# Patient Record
Sex: Male | Born: 1960
Health system: Southern US, Community
[De-identification: ages and names within clinical notes are randomized; demographics above are authoritative.]

## PROBLEM LIST (undated history)

## (undated) DIAGNOSIS — M1990 Unspecified osteoarthritis, unspecified site: Secondary | ICD-10-CM

## (undated) DIAGNOSIS — B20 Human immunodeficiency virus [HIV] disease: Secondary | ICD-10-CM

## (undated) DIAGNOSIS — Z21 Asymptomatic human immunodeficiency virus [HIV] infection status: Secondary | ICD-10-CM

## (undated) DIAGNOSIS — M25552 Pain in left hip: Secondary | ICD-10-CM

## (undated) DIAGNOSIS — E7800 Pure hypercholesterolemia, unspecified: Secondary | ICD-10-CM

## (undated) DIAGNOSIS — Z8739 Personal history of other diseases of the musculoskeletal system and connective tissue: Secondary | ICD-10-CM

## (undated) DIAGNOSIS — I2510 Atherosclerotic heart disease of native coronary artery without angina pectoris: Secondary | ICD-10-CM

## (undated) DIAGNOSIS — E119 Type 2 diabetes mellitus without complications: Secondary | ICD-10-CM

## (undated) DIAGNOSIS — I10 Essential (primary) hypertension: Secondary | ICD-10-CM

## (undated) DIAGNOSIS — D6489 Other specified anemias: Secondary | ICD-10-CM

## (undated) DIAGNOSIS — Z789 Other specified health status: Secondary | ICD-10-CM

## (undated) DIAGNOSIS — B1920 Unspecified viral hepatitis C without hepatic coma: Secondary | ICD-10-CM

## (undated) DIAGNOSIS — D649 Anemia, unspecified: Secondary | ICD-10-CM

## (undated) HISTORY — PX: OTHER SURGICAL HISTORY: SHX169

## (undated) HISTORY — DX: Unspecified viral hepatitis C without hepatic coma: B19.20

## (undated) HISTORY — DX: Essential (primary) hypertension: I10

## (undated) HISTORY — DX: Anemia, unspecified: D64.9

## (undated) HISTORY — DX: Asymptomatic human immunodeficiency virus (hiv) infection status: Z21

## (undated) HISTORY — DX: Human immunodeficiency virus (HIV) disease: B20

## (undated) HISTORY — DX: Atherosclerotic heart disease of native coronary artery without angina pectoris: I25.10

---
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
## 1996-11-06 ENCOUNTER — Encounter (INDEPENDENT_AMBULATORY_CARE_PROVIDER_SITE_OTHER): Payer: Self-pay | Admitting: *Deleted

## 1996-11-06 LAB — CONVERTED CEMR LAB: CD4 Count: 40 microliters

## 1997-09-30 ENCOUNTER — Encounter: Admission: RE | Admit: 1997-09-30 | Discharge: 1997-09-30 | Payer: Self-pay | Admitting: Infectious Diseases

## 1997-10-07 ENCOUNTER — Encounter: Admission: RE | Admit: 1997-10-07 | Discharge: 1997-10-07 | Payer: Self-pay | Admitting: Infectious Diseases

## 1997-11-25 ENCOUNTER — Encounter: Admission: RE | Admit: 1997-11-25 | Discharge: 1997-11-25 | Payer: Self-pay | Admitting: Infectious Diseases

## 1998-01-18 ENCOUNTER — Encounter: Admission: RE | Admit: 1998-01-18 | Discharge: 1998-01-18 | Payer: Self-pay | Admitting: Infectious Diseases

## 1998-01-26 ENCOUNTER — Emergency Department (HOSPITAL_COMMUNITY): Admission: EM | Admit: 1998-01-26 | Discharge: 1998-01-26 | Payer: Self-pay | Admitting: Emergency Medicine

## 1998-03-08 ENCOUNTER — Encounter: Admission: RE | Admit: 1998-03-08 | Discharge: 1998-03-08 | Payer: Self-pay | Admitting: Infectious Diseases

## 1998-05-04 ENCOUNTER — Encounter: Admission: RE | Admit: 1998-05-04 | Discharge: 1998-05-04 | Payer: Self-pay | Admitting: Infectious Diseases

## 1998-05-24 ENCOUNTER — Inpatient Hospital Stay (HOSPITAL_COMMUNITY): Admission: EM | Admit: 1998-05-24 | Discharge: 1998-05-29 | Payer: Self-pay | Admitting: Emergency Medicine

## 1998-05-24 ENCOUNTER — Encounter: Payer: Self-pay | Admitting: Emergency Medicine

## 1998-07-01 ENCOUNTER — Encounter: Admission: RE | Admit: 1998-07-01 | Discharge: 1998-07-01 | Payer: Self-pay | Admitting: Infectious Diseases

## 1998-08-24 ENCOUNTER — Encounter: Admission: RE | Admit: 1998-08-24 | Discharge: 1998-08-24 | Payer: Self-pay | Admitting: Infectious Diseases

## 1998-08-30 ENCOUNTER — Encounter: Admission: RE | Admit: 1998-08-30 | Discharge: 1998-08-30 | Payer: Self-pay | Admitting: Infectious Diseases

## 1998-10-21 ENCOUNTER — Encounter: Admission: RE | Admit: 1998-10-21 | Discharge: 1998-10-21 | Payer: Self-pay | Admitting: Internal Medicine

## 1998-10-21 ENCOUNTER — Encounter: Admission: RE | Admit: 1998-10-21 | Discharge: 1998-10-21 | Payer: Self-pay | Admitting: Infectious Diseases

## 1998-12-16 ENCOUNTER — Encounter: Admission: RE | Admit: 1998-12-16 | Discharge: 1998-12-16 | Payer: Self-pay | Admitting: Infectious Diseases

## 1999-02-07 ENCOUNTER — Encounter: Admission: RE | Admit: 1999-02-07 | Discharge: 1999-02-07 | Payer: Self-pay | Admitting: Infectious Diseases

## 1999-03-18 ENCOUNTER — Encounter: Admission: RE | Admit: 1999-03-18 | Discharge: 1999-03-18 | Payer: Self-pay | Admitting: Infectious Diseases

## 1999-04-04 ENCOUNTER — Encounter: Admission: RE | Admit: 1999-04-04 | Discharge: 1999-04-04 | Payer: Self-pay | Admitting: Infectious Diseases

## 1999-04-28 ENCOUNTER — Encounter: Admission: RE | Admit: 1999-04-28 | Discharge: 1999-04-28 | Payer: Self-pay | Admitting: Infectious Diseases

## 1999-05-30 ENCOUNTER — Encounter: Admission: RE | Admit: 1999-05-30 | Discharge: 1999-05-30 | Payer: Self-pay | Admitting: Infectious Diseases

## 1999-07-25 ENCOUNTER — Encounter: Admission: RE | Admit: 1999-07-25 | Discharge: 1999-07-25 | Payer: Self-pay | Admitting: Infectious Diseases

## 1999-09-26 ENCOUNTER — Encounter: Admission: RE | Admit: 1999-09-26 | Discharge: 1999-09-26 | Payer: Self-pay | Admitting: Infectious Diseases

## 1999-11-15 ENCOUNTER — Encounter: Admission: RE | Admit: 1999-11-15 | Discharge: 1999-11-15 | Payer: Self-pay | Admitting: Internal Medicine

## 2000-01-05 ENCOUNTER — Encounter: Admission: RE | Admit: 2000-01-05 | Discharge: 2000-01-05 | Payer: Self-pay | Admitting: Infectious Diseases

## 2000-01-05 ENCOUNTER — Ambulatory Visit (HOSPITAL_COMMUNITY): Admission: RE | Admit: 2000-01-05 | Discharge: 2000-01-05 | Payer: Self-pay | Admitting: Infectious Diseases

## 2000-01-05 ENCOUNTER — Encounter (INDEPENDENT_AMBULATORY_CARE_PROVIDER_SITE_OTHER): Payer: Self-pay | Admitting: *Deleted

## 2000-01-05 LAB — CONVERTED CEMR LAB
CD4 Count: 270 microliters
HIV 1 RNA Quant: 49 copies/mL

## 2000-08-13 ENCOUNTER — Encounter: Admission: RE | Admit: 2000-08-13 | Discharge: 2000-08-13 | Payer: Self-pay | Admitting: Infectious Diseases

## 2000-08-13 ENCOUNTER — Encounter (INDEPENDENT_AMBULATORY_CARE_PROVIDER_SITE_OTHER): Payer: Self-pay | Admitting: *Deleted

## 2000-08-13 ENCOUNTER — Ambulatory Visit (HOSPITAL_COMMUNITY): Admission: RE | Admit: 2000-08-13 | Discharge: 2000-08-13 | Payer: Self-pay | Admitting: Infectious Diseases

## 2000-08-13 LAB — CONVERTED CEMR LAB
CD4 Count: 380 microliters
HIV 1 RNA Quant: 400 copies/mL

## 2001-02-27 ENCOUNTER — Encounter: Payer: Self-pay | Admitting: Internal Medicine

## 2001-02-27 ENCOUNTER — Emergency Department (HOSPITAL_COMMUNITY): Admission: EM | Admit: 2001-02-27 | Discharge: 2001-02-27 | Payer: Self-pay | Admitting: Emergency Medicine

## 2001-04-08 ENCOUNTER — Emergency Department (HOSPITAL_COMMUNITY): Admission: EM | Admit: 2001-04-08 | Discharge: 2001-04-08 | Payer: Self-pay | Admitting: Emergency Medicine

## 2001-04-08 ENCOUNTER — Encounter: Payer: Self-pay | Admitting: Emergency Medicine

## 2002-11-27 ENCOUNTER — Other Ambulatory Visit: Admission: RE | Admit: 2002-11-27 | Discharge: 2002-11-27 | Payer: Self-pay | Admitting: Internal Medicine

## 2004-07-26 ENCOUNTER — Ambulatory Visit: Payer: Self-pay | Admitting: Internal Medicine

## 2004-07-27 ENCOUNTER — Ambulatory Visit: Payer: Self-pay | Admitting: Nurse Practitioner

## 2004-08-11 ENCOUNTER — Ambulatory Visit: Payer: Self-pay | Admitting: Internal Medicine

## 2004-08-22 ENCOUNTER — Ambulatory Visit: Payer: Self-pay | Admitting: Internal Medicine

## 2005-01-28 ENCOUNTER — Emergency Department (HOSPITAL_COMMUNITY): Admission: EM | Admit: 2005-01-28 | Discharge: 2005-01-28 | Payer: Self-pay | Admitting: Emergency Medicine

## 2005-02-21 ENCOUNTER — Emergency Department (HOSPITAL_COMMUNITY): Admission: EM | Admit: 2005-02-21 | Discharge: 2005-02-21 | Payer: Self-pay | Admitting: Family Medicine

## 2005-09-13 ENCOUNTER — Ambulatory Visit: Payer: Self-pay | Admitting: Internal Medicine

## 2005-09-13 ENCOUNTER — Encounter: Admission: RE | Admit: 2005-09-13 | Discharge: 2005-09-13 | Payer: Self-pay | Admitting: Internal Medicine

## 2005-09-13 ENCOUNTER — Ambulatory Visit (HOSPITAL_COMMUNITY): Admission: RE | Admit: 2005-09-13 | Discharge: 2005-09-13 | Payer: Self-pay | Admitting: Internal Medicine

## 2005-09-28 ENCOUNTER — Ambulatory Visit: Payer: Self-pay | Admitting: Internal Medicine

## 2006-08-01 ENCOUNTER — Emergency Department (HOSPITAL_COMMUNITY): Admission: EM | Admit: 2006-08-01 | Discharge: 2006-08-01 | Payer: Self-pay | Admitting: Emergency Medicine

## 2006-08-06 ENCOUNTER — Encounter: Admission: RE | Admit: 2006-08-06 | Discharge: 2006-08-06 | Payer: Self-pay | Admitting: Internal Medicine

## 2006-08-06 ENCOUNTER — Encounter (INDEPENDENT_AMBULATORY_CARE_PROVIDER_SITE_OTHER): Payer: Self-pay | Admitting: *Deleted

## 2006-08-06 ENCOUNTER — Ambulatory Visit: Payer: Self-pay | Admitting: Internal Medicine

## 2006-08-06 LAB — CONVERTED CEMR LAB
ALT: 60 units/L — ABNORMAL HIGH (ref 0–53)
Albumin: 4 g/dL (ref 3.5–5.2)
Alkaline Phosphatase: 83 units/L (ref 39–117)
Basophils Absolute: 0 10*3/uL (ref 0.0–0.1)
Bilirubin Urine: NEGATIVE
CD4 Count: 110 microliters
Eosinophils Absolute: 0.1 10*3/uL (ref 0.0–0.7)
GC Probe Amp, Urine: NEGATIVE
Glucose, Bld: 112 mg/dL — ABNORMAL HIGH (ref 70–99)
HCV Ab: POSITIVE — AB
HIV 1 RNA Quant: 49 copies/mL
Hemoglobin, Urine: NEGATIVE
Hep B Core Total Ab: POSITIVE — AB
Hep B S Ab: POSITIVE — AB
Hepatitis B Surface Ag: NEGATIVE
Ketones, ur: NEGATIVE mg/dL
LDL Cholesterol: 92 mg/dL (ref 0–99)
Lymphocytes Relative: 23 % (ref 12–46)
Lymphs Abs: 0.8 10*3/uL (ref 0.7–3.3)
MCV: 90 fL (ref 78.0–100.0)
Neutrophils Relative %: 65 % (ref 43–77)
Platelets: 264 10*3/uL (ref 150–400)
Potassium: 3.8 meq/L (ref 3.5–5.3)
Protein, ur: NEGATIVE mg/dL
Sodium: 140 meq/L (ref 135–145)
Total Protein: 7.3 g/dL (ref 6.0–8.3)
Triglycerides: 198 mg/dL — ABNORMAL HIGH (ref <150)
Urine Glucose: NEGATIVE mg/dL
Urobilinogen, UA: 0.2 (ref 0.0–1.0)
WBC: 3.7 10*3/uL — ABNORMAL LOW (ref 4.0–10.5)

## 2006-08-07 ENCOUNTER — Encounter: Payer: Self-pay | Admitting: Internal Medicine

## 2006-08-07 LAB — CONVERTED CEMR LAB: Hep A Total Ab: NEGATIVE

## 2006-08-09 DIAGNOSIS — B20 Human immunodeficiency virus [HIV] disease: Secondary | ICD-10-CM | POA: Insufficient documentation

## 2006-08-09 DIAGNOSIS — Z8619 Personal history of other infectious and parasitic diseases: Secondary | ICD-10-CM | POA: Insufficient documentation

## 2006-08-09 DIAGNOSIS — I10 Essential (primary) hypertension: Secondary | ICD-10-CM

## 2006-08-09 DIAGNOSIS — B1710 Acute hepatitis C without hepatic coma: Secondary | ICD-10-CM

## 2006-08-13 ENCOUNTER — Encounter (INDEPENDENT_AMBULATORY_CARE_PROVIDER_SITE_OTHER): Payer: Self-pay | Admitting: *Deleted

## 2006-08-13 LAB — CONVERTED CEMR LAB

## 2006-08-17 ENCOUNTER — Encounter: Payer: Self-pay | Admitting: Internal Medicine

## 2006-08-26 ENCOUNTER — Encounter (INDEPENDENT_AMBULATORY_CARE_PROVIDER_SITE_OTHER): Payer: Self-pay | Admitting: *Deleted

## 2006-09-10 ENCOUNTER — Telehealth: Payer: Self-pay | Admitting: Internal Medicine

## 2006-09-12 ENCOUNTER — Encounter (INDEPENDENT_AMBULATORY_CARE_PROVIDER_SITE_OTHER): Payer: Self-pay | Admitting: *Deleted

## 2006-09-12 ENCOUNTER — Ambulatory Visit: Payer: Self-pay | Admitting: Internal Medicine

## 2006-09-12 ENCOUNTER — Telehealth: Payer: Self-pay | Admitting: Internal Medicine

## 2006-09-12 DIAGNOSIS — F528 Other sexual dysfunction not due to a substance or known physiological condition: Secondary | ICD-10-CM

## 2006-09-25 ENCOUNTER — Telehealth: Payer: Self-pay | Admitting: Internal Medicine

## 2007-03-26 ENCOUNTER — Telehealth: Payer: Self-pay | Admitting: Infectious Diseases

## 2007-05-21 ENCOUNTER — Telehealth: Payer: Self-pay | Admitting: Internal Medicine

## 2007-06-18 ENCOUNTER — Encounter: Payer: Self-pay | Admitting: Internal Medicine

## 2007-06-19 ENCOUNTER — Encounter (INDEPENDENT_AMBULATORY_CARE_PROVIDER_SITE_OTHER): Payer: Self-pay | Admitting: *Deleted

## 2007-06-25 ENCOUNTER — Ambulatory Visit: Payer: Self-pay | Admitting: Internal Medicine

## 2007-06-25 ENCOUNTER — Encounter: Admission: RE | Admit: 2007-06-25 | Discharge: 2007-06-25 | Payer: Self-pay | Admitting: Internal Medicine

## 2007-06-25 LAB — CONVERTED CEMR LAB
AST: 38 units/L — ABNORMAL HIGH (ref 0–37)
Albumin: 4.5 g/dL (ref 3.5–5.2)
Alkaline Phosphatase: 72 units/L (ref 39–117)
BUN: 15 mg/dL (ref 6–23)
Basophils Relative: 1 % (ref 0–1)
Calcium: 9 mg/dL (ref 8.4–10.5)
Chloride: 104 meq/L (ref 96–112)
Creatinine, Ser: 1.05 mg/dL (ref 0.40–1.50)
Eosinophils Absolute: 0.1 10*3/uL (ref 0.0–0.7)
Glucose, Bld: 115 mg/dL — ABNORMAL HIGH (ref 70–99)
HDL: 57 mg/dL (ref >39)
HIV-1 RNA Quant, Log: <1.7 (ref <1.70)
Hemoglobin: 16.2 g/dL (ref 13.0–17.0)
Lymphs Abs: 0.9 10*3/uL (ref 0.7–4.0)
MCHC: 35.4 g/dL (ref 30.0–36.0)
MCV: 90.9 fL (ref 78.0–100.0)
Monocytes Absolute: 0.3 10*3/uL (ref 0.1–1.0)
Monocytes Relative: 10 % (ref 3–12)
Potassium: 4.1 meq/L (ref 3.5–5.3)
RBC: 5.04 M/uL (ref 4.22–5.81)
Testosterone: 956.07 ng/dL — ABNORMAL HIGH (ref 350–890)
Total CHOL/HDL Ratio: 3.5
Triglycerides: 265 mg/dL — ABNORMAL HIGH (ref <150)
WBC: 2.7 10*3/uL — ABNORMAL LOW (ref 4.0–10.5)

## 2007-07-17 ENCOUNTER — Ambulatory Visit: Payer: Self-pay | Admitting: Internal Medicine

## 2007-10-07 ENCOUNTER — Encounter (INDEPENDENT_AMBULATORY_CARE_PROVIDER_SITE_OTHER): Payer: Self-pay | Admitting: *Deleted

## 2007-10-18 ENCOUNTER — Encounter: Admission: RE | Admit: 2007-10-18 | Discharge: 2007-10-18 | Payer: Self-pay | Admitting: Internal Medicine

## 2007-10-18 ENCOUNTER — Ambulatory Visit: Payer: Self-pay | Admitting: Internal Medicine

## 2007-10-18 LAB — CONVERTED CEMR LAB
Alkaline Phosphatase: 82 units/L (ref 39–117)
BUN: 15 mg/dL (ref 6–23)
CO2: 24 meq/L (ref 19–32)
Cholesterol: 201 mg/dL — ABNORMAL HIGH (ref 0–200)
Creatinine, Ser: 1.07 mg/dL (ref 0.40–1.50)
Eosinophils Absolute: 0.2 10*3/uL (ref 0.0–0.7)
Eosinophils Relative: 4 % (ref 0–5)
Glucose, Bld: 158 mg/dL — ABNORMAL HIGH (ref 70–99)
HCT: 47.3 % (ref 39.0–52.0)
HDL: 62 mg/dL (ref >39)
Hemoglobin: 17.2 g/dL — ABNORMAL HIGH (ref 13.0–17.0)
Lymphocytes Relative: 28 % (ref 12–46)
Lymphs Abs: 1.1 10*3/uL (ref 0.7–4.0)
MCV: 88.9 fL (ref 78.0–100.0)
Monocytes Absolute: 0.4 10*3/uL (ref 0.1–1.0)
Monocytes Relative: 11 % (ref 3–12)
Total Bilirubin: 0.9 mg/dL (ref 0.3–1.2)
Total CHOL/HDL Ratio: 3.2
Total Protein: 7.8 g/dL (ref 6.0–8.3)
Triglycerides: 221 mg/dL — ABNORMAL HIGH (ref <150)
VLDL: 44 mg/dL — ABNORMAL HIGH (ref 0–40)
WBC: 3.8 10*3/uL — ABNORMAL LOW (ref 4.0–10.5)

## 2008-05-21 ENCOUNTER — Encounter (INDEPENDENT_AMBULATORY_CARE_PROVIDER_SITE_OTHER): Payer: Self-pay | Admitting: Licensed Clinical Social Worker

## 2008-05-21 ENCOUNTER — Ambulatory Visit: Payer: Self-pay | Admitting: Internal Medicine

## 2008-05-21 LAB — CONVERTED CEMR LAB
AST: 58 units/L — ABNORMAL HIGH (ref 0–37)
Alkaline Phosphatase: 61 units/L (ref 39–117)
BUN: 19 mg/dL (ref 6–23)
Basophils Relative: 1 % (ref 0–1)
Eosinophils Absolute: 0.1 10*3/uL (ref 0.0–0.7)
Eosinophils Relative: 3 % (ref 0–5)
Glucose, Bld: 121 mg/dL — ABNORMAL HIGH (ref 70–99)
HCT: 43.7 % (ref 39.0–52.0)
HIV-1 RNA Quant, Log: 2.15 — ABNORMAL HIGH (ref <1.68)
Lymphs Abs: 1.1 10*3/uL (ref 0.7–4.0)
MCHC: 33.6 g/dL (ref 30.0–36.0)
MCV: 93.4 fL (ref 78.0–100.0)
Monocytes Relative: 13 % — ABNORMAL HIGH (ref 3–12)
Platelets: 238 10*3/uL (ref 150–400)
Sodium: 139 meq/L (ref 135–145)
Total Bilirubin: 0.5 mg/dL (ref 0.3–1.2)
WBC: 3.2 10*3/uL — ABNORMAL LOW (ref 4.0–10.5)

## 2008-06-05 ENCOUNTER — Ambulatory Visit: Payer: Self-pay | Admitting: Internal Medicine

## 2008-06-24 ENCOUNTER — Telehealth (INDEPENDENT_AMBULATORY_CARE_PROVIDER_SITE_OTHER): Payer: Self-pay | Admitting: *Deleted

## 2008-09-09 ENCOUNTER — Ambulatory Visit: Payer: Self-pay | Admitting: Internal Medicine

## 2008-09-09 LAB — CONVERTED CEMR LAB
AST: 52 units/L — ABNORMAL HIGH (ref 0–37)
Albumin: 4.6 g/dL (ref 3.5–5.2)
BUN: 17 mg/dL (ref 6–23)
CO2: 24 meq/L (ref 19–32)
Calcium: 9.4 mg/dL (ref 8.4–10.5)
Chloride: 104 meq/L (ref 96–112)
Eosinophils Relative: 3 % (ref 0–5)
Glucose, Bld: 119 mg/dL — ABNORMAL HIGH (ref 70–99)
HCT: 44.2 % (ref 39.0–52.0)
HIV-1 RNA Quant, Log: 1.92 — ABNORMAL HIGH (ref <1.68)
Hemoglobin: 15.6 g/dL (ref 13.0–17.0)
Lymphocytes Relative: 37 % (ref 12–46)
Lymphs Abs: 1.2 10*3/uL (ref 0.7–4.0)
Monocytes Absolute: 0.4 10*3/uL (ref 0.1–1.0)
Monocytes Relative: 12 % (ref 3–12)
Potassium: 4.2 meq/L (ref 3.5–5.3)
WBC: 3.3 10*3/uL — ABNORMAL LOW (ref 4.0–10.5)

## 2008-09-10 ENCOUNTER — Encounter (INDEPENDENT_AMBULATORY_CARE_PROVIDER_SITE_OTHER): Payer: Self-pay | Admitting: *Deleted

## 2008-09-29 ENCOUNTER — Encounter (INDEPENDENT_AMBULATORY_CARE_PROVIDER_SITE_OTHER): Payer: Self-pay | Admitting: *Deleted

## 2008-10-16 ENCOUNTER — Telehealth (INDEPENDENT_AMBULATORY_CARE_PROVIDER_SITE_OTHER): Payer: Self-pay | Admitting: *Deleted

## 2008-10-22 ENCOUNTER — Telehealth (INDEPENDENT_AMBULATORY_CARE_PROVIDER_SITE_OTHER): Payer: Self-pay | Admitting: *Deleted

## 2008-12-29 ENCOUNTER — Telehealth (INDEPENDENT_AMBULATORY_CARE_PROVIDER_SITE_OTHER): Payer: Self-pay | Admitting: *Deleted

## 2009-01-04 ENCOUNTER — Telehealth (INDEPENDENT_AMBULATORY_CARE_PROVIDER_SITE_OTHER): Payer: Self-pay | Admitting: *Deleted

## 2009-01-27 ENCOUNTER — Ambulatory Visit: Payer: Self-pay | Admitting: Internal Medicine

## 2009-01-27 LAB — CONVERTED CEMR LAB
ALT: 55 units/L — ABNORMAL HIGH (ref 0–53)
AST: 47 units/L — ABNORMAL HIGH (ref 0–37)
Albumin: 4.4 g/dL (ref 3.5–5.2)
Alkaline Phosphatase: 78 units/L (ref 39–117)
Basophils Relative: 1 % (ref 0–1)
CO2: 23 meq/L (ref 19–32)
Chloride: 103 meq/L (ref 96–112)
Cholesterol: 194 mg/dL (ref 0–200)
Creatinine, Ser: 0.97 mg/dL (ref 0.40–1.50)
Eosinophils Absolute: 0 10*3/uL (ref 0.0–0.7)
HIV 1 RNA Quant: 117 copies/mL — ABNORMAL HIGH (ref <48)
Hemoglobin: 16.2 g/dL (ref 13.0–17.0)
LDL Cholesterol: 87 mg/dL (ref 0–99)
Lymphs Abs: 0.8 10*3/uL (ref 0.7–4.0)
MCV: 91.3 fL (ref >=78.0)
Neutrophils Relative %: 66 % (ref 43–77)
Platelets: 216 10*3/uL (ref 150–400)
RBC: 5.03 M/uL (ref 4.22–5.81)
Sodium: 139 meq/L (ref 135–145)
Total Bilirubin: 0.4 mg/dL (ref 0.3–1.2)
Total CHOL/HDL Ratio: 2.7
Total Protein: 7.8 g/dL (ref 6.0–8.3)
VLDL: 36 mg/dL (ref 0–40)
WBC: 3.2 10*3/uL — ABNORMAL LOW (ref 4.0–10.5)

## 2009-04-30 ENCOUNTER — Ambulatory Visit: Payer: Self-pay | Admitting: Internal Medicine

## 2009-04-30 LAB — CONVERTED CEMR LAB
ALT: 43 units/L (ref 0–53)
Alkaline Phosphatase: 63 units/L (ref 39–117)
Basophils Absolute: 0 10*3/uL (ref 0.0–0.1)
Basophils Relative: 1 % (ref 0–1)
Creatinine, Ser: 0.92 mg/dL (ref 0.40–1.50)
Eosinophils Absolute: 0.1 10*3/uL (ref 0.0–0.7)
Glucose, Bld: 115 mg/dL — ABNORMAL HIGH (ref 70–99)
HIV-1 RNA Quant, Log: 2.18 — ABNORMAL HIGH (ref <1.68)
MCHC: 34.1 g/dL (ref 30.0–36.0)
Neutro Abs: 1.3 10*3/uL — ABNORMAL LOW (ref 1.7–7.7)
Neutrophils Relative %: 44 % (ref 43–77)
RDW: 13.5 % (ref 11.5–15.5)
Sodium: 141 meq/L (ref 135–145)
Total Bilirubin: 0.5 mg/dL (ref 0.3–1.2)
Total Protein: 7 g/dL (ref 6.0–8.3)

## 2009-05-20 ENCOUNTER — Telehealth (INDEPENDENT_AMBULATORY_CARE_PROVIDER_SITE_OTHER): Payer: Self-pay | Admitting: *Deleted

## 2009-08-13 ENCOUNTER — Ambulatory Visit: Payer: Self-pay | Admitting: Internal Medicine

## 2009-08-13 LAB — CONVERTED CEMR LAB
Albumin: 4.4 g/dL (ref 3.5–5.2)
BUN: 16 mg/dL (ref 6–23)
Basophils Absolute: 0 10*3/uL (ref 0.0–0.1)
CO2: 22 meq/L (ref 19–32)
Calcium: 9.5 mg/dL (ref 8.4–10.5)
Chloride: 103 meq/L (ref 96–112)
Cholesterol: 177 mg/dL (ref 0–200)
Eosinophils Relative: 2 % (ref 0–5)
GC Probe Amp, Urine: NEGATIVE
Glucose, Bld: 166 mg/dL — ABNORMAL HIGH (ref 70–99)
HCT: 45.4 % (ref 39.0–52.0)
HDL: 43 mg/dL (ref >39)
HIV 1 RNA Quant: <48 copies/mL (ref <48)
Hemoglobin, Urine: NEGATIVE
Hemoglobin: 15.5 g/dL (ref 13.0–17.0)
LDL Cholesterol: 55 mg/dL (ref 0–99)
Leukocytes, UA: NEGATIVE
Lymphocytes Relative: 38 % (ref 12–46)
Monocytes Absolute: 0.3 10*3/uL (ref 0.1–1.0)
Monocytes Relative: 9 % (ref 3–12)
Neutro Abs: 1.4 10*3/uL — ABNORMAL LOW (ref 1.7–7.7)
Nitrite: NEGATIVE
Potassium: 4.1 meq/L (ref 3.5–5.3)
RBC: 4.94 M/uL (ref 4.22–5.81)
RDW: 14 % (ref 11.5–15.5)
Sodium: 138 meq/L (ref 135–145)
Total Protein: 7.2 g/dL (ref 6.0–8.3)
Triglycerides: 393 mg/dL — ABNORMAL HIGH (ref <150)
Urine Glucose: NEGATIVE mg/dL
pH: 5.5 (ref 5.0–8.0)

## 2009-08-25 ENCOUNTER — Encounter: Payer: Self-pay | Admitting: Internal Medicine

## 2009-08-25 ENCOUNTER — Ambulatory Visit: Payer: Self-pay | Admitting: Internal Medicine

## 2009-12-13 ENCOUNTER — Ambulatory Visit: Payer: Self-pay | Admitting: Internal Medicine

## 2009-12-13 ENCOUNTER — Encounter (INDEPENDENT_AMBULATORY_CARE_PROVIDER_SITE_OTHER): Payer: Self-pay | Admitting: *Deleted

## 2009-12-13 LAB — CONVERTED CEMR LAB
ALT: 35 U/L
AST: 35 U/L
Albumin: 4.1 g/dL
Alkaline Phosphatase: 82 U/L
BUN: 14 mg/dL
Basophils Absolute: 0 10*3/uL
Basophils Relative: 1 %
CO2: 25 meq/L
Calcium: 9.5 mg/dL
Chloride: 103 meq/L
Creatinine, Ser: 0.98 mg/dL
Eosinophils Absolute: 0.1 10*3/uL
Eosinophils Relative: 3 %
Glucose, Bld: 146 mg/dL — ABNORMAL HIGH
HCT: 44.9 %
HIV 1 RNA Quant: <48 {copies}/mL
HIV-1 RNA Quant, Log: <1.68
Hemoglobin: 15.9 g/dL
Lymphocytes Relative: 34 %
Lymphs Abs: 1.3 10*3/uL
MCHC: 35.4 g/dL
MCV: 89.6 fL
Monocytes Absolute: 0.4 10*3/uL
Monocytes Relative: 10 %
Neutro Abs: 2.1 10*3/uL
Neutrophils Relative %: 53 %
Platelets: 237 10*3/uL
Potassium: 4.2 meq/L
RBC: 5.01 M/uL
RDW: 13.7 %
Sodium: 139 meq/L
Total Bilirubin: 0.4 mg/dL
Total Protein: 6.8 g/dL
WBC: 3.9 10*3/uL — ABNORMAL LOW

## 2010-01-03 ENCOUNTER — Ambulatory Visit: Payer: Self-pay | Admitting: Internal Medicine

## 2010-01-03 ENCOUNTER — Encounter (INDEPENDENT_AMBULATORY_CARE_PROVIDER_SITE_OTHER): Payer: Self-pay | Admitting: *Deleted

## 2010-01-05 ENCOUNTER — Telehealth: Payer: Self-pay | Admitting: Internal Medicine

## 2010-01-17 ENCOUNTER — Telehealth: Payer: Self-pay | Admitting: Internal Medicine

## 2010-02-03 ENCOUNTER — Telehealth (INDEPENDENT_AMBULATORY_CARE_PROVIDER_SITE_OTHER): Payer: Self-pay | Admitting: *Deleted

## 2010-02-25 ENCOUNTER — Telehealth: Payer: Self-pay | Admitting: Internal Medicine

## 2010-03-28 ENCOUNTER — Telehealth (INDEPENDENT_AMBULATORY_CARE_PROVIDER_SITE_OTHER): Payer: Self-pay | Admitting: *Deleted

## 2010-04-11 ENCOUNTER — Ambulatory Visit: Payer: Self-pay | Admitting: Internal Medicine

## 2010-04-11 LAB — CONVERTED CEMR LAB
AST: 49 units/L — ABNORMAL HIGH (ref 0–37)
BUN: 12 mg/dL (ref 6–23)
Basophils Relative: 0 % (ref 0–1)
Calcium: 9.3 mg/dL (ref 8.4–10.5)
Chloride: 104 meq/L (ref 96–112)
Creatinine, Ser: 0.93 mg/dL (ref 0.40–1.50)
Eosinophils Absolute: 0.1 10*3/uL (ref 0.0–0.7)
Eosinophils Relative: 1 % (ref 0–5)
HCT: 43.4 % (ref 39.0–52.0)
HIV-1 RNA Quant, Log: <1.3 (ref <1.30)
Hemoglobin: 15.1 g/dL (ref 13.0–17.0)
MCHC: 34.8 g/dL (ref 30.0–36.0)
MCV: 90.4 fL (ref 78.0–100.0)
Monocytes Absolute: 0.4 10*3/uL (ref 0.1–1.0)
Monocytes Relative: 11 % (ref 3–12)
RBC: 4.8 M/uL (ref 4.22–5.81)
RDW: 14.9 % (ref 11.5–15.5)
Total Bilirubin: 0.5 mg/dL (ref 0.3–1.2)

## 2010-04-22 ENCOUNTER — Telehealth (INDEPENDENT_AMBULATORY_CARE_PROVIDER_SITE_OTHER): Payer: Self-pay | Admitting: *Deleted

## 2010-05-04 ENCOUNTER — Ambulatory Visit: Payer: Self-pay | Admitting: Internal Medicine

## 2010-05-10 ENCOUNTER — Encounter (INDEPENDENT_AMBULATORY_CARE_PROVIDER_SITE_OTHER): Payer: Self-pay | Admitting: *Deleted

## 2010-05-18 ENCOUNTER — Telehealth (INDEPENDENT_AMBULATORY_CARE_PROVIDER_SITE_OTHER): Payer: Self-pay | Admitting: *Deleted

## 2010-07-15 ENCOUNTER — Telehealth (INDEPENDENT_AMBULATORY_CARE_PROVIDER_SITE_OTHER): Payer: Self-pay | Admitting: *Deleted

## 2010-07-19 NOTE — Progress Notes (Signed)
Summary: Pt assist med arrived 3 month supply nxt reorder 03/06/10  Phone Note Refill Request      Prescriptions: NORVASC 10 MG TABS (AMLODIPINE BESYLATE) Take 1 tablet by mouth once a day  #90 x 0   Entered by:   Paulo Fruit  BS,CPht II,MPH   Authorized by:   Yisroel Ramming MD   Signed by:   Paulo Fruit  BS,CPht II,MPH on 01/17/2010   Method used:   Samples Given   RxID:   [id number]   Patient Assist Medication Verification: Medication: Norvasc 10mg  HYQ#[id number] Exp Date:04 16 Tech approval:MLD Call placed to patient with message that assistance medications are ready for pick-up. Paulo Fruit  BS,CPht II,MPH  January 17, 2010 11:14 AM

## 2010-07-19 NOTE — Progress Notes (Signed)
Summary: [facility] meds arrived for Aug  Phone Note Refill Request      Prescriptions: ATRIPLA 600-200-300 MG TABS (EFAVIRENZ-EMTRICITAB-TENOFOVIR) Take 1 tablet by mouth at bedtime  #30 x 0   Entered by:   Paulo Fruit  BS,CPht II,MPH   Authorized by:   Yisroel Ramming MD   Signed by:   Paulo Fruit  BS,CPht II,MPH on 02/03/2010   Method used:   Samples Given   RxID:   [id number] BACTRIM DS 800-160 MG TABS (SULFAMETHOXAZOLE-TRIMETHOPRIM) Take one tablet by mouth Monday, Wednesday and Friday  #12 x 0   Entered by:   Paulo Fruit  BS,CPht II,MPH   Authorized by:   Yisroel Ramming MD   Signed by:   Paulo Fruit  BS,CPht II,MPH on 02/03/2010   Method used:   Samples Given   RxID:   [id number]  Patient Assist Medication Verification: Medication name: Sulfameth/trimethoprim 800/160mg  RX # [id number] Tech approval:MLD  Patient Assist Medication Verification: Medication name:Atripla RX # [id number] Tech approval:MLD Call placed to patient with message that assistance medications are ready for pick-up. Paulo Fruit  BS,CPht II,MPH  February 03, 2010 3:35 PM

## 2010-07-19 NOTE — Assessment & Plan Note (Signed)
Summary: Office Visit (Infectious Disease)    CC:  f/u ov    No missed dose of HAART per pt .  History of Present Illness: Pt doing well.  No missed doses of his medication.  Preventive Screening-Counseling & Management  Alcohol-Tobacco     Alcohol drinks/day: 1 every other day      Alcohol type: beer     Smoking Status: never   Updated Prior Medication List: BACTRIM DS 800-160 MG TABS (SULFAMETHOXAZOLE-TRIMETHOPRIM) Take one tablet by mouth Monday, Wednesday and Friday ATRIPLA 600-200-300 MG TABS (EFAVIRENZ-EMTRICITAB-TENOFOVIR) Take 1 tablet by mouth at bedtime VIAGRA 50 MG TABS (SILDENAFIL CITRATE) as directed NORVASC 10 MG TABS (AMLODIPINE BESYLATE) Take 1 tablet by mouth once a day HYDROCHLOROTHIAZIDE 25 MG TABS (HYDROCHLOROTHIAZIDE) Take 1 tablet by mouth once a day  Current Allergies (reviewed today): No known allergies  Past History:  Past Medical History: Last updated: 08/09/2006 Hepatitis B, hx of Hepatitis C HIV disease Hypertension  Review of Systems  The patient denies anorexia, fever, and weight loss.    Vital Signs:  Patient profile:   50 year old male Height:      63 inches Weight:      175.1 pounds BMI:     31.13 BSA:     1.83 Temp:     97.9 degrees F oral Pulse rate:   88 / minute BP sitting:   166 / 98  (right arm)  Vitals Entered By: Tomasita Morrow RN (August 25, 2009 9:20 AM) CC: f/u ov    No missed dose of HAART per pt  Is Patient Diabetic? No Pain Assessment Patient in pain? no      Nutritional Status BMI of > 30 = obese Nutritional Status Detail normal  Have you ever been in a relationship where you felt threatened, hurt or afraid?No  Domestic Violence Intervention none  Does patient need assistance? Functional Status Self care Ambulation Normal   Physical Exam  General:  alert, well-developed, well-nourished, and well-hydrated.   Head:  normocephalic and atraumatic.   Mouth:  pharynx pink and moist.  no thrush  Lungs:   normal breath sounds.          Medication Adherence: 08/25/2009   Adherence to medications reviewed with patient. Counseling to provide adequate adherence provided   Prevention For Positives: 08/25/2009   Safe sex practices discussed with patient. Condoms offered.                             Impression & Recommendations:  Problem # 1:  HIV DISEASE (ICD-042) Pt.s most recent CD4ct was 210 and VL <48 .  Pt instructed to continue the current antiretroviral regimen.  Pt encouraged to take medication regularly and not miss doses.  Pt will f/u in 3 months for repeat blood work and will see me 2 weeks later. Hepatits A vaccine #2 given.  Diagnostics Reviewed:  CD4: 210 (08/13/2009)   WBC: 2.9 (08/13/2009)   Hgb: 15.5 (08/13/2009)   HCT: 45.4 (08/13/2009)   Platelets: 239 (08/13/2009) HIV-1 RNA: <48 copies/mL (08/13/2009)   HBSAg: No (08/13/2006)  Problem # 2:  HYPERTENSION (ICD-401.9) will add HCTZ. His updated medication list for this problem includes:    Norvasc 10 Mg Tabs (Amlodipine besylate) .[name]... Take 1 tablet by mouth once a day    Hydrochlorothiazide 25 Mg Tabs (Hydrochlorothiazide) .[name]... Take 1 tablet by mouth once a day  Medications Added to Medication List This Visit:  1)  Hydrochlorothiazide 25 Mg Tabs (Hydrochlorothiazide) .... Take 1 tablet by mouth once a day  Other Orders: Est. Patient Level III (16109) Future Orders: T-CD4SP ([facility]) (CD4SP) ... 11/23/2009 T-HIV Viral Load [phone]) ... 11/23/2009 T-Comprehensive Metabolic Panel [phone]) ... 11/23/2009 T-CBC w/Diff ([id number]) ... 11/23/2009  Patient Instructions: 1)  Please schedule a follow-up appointment in 3 months, 2 weeks after labs.  Prescriptions: HYDROCHLOROTHIAZIDE 25 MG TABS (HYDROCHLOROTHIAZIDE) Take 1 tablet by mouth once a day  #30 x 5   Entered and Authorized by:   Yisroel Ramming MD   Signed by:   Yisroel Ramming MD on 08/25/2009   Method used:   Print then Give to Patient   RxID:    [id number]  Process Orders Check Orders Results:     [facility]: ABN not required for this insurance Tests Sent for requisitioning (August 25, 2009 2:33 PM):     11/23/2009: [facility] -- T-HIV Viral Load [phone] (signed)     11/23/2009: [facility] -- T-Comprehensive Metabolic Panel [[id number]] (signed)     11/23/2009: [facility] -- T-CBC w/Diff [[id number]] (signed)         Medication Adherence: 08/25/2009   Adherence to medications reviewed with patient. Counseling to provide adequate adherence provided    Prevention For Positives: 08/25/2009   Safe sex practices discussed with patient. Condoms offered.

## 2010-07-19 NOTE — Progress Notes (Signed)
Summary: [facility] meds arrived for Sept  Phone Note Refill Request      Prescriptions: ATRIPLA 600-200-300 MG TABS (EFAVIRENZ-EMTRICITAB-TENOFOVIR) Take 1 tablet by mouth at bedtime  #30 x 0   Entered by:   Paulo Fruit  BS,CPht II,MPH   Authorized by:   Yisroel Ramming MD   Signed by:   Paulo Fruit  BS,CPht II,MPH on 02/25/2010   Method used:   Samples Given   RxID:   [id number] BACTRIM DS 800-160 MG TABS (SULFAMETHOXAZOLE-TRIMETHOPRIM) Take one tablet by mouth Monday, Wednesday and Friday  #12 x 0   Entered by:   Paulo Fruit  BS,CPht II,MPH   Authorized by:   Yisroel Ramming MD   Signed by:   Paulo Fruit  BS,CPht II,MPH on 02/25/2010   Method used:   Samples Given   RxID:   [id number]  Patient Assist Medication Verification: Medication name: sulfameth/trimethoprim 800/160mg  RX # [id number] Tech approval:MLD  Patient Assist Medication Verification: Medication name:Atripla RX # [id number] Tech approval:MLD Call placed to patient with message that assistance medications are ready for pick-up. patient said he will find a ride up to office to pick up today or next week Paulo Fruit  BS,CPht II,MPH  February 25, 2010 1:29 PM   Appended Document: [facility]cadap meds arrived for Sept Prescription/Samples picked up by: patient He also asked for a bag of condoms in which was given. mld

## 2010-07-19 NOTE — Assessment & Plan Note (Signed)
Summary: F/U/VS   CC:  follow-up visit and lab results.  History of Present Illness: Pt doing well. Concerned about his weight gain.  Preventive Screening-Counseling & Management  Alcohol-Tobacco     Alcohol drinks/day: 1 every other day      Alcohol type: beer     Smoking Status: never  Caffeine-Diet-Exercise     Caffeine use/day: tea occasional     Does Patient Exercise: no  Hep-HIV-STD-Contraception     HIV Risk: no risk noted  Safety-Violence-Falls     Seat Belt Use: yes      Sexual History:  currently monogamous.        Drug Use:  former.        Blood Transfusions:  no.        Travel History:  none.    Comments: pt. given condoms   Updated Prior Medication List: BACTRIM DS 800-160 MG TABS (SULFAMETHOXAZOLE-TRIMETHOPRIM) Take one tablet by mouth Monday, Wednesday and Friday ATRIPLA 600-200-300 MG TABS (EFAVIRENZ-EMTRICITAB-TENOFOVIR) Take 1 tablet by mouth at bedtime VIAGRA 50 MG TABS (SILDENAFIL CITRATE) as directed NORVASC 10 MG TABS (AMLODIPINE BESYLATE) Take 1 tablet by mouth once a day  Current Allergies (reviewed today): No known allergies  Past History:  Past Medical History: Last updated: 08/09/2006 Hepatitis B, hx of Hepatitis C HIV disease Hypertension  Review of Systems  The patient denies anorexia, fever, and weight loss.    Vital Signs:  Patient profile:   50 year old male Height:      63 inches (160.02 cm) Weight:      180.8 pounds (82.18 kg) BMI:     32.14 Temp:     98.5 degrees F (36.94 degrees C) oral Pulse rate:   87 / minute BP sitting:   158 / 97  (right arm)  Vitals Entered By: Wendall Mola CMA Duncan Dull) (May 04, 2010 9:02 AM) CC: follow-up visit, lab results Is Patient Diabetic? No Pain Assessment Patient in pain? no      Nutritional Status BMI of > 30 = obese Nutritional Status Detail appetite "good"  Have you ever been in a relationship where you felt threatened, hurt or afraid?No   Does  patient need assistance? Functional Status Self care Ambulation Normal Comments no missed doses of meds per pt.   Physical Exam  General:  alert, well-developed, well-nourished, and well-hydrated.   Head:  normocephalic and atraumatic.   Mouth:  pharynx pink and moist.   Lungs:  normal breath sounds.     Impression & Recommendations:  Problem # 1:  HIV DISEASE (ICD-042) Pt.s most recent CD4ct was 230 and VL <20 .  Pt instructed to continue the current antiretroviral regimen.  Pt encouraged to take medication regularly and not miss doses.  Pt will f/u in 3 months for repeat blood work and will see me 2 weeks later.  His updated medication list for this problem includes:    Bactrim Ds 800-160 Mg Tabs (Sulfamethoxazole-trimethoprim) .[name]... Take one tablet by mouth monday, wednesday and friday  Diagnostics Reviewed:  HIV: CDC-defined AIDS (08/25/2009)   CD4: 230 (04/12/2010)   WBC: 3.6 (04/11/2010)   Hgb: 15.1 (04/11/2010)   HCT: 43.4 (04/11/2010)   Platelets: 212 (04/11/2010) HIV-1 RNA: <20 copies/mL (04/11/2010)   HBSAg: No (08/13/2006)  Problem # 2:  HYPERTENSION (ICD-401.9) exercise and diet discussed. The following medications were removed from the medication list:    Hydrochlorothiazide 25 Mg Tabs (Hydrochlorothiazide) .[name]... Take 1 tablet by mouth once a day His updated  medication list for this problem includes:    Norvasc 10 Mg Tabs (Amlodipine besylate) .[name]... Take 1 tablet by mouth once a day  Other Orders: Est. Patient Level III (16109) Future Orders: T-CD4SP ([facility]) (CD4SP) ... 08/02/2010 T-HIV Viral Load [phone]) ... 08/02/2010 T-Comprehensive Metabolic Panel [phone]) ... 08/02/2010 T-CBC w/Diff ([id number]) ... 08/02/2010  Patient Instructions: 1)  Please schedule a follow-up appointment in 3 months, 2 weeks after labs.     Appended Document: F/U/VS    Clinical Lists Changes  Orders: Added new Service order of Influenza Vaccine NON MCR  (96295) - Signed Observations: Added new observation of FLU VAX#1VIS: 01/11/10 version given May 04, 2010. (05/04/2010 9:28) Added new observation of FLU VAXLOT: 1103 3P (05/04/2010 9:28) Added new observation of FLU VAX EXP: 09/18/2010 (05/04/2010 9:28) Added new observation of FLU VAXBY: Wendall Mola CMA ( AAMA) (05/04/2010 9:28) Added new observation of FLU VAXRTE: IM (05/04/2010 9:28) Added new observation of FLU VAX DSE: 0.5 ml (05/04/2010 9:28) Added new observation of FLU VAXMFR: Novartis (05/04/2010 9:28) Added new observation of FLU VAX SITE: right deltoid (05/04/2010 9:28) Added new observation of FLU VAX: Fluvax Non-MCR (05/04/2010 9:28)       Immunizations Administered:  Influenza Vaccine # 1:    Vaccine Type: Fluvax Non-MCR    Site: right deltoid    Mfr: Novartis    Dose: 0.5 ml    Route: IM    Given by: Wendall Mola CMA ( AAMA)    Exp. Date: 09/18/2010    Lot #: [id number]    VIS given: 01/11/10 version given May 04, 2010.  Flu Vaccine Consent Questions:    Do you have a history of severe allergic reactions to this vaccine? no    Any prior history of allergic reactions to egg and/or gelatin? no    Do you have a sensitivity to the preservative Thimersol? no    Do you have a past history of Guillan-Barre Syndrome? no    Do you currently have an acute febrile illness? no    Have you ever had a severe reaction to latex? no    Vaccine information given and explained to patient? yes

## 2010-07-19 NOTE — Miscellaneous (Signed)
Summary: clinical update/ryan white  Clinical Lists Changes  Observations: Added new observation of INCOMESOURCE: unemployment (01/03/2010 16:05) Added new observation of PCTFPL: 65.78  (01/03/2010 16:05) Added new observation of HOUSEINCOME: 7124  (01/03/2010 16:05) Added new observation of FINASSESSDT: 01/03/2010  (01/03/2010 16:05)

## 2010-07-19 NOTE — Miscellaneous (Signed)
  Clinical Lists Changes  Observations: Added new observation of YEARAIDSPOS: 1996  (05/10/2010 16:20)

## 2010-07-19 NOTE — Miscellaneous (Signed)
Summary: Orders Update  Clinical Lists Changes  Problems: Added new problem of ENCOUNTER FOR LONG-TERM USE OF OTHER MEDICATIONS (ICD-V58.69) Orders: Added new Test order of T-CBC w/Diff [phone]) - Signed Added new Test order of T-CD4SP [facility]) (CD4SP) - Signed Added new Test order of T-Chlamydia  Probe, urine [phone]) - Signed Added new Test order of T-GC Probe, urine [phone]) - Signed Added new Test order of T-Comprehensive Metabolic Panel [phone]) - Signed Added new Test order of T-HIV Viral Load [phone]) - Signed Added new Test order of T-Lipid Profile ([id number]) - Signed Added new Test order of T-Urinalysis ([id number]) - Signed Added new Test order of T-RPR (Syphilis) ([id number]) - Signed     Process Orders Check Orders Results:     [facility]: ABN not required for this insurance Tests Sent for requisitioning (August 13, 2009 3:56 PM):     08/13/2009: [facility] -- T-CBC w/Diff [[id number]] (signed)     08/13/2009: [facility] -- [employer], urine [phone] (signed)     08/13/2009: [facility] -- T-GC Probe, urine [phone] (signed)     08/13/2009: [facility] -- T-Comprehensive Metabolic Panel [[id number]] (signed)     08/13/2009: [facility] -- T-HIV Viral Load [phone] (signed)     08/13/2009: [facility] -- T-Lipid Profile [phone] (signed)     08/13/2009: [facility] -- T-Urinalysis [[id number]] (signed)     08/13/2009: [facility] -- T-RPR (Syphilis) [phone] (signed)

## 2010-07-19 NOTE — Assessment & Plan Note (Signed)
Summary: F/U/VS   CC:  follow-up visit, lab results, and B/P elevated.  History of Present Illness: Pt here for f/u.  He has been out of his Atripla for 15 days.  He did not get his shipment from [facility]. He has also been wihtout his norvasc and HCTZ.  Apparently he was enrolled in a [facility] program for his norvasc but did not notify Byrd Hesselbach that he needed a refill.  He does c/o of occasional HAs.   Preventive Screening-Counseling & Management  Alcohol-Tobacco     Alcohol drinks/day: 1 every other day      Alcohol type: beer     Smoking Status: never  Caffeine-Diet-Exercise     Caffeine use/day: tea occasional     Does Patient Exercise: yes  Hep-HIV-STD-Contraception     HIV Risk: no risk noted  Safety-Violence-Falls     Seat Belt Use: yes      Sexual History:  currently monogamous.        Drug Use:  former.        Blood Transfusions:  no.        Travel History:  none.    Comments: pt. given condoms   Updated Prior Medication List: BACTRIM DS 800-160 MG TABS (SULFAMETHOXAZOLE-TRIMETHOPRIM) Take one tablet by mouth Monday, Wednesday and Friday ATRIPLA 600-200-300 MG TABS (EFAVIRENZ-EMTRICITAB-TENOFOVIR) Take 1 tablet by mouth at bedtime VIAGRA 50 MG TABS (SILDENAFIL CITRATE) as directed NORVASC 10 MG TABS (AMLODIPINE BESYLATE) Take 1 tablet by mouth once a day HYDROCHLOROTHIAZIDE 25 MG TABS (HYDROCHLOROTHIAZIDE) Take 1 tablet by mouth once a day  Current Allergies (reviewed today): No known allergies  Past History:  Past Medical History: Last updated: 08/09/2006 Hepatitis B, hx of Hepatitis C HIV disease Hypertension  Review of Systems       The patient complains of headaches.  The patient denies anorexia, fever, weight loss, and chest pain.    Vital Signs:  Patient profile:   50 year old male Height:      63 inches (160.02 cm) Weight:      177.4 pounds (80.64 kg) BMI:     31.54 Temp:     98.6 degrees F (37.00 degrees C) oral Pulse rate:   107 / minute BP  sitting:   180 / 101  (right arm)  Vitals Entered By: Wendall Mola CMA Duncan Dull) (January 03, 2010 3:19 PM) CC: follow-up visit, lab results, B/P elevated Is Patient Diabetic? No Pain Assessment Patient in pain? no      Nutritional Status BMI of > 30 = obese Nutritional Status Detail appetite "not good"  Does patient need assistance? Functional Status Self care Ambulation Normal Comments Pt. has been without medications for two weeks   Physical Exam  General:  alert, well-developed, well-nourished, and well-hydrated.   Head:  normocephalic and atraumatic.   Mouth:  pharynx pink and moist.   Lungs:  normal breath sounds.   Heart:  normal rate, regular rhythm, and no murmur.      Impression & Recommendations:  Problem # 1:  HIV DISEASE (ICD-042) Pt.s most recent CD4ct was 230 and VL <48 .  Pt instructed to continue the current antiretroviral regimen. Will refer to Byrd Hesselbach to make sure he can get back on his meds. Pt encouraged to take medication regularly and not miss doses.  Pt will f/u in 3 months for repeat blood work and will see me 2 weeks later.  Diagnostics Reviewed:  HIV: CDC-defined AIDS (08/25/2009)   CD4: 230 (12/14/2009)  WBC: 3.9 (12/13/2009)   Hgb: 15.9 (12/13/2009)   HCT: 44.9 (12/13/2009)   Platelets: 237 (12/13/2009) HIV-1 RNA: <48 copies/mL (12/13/2009)   HBSAg: No (08/13/2006)  Problem # 2:  HYPERTENSION (ICD-401.9) will need to re-start his meds will treat with atenolol/HCTZ until norvasc comes in explained this to patient. His updated medication list for this problem includes:    Norvasc 10 Mg Tabs (Amlodipine besylate) .[name]... Take 1 tablet by mouth once a day    Hydrochlorothiazide 25 Mg Tabs (Hydrochlorothiazide) .[name]... Take 1 tablet by mouth once a day  Problem # 3:  HYPERGLYCEMIA (ICD-790.29) will check HgbA1c Orders: T-Hgb A1C (in-house) (56213YQ)  Other Orders: Est. Patient Level IV (65784) Future Orders: T-CD4SP ([facility]) (CD4SP) ...  04/03/2010 T-HIV Viral Load [phone]) ... 04/03/2010 T-Comprehensive Metabolic Panel [phone]) ... 04/03/2010 T-CBC w/Diff ([id number]) ... 04/03/2010 T-Hepatitis A Antibody ([id number]) ... 04/03/2010  Patient Instructions: 1)  Please schedule a follow-up appointment in 3 months, 2 weeks after labs.  Prescriptions: HYDROCHLOROTHIAZIDE 25 MG TABS (HYDROCHLOROTHIAZIDE) Take 1 tablet by mouth once a day  #30 x 5   Entered and Authorized by:   Yisroel Ramming MD   Signed by:   Yisroel Ramming MD on 01/03/2010   Method used:   Print then Give to Patient   RxID:   [id number] NORVASC 10 MG TABS (AMLODIPINE BESYLATE) Take 1 tablet by mouth once a day  #90 x 0   Entered and Authorized by:   Yisroel Ramming MD   Signed by:   Yisroel Ramming MD on 01/03/2010   Method used:   Print then Give to Patient   RxID:   [id number]   Appended Document: F/U/VS  Laboratory Results   Blood Tests   Date/Time Received: Mariea Clonts  January 03, 2010 4:25 PM  Date/Time Reported: Mariea Clonts  January 03, 2010 4:25 PM   HGBA1C: 5.6%   (Normal Range: Non-Diabetic - 3-6%   Control Diabetic - 6-8%)

## 2010-07-19 NOTE — Progress Notes (Signed)
Summary: [facility] Atripla & Omperazole  Phone Note Outgoing Call   Summary of Call: [facility] Atripla & Omperazole Medication(s) have arrived.  Left message or spoke to patient advising them they were ready for pickup.   Initial call taken by: Altamease Oiler,  March 28, 2010 4:51 PM     Appended Document: [facility] Atripla & Omperazole Pt. picked up rxes.

## 2010-07-19 NOTE — Miscellaneous (Signed)
Summary: clinical update/ryan white [facility] apprv til 09/17/10  Clinical Lists Changes  Observations: Added new observation of [facility]: Yes 2011 (12/13/2009 9:54)

## 2010-07-19 NOTE — Progress Notes (Signed)
Summary: [facility]/pt assist meds arrived for Jul  Phone Note Refill Request      Prescriptions: ATRIPLA 600-200-300 MG TABS (EFAVIRENZ-EMTRICITAB-TENOFOVIR) Take 1 tablet by mouth at bedtime  #30 x 0   Entered by:   Paulo Fruit  BS,CPht II,MPH   Authorized by:   Yisroel Ramming MD   Signed by:   Paulo Fruit  BS,CPht II,MPH on 01/05/2010   Method used:   Samples Given   RxID:   [id number] BACTRIM DS 800-160 MG TABS (SULFAMETHOXAZOLE-TRIMETHOPRIM) Take one tablet by mouth Monday, Wednesday and Friday  #12 x 0   Entered by:   Paulo Fruit  BS,CPht II,MPH   Authorized by:   Yisroel Ramming MD   Signed by:   Paulo Fruit  BS,CPht II,MPH on 01/05/2010   Method used:   Samples Given   RxID:   [id number]  Patient Assist Medication Verification: Medication name: Sulfameth/Trimethoprim 800/160mg  RX # [id number] Tech approval:MLD  Patient Assist Medication Verification: Medication name:Atripla RX # [id number] Tech approval:MLD Call placed to patient with message that assistance medications are ready for pick-up. Paulo Fruit  BS,CPht II,MPH  January 05, 2010 2:23 PM

## 2010-07-19 NOTE — Progress Notes (Signed)
Summary: [facility] rxes arrived.  Phone Note Outgoing Call   Call placed by: Jennet Maduro RN,  April 22, 2010 3:23 PM Call placed to: Patient Action Taken: Assistance medications ready for pick up Summary of Call: [facility] rxes, Atripla & Bactrim, arrived.  Message left for pt. to pick up. Jennet Maduro RN  April 22, 2010 3:25 PM

## 2010-07-21 NOTE — Progress Notes (Addendum)
Summary: [facility] rxes arrived, needs to transfer to [address] [address] [facility]  Phone Note Outgoing Call   Call placed by: Jennet Maduro RN,  July 15, 2010 2:28 PM Call placed to: Patient Action Taken: Assistance medications ready for pick up Summary of Call:  [facility] rxes arrived.  Pt. needs to transfer rx to [address] Rushie Chestnut.  .sign Initial call taken by: Jennet Maduro RN,  July 15, 2010 2:29 PM    Prescriptions: ATRIPLA 600-200-300 MG TABS (EFAVIRENZ-EMTRICITAB-TENOFOVIR) Take 1 tablet by mouth at bedtime.  #31 x 0   Entered by:   Jennet Maduro RN   Authorized by:   Johny Sax MD   Signed by:   Jennet Maduro RN on 07/15/2010   Method used:   Samples Given   RxID:   [id number] BACTRIM DS 800-160 MG TABS (SULFAMETHOXAZOLE-TRIMETHOPRIM) Take one tablet by mouth Monday, Wednesday and Friday  #15 x 5   Entered by:   Jennet Maduro RN   Authorized by:   Johny Sax MD   Signed by:   Jennet Maduro RN on 07/15/2010   Method used:   Samples Given   RxID:   [id number]

## 2010-07-21 NOTE — Progress Notes (Signed)
Summary: ADAP rxes ready for pickup  Phone Note Outgoing Call   Call placed by: Jennet Maduro RN,  May 18, 2010 4:53 PM Call placed to: Patient Action Taken: Assistance medications ready for pick up Summary of Call: ADAP rxes ready for pick up.  Pt. notified. Jennet Maduro RN  May 18, 2010 4:54 PM      Prescriptions: ATRIPLA 600-200-300 MG TABS (EFAVIRENZ-EMTRICITAB-TENOFOVIR) Take 1 tablet by mouth at bedtime  #30 x 0   Entered by:   Jennet Maduro RN   Authorized by:   Yisroel Ramming MD   Signed by:   Jennet Maduro RN on 05/18/2010   Method used:   Samples Given   RxID:   [id number] BACTRIM DS 800-160 MG TABS (SULFAMETHOXAZOLE-TRIMETHOPRIM) Take one tablet by mouth Monday, Wednesday and Friday  #12 x 0   Entered by:   Jennet Maduro RN   Authorized by:   Yisroel Ramming MD   Signed by:   Jennet Maduro RN on 05/18/2010   Method used:   Samples Given   RxID:   [id number]   Appended Document: ADAP rxes ready for pickup pt. picked up ADAP meds

## 2010-08-22 ENCOUNTER — Telehealth (INDEPENDENT_AMBULATORY_CARE_PROVIDER_SITE_OTHER): Payer: Self-pay | Admitting: *Deleted

## 2010-08-22 ENCOUNTER — Other Ambulatory Visit: Payer: Self-pay | Admitting: Infectious Diseases

## 2010-08-22 ENCOUNTER — Encounter: Payer: Self-pay | Admitting: Infectious Diseases

## 2010-08-22 ENCOUNTER — Other Ambulatory Visit (INDEPENDENT_AMBULATORY_CARE_PROVIDER_SITE_OTHER): Payer: Self-pay

## 2010-08-22 DIAGNOSIS — B20 Human immunodeficiency virus [HIV] disease: Secondary | ICD-10-CM

## 2010-08-22 LAB — CONVERTED CEMR LAB
AST: 39 units/L — ABNORMAL HIGH (ref 0–37)
Albumin: 4.7 g/dL (ref 3.5–5.2)
Alkaline Phosphatase: 78 units/L (ref 39–117)
Basophils Absolute: 0 10*3/uL (ref 0.0–0.1)
Basophils Relative: 1 % (ref 0–1)
Eosinophils Absolute: 0.1 10*3/uL (ref 0.0–0.7)
Hemoglobin: 16.1 g/dL (ref 13.0–17.0)
MCHC: 35.2 g/dL (ref 30.0–36.0)
MCV: 89.6 fL (ref 78.0–100.0)
Monocytes Absolute: 0.4 10*3/uL (ref 0.1–1.0)
Neutro Abs: 1.7 10*3/uL (ref 1.7–7.7)
Neutrophils Relative %: 49 % (ref 43–77)
Potassium: 4.3 meq/L (ref 3.5–5.3)
RDW: 13.7 % (ref 11.5–15.5)
Sodium: 138 meq/L (ref 135–145)
Total Protein: 7.6 g/dL (ref 6.0–8.3)

## 2010-08-23 LAB — T-HELPER CELL (CD4) - (RCID CLINIC ONLY): CD4 T Cell Abs: 270 uL — ABNORMAL LOW (ref 400–2700)

## 2010-08-24 ENCOUNTER — Telehealth: Payer: Self-pay | Admitting: Infectious Diseases

## 2010-08-24 ENCOUNTER — Encounter (INDEPENDENT_AMBULATORY_CARE_PROVIDER_SITE_OTHER): Payer: Self-pay | Admitting: *Deleted

## 2010-08-29 ENCOUNTER — Encounter: Payer: Self-pay | Admitting: Adult Health

## 2010-08-30 NOTE — Progress Notes (Addendum)
Summary: Pt. needing changed from Norvasc to $4 plan B/P rx, not HTCZ  Phone Note Call from Patient Call back at Home Phone [phone]   Caller: Patient Call For: John Sax, MD Reason for Call: Talk to Nurse Summary of Call: Pt. has lost his medical insurance.  Unable to afford $21.72 monthly co-pay for the Norvasc.  Pt. is wondering whether there is another B/P medication that he could obtain on the [facility] $4 list.  Previously took HCTZ which was not working.  Please advise.  John Maduro RN  August 24, 2010 4:17 PM   Follow-up for Phone Call        per Dr. Ninetta Dickerson, Atenolol 25 mg daily.  John Maduro RN  August 24, 2010 5:34 PM     New/Updated Medications: ATENOLOL 25 MG TABS (ATENOLOL) Take 1 tablet by mouth once a day Prescriptions: ATENOLOL 25 MG TABS (ATENOLOL) Take 1 tablet by mouth once a day  #31 x 6   Entered by:   John Maduro RN   Authorized by:   John Sax MD   Signed by:   John Maduro RN on 08/24/2010   Method used:   Electronically to        [facility] [id number]* (retail)       [address]       [place], [place]  [address]       Ph: [phone]       Fax: [phone]   RxID:   [id number]

## 2010-08-30 NOTE — Progress Notes (Signed)
Summary: refill  Phone Note Call from Patient Call back at Home Phone [phone]   Caller: Patient Reason for Call: Refill Medication    Prescriptions: NORVASC 10 MG TABS (AMLODIPINE BESYLATE) Take 1 tablet by mouth once a day  #90 x 3   Entered by:   Jennet Maduro RN   Authorized by:   Johny Sax MD   Signed by:   Jennet Maduro RN on 08/22/2010   Method used:   Electronically to        [facility] [id number]* (retail)       [address]       [place], [place]  [address]       Ph: [phone]       Fax: [phone]   RxID:   [id number]

## 2010-08-30 NOTE — Miscellaneous (Signed)
Summary: adap approved til 03/19/11 - thp  Clinical Lists Changes  Observations: Added new observation of AIDSDAP: Yes 2012 (08/24/2010 14:36)

## 2010-09-02 ENCOUNTER — Ambulatory Visit: Payer: Self-pay | Admitting: Infectious Diseases

## 2010-09-04 LAB — T-HELPER CELL (CD4) - (RCID CLINIC ONLY): CD4 T Cell Abs: 230 uL — ABNORMAL LOW (ref 400–2700)

## 2010-09-05 ENCOUNTER — Ambulatory Visit: Payer: Self-pay | Admitting: Infectious Diseases

## 2010-09-06 NOTE — Miscellaneous (Signed)
Summary: [place] [facility]  [place] [facility]   Imported By: Florinda Marker 08/29/2010 16:06:15  _____________________________________________________________________  External Attachment:    Type:   Image     Comment:   External Document

## 2010-09-21 LAB — T-HELPER CELL (CD4) - (RCID CLINIC ONLY): CD4 T Cell Abs: 200 uL — ABNORMAL LOW (ref 400–2700)

## 2010-09-24 LAB — T-HELPER CELL (CD4) - (RCID CLINIC ONLY)
CD4 % Helper T Cell: 19 % — ABNORMAL LOW (ref 33–55)
CD4 T Cell Abs: 140 uL — ABNORMAL LOW (ref 400–2700)

## 2010-09-29 LAB — T-HELPER CELL (CD4) - (RCID CLINIC ONLY): CD4 T Cell Abs: 440 uL (ref 400–2700)

## 2010-10-03 ENCOUNTER — Encounter: Payer: Self-pay | Admitting: Infectious Diseases

## 2010-10-03 ENCOUNTER — Ambulatory Visit (INDEPENDENT_AMBULATORY_CARE_PROVIDER_SITE_OTHER): Payer: Self-pay | Admitting: Infectious Diseases

## 2010-10-03 DIAGNOSIS — B1710 Acute hepatitis C without hepatic coma: Secondary | ICD-10-CM

## 2010-10-03 DIAGNOSIS — B20 Human immunodeficiency virus [HIV] disease: Secondary | ICD-10-CM

## 2010-10-03 DIAGNOSIS — F528 Other sexual dysfunction not due to a substance or known physiological condition: Secondary | ICD-10-CM

## 2010-10-03 DIAGNOSIS — R7309 Other abnormal glucose: Secondary | ICD-10-CM

## 2010-10-03 LAB — LIPID PANEL
HDL: 43 mg/dL (ref >39)
LDL Cholesterol: 51 mg/dL (ref 0–99)
Total CHOL/HDL Ratio: 3.5 Ratio

## 2010-10-03 LAB — RPR

## 2010-10-03 LAB — TESTOSTERONE: Testosterone: 776.98 ng/dL (ref 250–890)

## 2010-10-03 NOTE — Assessment & Plan Note (Signed)
He is doing well. Will stop his bactrim. He is offered condoms. Will rtc in 4-5 months.

## 2010-10-03 NOTE — Assessment & Plan Note (Signed)
Will check his testosterone today to see if he needs testosterone supplementation.

## 2010-10-03 NOTE — Progress Notes (Signed)
  Subjective:    Patient ID: John Dickerson, male    DOB: [birth year], 50 y.o.   MRN: [id number]  HPI 50 yo M with hx of HIV+ was Dx 1998. Was in hospital with ? Illness.  HTN. Last CD4 270, VL <20, Glc 174 (08-22-10).  No problems with his Atripla. No missed.    Review of Systems  Constitutional: Negative for unexpected weight change.  Gastrointestinal: Negative for diarrhea and constipation.  Genitourinary: Negative for difficulty urinating.  Neurological: Positive for light-headedness. Negative for headaches.  when his misses his BP meds, he gets lightheaded. States he has low testosterone and wants something for this. No paresthesias. n o acute vision change. Has trouble with reading and needs glasses.      Objective:   Physical Exam  Constitutional: He appears well-developed and well-nourished.  Eyes: EOM are normal. Pupils are equal, round, and reactive to light.  Neck: Neck supple.  Cardiovascular: Normal rate, regular rhythm and normal heart sounds.   Pulmonary/Chest: Effort normal and breath sounds normal.  Abdominal: Soft. Bowel sounds are normal.          Assessment & Plan:

## 2010-10-03 NOTE — Assessment & Plan Note (Signed)
Will have him seen by IM for this and his BP. prob needs to be changed to ACE-I

## 2010-10-03 NOTE — Assessment & Plan Note (Signed)
His Hep A Ab is + as is his Hep B S Ab. Will not vaccinate further. Will cont to follow his LFTs

## 2010-10-04 ENCOUNTER — Encounter: Payer: Self-pay | Admitting: Licensed Clinical Social Worker

## 2010-10-10 ENCOUNTER — Telehealth: Payer: Self-pay | Admitting: *Deleted

## 2010-10-10 NOTE — Telephone Encounter (Signed)
Patient is needing referral to IM for hypertension.  Outpatient clinic is not taking any new patient referral until June.  Patient notified and Doris at IM sent email for reminder to call patient.  Patient advised if he is having any problems to call this clinic for appointment.

## 2010-10-28 NOTE — Telephone Encounter (Signed)
Patient notified she said she may try to find another orthopedic, but she will see him again on 11/10/10 Marylen Ponto, CMA

## 2011-01-04 ENCOUNTER — Other Ambulatory Visit: Payer: Self-pay

## 2011-01-04 ENCOUNTER — Other Ambulatory Visit: Payer: Self-pay | Admitting: Infectious Diseases

## 2011-01-04 DIAGNOSIS — B20 Human immunodeficiency virus [HIV] disease: Secondary | ICD-10-CM

## 2011-01-05 LAB — COMPREHENSIVE METABOLIC PANEL
ALT: 48 U/L (ref 0–53)
AST: 48 U/L — ABNORMAL HIGH (ref 0–37)
Albumin: 4.6 g/dL (ref 3.5–5.2)
Calcium: 9.9 mg/dL (ref 8.4–10.5)
Chloride: 102 mEq/L (ref 96–112)
Potassium: 4.8 mEq/L (ref 3.5–5.3)

## 2011-01-05 LAB — CBC
MCHC: 34.4 g/dL (ref 30.0–36.0)
Platelets: 208 10*3/uL (ref 150–400)
RDW: 14.3 % (ref 11.5–15.5)

## 2011-01-05 LAB — T-HELPER CELL (CD4) - (RCID CLINIC ONLY)
CD4 % Helper T Cell: 22 % — ABNORMAL LOW (ref 33–55)
CD4 T Cell Abs: 400 uL (ref 400–2700)

## 2011-01-05 LAB — HIV-1 RNA QUANT-NO REFLEX-BLD
HIV 1 RNA Quant: <20 copies/mL (ref <20)
HIV-1 RNA Quant, Log: <1.3 {Log} (ref <1.30)

## 2011-01-18 ENCOUNTER — Encounter: Payer: Self-pay | Admitting: Infectious Diseases

## 2011-01-18 ENCOUNTER — Ambulatory Visit (INDEPENDENT_AMBULATORY_CARE_PROVIDER_SITE_OTHER): Payer: Self-pay | Admitting: Infectious Diseases

## 2011-01-18 DIAGNOSIS — Z113 Encounter for screening for infections with a predominantly sexual mode of transmission: Secondary | ICD-10-CM

## 2011-01-18 DIAGNOSIS — B20 Human immunodeficiency virus [HIV] disease: Secondary | ICD-10-CM

## 2011-01-18 DIAGNOSIS — Z79899 Other long term (current) drug therapy: Secondary | ICD-10-CM

## 2011-01-18 DIAGNOSIS — I10 Essential (primary) hypertension: Secondary | ICD-10-CM

## 2011-01-18 MED ORDER — ATENOLOL 25 MG PO TABS: 100.0000 mg | ORAL_TABLET | Freq: Every day | ORAL | Status: DC

## 2011-01-18 MED ORDER — ATENOLOL 100 MG PO TABS: 100.0000 mg | ORAL_TABLET | Freq: Every day | ORAL | Status: DC

## 2011-01-18 NOTE — Assessment & Plan Note (Signed)
He is doing very well. Given condoms today. vax uptodate. rtc 5-6 months with labs prior. Will watch his Trig.

## 2011-01-18 NOTE — Assessment & Plan Note (Signed)
Will increase his beta-blocker. Will have him back in 1 month for BP check.

## 2011-01-18 NOTE — Progress Notes (Signed)
  Subjective:    Patient ID: John Dickerson, male    DOB: 1960/11/24, 50 y.o.   MRN: [id number]  HPI 50 yo M with hx of HIV+ was Dx 1998 and HTN. Last CD4 400, VL <20, Glc 110 (01-04-11). Also had Trig 290 (01-02-11).   Currently taking atripla. Denies HA, CP, or SOB. Denies missed atenolol.     Review of Systems  Constitutional: Negative for unexpected weight change.  Respiratory: Negative for shortness of breath.   Gastrointestinal: Negative for diarrhea and constipation.  Genitourinary: Negative for dysuria.  Neurological: Negative for headaches.       Objective:   Physical Exam  Constitutional: He appears well-developed and well-nourished.  Eyes: EOM are normal. Pupils are equal, round, and reactive to light.  Cardiovascular: Normal rate, regular rhythm and normal heart sounds.   BP is repeated and same level gotten.         Assessment & Plan:

## 2011-01-18 NOTE — Progress Notes (Signed)
Addended by: Malone Admire C on: 01/18/2011 11:10 AM   Modules accepted: Orders

## 2011-01-19 ENCOUNTER — Telehealth: Payer: Self-pay | Admitting: *Deleted

## 2011-01-19 NOTE — Telephone Encounter (Signed)
Called and notified patient of appointment at [facility] for 01/24/11 at 11:15 am Wendall Mola CMA

## 2011-01-24 ENCOUNTER — Encounter: Payer: Self-pay | Admitting: Internal Medicine

## 2011-02-21 ENCOUNTER — Ambulatory Visit: Payer: Self-pay | Admitting: Licensed Clinical Social Worker

## 2011-02-21 VITALS — BP 173/93 | HR 61

## 2011-02-21 DIAGNOSIS — I10 Essential (primary) hypertension: Secondary | ICD-10-CM

## 2011-02-22 MED ORDER — LISINOPRIL 20 MG PO TABS: 20.0000 mg | ORAL_TABLET | Freq: Every day | ORAL | Status: DC

## 2011-02-22 NOTE — Patient Instructions (Addendum)
Please decrease atenolol to 50 mg and add Lisinopril 20 mg daily. Please follow up in 1 month for repeat blood pressure check.

## 2011-03-08 LAB — T-HELPER CELL (CD4) - (RCID CLINIC ONLY): CD4 T Cell Abs: 150 — ABNORMAL LOW

## 2011-03-13 ENCOUNTER — Telehealth: Payer: Self-pay | Admitting: *Deleted

## 2011-03-13 NOTE — Telephone Encounter (Signed)
Wants hiv numbers. Last ov referred to revisit in 5-6 months. Gave him the cd4 & vl

## 2011-03-15 LAB — T-HELPER CELL (CD4) - (RCID CLINIC ONLY)
CD4 % Helper T Cell: 12 — ABNORMAL LOW
CD4 T Cell Abs: 110 — ABNORMAL LOW

## 2011-04-10 ENCOUNTER — Ambulatory Visit (INDEPENDENT_AMBULATORY_CARE_PROVIDER_SITE_OTHER): Payer: Self-pay | Admitting: *Deleted

## 2011-04-10 DIAGNOSIS — B20 Human immunodeficiency virus [HIV] disease: Secondary | ICD-10-CM

## 2011-04-10 DIAGNOSIS — Z23 Encounter for immunization: Secondary | ICD-10-CM

## 2011-07-11 ENCOUNTER — Other Ambulatory Visit: Payer: Self-pay

## 2011-07-26 ENCOUNTER — Telehealth: Payer: Self-pay | Admitting: *Deleted

## 2011-07-26 ENCOUNTER — Ambulatory Visit: Payer: Self-pay | Admitting: Infectious Diseases

## 2011-07-26 NOTE — Telephone Encounter (Signed)
Called patient to have him reschedule his missed appointment and had to leave a message.

## 2011-08-16 ENCOUNTER — Other Ambulatory Visit: Payer: Self-pay | Admitting: *Deleted

## 2011-08-16 DIAGNOSIS — B20 Human immunodeficiency virus [HIV] disease: Secondary | ICD-10-CM

## 2011-08-16 MED ORDER — EFAVIRENZ-EMTRICITAB-TENOFOVIR 600-200-300 MG PO TABS: 1.0000 | ORAL_TABLET | Freq: Every day | ORAL | Status: DC

## 2011-08-22 ENCOUNTER — Other Ambulatory Visit (INDEPENDENT_AMBULATORY_CARE_PROVIDER_SITE_OTHER): Payer: Self-pay

## 2011-08-22 DIAGNOSIS — Z113 Encounter for screening for infections with a predominantly sexual mode of transmission: Secondary | ICD-10-CM

## 2011-08-22 DIAGNOSIS — Z79899 Other long term (current) drug therapy: Secondary | ICD-10-CM

## 2011-08-22 DIAGNOSIS — B20 Human immunodeficiency virus [HIV] disease: Secondary | ICD-10-CM

## 2011-08-22 LAB — CBC
HCT: 45.6 % (ref 39.0–52.0)
Hemoglobin: 16.2 g/dL (ref 13.0–17.0)
MCV: 89.1 fL (ref 78.0–100.0)
Platelets: 224 10*3/uL (ref 150–400)
RBC: 5.12 MIL/uL (ref 4.22–5.81)
WBC: 3.3 10*3/uL — ABNORMAL LOW (ref 4.0–10.5)

## 2011-08-22 LAB — COMPREHENSIVE METABOLIC PANEL
ALT: 60 U/L — ABNORMAL HIGH (ref 0–53)
AST: 56 U/L — ABNORMAL HIGH (ref 0–37)
Albumin: 4.3 g/dL (ref 3.5–5.2)
Calcium: 9.8 mg/dL (ref 8.4–10.5)
Chloride: 100 mEq/L (ref 96–112)
Potassium: 4.4 mEq/L (ref 3.5–5.3)
Total Protein: 7.2 g/dL (ref 6.0–8.3)

## 2011-08-23 LAB — T-HELPER CELL (CD4) - (RCID CLINIC ONLY): CD4 T Cell Abs: 370 uL — ABNORMAL LOW (ref 400–2700)

## 2011-08-24 LAB — HIV-1 RNA QUANT-NO REFLEX-BLD: HIV-1 RNA Quant, Log: <1.3 {Log} (ref <1.30)

## 2011-09-10 ENCOUNTER — Other Ambulatory Visit: Payer: Self-pay | Admitting: Infectious Diseases

## 2011-09-10 DIAGNOSIS — I10 Essential (primary) hypertension: Secondary | ICD-10-CM

## 2011-09-11 ENCOUNTER — Ambulatory Visit: Payer: Self-pay | Admitting: Infectious Diseases

## 2011-09-14 ENCOUNTER — Ambulatory Visit (INDEPENDENT_AMBULATORY_CARE_PROVIDER_SITE_OTHER): Payer: Self-pay | Admitting: Infectious Diseases

## 2011-09-14 ENCOUNTER — Encounter: Payer: Self-pay | Admitting: Infectious Diseases

## 2011-09-14 VITALS — BP 130/86 | HR 65 | Temp 98.2°F | Ht 63.0 in | Wt 172.0 lb

## 2011-09-14 DIAGNOSIS — F528 Other sexual dysfunction not due to a substance or known physiological condition: Secondary | ICD-10-CM

## 2011-09-14 DIAGNOSIS — B20 Human immunodeficiency virus [HIV] disease: Secondary | ICD-10-CM

## 2011-09-14 DIAGNOSIS — I10 Essential (primary) hypertension: Secondary | ICD-10-CM

## 2011-09-14 DIAGNOSIS — B1710 Acute hepatitis C without hepatic coma: Secondary | ICD-10-CM

## 2011-09-14 MED ORDER — SILDENAFIL CITRATE 50 MG PO TABS: 50.0000 mg | ORAL_TABLET | ORAL | Status: DC | PRN

## 2011-09-14 NOTE — Assessment & Plan Note (Signed)
He is doing very well. Will cont his current ART. Given condoms. vax are up to date. rtc in 5-6 months with labs prior.

## 2011-09-14 NOTE — Assessment & Plan Note (Signed)
Will renew his viagra. Given condoms.

## 2011-09-14 NOTE — Assessment & Plan Note (Addendum)
He has gotten Hep A vax x 1 and has Ab response. Will continue to follow LFTs (mildly elevated most recently).

## 2011-09-14 NOTE — Progress Notes (Signed)
  Subjective:    Patient ID: John Dickerson, male    DOB: Jun 21, 1960, 51 y.o.   MRN: [id number]  HPI 51 yo M with hx of HIV+ was Dx 1998, hyperlipidemia and HTN. At his previous visit his beta-blocker was increased and he was referred to IM- has not gone. BP is better today.  ART is doing well.  HIV 1 RNA Quant (copies/mL)  Date Value  08/22/2011 <20   01/04/2011 <20   04/11/2010 <20 copies/mL      CD4 T Cell Abs (cmm)  Date Value  08/22/2011 370*  01/04/2011 400   08/22/2010 270*        Review of Systems  Constitutional: Negative for appetite change and unexpected weight change.  Respiratory: Negative for chest tightness and shortness of breath.   Cardiovascular: Negative for chest pain.  Gastrointestinal: Negative for diarrhea and constipation.  Genitourinary: Negative for dysuria.  Neurological: Negative for headaches.       Objective:   Physical Exam  Constitutional: He appears well-developed and well-nourished.  HENT:  Mouth/Throat: No oropharyngeal exudate.  Eyes: EOM are normal. Pupils are equal, round, and reactive to light.  Neck: Neck supple.  Cardiovascular: Normal rate, regular rhythm and normal heart sounds.   Pulmonary/Chest: Effort normal and breath sounds normal.  Abdominal: Soft. Bowel sounds are normal.  Musculoskeletal: He exhibits no edema.  Lymphadenopathy:    He has no cervical adenopathy.          Assessment & Plan:

## 2011-09-14 NOTE — Assessment & Plan Note (Signed)
Under much better control now. He has a mild cough in clinic, hopefully not related to ACE-I.

## 2011-10-26 ENCOUNTER — Other Ambulatory Visit: Payer: Self-pay | Admitting: Infectious Diseases

## 2011-11-17 ENCOUNTER — Other Ambulatory Visit: Payer: Self-pay | Admitting: *Deleted

## 2011-11-17 DIAGNOSIS — B20 Human immunodeficiency virus [HIV] disease: Secondary | ICD-10-CM

## 2011-11-17 MED ORDER — EFAVIRENZ-EMTRICITAB-TENOFOVIR 600-200-300 MG PO TABS: 1.0000 | ORAL_TABLET | Freq: Every day | ORAL | Status: DC

## 2012-03-11 ENCOUNTER — Other Ambulatory Visit (INDEPENDENT_AMBULATORY_CARE_PROVIDER_SITE_OTHER): Payer: Self-pay

## 2012-03-11 DIAGNOSIS — B20 Human immunodeficiency virus [HIV] disease: Secondary | ICD-10-CM

## 2012-03-11 LAB — CBC WITH DIFFERENTIAL/PLATELET
Hemoglobin: 16.9 g/dL (ref 13.0–17.0)
Lymphs Abs: 1.3 10*3/uL (ref 0.7–4.0)
Monocytes Relative: 14 % — ABNORMAL HIGH (ref 3–12)
Neutro Abs: 1.6 10*3/uL — ABNORMAL LOW (ref 1.7–7.7)
Neutrophils Relative %: 44 % (ref 43–77)
RBC: 5.27 MIL/uL (ref 4.22–5.81)
WBC: 3.5 10*3/uL — ABNORMAL LOW (ref 4.0–10.5)

## 2012-03-11 LAB — COMPLETE METABOLIC PANEL WITH GFR
ALT: 48 U/L (ref 0–53)
Albumin: 4.3 g/dL (ref 3.5–5.2)
Alkaline Phosphatase: 78 U/L (ref 39–117)
CO2: 26 mEq/L (ref 19–32)
GFR, Est African American: >89 mL/min
GFR, Est Non African American: 85 mL/min
Glucose, Bld: 134 mg/dL — ABNORMAL HIGH (ref 70–99)
Potassium: 4.9 mEq/L (ref 3.5–5.3)
Sodium: 142 mEq/L (ref 135–145)
Total Protein: 7.3 g/dL (ref 6.0–8.3)

## 2012-03-12 LAB — T-HELPER CELL (CD4) - (RCID CLINIC ONLY): CD4 % Helper T Cell: 26 % — ABNORMAL LOW (ref 33–55)

## 2012-03-28 ENCOUNTER — Ambulatory Visit: Payer: Self-pay | Admitting: Infectious Diseases

## 2012-03-28 ENCOUNTER — Other Ambulatory Visit: Payer: Self-pay

## 2012-03-28 DIAGNOSIS — I10 Essential (primary) hypertension: Secondary | ICD-10-CM

## 2012-03-28 MED ORDER — LISINOPRIL 20 MG PO TABS: 20.0000 mg | ORAL_TABLET | Freq: Every day | ORAL | Status: DC

## 2012-04-24 ENCOUNTER — Ambulatory Visit: Payer: Self-pay | Admitting: Infectious Diseases

## 2012-05-06 ENCOUNTER — Encounter: Payer: Self-pay | Admitting: Infectious Diseases

## 2012-05-06 ENCOUNTER — Ambulatory Visit (INDEPENDENT_AMBULATORY_CARE_PROVIDER_SITE_OTHER): Payer: Self-pay | Admitting: Infectious Diseases

## 2012-05-06 VITALS — BP 192/97 | HR 70 | Temp 98.1°F | Ht 64.5 in | Wt 178.5 lb

## 2012-05-06 DIAGNOSIS — I10 Essential (primary) hypertension: Secondary | ICD-10-CM

## 2012-05-06 DIAGNOSIS — B20 Human immunodeficiency virus [HIV] disease: Secondary | ICD-10-CM

## 2012-05-06 DIAGNOSIS — Z23 Encounter for immunization: Secondary | ICD-10-CM

## 2012-05-06 MED ORDER — LISINOPRIL-HYDROCHLOROTHIAZIDE 20-12.5 MG PO TABS: 1.0000 | ORAL_TABLET | Freq: Every day | ORAL | Status: DC

## 2012-05-06 NOTE — Progress Notes (Signed)
  Subjective:    Patient ID: John Dickerson, male    DOB: 1961/05/20, 51 y.o.   MRN: [id number]  HPI 51 yo M with hx of HIV+ was Dx 1998, hyperlipidemia and HTN. No problems with meds. Has gotten flushot.   HIV 1 RNA Quant (copies/mL)  Date Value  03/11/2012 <20   08/22/2011 <20   01/04/2011 <20      CD4 T Cell Abs (cmm)  Date Value  03/11/2012 380*  08/22/2011 370*  01/04/2011 400        Review of Systems  Constitutional: Negative for appetite change and unexpected weight change.  Respiratory: Negative for shortness of breath.   Cardiovascular: Negative for chest pain.  Gastrointestinal: Negative for diarrhea and constipation.  Genitourinary: Negative for difficulty urinating.  Neurological: Negative for headaches.       Objective:   Physical Exam  Constitutional: He appears well-developed and well-nourished.  HENT:  Mouth/Throat: No oropharyngeal exudate.  Eyes: EOM are normal. Pupils are equal, round, and reactive to light.  Neck: Neck supple.  Cardiovascular: Normal rate, regular rhythm and normal heart sounds.   Pulmonary/Chest: Effort normal and breath sounds normal.  Abdominal: Soft. Bowel sounds are normal. There is no tenderness.  Lymphadenopathy:    He has no cervical adenopathy.          Assessment & Plan:

## 2012-05-06 NOTE — Assessment & Plan Note (Signed)
He is doing very well, no missed ART. Offered/refused condoms. Has gotten flu shot. Will see him back in 6 months.

## 2012-05-06 NOTE — Assessment & Plan Note (Addendum)
Will change ACE to add diuretic. Work on getting him into IM clinic. Had previous appt in August and no showed... Back in 3-4 weeks with BMP, HTN

## 2012-05-08 ENCOUNTER — Telehealth: Payer: Self-pay | Admitting: *Deleted

## 2012-05-08 NOTE — Telephone Encounter (Signed)
Called and left patient a voice mail to inform him of his appt with a PCP for hypertension at [facility] on 05/10/12 at 3:45 pm.  Asked him to call the clinic and let me know he received this appt info. Wendall Mola

## 2012-05-09 NOTE — Telephone Encounter (Signed)
Patient given appointment information John Dickerson  

## 2012-06-27 ENCOUNTER — Other Ambulatory Visit: Payer: Self-pay | Admitting: Infectious Diseases

## 2012-07-02 ENCOUNTER — Other Ambulatory Visit: Payer: Self-pay | Admitting: Licensed Clinical Social Worker

## 2012-07-02 DIAGNOSIS — B20 Human immunodeficiency virus [HIV] disease: Secondary | ICD-10-CM

## 2012-07-02 MED ORDER — EFAVIRENZ-EMTRICITAB-TENOFOVIR 600-200-300 MG PO TABS: 1.0000 | ORAL_TABLET | Freq: Every day | ORAL | Status: DC

## 2012-10-27 ENCOUNTER — Other Ambulatory Visit: Payer: Self-pay | Admitting: Infectious Diseases

## 2013-02-27 ENCOUNTER — Other Ambulatory Visit: Payer: Self-pay | Admitting: *Deleted

## 2013-02-27 DIAGNOSIS — B20 Human immunodeficiency virus [HIV] disease: Secondary | ICD-10-CM

## 2013-02-27 MED ORDER — EFAVIRENZ-EMTRICITAB-TENOFOVIR 600-200-300 MG PO TABS: 1.0000 | ORAL_TABLET | Freq: Every day | ORAL | Status: DC

## 2013-03-03 ENCOUNTER — Other Ambulatory Visit: Payer: Self-pay | Admitting: *Deleted

## 2013-03-03 DIAGNOSIS — I10 Essential (primary) hypertension: Secondary | ICD-10-CM

## 2013-03-03 DIAGNOSIS — B20 Human immunodeficiency virus [HIV] disease: Secondary | ICD-10-CM

## 2013-03-03 MED ORDER — EFAVIRENZ-EMTRICITAB-TENOFOVIR 600-200-300 MG PO TABS: 1.0000 | ORAL_TABLET | Freq: Every day | ORAL | Status: DC

## 2013-03-04 ENCOUNTER — Other Ambulatory Visit (INDEPENDENT_AMBULATORY_CARE_PROVIDER_SITE_OTHER): Payer: Self-pay

## 2013-03-04 ENCOUNTER — Other Ambulatory Visit: Payer: Self-pay | Admitting: *Deleted

## 2013-03-04 DIAGNOSIS — I10 Essential (primary) hypertension: Secondary | ICD-10-CM

## 2013-03-04 DIAGNOSIS — Z113 Encounter for screening for infections with a predominantly sexual mode of transmission: Secondary | ICD-10-CM

## 2013-03-04 DIAGNOSIS — Z79899 Other long term (current) drug therapy: Secondary | ICD-10-CM

## 2013-03-04 DIAGNOSIS — B20 Human immunodeficiency virus [HIV] disease: Secondary | ICD-10-CM

## 2013-03-04 LAB — BASIC METABOLIC PANEL
Potassium: 4.1 mEq/L (ref 3.5–5.3)
Sodium: 137 mEq/L (ref 135–145)

## 2013-03-04 LAB — CBC WITH DIFFERENTIAL/PLATELET
Basophils Relative: 1 % (ref 0–1)
Hemoglobin: 15.4 g/dL (ref 13.0–17.0)
Lymphs Abs: 1.4 10*3/uL (ref 0.7–4.0)
Monocytes Relative: 11 % (ref 3–12)
Neutro Abs: 1.7 10*3/uL (ref 1.7–7.7)
Neutrophils Relative %: 45 % (ref 43–77)
RBC: 4.62 MIL/uL (ref 4.22–5.81)
WBC: 3.6 10*3/uL — ABNORMAL LOW (ref 4.0–10.5)

## 2013-03-04 LAB — LIPID PANEL
Cholesterol: 147 mg/dL (ref 0–200)
HDL: 38 mg/dL — ABNORMAL LOW (ref >39)
Total CHOL/HDL Ratio: 3.9 Ratio

## 2013-03-04 LAB — RPR

## 2013-03-04 MED ORDER — EFAVIRENZ-EMTRICITAB-TENOFOVIR 600-200-300 MG PO TABS: 1.0000 | ORAL_TABLET | Freq: Every day | ORAL | Status: DC

## 2013-03-04 NOTE — Telephone Encounter (Signed)
Patient in office today and advised he gets his medication from [facility] so changed the pharmacy in his chart.

## 2013-03-05 LAB — T-HELPER CELL (CD4) - (RCID CLINIC ONLY): CD4 % Helper T Cell: 28 % — ABNORMAL LOW (ref 33–55)

## 2013-03-05 LAB — HIV-1 RNA QUANT-NO REFLEX-BLD
HIV 1 RNA Quant: <20 {copies}/mL (ref <20)
HIV-1 RNA Quant, Log: <1.3 {Log} (ref <1.30)

## 2013-03-24 ENCOUNTER — Encounter: Payer: Self-pay | Admitting: Infectious Diseases

## 2013-03-24 ENCOUNTER — Ambulatory Visit (INDEPENDENT_AMBULATORY_CARE_PROVIDER_SITE_OTHER): Payer: Self-pay | Admitting: Infectious Diseases

## 2013-03-24 VITALS — BP 135/85 | HR 68 | Temp 98.4°F | Ht 66.0 in | Wt 170.0 lb

## 2013-03-24 DIAGNOSIS — M19019 Primary osteoarthritis, unspecified shoulder: Secondary | ICD-10-CM

## 2013-03-24 DIAGNOSIS — Z23 Encounter for immunization: Secondary | ICD-10-CM

## 2013-03-24 DIAGNOSIS — I10 Essential (primary) hypertension: Secondary | ICD-10-CM

## 2013-03-24 DIAGNOSIS — B20 Human immunodeficiency virus [HIV] disease: Secondary | ICD-10-CM

## 2013-03-24 DIAGNOSIS — Z113 Encounter for screening for infections with a predominantly sexual mode of transmission: Secondary | ICD-10-CM

## 2013-03-24 DIAGNOSIS — E781 Pure hyperglyceridemia: Secondary | ICD-10-CM

## 2013-03-24 DIAGNOSIS — Z79899 Other long term (current) drug therapy: Secondary | ICD-10-CM

## 2013-03-24 DIAGNOSIS — M19012 Primary osteoarthritis, left shoulder: Secondary | ICD-10-CM | POA: Insufficient documentation

## 2013-03-24 DIAGNOSIS — B1710 Acute hepatitis C without hepatic coma: Secondary | ICD-10-CM

## 2013-03-24 NOTE — Assessment & Plan Note (Signed)
He is doing well. He did not make his previous f/u with IM (transportation). Will continue his same meds, watch. encouraged him to exercise.
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
## 2013-03-24 NOTE — Assessment & Plan Note (Signed)
Will continue to follow his LFTs.  

## 2013-03-24 NOTE — Assessment & Plan Note (Signed)
Gets flu shot today. Offered/refuses condoms. He is doing well. Will cont his same ART. rtc 6 months.

## 2013-03-24 NOTE — Assessment & Plan Note (Signed)
Continue otc prn.

## 2013-03-24 NOTE — Assessment & Plan Note (Signed)
Continue to encourage diet and exercise. Consider lopid

## 2013-03-25 DIAGNOSIS — Z23 Encounter for immunization: Secondary | ICD-10-CM

## 2013-05-09 ENCOUNTER — Other Ambulatory Visit: Payer: Self-pay | Admitting: Infectious Diseases

## 2013-05-09 DIAGNOSIS — B20 Human immunodeficiency virus [HIV] disease: Secondary | ICD-10-CM

## 2013-08-06 ENCOUNTER — Telehealth: Payer: Self-pay | Admitting: *Deleted

## 2013-08-06 NOTE — Telephone Encounter (Signed)
The patient has not been seen by a provider at [facility] from 03/25/13 - present.  No records available.  Left message for Ms. Yount to this effect.

## 2013-09-08 ENCOUNTER — Encounter: Payer: Self-pay | Admitting: *Deleted

## 2013-09-22 ENCOUNTER — Other Ambulatory Visit (INDEPENDENT_AMBULATORY_CARE_PROVIDER_SITE_OTHER): Payer: Self-pay

## 2013-09-22 DIAGNOSIS — B1710 Acute hepatitis C without hepatic coma: Secondary | ICD-10-CM

## 2013-09-22 DIAGNOSIS — Z79899 Other long term (current) drug therapy: Secondary | ICD-10-CM

## 2013-09-22 DIAGNOSIS — B20 Human immunodeficiency virus [HIV] disease: Secondary | ICD-10-CM

## 2013-09-22 LAB — LIPID PANEL
CHOLESTEROL: 187 mg/dL (ref 0–200)
HDL: 54 mg/dL (ref >39)
LDL Cholesterol: 99 mg/dL (ref 0–99)
Total CHOL/HDL Ratio: 3.5 Ratio
Triglycerides: 170 mg/dL — ABNORMAL HIGH (ref <150)
VLDL: 34 mg/dL (ref 0–40)

## 2013-09-22 LAB — CBC WITH DIFFERENTIAL/PLATELET
Basophils Absolute: 0 10*3/uL (ref 0.0–0.1)
Basophils Relative: 1 % (ref 0–1)
Eosinophils Absolute: 0.2 10*3/uL (ref 0.0–0.7)
Eosinophils Relative: 4 % (ref 0–5)
HEMATOCRIT: 42.5 % (ref 39.0–52.0)
Hemoglobin: 15.2 g/dL (ref 13.0–17.0)
LYMPHS ABS: 1.3 10*3/uL (ref 0.7–4.0)
Lymphocytes Relative: 33 % (ref 12–46)
MCH: 31.9 pg (ref 26.0–34.0)
MCHC: 35.8 g/dL (ref 30.0–36.0)
MCV: 89.3 fL (ref 78.0–100.0)
MONO ABS: 0.5 10*3/uL (ref 0.1–1.0)
Monocytes Relative: 14 % — ABNORMAL HIGH (ref 3–12)
NEUTROS ABS: 1.9 10*3/uL (ref 1.7–7.7)
Neutrophils Relative %: 48 % (ref 43–77)
PLATELETS: 217 10*3/uL (ref 150–400)
RBC: 4.76 MIL/uL (ref 4.22–5.81)
RDW: 13.2 % (ref 11.5–15.5)
WBC: 3.9 10*3/uL — AB (ref 4.0–10.5)

## 2013-09-22 LAB — COMPLETE METABOLIC PANEL WITH GFR
ALBUMIN: 4.2 g/dL (ref 3.5–5.2)
ALT: 35 U/L (ref 0–53)
AST: 36 U/L (ref 0–37)
Alkaline Phosphatase: 75 U/L (ref 39–117)
BUN: 17 mg/dL (ref 6–23)
CHLORIDE: 104 meq/L (ref 96–112)
CO2: 27 mEq/L (ref 19–32)
Calcium: 9.3 mg/dL (ref 8.4–10.5)
Creat: 1.02 mg/dL (ref 0.50–1.35)
GFR, Est African American: >89 mL/min
GFR, Est Non African American: 82 mL/min
Glucose, Bld: 142 mg/dL — ABNORMAL HIGH (ref 70–99)
POTASSIUM: 4.5 meq/L (ref 3.5–5.3)
Sodium: 141 mEq/L (ref 135–145)
Total Bilirubin: 0.4 mg/dL (ref 0.2–1.2)
Total Protein: 7 g/dL (ref 6.0–8.3)

## 2013-09-23 LAB — HIV-1 RNA QUANT-NO REFLEX-BLD
HIV 1 RNA Quant: <20 copies/mL (ref <20)
HIV-1 RNA Quant, Log: <1.3 {Log} (ref <1.30)

## 2013-09-23 LAB — T-HELPER CELL (CD4) - (RCID CLINIC ONLY)
CD4 % Helper T Cell: 28 % — ABNORMAL LOW (ref 33–55)
CD4 T Cell Abs: 390 /uL — ABNORMAL LOW (ref 400–2700)

## 2013-10-06 ENCOUNTER — Ambulatory Visit (INDEPENDENT_AMBULATORY_CARE_PROVIDER_SITE_OTHER): Payer: Self-pay | Admitting: Infectious Diseases

## 2013-10-06 ENCOUNTER — Encounter: Payer: Self-pay | Admitting: Infectious Diseases

## 2013-10-06 VITALS — BP 126/84 | HR 71 | Temp 98.6°F | Ht 63.0 in | Wt 171.0 lb

## 2013-10-06 DIAGNOSIS — B20 Human immunodeficiency virus [HIV] disease: Secondary | ICD-10-CM

## 2013-10-06 DIAGNOSIS — B1710 Acute hepatitis C without hepatic coma: Secondary | ICD-10-CM

## 2013-10-06 DIAGNOSIS — E781 Pure hyperglyceridemia: Secondary | ICD-10-CM

## 2013-10-06 DIAGNOSIS — I10 Essential (primary) hypertension: Secondary | ICD-10-CM

## 2013-10-06 DIAGNOSIS — Z23 Encounter for immunization: Secondary | ICD-10-CM
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
## 2013-10-06 NOTE — Assessment & Plan Note (Signed)
He is Hep A immune, will check VL and genotype with next visit.

## 2013-10-06 NOTE — Assessment & Plan Note (Signed)
He is doing very well. vax are uptodate. He is given condoms today. Will see him back in 6 months.

## 2013-10-06 NOTE — Addendum Note (Signed)
Addended by: Myrtis Hopping A on: 10/06/2013 11:40 AM   Modules accepted: Orders

## 2013-10-06 NOTE — Assessment & Plan Note (Signed)
Today's BP is good, will continue to watch.

## 2013-10-06 NOTE — Assessment & Plan Note (Signed)
Most recent level is near normal, will continue to watch.

## 2013-12-19 ENCOUNTER — Other Ambulatory Visit: Payer: Self-pay | Admitting: Infectious Diseases

## 2013-12-31 ENCOUNTER — Other Ambulatory Visit: Payer: Self-pay | Admitting: Infectious Diseases

## 2014-01-16 ENCOUNTER — Other Ambulatory Visit: Payer: Self-pay | Admitting: Infectious Diseases

## 2014-04-07 ENCOUNTER — Ambulatory Visit (INDEPENDENT_AMBULATORY_CARE_PROVIDER_SITE_OTHER): Payer: Self-pay | Admitting: Licensed Clinical Social Worker

## 2014-04-07 ENCOUNTER — Other Ambulatory Visit (INDEPENDENT_AMBULATORY_CARE_PROVIDER_SITE_OTHER): Payer: Self-pay

## 2014-04-07 DIAGNOSIS — B1710 Acute hepatitis C without hepatic coma: Secondary | ICD-10-CM

## 2014-04-07 DIAGNOSIS — Z113 Encounter for screening for infections with a predominantly sexual mode of transmission: Secondary | ICD-10-CM

## 2014-04-07 DIAGNOSIS — Z23 Encounter for immunization: Secondary | ICD-10-CM

## 2014-04-07 LAB — RPR

## 2014-04-08 LAB — HEPATITIS C RNA QUANTITATIVE
HCV Quantitative Log: 7.17 {Log} — ABNORMAL HIGH (ref <1.18)
HCV Quantitative: 14805457 IU/mL — ABNORMAL HIGH (ref <15)

## 2014-04-08 LAB — HIV-1 RNA QUANT-NO REFLEX-BLD
HIV 1 RNA Quant: 42 copies/mL — ABNORMAL HIGH (ref <20)
HIV-1 RNA Quant, Log: 1.62 {Log} — ABNORMAL HIGH (ref <1.30)

## 2014-04-08 LAB — T-HELPER CELL (CD4) - (RCID CLINIC ONLY)
CD4 % Helper T Cell: 26 % — ABNORMAL LOW (ref 33–55)
CD4 T Cell Abs: 570 /uL (ref 400–2700)

## 2014-04-10 LAB — HEPATITIS C GENOTYPE

## 2014-04-22 ENCOUNTER — Encounter: Payer: Self-pay | Admitting: Infectious Diseases

## 2014-04-22 ENCOUNTER — Ambulatory Visit: Payer: Self-pay | Admitting: Infectious Diseases

## 2014-04-22 ENCOUNTER — Ambulatory Visit (INDEPENDENT_AMBULATORY_CARE_PROVIDER_SITE_OTHER): Payer: Self-pay | Admitting: Infectious Diseases

## 2014-04-22 VITALS — BP 145/86 | HR 67 | Temp 98.2°F | Wt 168.0 lb

## 2014-04-22 DIAGNOSIS — I10 Essential (primary) hypertension: Secondary | ICD-10-CM

## 2014-04-22 DIAGNOSIS — Z79899 Other long term (current) drug therapy: Secondary | ICD-10-CM

## 2014-04-22 DIAGNOSIS — B20 Human immunodeficiency virus [HIV] disease: Secondary | ICD-10-CM

## 2014-04-22 DIAGNOSIS — Z113 Encounter for screening for infections with a predominantly sexual mode of transmission: Secondary | ICD-10-CM

## 2014-04-22 DIAGNOSIS — B1710 Acute hepatitis C without hepatic coma: Secondary | ICD-10-CM

## 2014-04-22 DIAGNOSIS — E781 Pure hyperglyceridemia: Secondary | ICD-10-CM

## 2014-04-22 NOTE — Progress Notes (Signed)
   Subjective:    Patient ID: Emmanuel Gruenhagen, male    DOB: Nov 15, 1960, 53 y.o.   MRN: [id number]  HPI 53 yo M with hx of HIV+ was Dx 1998, and HTN. Hep C 1a, 14.8 million VL October 2015.  He is on atripla. No problems.  Previously Hep A/B (S Ab)+.  Today complains of tooth ache. Kept him awake all night. Started ~ 10-31.   HIV 1 RNA QUANT (copies/mL)  Date Value  04/07/2014 42*  09/22/2013 <20  03/04/2013 <20   CD4 T CELL ABS (/uL)  Date Value  04/07/2014 570  09/22/2013 390*  03/04/2013 400   Lab Results  Component Value Date   CHOL 187 09/22/2013   HDL 54 09/22/2013   LDLCALC 99 09/22/2013   TRIG 170* 09/22/2013   CHOLHDL 3.5 09/22/2013    No problems with anti-htn rx.   Review of Systems  Constitutional: Negative for appetite change and unexpected weight change.  Gastrointestinal: Negative for diarrhea and constipation.  Genitourinary: Negative for difficulty urinating.       Objective:   Physical Exam  Constitutional: He appears well-developed and well-nourished.  HENT:  Mouth/Throat: No oropharyngeal exudate.  Eyes: EOM are normal. Pupils are equal, round, and reactive to light.  Neck: Neck supple.  Cardiovascular: Normal rate, regular rhythm and normal heart sounds.   Pulmonary/Chest: Effort normal and breath sounds normal.  Abdominal: Soft. Bowel sounds are normal. He exhibits no distension. There is no tenderness.  Lymphadenopathy:    He has no cervical adenopathy.          Assessment & Plan:

## 2014-04-22 NOTE — Assessment & Plan Note (Signed)
Will get him orange card for elastography.  Will check ANA, iron, PT/INR.

## 2014-04-22 NOTE — Assessment & Plan Note (Signed)
Appears to be doing well. Will continue to watch.

## 2014-04-22 NOTE — Assessment & Plan Note (Signed)
Has blip. He is doing well. Offered/refuses condoms, gets flu shot. Other vax uptodate. Will see him back in 4-5 months to complete Hep C staging, possible rx.

## 2014-04-22 NOTE — Assessment & Plan Note (Signed)
Suspect increased today due to dental pain. No change in medications.

## 2014-04-23 ENCOUNTER — Telehealth: Payer: Self-pay | Admitting: *Deleted

## 2014-04-23 NOTE — Telephone Encounter (Signed)
Patient will call me once he receives the orange discount card from [facility]. I will then be able to schedule the ultrasound elastography and he can come in for Hep C labs. Myrtis Hopping

## 2014-05-12 ENCOUNTER — Other Ambulatory Visit: Payer: Medicaid Other

## 2014-05-12 DIAGNOSIS — B1710 Acute hepatitis C without hepatic coma: Secondary | ICD-10-CM

## 2014-05-12 LAB — IRON: Iron: 216 ug/dL — ABNORMAL HIGH (ref 42–165)

## 2014-05-13 LAB — PROTIME-INR
INR: 0.94 (ref <1.50)
Prothrombin Time: 12.6 seconds (ref 11.6–15.2)

## 2014-05-13 LAB — ANA: ANA: NEGATIVE

## 2014-05-26 ENCOUNTER — Ambulatory Visit (HOSPITAL_COMMUNITY)
Admission: RE | Admit: 2014-05-26 | Discharge: 2014-05-26 | Disposition: A | Discharge status: Home / Self Care | Payer: Self-pay | Source: Ambulatory Visit | Attending: Infectious Diseases | Admitting: Infectious Diseases

## 2014-05-26 DIAGNOSIS — B1710 Acute hepatitis C without hepatic coma: Secondary | ICD-10-CM

## 2014-05-26 DIAGNOSIS — B182 Chronic viral hepatitis C: Secondary | ICD-10-CM | POA: Insufficient documentation

## 2014-06-24 ENCOUNTER — Other Ambulatory Visit: Payer: Self-pay | Admitting: Infectious Diseases

## 2014-06-24 DIAGNOSIS — B20 Human immunodeficiency virus [HIV] disease: Secondary | ICD-10-CM

## 2014-07-17 ENCOUNTER — Other Ambulatory Visit: Payer: Self-pay | Admitting: Licensed Clinical Social Worker

## 2014-07-17 MED ORDER — LISINOPRIL 20 MG PO TABS: 20.0000 mg | ORAL_TABLET | Freq: Every day | ORAL | Status: DC

## 2014-08-10 ENCOUNTER — Other Ambulatory Visit (INDEPENDENT_AMBULATORY_CARE_PROVIDER_SITE_OTHER): Payer: Self-pay

## 2014-08-10 ENCOUNTER — Other Ambulatory Visit (HOSPITAL_COMMUNITY)
Admission: RE | Admit: 2014-08-10 | Discharge: 2014-08-10 | Disposition: A | Discharge status: Home / Self Care | Payer: Medicaid Other | Source: Ambulatory Visit | Attending: Infectious Diseases | Admitting: Infectious Diseases

## 2014-08-10 ENCOUNTER — Other Ambulatory Visit: Payer: Self-pay | Admitting: *Deleted

## 2014-08-10 DIAGNOSIS — Z113 Encounter for screening for infections with a predominantly sexual mode of transmission: Secondary | ICD-10-CM

## 2014-08-10 DIAGNOSIS — Z79899 Other long term (current) drug therapy: Secondary | ICD-10-CM

## 2014-08-10 DIAGNOSIS — B20 Human immunodeficiency virus [HIV] disease: Secondary | ICD-10-CM

## 2014-08-10 LAB — CBC
HCT: 43.2 % (ref 39.0–52.0)
Hemoglobin: 14.9 g/dL (ref 13.0–17.0)
MCH: 31.5 pg (ref 26.0–34.0)
MCHC: 34.5 g/dL (ref 30.0–36.0)
MCV: 91.3 fL (ref 78.0–100.0)
MPV: 9.5 fL (ref 8.6–12.4)
Platelets: 279 10*3/uL (ref 150–400)
RBC: 4.73 MIL/uL (ref 4.22–5.81)
RDW: 13.3 % (ref 11.5–15.5)
WBC: 4.5 10*3/uL (ref 4.0–10.5)

## 2014-08-10 LAB — LIPID PANEL
CHOLESTEROL: 134 mg/dL (ref 0–200)
HDL: 42 mg/dL (ref >=40)
LDL Cholesterol: 63 mg/dL (ref 0–99)
Total CHOL/HDL Ratio: 3.2 Ratio
Triglycerides: 146 mg/dL (ref <150)
VLDL: 29 mg/dL (ref 0–40)

## 2014-08-10 LAB — COMPLETE METABOLIC PANEL WITH GFR
ALBUMIN: 3.8 g/dL (ref 3.5–5.2)
ALT: 33 U/L (ref 0–53)
AST: 38 U/L — AB (ref 0–37)
Alkaline Phosphatase: 118 U/L — ABNORMAL HIGH (ref 39–117)
BUN: 17 mg/dL (ref 6–23)
CHLORIDE: 105 meq/L (ref 96–112)
CO2: 28 meq/L (ref 19–32)
CREATININE: 0.95 mg/dL (ref 0.50–1.35)
Calcium: 9.6 mg/dL (ref 8.4–10.5)
GFR, Est African American: >89 mL/min
GLUCOSE: 135 mg/dL — AB (ref 70–99)
POTASSIUM: 4.6 meq/L (ref 3.5–5.3)
SODIUM: 140 meq/L (ref 135–145)
Total Bilirubin: 0.3 mg/dL (ref 0.2–1.2)
Total Protein: 7 g/dL (ref 6.0–8.3)

## 2014-08-10 MED ORDER — LISINOPRIL 20 MG PO TABS: 20.0000 mg | ORAL_TABLET | Freq: Every day | ORAL | Status: DC

## 2014-08-10 MED ORDER — ATENOLOL 100 MG PO TABS: 100.0000 mg | ORAL_TABLET | Freq: Every day | ORAL | Status: DC

## 2014-08-11 LAB — T-HELPER CELL (CD4) - (RCID CLINIC ONLY)
CD4 T CELL HELPER: 25 % — AB (ref 33–55)
CD4 T Cell Abs: 430 /uL (ref 400–2700)

## 2014-08-11 LAB — URINE CYTOLOGY ANCILLARY ONLY
Chlamydia: NEGATIVE
Neisseria Gonorrhea: NEGATIVE

## 2014-08-11 LAB — RPR

## 2014-08-11 LAB — HIV-1 RNA QUANT-NO REFLEX-BLD

## 2014-08-24 ENCOUNTER — Ambulatory Visit (INDEPENDENT_AMBULATORY_CARE_PROVIDER_SITE_OTHER): Payer: Self-pay | Admitting: Infectious Diseases

## 2014-08-24 ENCOUNTER — Encounter: Payer: Self-pay | Admitting: Infectious Diseases

## 2014-08-24 VITALS — BP 147/88 | HR 63 | Temp 98.2°F | Ht 64.0 in | Wt 178.0 lb

## 2014-08-24 DIAGNOSIS — B20 Human immunodeficiency virus [HIV] disease: Secondary | ICD-10-CM

## 2014-08-24 DIAGNOSIS — B1710 Acute hepatitis C without hepatic coma: Secondary | ICD-10-CM

## 2014-08-24 MED ORDER — ELVITEG-COBIC-EMTRICIT-TENOFAF 150-150-200-10 MG PO TABS: 1.0000 | ORAL_TABLET | Freq: Every day | ORAL | Status: DC

## 2014-08-24 NOTE — Progress Notes (Signed)
Patient ID: John Dickerson, male   DOB: 1961/06/14, 54 y.o.   MRN: [id number] HPI: John Dickerson is a 54 y.o. male who is here for f/u of his HIV and hep C  Lab Results  Component Value Date   HCVGENOTYPE 1a 04/07/2014    Allergies: No Known Allergies  Vitals: Temp: 98.2 F (36.8 C) (03/07 0849) BP: 147/88 mmHg (03/07 0849) Pulse Rate: 63 (03/07 0849)  Past Medical History: No past medical history on file.  Social History: History   Social History  . Marital Status: Single    Spouse Name: N/A  . Number of Children: N/A  . Years of Education: N/A   Social History Main Topics  . Smoking status: Former Smoker -- 0.30 packs/day for 30 years    Start date: 07/20/2012  . Smokeless tobacco: Never Used  . Alcohol Use: 0.0 oz/week    0 Standard drinks or equivalent per week     Comment: occasional  . Drug Use: No  . Sexual Activity:    Partners: Female     Comment: pt. given condoms   Other Topics Concern  . None   Social History Narrative    Labs: HIV 1 RNA QUANT (copies/mL)  Date Value  08/10/2014 <20  04/07/2014 42*  09/22/2013 <20   CD4 T CELL ABS (/uL)  Date Value  08/10/2014 430  04/07/2014 570  09/22/2013 390*   HEP B S AB (no units)  Date Value  08/13/2006 YES   HEPATITIS B SURFACE AG (no units)  Date Value  08/13/2006 No   HCV AB (no units)  Date Value  08/13/2006 YES    Lab Results  Component Value Date   HCVGENOTYPE 1a 04/07/2014    Hepatitis C RNA quantitative Latest Ref Rng 04/07/2014  HCV Quantitative <15 IU/mL 16073710(G)  HCV Quantitative Log <1.18 log 10 7.17(H)    AST (U/L)  Date Value  08/10/2014 38*  09/22/2013 36  03/11/2012 37   ALT (U/L)  Date Value  08/10/2014 33  09/22/2013 35  03/11/2012 48   INR (no units)  Date Value  05/12/2014 0.94    CrCl: Estimated Creatinine Clearance: 84.3 mL/min (by C-G formula based on Cr of 0.95).  Fibrosis Score: F2/3 as assessed by ARFI  Child-Pugh  Score: Class A  Previous Treatment Regimen: Naive  Assessment: 54 yo who is here for his f/u of HIV. He is also co-infected with hep C. Elastography has been done and he is F2/3. He has ADAP so patient assistance is the only option here. He is currently on Atripla. We are going to change it to [facility]. Since he has ADAP, this will not be an issue. He seems very motivated with the hep C therapy. Application is faxed today. Explained the process to him and all of the potential interactions. He understood.   Recommendations: Change Atripla to Genvoya 1 PO qday PAP for [employer], [place], [place].D., BCPS, AAHIVP Clinical [facility]ious [place] [facility] 08/24/2014, 10:46 AM

## 2014-08-24 NOTE — Addendum Note (Signed)
Addended by: Dinah Beers on: 08/24/2014 10:58 AM   Modules accepted: Orders, Medications

## 2014-08-24 NOTE — Assessment & Plan Note (Signed)
Will have him meet with pharm for tx.

## 2014-08-24 NOTE — Progress Notes (Signed)
   Subjective:    Patient ID: John Dickerson, male    DOB: [birth year], 54 y.o.   MRN: [id number]  HPI  54 yo M with hx of HIV+ was Dx 1998, and HTN. Hep C 1a, 14.8 million VL October 2015.  He is on atripla.  Previously Hep A/B (S Ab)+.  Has been feeling well.  Had elastogram 05-26-14: F2/3.  Has had 2 deaths in the family recently. Sister had , nephew died from being "tazed".   HIV 1 RNA QUANT (copies/mL)  Date Value  08/10/2014 <20  04/07/2014 42*  09/22/2013 <20   CD4 T CELL ABS (/uL)  Date Value  08/10/2014 430  04/07/2014 570  09/22/2013 390*   Review of Systems  Constitutional: Negative for appetite change and unexpected weight change.  Gastrointestinal: Negative for diarrhea and constipation.  Genitourinary: Negative for difficulty urinating.      Objective:   Physical Exam  Constitutional: He appears well-developed and well-nourished.  HENT:  Mouth/Throat: No oropharyngeal exudate.  Eyes: EOM are normal. Pupils are equal, round, and reactive to light.  Neck: Neck supple.  Cardiovascular: Normal rate, regular rhythm and normal heart sounds.   Pulmonary/Chest: Effort normal and breath sounds normal.  Abdominal: Soft. Bowel sounds are normal. He exhibits no distension. There is no tenderness.  Lymphadenopathy:    He has no cervical adenopathy.          Assessment & Plan:

## 2014-08-24 NOTE — Assessment & Plan Note (Signed)
Will change his ART when we have clearer path of his possible Hep C rx.  He is Offered/refused condoms.  He will meet with phram today, f/u appt based on his hep C eval.  vax up to date.

## 2014-11-30 ENCOUNTER — Ambulatory Visit: Payer: Medicaid Other | Admitting: Infectious Diseases

## 2015-03-08 ENCOUNTER — Other Ambulatory Visit: Payer: Medicaid Other

## 2015-03-11 ENCOUNTER — Other Ambulatory Visit (INDEPENDENT_AMBULATORY_CARE_PROVIDER_SITE_OTHER): Payer: Self-pay

## 2015-03-11 DIAGNOSIS — B20 Human immunodeficiency virus [HIV] disease: Secondary | ICD-10-CM

## 2015-03-11 LAB — CBC
HEMATOCRIT: 46 % (ref 39.0–52.0)
Hemoglobin: 16.2 g/dL (ref 13.0–17.0)
MCH: 31.1 pg (ref 26.0–34.0)
MCHC: 35.2 g/dL (ref 30.0–36.0)
MCV: 88.3 fL (ref 78.0–100.0)
MPV: 10.4 fL (ref 8.6–12.4)
PLATELETS: 177 10*3/uL (ref 150–400)
RBC: 5.21 MIL/uL (ref 4.22–5.81)
RDW: 13 % (ref 11.5–15.5)
WBC: 4 10*3/uL (ref 4.0–10.5)

## 2015-03-11 LAB — COMPREHENSIVE METABOLIC PANEL
ALT: 43 U/L (ref 9–46)
AST: 40 U/L — ABNORMAL HIGH (ref 10–35)
Albumin: 4 g/dL (ref 3.6–5.1)
Alkaline Phosphatase: 75 U/L (ref 40–115)
BUN: 15 mg/dL (ref 7–25)
CHLORIDE: 100 mmol/L (ref 98–110)
CO2: 27 mmol/L (ref 20–31)
Calcium: 9.3 mg/dL (ref 8.6–10.3)
Creat: 0.9 mg/dL (ref 0.70–1.33)
Glucose, Bld: 300 mg/dL — ABNORMAL HIGH (ref 65–99)
POTASSIUM: 4.5 mmol/L (ref 3.5–5.3)
Sodium: 137 mmol/L (ref 135–146)
Total Bilirubin: 0.6 mg/dL (ref 0.2–1.2)
Total Protein: 7 g/dL (ref 6.1–8.1)

## 2015-03-12 LAB — HIV-1 RNA QUANT-NO REFLEX-BLD
HIV 1 RNA Quant: <20 copies/mL (ref <20)
HIV-1 RNA Quant, Log: <1.3 {Log} (ref <1.30)

## 2015-03-12 LAB — T-HELPER CELL (CD4) - (RCID CLINIC ONLY)
CD4 T CELL ABS: 540 /uL (ref 400–2700)
CD4 T CELL HELPER: 31 % — AB (ref 33–55)

## 2015-03-17 NOTE — Progress Notes (Signed)
Approval faxed to [facility]. Landis Gandy, RN

## 2015-03-29 ENCOUNTER — Other Ambulatory Visit: Payer: Self-pay | Admitting: Infectious Diseases

## 2015-03-29 ENCOUNTER — Ambulatory Visit (INDEPENDENT_AMBULATORY_CARE_PROVIDER_SITE_OTHER): Payer: Self-pay | Admitting: Infectious Diseases

## 2015-03-29 ENCOUNTER — Encounter: Payer: Self-pay | Admitting: Infectious Diseases

## 2015-03-29 VITALS — BP 172/92 | HR 63 | Temp 98.1°F | Wt 176.0 lb

## 2015-03-29 DIAGNOSIS — I10 Essential (primary) hypertension: Secondary | ICD-10-CM

## 2015-03-29 DIAGNOSIS — Z23 Encounter for immunization: Secondary | ICD-10-CM

## 2015-03-29 DIAGNOSIS — Z79899 Other long term (current) drug therapy: Secondary | ICD-10-CM

## 2015-03-29 DIAGNOSIS — B1710 Acute hepatitis C without hepatic coma: Secondary | ICD-10-CM

## 2015-03-29 DIAGNOSIS — Z113 Encounter for screening for infections with a predominantly sexual mode of transmission: Secondary | ICD-10-CM

## 2015-03-29 DIAGNOSIS — B20 Human immunodeficiency virus [HIV] disease: Secondary | ICD-10-CM

## 2015-03-29 LAB — CBC
HEMATOCRIT: 45.5 % (ref 39.0–52.0)
HEMOGLOBIN: 16.5 g/dL (ref 13.0–17.0)
MCH: 31.4 pg (ref 26.0–34.0)
MCHC: 36.3 g/dL — ABNORMAL HIGH (ref 30.0–36.0)
MCV: 86.5 fL (ref 78.0–100.0)
MPV: 10.1 fL (ref 8.6–12.4)
Platelets: 207 10*3/uL (ref 150–400)
RBC: 5.26 MIL/uL (ref 4.22–5.81)
RDW: 13.1 % (ref 11.5–15.5)
WBC: 4.4 10*3/uL (ref 4.0–10.5)

## 2015-03-29 LAB — COMPREHENSIVE METABOLIC PANEL
ALBUMIN: 4.3 g/dL (ref 3.6–5.1)
ALT: 42 U/L (ref 9–46)
AST: 38 U/L — ABNORMAL HIGH (ref 10–35)
Alkaline Phosphatase: 75 U/L (ref 40–115)
BUN: 14 mg/dL (ref 7–25)
CALCIUM: 10.1 mg/dL (ref 8.6–10.3)
CHLORIDE: 96 mmol/L — AB (ref 98–110)
CO2: 27 mmol/L (ref 20–31)
CREATININE: 1.01 mg/dL (ref 0.70–1.33)
Glucose, Bld: 345 mg/dL — ABNORMAL HIGH (ref 65–99)
POTASSIUM: 4.9 mmol/L (ref 3.5–5.3)
SODIUM: 134 mmol/L — AB (ref 135–146)
Total Bilirubin: 0.6 mg/dL (ref 0.2–1.2)
Total Protein: 7.3 g/dL (ref 6.1–8.1)

## 2015-03-29 MED ORDER — LISINOPRIL 40 MG PO TABS
20.0000 mg | ORAL_TABLET | Freq: Every day | ORAL | Status: DC
Start: 2015-03-29 — End: 2015-06-03

## 2015-03-29 NOTE — Assessment & Plan Note (Signed)
Will increase ACE-I. May need to add HCTZ rtc in 2 weeks for BP recheck and BMP

## 2015-03-29 NOTE — Progress Notes (Signed)
   Subjective:    Patient ID: Constantino Starace, male    DOB: [birth year], 54 y.o.   MRN: [id number]  HPI 54 yo M with hx of HIV+ was Dx 1998, and HTN. Hep C 1a, 14.8 million VL October 2015.  He was on atripla til changed to [place] for Hep C rx.    Previously Hep A/B (S Ab)+. He took 1 bottle of harvoni and then thought he was done. He missed the f/u appt and did not receive further rx.    HIV 1 RNA QUANT (copies/mL)  Date Value  03/11/2015 <20  08/10/2014 <20  04/07/2014 42*   CD4 T CELL ABS (/uL)  Date Value  03/11/2015 540  08/10/2014 430  04/07/2014 570    Has been feeling well.  No problems with new ART.  Exercising "the remote control" Is taking his anti-htn rx-   Review of Systems  Constitutional: Negative for appetite change and unexpected weight change.  Respiratory: Negative for shortness of breath.   Cardiovascular: Negative for chest pain and leg swelling.  Gastrointestinal: Negative for diarrhea and constipation.  Genitourinary: Negative for difficulty urinating.  Neurological: Negative for headaches.       Objective:   Physical Exam  Constitutional: He appears well-developed and well-nourished.  HENT:  Mouth/Throat: No oropharyngeal exudate.  Eyes: EOM are normal. Pupils are equal, round, and reactive to light.  Neck: Neck supple.  Cardiovascular: Normal rate, regular rhythm and normal heart sounds.   Pulmonary/Chest: Effort normal and breath sounds normal.  Abdominal: Soft. Bowel sounds are normal. There is no tenderness. There is no rebound.  Musculoskeletal: He exhibits no edema.  Lymphadenopathy:    He has no cervical adenopathy.       Assessment & Plan:

## 2015-03-29 NOTE — Assessment & Plan Note (Addendum)
My great appreciation to pharmacy. He received harvoni 3-14, 4-5 and 4-25.  He did speak with pharm with each of these Will recheck his labs to for test of cure.

## 2015-03-29 NOTE — Assessment & Plan Note (Signed)
He is doing very well. Gets flu shot today.  Offered/refused condoms.  Hep A /b immune, vax.  Will see him back in 6 months.

## 2015-03-31 LAB — HEPATITIS C RNA QUANTITATIVE
HCV QUANT LOG: 7.9 {Log} — AB (ref <1.18)
HCV Quantitative: 79112080 IU/mL — ABNORMAL HIGH (ref <15)

## 2015-04-12 ENCOUNTER — Other Ambulatory Visit: Payer: Medicaid Other

## 2015-04-16 ENCOUNTER — Ambulatory Visit: Payer: Self-pay | Admitting: *Deleted

## 2015-04-16 VITALS — BP 138/86 | HR 80

## 2015-04-16 DIAGNOSIS — I10 Essential (primary) hypertension: Secondary | ICD-10-CM

## 2015-04-23 ENCOUNTER — Other Ambulatory Visit (INDEPENDENT_AMBULATORY_CARE_PROVIDER_SITE_OTHER): Payer: Self-pay

## 2015-04-23 ENCOUNTER — Other Ambulatory Visit (HOSPITAL_COMMUNITY)
Admission: RE | Admit: 2015-04-23 | Discharge: 2015-04-23 | Disposition: A | Discharge status: Home / Self Care | Payer: Medicaid Other | Source: Ambulatory Visit | Attending: Infectious Diseases | Admitting: Infectious Diseases

## 2015-04-23 DIAGNOSIS — Z79899 Other long term (current) drug therapy: Secondary | ICD-10-CM

## 2015-04-23 DIAGNOSIS — Z113 Encounter for screening for infections with a predominantly sexual mode of transmission: Secondary | ICD-10-CM

## 2015-04-23 DIAGNOSIS — B20 Human immunodeficiency virus [HIV] disease: Secondary | ICD-10-CM

## 2015-04-23 LAB — COMPREHENSIVE METABOLIC PANEL
ALT: 38 U/L (ref 9–46)
Albumin: 4.2 g/dL (ref 3.6–5.1)
Alkaline Phosphatase: 66 U/L (ref 40–115)
BUN: 20 mg/dL (ref 7–25)
CHLORIDE: 91 mmol/L — AB (ref 98–110)
CO2: 27 mmol/L (ref 20–31)
CREATININE: 1.23 mg/dL (ref 0.70–1.33)
Calcium: 9.7 mg/dL (ref 8.6–10.3)
GLUCOSE: 460 mg/dL — AB (ref 65–99)
Potassium: 5.7 mmol/L — ABNORMAL HIGH (ref 3.5–5.3)
SODIUM: 129 mmol/L — AB (ref 135–146)
Total Bilirubin: 0.7 mg/dL (ref 0.2–1.2)
Total Protein: 7.3 g/dL (ref 6.1–8.1)

## 2015-04-23 LAB — LIPID PANEL
Cholesterol: 250 mg/dL — ABNORMAL HIGH (ref 125–200)
HDL: 24 mg/dL — ABNORMAL LOW (ref >=40)
TRIGLYCERIDES: 1686 mg/dL — AB (ref <150)
Total CHOL/HDL Ratio: 10.4 Ratio — ABNORMAL HIGH (ref <=5.0)

## 2015-04-23 LAB — CBC
HCT: 44.5 % (ref 39.0–52.0)
Hemoglobin: 15.7 g/dL (ref 13.0–17.0)
MCH: 31.3 pg (ref 26.0–34.0)
MCHC: 35.3 g/dL (ref 30.0–36.0)
MCV: 88.8 fL (ref 78.0–100.0)
MPV: 10.6 fL (ref 8.6–12.4)
PLATELETS: 214 10*3/uL (ref 150–400)
RBC: 5.01 MIL/uL (ref 4.22–5.81)
RDW: 12.8 % (ref 11.5–15.5)
WBC: 5.3 10*3/uL (ref 4.0–10.5)

## 2015-04-23 LAB — T-HELPER CELL (CD4) - (RCID CLINIC ONLY)
CD4 T CELL ABS: 540 /uL (ref 400–2700)
CD4 T CELL HELPER: 29 % — AB (ref 33–55)

## 2015-04-24 ENCOUNTER — Telehealth: Payer: Self-pay | Admitting: Internal Medicine

## 2015-04-24 DIAGNOSIS — R7309 Other abnormal glucose: Secondary | ICD-10-CM

## 2015-04-24 LAB — RPR

## 2015-04-24 NOTE — Telephone Encounter (Signed)
CMP     Component Value Date/Time   NA 129* 04/23/2015 1013   K 5.7* 04/23/2015 1013   CL 91* 04/23/2015 1013   CO2 27 04/23/2015 1013   GLUCOSE 460* 04/23/2015 1013   BUN 20 04/23/2015 1013   CREATININE 1.23 04/23/2015 1013   CREATININE 0.96 08/22/2010 1828   CALCIUM 9.7 04/23/2015 1013   PROT 7.3 04/23/2015 1013   ALBUMIN 4.2 04/23/2015 1013   AST <3* 04/23/2015 1013   ALT 38 04/23/2015 1013   ALKPHOS 66 04/23/2015 1013   BILITOT 0.7 04/23/2015 1013   GFRNONAA >89 08/10/2014 0910   GFRAA >89 08/10/2014 0910   No results found for: HGBA1C   I was called by the lab this morning with Mr. Doane critical glucose value of 460. He has had a fairly long history of hyperglycemia with increase in glucose levels recently. He has not had a formal diagnosis of diabetes. I called him today and he stated that he has been very thirsty and passing his urine more frequently for several months but thought this was due to his blood pressure medication. He does not have a primary care provider. He did not want to come to the emergency department. I encouraged him to stay hydrated. I will have him come back into our clinic for repeat lab work on Monday, 04/26/2015.

## 2015-04-26 ENCOUNTER — Telehealth: Payer: Self-pay | Admitting: Internal Medicine

## 2015-04-26 ENCOUNTER — Other Ambulatory Visit (INDEPENDENT_AMBULATORY_CARE_PROVIDER_SITE_OTHER): Payer: Self-pay

## 2015-04-26 DIAGNOSIS — R7309 Other abnormal glucose: Secondary | ICD-10-CM

## 2015-04-26 LAB — COMPREHENSIVE METABOLIC PANEL
ALT: 35 U/L (ref 9–46)
AST: 19 U/L (ref 10–35)
Albumin: 4 g/dL (ref 3.6–5.1)
Alkaline Phosphatase: 65 U/L (ref 40–115)
BUN: 23 mg/dL (ref 7–25)
CHLORIDE: 90 mmol/L — AB (ref 98–110)
CO2: 25 mmol/L (ref 20–31)
CREATININE: 1.48 mg/dL — AB (ref 0.70–1.33)
Calcium: 9.8 mg/dL (ref 8.6–10.3)
Glucose, Bld: 530 mg/dL (ref 65–99)
Potassium: 4.9 mmol/L (ref 3.5–5.3)
SODIUM: 130 mmol/L — AB (ref 135–146)
TOTAL PROTEIN: 7.3 g/dL (ref 6.1–8.1)
Total Bilirubin: 1.1 mg/dL (ref 0.2–1.2)

## 2015-04-26 LAB — URINE CYTOLOGY ANCILLARY ONLY
CHLAMYDIA, DNA PROBE: NEGATIVE
NEISSERIA GONORRHEA: NEGATIVE

## 2015-04-26 LAB — HIV-1 RNA QUANT-NO REFLEX-BLD: HIV-1 RNA Quant, Log: <1.3 Log copies/mL (ref <1.30)

## 2015-04-26 LAB — HEMOGLOBIN A1C
HEMOGLOBIN A1C: 13.4 % — AB (ref <5.7)
Mean Plasma Glucose: 338 mg/dL — ABNORMAL HIGH (ref <117)

## 2015-04-26 NOTE — Telephone Encounter (Signed)
   Reason for call:   I received a call from [facility] lab with critical value of Glucose 530.  I called John Dickerson, he has had some increased thirst and urinary frequency.  Has been drinking Soft drinks which he thinks is contributing.    Pertinent Data:   Has history of hyperglycemia C/W diabetes, recent visit by Dr Johnnye Sima and Telephone call with Dr Megan Salon following up on hyperglycemia.  No PCP   Assessment / Plan / Recommendations:   I instructed him to increase WATER intake and avoid soft drinks.  I will go ahead and try to get him an appointment in our clinic to establish him with a PCP  Will forward this on to Dr Johnnye Sima.   Lucious Groves, DO   04/26/2015, 8:13 PM

## 2015-04-26 NOTE — Telephone Encounter (Signed)
Thanks [employer]

## 2015-04-27 NOTE — Telephone Encounter (Signed)
Thanks Randall Hiss I appreciate you getting him into IM

## 2015-05-11 ENCOUNTER — Encounter: Payer: Self-pay | Admitting: Internal Medicine

## 2015-05-11 ENCOUNTER — Ambulatory Visit (INDEPENDENT_AMBULATORY_CARE_PROVIDER_SITE_OTHER): Payer: Self-pay | Admitting: Internal Medicine

## 2015-05-11 VITALS — BP 129/74 | HR 65 | Temp 98.2°F | Wt 180.5 lb

## 2015-05-11 DIAGNOSIS — E1159 Type 2 diabetes mellitus with other circulatory complications: Secondary | ICD-10-CM | POA: Insufficient documentation

## 2015-05-11 DIAGNOSIS — E119 Type 2 diabetes mellitus without complications: Secondary | ICD-10-CM

## 2015-05-11 DIAGNOSIS — N521 Erectile dysfunction due to diseases classified elsewhere: Secondary | ICD-10-CM

## 2015-05-11 DIAGNOSIS — I10 Essential (primary) hypertension: Secondary | ICD-10-CM

## 2015-05-11 DIAGNOSIS — Z0000 Encounter for general adult medical examination without abnormal findings: Secondary | ICD-10-CM | POA: Insufficient documentation

## 2015-05-11 DIAGNOSIS — E785 Hyperlipidemia, unspecified: Secondary | ICD-10-CM

## 2015-05-11 LAB — GLUCOSE, CAPILLARY: Glucose-Capillary: 356 mg/dL — ABNORMAL HIGH (ref 65–99)

## 2015-05-11 MED ORDER — METFORMIN HCL 500 MG PO TABS: ORAL_TABLET | ORAL | Status: DC

## 2015-05-11 NOTE — Patient Instructions (Signed)
Thank you for coming in today  - I am going to start you on a new medication for Diabetes called Metformin. For the first week, take one pill in the morning with breakfast. After a week take 1 pill with breakfast and 1 pill with dinner. After another week take 2 pills with breakfast and 1 with dinner and after another week take 2 pills with breakfast and 2 pills with dinner. This medication can cause some GI upset with diarrhea. Your body will get use to it over time and that is why we are starting slowly with it to limit the side effects. -Continue your blood pressure medications as before. You are on Lisinopril 40 mg daily and Atenolol 100 mg twice a day. -Please mail the stool cards back to the clinic once you use them -Come back to clinic in 2 weeks for follow up

## 2015-05-11 NOTE — Progress Notes (Signed)
Patient ID: John Dickerson, male   DOB: Apr 15, 1961, 54 y.o.   MRN: [id number]   Subjective:   Patient ID: John Dickerson male   DOB: [birth year] 54 y.o.   MRN: [id number]  HPI: John Dickerson is a 54 y.o. male with a PMHx of HIV, Hepatitis C, HTN and recently diagnosed DM presents to clinic today to establish care. John Dickerson is followed by Dr. Johnnye Dickerson for his HIV and Hepatitis C. He has recently complained of a 2 week history of polyuria and polydipsia and had his A1c checked and it was found to be 13.4 with his CBG 530. He has a history of hyperglycemia consistent with diabetes. He was referred to our clinic for further management of his DM.   For details of today's visit and the status of his chronic medical issues please refer to the assessment and plan.  Past Medical History  Diagnosis Date  . HIV (human immunodeficiency virus infection) ([place])   . Hepatitis C   . Hypertension   . Diabetes mellitus without complication [facility])    Current Outpatient Prescriptions  Medication Sig Dispense Refill  . atenolol (TENORMIN) 100 MG tablet Take 1 tablet (100 mg total) by mouth daily. 30 tablet 5  . GENVOYA 150-150-200-10 MG TABS tablet TAKE 1 TABLET BY MOUTH DAILY WITH BREAKFAST 30 tablet 5  . lisinopril (PRINIVIL,ZESTRIL) 40 MG tablet Take 0.5 tablets (20 mg total) by mouth daily. 90 tablet 3  . metFORMIN (GLUCOPHAGE) 500 MG tablet Take 1 tablet in the morning with breakfast for 1 week and then 1 tablet with breakfast and dinner for a week 30 tablet 1   No current facility-administered medications for this visit.   Past Surgical History  Procedure Laterality Date  . No past surgeries     Family History  Problem Relation Age of Onset  . Diabetes Mother   . Cancer Mother     unkown type  . Hypertension Father    Social History   Social History  . Marital Status: Single    Spouse Name: N/A  . Number of Children: N/A  . Years of Education: N/A   Social History Main  Topics  . Smoking status: Former Smoker -- 0.30 packs/day for 30 years    Types: Cigarettes    Start date: 07/20/2012  . Smokeless tobacco: Never Used  . Alcohol Use: 0.6 - 1.2 oz/week    1-2 Cans of beer, 0 Standard drinks or equivalent per week     Comment: occasional  . Drug Use: No  . Sexual Activity:    Partners: Female     Comment: pt. given condoms   Other Topics Concern  . None   Social History Narrative   Review of Systems: Review of Systems  Constitutional: Negative for fever, chills, weight loss and malaise/fatigue.  Eyes: Negative for blurred vision and double vision.  Respiratory: Negative for shortness of breath.   Cardiovascular: Negative for chest pain.  Gastrointestinal: Negative for nausea, vomiting, abdominal pain and diarrhea.  Genitourinary: Positive for frequency. Negative for dysuria and urgency.  Neurological: Negative for dizziness, weakness and headaches.  Endo/Heme/Allergies: Positive for polydipsia.   Objective:  Physical Exam: Filed Vitals:   05/11/15 0925 05/11/15 1015  BP: 152/86 129/74  Pulse: 66 65  Temp: 98.2 F (36.8 C)   TempSrc: Oral   Weight: 180 lb 8 oz (81.874 kg)   SpO2: 96%    PHYSICAL EXAM GENERAL- alert, co-operative, appears as stated age, not in any distress. HEENT-  Atraumatic, normocephalic, PERRL, EOMI, oral mucosa appears moist CARDIAC- RRR, no murmurs, rubs or gallops. RESP- Moving equal volumes of air, and clear to auscultation bilaterally, no wheezes or crackles. ABDOMEN- Soft, nontender, bowel sounds present. NEURO- No obvious Cr N abnormality. EXTREMITIES- pulse 2+, symmetric, no pedal edema. SKIN- Warm, dry, No rash or lesion. PSYCH- Normal mood and affect, appropriate thought content and speech.  Assessment & Plan:   Case discussed with Dr. Daryll Drown. Please refer to Problem based charting for further details of today's visit.

## 2015-05-11 NOTE — Assessment & Plan Note (Addendum)
Lab Results  Component Value Date   HGBA1C 13.4* 04/26/2015     Assessment: He has recently complained of a 2 week history of polyuria and polydipsia and had his A1c checked and it was found to be 13.4 with his CBG 530. He has a history of hyperglycemia consistent with diabetes. He is currently on no medications for DM. Since being told about his DM he has cut out soft drinks and been trying to improve his diet. He was able to meet with [name] briefly after our visit for further information about his DM and nutrition.  Plan: Will start him on Metformin 500 mg daily today with instructions to titrate up his dose weekly. Foot exam done today. Will refer for eye exam. Checking microalbuminuria today. RTC in 2 weeks for recheck.

## 2015-05-11 NOTE — Assessment & Plan Note (Addendum)
BP Readings from Last 3 Encounters:  05/11/15 129/74  04/16/15 138/86  03/29/15 172/92    Lab Results  Component Value Date   NA 133* 05/11/2015   K 4.5 05/11/2015   CREATININE 0.98 05/11/2015    Assessment: John Dickerson is currently taking Lisinopril 40 mg daily and atenolol 100 mg daily. He reports compliance with his medications. BP today was initially 152/86 but improved to 129/74 on recheck.   Plan: Continue his current medications. BMP today with improved renal function.

## 2015-05-12 ENCOUNTER — Encounter: Payer: Self-pay | Admitting: Internal Medicine

## 2015-05-12 LAB — BMP8+ANION GAP
Anion Gap: 20 mmol/L — ABNORMAL HIGH (ref 10.0–18.0)
BUN/Creatinine Ratio: 26 — ABNORMAL HIGH (ref 9–20)
BUN: 25 mg/dL — AB (ref 6–24)
CALCIUM: 9.5 mg/dL (ref 8.7–10.2)
CHLORIDE: 92 mmol/L — AB (ref 97–106)
CO2: 21 mmol/L (ref 18–29)
CREATININE: 0.98 mg/dL (ref 0.76–1.27)
GFR calc Af Amer: 99 mL/min/{1.73_m2} (ref >59)
GFR calc non Af Amer: 86 mL/min/{1.73_m2} (ref >59)
GLUCOSE: 388 mg/dL — AB (ref 65–99)
Potassium: 4.5 mmol/L (ref 3.5–5.2)
Sodium: 133 mmol/L — ABNORMAL LOW (ref 136–144)

## 2015-05-12 LAB — LIPID PANEL
CHOLESTEROL TOTAL: 175 mg/dL (ref 100–199)
Chol/HDL Ratio: 7.3 ratio units — ABNORMAL HIGH (ref 0.0–5.0)
HDL: 24 mg/dL — ABNORMAL LOW (ref >39)
Triglycerides: 1038 mg/dL (ref 0–149)

## 2015-05-12 LAB — MICROALBUMIN / CREATININE URINE RATIO
CREATININE, UR: 174.2 mg/dL
MICROALB/CREAT RATIO: 38.2 mg/g{creat} — AB (ref 0.0–30.0)
Microalbumin, Urine: 66.6 ug/mL

## 2015-05-12 NOTE — Assessment & Plan Note (Addendum)
Lipid Panel     Component Value Date/Time   CHOL 175 05/11/2015 1044   CHOL 250* 04/23/2015 1013   TRIG 1038* 05/11/2015 1044   HDL 24* 05/11/2015 1044   HDL 24* 04/23/2015 1013   CHOLHDL 7.3* 05/11/2015 1044   CHOLHDL 10.4* 04/23/2015 1013   VLDL NOT CALC 04/23/2015 1013   LDLCALC Comment 05/11/2015 1044   LDLCALC NOT CALC 04/23/2015 1013   Patient reports he is fasting this morning. Lipid panel from today above, notable for TG of 1038. ASCVD 10-year risk 24.6% with a lifetime risk of 69%. High intensity statin indicated vs fibrate therapy for hypertriglyceridemia.  He is currently on Genvoya for HIV therapy. Will confer with Dr. Johnnye Sima for possible drug interactions before starting any therapy. Will start Gemfibrozil if no contraindications.  ADDENDUM Spoke with Dr. Johnnye Sima, no interactions. Will go start Gemfibrozil 600 mg bid. Recheck lipid panel in 3 months.

## 2015-05-12 NOTE — Assessment & Plan Note (Signed)
Sent home with Stool Cards x 3 Referred for DM eye exam

## 2015-05-18 ENCOUNTER — Telehealth: Payer: Self-pay | Admitting: Dietician

## 2015-05-18 MED ORDER — GEMFIBROZIL 600 MG PO TABS: 600.0000 mg | ORAL_TABLET | Freq: Two times a day (BID) | ORAL | Status: DC

## 2015-05-18 NOTE — Addendum Note (Signed)
Addended by:  Lions on: 05/18/2015 02:39 PM   Modules accepted: Orders

## 2015-05-18 NOTE — Telephone Encounter (Signed)
John Dickerson called asking if his metformin should have ben delivered by now. He was giving the [employer] number in [place] where the Rx was delivered.

## 2015-05-19 ENCOUNTER — Encounter: Payer: Self-pay | Admitting: Infectious Diseases

## 2015-05-19 ENCOUNTER — Ambulatory Visit (INDEPENDENT_AMBULATORY_CARE_PROVIDER_SITE_OTHER): Payer: Self-pay | Admitting: Infectious Diseases

## 2015-05-19 ENCOUNTER — Other Ambulatory Visit: Payer: Self-pay | Admitting: Internal Medicine

## 2015-05-19 ENCOUNTER — Ambulatory Visit (INDEPENDENT_AMBULATORY_CARE_PROVIDER_SITE_OTHER): Payer: Self-pay | Admitting: Dietician

## 2015-05-19 VITALS — BP 144/88 | HR 84 | Temp 98.2°F | Wt 176.0 lb

## 2015-05-19 VITALS — Wt 178.9 lb

## 2015-05-19 DIAGNOSIS — B1710 Acute hepatitis C without hepatic coma: Secondary | ICD-10-CM

## 2015-05-19 DIAGNOSIS — Z113 Encounter for screening for infections with a predominantly sexual mode of transmission: Secondary | ICD-10-CM

## 2015-05-19 DIAGNOSIS — Z79899 Other long term (current) drug therapy: Secondary | ICD-10-CM

## 2015-05-19 DIAGNOSIS — E119 Type 2 diabetes mellitus without complications: Secondary | ICD-10-CM

## 2015-05-19 DIAGNOSIS — B20 Human immunodeficiency virus [HIV] disease: Secondary | ICD-10-CM

## 2015-05-19 LAB — GLUCOSE, CAPILLARY: Glucose-Capillary: 503 mg/dL — ABNORMAL HIGH (ref 65–99)

## 2015-05-19 NOTE — Progress Notes (Signed)
   Subjective:    Patient ID: John Dickerson, male    DOB: [birth year], 54 y.o.   MRN: [id number]  HPI 54 yo M with hx of HIV+ was Dx 1998, and HTN. Hep C 1a, 14.8 million VL October 2015.  He was on atripla til changed to [place] for Hep C rx.  Previously Hep A/B (S Ab)+. He took 1 bottle of harvoni and then thought he was done. He had repeat Hep C RNA 7.9 million 03-2015.  Is doing well today. Denies problems with ART.  Does nt check FSG at home, meeting nutrition today.   HIV 1 RNA QUANT (copies/mL)  Date Value  04/23/2015 <20  03/11/2015 <20  08/10/2014 <20   CD4 T CELL ABS (/uL)  Date Value  04/22/2015 540  03/11/2015 540  08/10/2014 430    Has not had colon Has not had ophtho this year.  Denies sores on feet.   Review of Systems  Constitutional: Negative for fever, chills, appetite change and unexpected weight change.  Respiratory: Negative for shortness of breath.   Cardiovascular: Negative for chest pain.  Gastrointestinal: Negative for diarrhea and constipation.  Genitourinary: Negative for difficulty urinating.  Neurological: Negative for numbness and headaches.       Objective:   Physical Exam  Constitutional: He appears well-developed and well-nourished.  HENT:  Mouth/Throat: No oropharyngeal exudate.  Eyes: EOM are normal. Pupils are equal, round, and reactive to light.  Neck: Neck supple.  Cardiovascular: Normal rate, regular rhythm and normal heart sounds.   Pulmonary/Chest: Effort normal and breath sounds normal.  Abdominal: Soft. Bowel sounds are normal. There is no tenderness. There is no rebound.  Musculoskeletal: He exhibits no edema.  Dry thickened skin on feet, no ulcers.  Oncyhomycosis.   Lymphadenopathy:    He has no cervical adenopathy.          Assessment & Plan:

## 2015-05-19 NOTE — Assessment & Plan Note (Signed)
He is doing well.  Offered/refused condoms.  Has gotten flu shot.  His wife is negative, i offered PrEP but he was not interested.  Will see him back in 6 months.

## 2015-05-19 NOTE — Assessment & Plan Note (Signed)
Will have him meet with pharm to see if we can re-start therapy.

## 2015-05-19 NOTE — Progress Notes (Signed)
Diabetes Self-Management Education  Visit Type: First/Initial  Appt. Start Time: 1030 Appt. End Time: 1110  05/19/2015  [name], identified by name and date of birth, is a 54 y.o. male with a diagnosis of Diabetes: Type 2.   ASSESSMENT  Weight 178 lb 14.4 oz (81.149 kg). Body mass index is 30.69 kg/(m^2).      Diabetes Self-Management Education - 05/19/15 1400    Visit Information   Visit Type First/Initial   Initial Visit   Diabetes Type Type 2   Are you currently following a meal plan? Yes   What type of meal plan do you follow? more water, less starch and bread   Are you taking your medications as prescribed? Yes  he started his metformin yesterday, took his second dose    Date Diagnosed --  04-2015   Health Coping   How would you rate your overall health? Good   Psychosocial Assessment   Patient Belief/Attitude about Diabetes Motivated to manage diabetes   Self-care barriers Lack of material resources   Self-management support Doctor's office;Family;CDE visits   Patient Concerns Nutrition/Meal planning;Healthy Lifestyle;Glycemic Control   Special Needs None   Preferred Learning Style No preference indicated   Learning Readiness Ready   How often do you need to have someone help you when you read instructions, pamphlets, or other written materials from your doctor or pharmacy? 1 - Never   What is the last grade level you completed in school? [id number]   Complications   Last HgB A1C per patient/outside source 13.4 %   How often do you check your blood sugar? 0 times/day (not testing)   Postprandial Blood glucose range (mg/dL) >200   Number of hypoglycemic episodes per month --  0   Number of hyperglycemic episodes per week --  1   Have you had a dilated eye exam in the past 12 months? No   Are you checking your feet? No   Dietary Intake   Breakfast 2 eggs, bacon, juice, coffee,1% milk, grits, tea   Lunch likes sandwiches and chips, tuna, chicken, roast  beef   Snack (afternoon) peanut butter wafer cookies   Dinner- eats out 3-4 times a week, is thinking about eating out less to help save money and eat healthier. chicken fish pork,. canned vegetables, beans, rice   Snack (evening) air popcorn, ice cream   Beverage(s) water, coffee, tea, watered down juices, loves milk, but it gives him gas   Exercise   Exercise Type ADL's;Light (walking / raking leaves)   How many days per week to you exercise? --  20   Patient Education   Previous Diabetes Education No   Nutrition management  Role of diet in the treatment of diabetes and the relationship between the three main macronutrients and blood glucose level;Reviewed blood glucose goals for pre and post meals and how to evaluate the patients' food intake on their blood glucose level.;Meal timing in regards to the patients' current diabetes medication.   Physical activity and exercise  Role of exercise on diabetes management, blood pressure control and cardiac health.   Monitoring Identified appropriate SMBG and/or A1C goals.   Acute complications Discussed and identified patients' treatment of hyperglycemia.   Individualized Goals (developed by patient)   Nutrition Follow meal plan discussed   Outcomes   Expected Outcomes Demonstrated interest in learning. Expect positive outcomes   Future DMSE 4-6 wks   Program Status Not Completed      Individualized Plan for Diabetes  Self-Management Training:   Learning Objective:  Patient will have a greater understanding of diabetes self-management. Patient education plan is to attend individual and/or group sessions per assessed needs and concerns. Nutrition, medications, monitoring, physical activity, how to handle highs and lows, Special care for my body when I have diabetes,  Plan is to return monthly until plan is complete.  Diabetes support plan to be discussed at future visit.    Plan:   Patient Instructions  Please make your follow up doctor's  appointment. Please make an appointment with me for 1 month.   Your blood sugar was 503 today- it is very high, should be 80-130 before meals, < 180 after eating.   Keep taking your metformin and drink lot's of water and Limit fruit, starch and sweets until your visit next week.   Eat all the protein, healthy fats (avocados, nuts, peanut butter ) and vegetables you want.    Things to do that your learning from this visit:  Meal plan basics:   Eat every 4-6 hours during the day- usually 3 meal every day  Try to eat balanced meals- a protein,  a fruit, vegetable, and a whole grain starch  [place] bacon instead of pork bacon, limit bologna and salami better choices are roast beef, [place], choices or ham or tun sandwiches  Use whole grain bread  Cookies that might be better in small portions- ginger snaps, vanilla wafer and graham crackers   Expected Outcomes:  Demonstrated interest in learning. Expect positive outcomes Education material provided: My Plate If problems or questions, patient to contact team via:  Phone Future DSME appointment: 4-6 wks   He had no symptoms today with CBG of 503 after eating an apple and orange this am and drinking tea- which I think he said had no sugar in it. He denied thirst, increased urination, headache or increased hunger.

## 2015-05-19 NOTE — Addendum Note (Signed)
Addended by: HATCHER, JEFFREY C on: 05/19/2015 09:28 AM   Modules accepted: Orders

## 2015-05-19 NOTE — Assessment & Plan Note (Signed)
Has nutritionist f/u today Will try to get him in for ophtho Will try to get him in for podiatry He needs colon

## 2015-05-19 NOTE — Patient Instructions (Addendum)
Please make your follow up doctor's appointment. Please make an appointment with me for 1 month.   Your blood sugar was 503 today- it is very high, should be 80-130 before meals, < 180 after eating.   Keep taking your metformin and drink lot's of water and Limit fruit, starch and sweets until your visit next week.   Eat all the protein, healthy fats (avocados, nuts, peanut butter ) and vegetables you want.    Things to do that your learning from this visit:  Meal plan basics:   Eat every 4-6 hours during the day- usually 3 meal every day  Try to eat balanced meals- a protein,  a fruit, vegetable, and a whole grain starch  [place] bacon instead of pork bacon, limit bologna and salami better choices are roast beef, [place], choices or ham or tun sandwiches  Use whole grain bread  Cookies that might be better in small portions- ginger snaps, vanilla wafer and graham crackers

## 2015-05-24 NOTE — Progress Notes (Signed)
Internal Medicine Clinic Attending  I saw and evaluated the patient.  I personally confirmed the key portions of the history and exam documented by Dr. Boswell and I reviewed pertinent patient test results.  The assessment, diagnosis, and plan were formulated together and I agree with the documentation in the resident's note. 

## 2015-05-25 ENCOUNTER — Ambulatory Visit (INDEPENDENT_AMBULATORY_CARE_PROVIDER_SITE_OTHER): Payer: Self-pay | Admitting: Internal Medicine

## 2015-05-25 ENCOUNTER — Encounter: Payer: Self-pay | Admitting: Internal Medicine

## 2015-05-25 VITALS — BP 136/81 | HR 82 | Temp 97.7°F | Wt 180.0 lb

## 2015-05-25 DIAGNOSIS — E119 Type 2 diabetes mellitus without complications: Secondary | ICD-10-CM

## 2015-05-25 DIAGNOSIS — Z0000 Encounter for general adult medical examination without abnormal findings: Secondary | ICD-10-CM

## 2015-05-25 DIAGNOSIS — Z1211 Encounter for screening for malignant neoplasm of colon: Secondary | ICD-10-CM

## 2015-05-25 DIAGNOSIS — Z23 Encounter for immunization: Secondary | ICD-10-CM

## 2015-05-25 DIAGNOSIS — I10 Essential (primary) hypertension: Secondary | ICD-10-CM

## 2015-05-25 DIAGNOSIS — E781 Pure hyperglyceridemia: Secondary | ICD-10-CM

## 2015-05-25 DIAGNOSIS — Z7984 Long term (current) use of oral hypoglycemic drugs: Secondary | ICD-10-CM

## 2015-05-25 DIAGNOSIS — E1165 Type 2 diabetes mellitus with hyperglycemia: Secondary | ICD-10-CM

## 2015-05-25 LAB — POC HEMOCCULT BLD/STL (HOME/3-CARD/SCREEN)
Card #2 Fecal Occult Blod, POC: NEGATIVE
FECAL OCCULT BLD: NEGATIVE
FECAL OCCULT BLD: NEGATIVE

## 2015-05-25 LAB — GLUCOSE, CAPILLARY: Glucose-Capillary: 351 mg/dL — ABNORMAL HIGH (ref 65–99)

## 2015-05-25 MED ORDER — METFORMIN HCL 1000 MG PO TABS: 1000.0000 mg | ORAL_TABLET | Freq: Two times a day (BID) | ORAL | Status: DC

## 2015-05-25 NOTE — Addendum Note (Signed)
Addended by: Orson Gear on: 05/25/2015 01:18 PM   Modules accepted: Orders

## 2015-05-25 NOTE — Progress Notes (Signed)
Internal Medicine Clinic Attending  I saw and evaluated the patient.  I personally confirmed the key portions of the history and exam documented by Dr. Rice and I reviewed pertinent patient test results.  The assessment, diagnosis, and plan were formulated together and I agree with the documentation in the resident's note.  

## 2015-05-25 NOTE — Assessment & Plan Note (Signed)
Gemfibrozil prescribed at last visit, however the patient uses a mail prescription service and did not receive this at a started a month. Instructed him to expect this and we can assess his progress with lipid profile at the next visit if he has been on the medication and to get a true fasting profile.

## 2015-05-25 NOTE — Patient Instructions (Signed)
Today we discussed your diabetes and recommend increasing your metformin to 1000mg  twice per day. Keep doing a great job reducing sweets and simple starches from the diet and staying active.  We may also check your cholesterol at your next visit after you receive the new medicine. If this is not in your next month's meds please call us at [phone] to sort things out.

## 2015-05-25 NOTE — Assessment & Plan Note (Signed)
Assessment: Recently diagnosed type 2 diabetes mellitus without known complications. Symptoms of polydipsia and urinary frequency or partially improved over the past 2 weeks with taking metformin and working to improve dietary compliance. He met with diabetes educator on November 30 and therefore subsequent visit on diet modification. So far he is also discontinued drinking soft drinks. Random glucose today is 350 down from 500. Given A1c of 13.4% is unlikely he will achieve full control with metformin and dietary modification alone, given the overall mild symptoms of his hyperglycemia aggressive therapy is not required and we can see how he responds to maximal dose treatment. He has been referred for ophthalmology and podiatry appointments that these are not yet scheduled.  Plan: Increase metformin to 1000 mg twice a day with meals Encouraged continued dietary changes Follow-up in 6-8 weeks for repeat A1c and may start second medication as appropriate

## 2015-05-25 NOTE — Assessment & Plan Note (Signed)
Administered TDAP shot today, as the record did not show previous dosing, and the patient had no idea when the last time he received a tetanus booster was.

## 2015-05-25 NOTE — Addendum Note (Signed)
Addended by: Marcelino Duster on: 05/25/2015 11:09 AM   Modules accepted: Orders

## 2015-05-25 NOTE — Progress Notes (Signed)
Subjective:   Patient ID: John Dickerson male   DOB: 1960/09/30 54 y.o.   MRN: [id number]  HPI: [name] is a 54 y.o.  man with HIV, hepatitis C, and hypertension presenting for two-week follow-up of his newly diagnosed type 2 diabetes. He initially presented with symptoms of polydipsia and urinary frequency was found to have a random glucose of 503. He was started on metformin 500 mg and followed up with our diabetes educator to work on his diet and lifestyle modifications. He is compliant without noticing any adverse side effects from his metformin. He thinks his urinary frequency is partially improved compared to several weeks ago. He denies any headache, fatigue, vision changes, or pain or numbness in his feet.  See problem based assessment and plan below for additional details.    Past Medical History  Diagnosis Date  . HIV (human immunodeficiency virus infection) ([place])   . Hepatitis C   . Hypertension   . Diabetes mellitus without complication [facility])    Current Outpatient Prescriptions  Medication Sig Dispense Refill  . atenolol (TENORMIN) 100 MG tablet Take 1 tablet (100 mg total) by mouth daily. 30 tablet 5  . gemfibrozil (LOPID) 600 MG tablet Take 1 tablet (600 mg total) by mouth 2 (two) times daily. 30 minutes before breakfast and dinner. 60 tablet 2  . GENVOYA 150-150-200-10 MG TABS tablet TAKE 1 TABLET BY MOUTH DAILY WITH BREAKFAST 30 tablet 5  . lisinopril (PRINIVIL,ZESTRIL) 40 MG tablet Take 0.5 tablets (20 mg total) by mouth daily. 90 tablet 3  . metFORMIN (GLUCOPHAGE) 500 MG tablet Take 1 tablet in the morning with breakfast for 1 week and then 1 tablet with breakfast and dinner for a week 30 tablet 1   No current facility-administered medications for this visit.   Family History  Problem Relation Age of Onset  . Diabetes Mother   . Cancer Mother     unkown type  . Hypertension Father    Social History   Social History  . Marital Status: Single    Spouse Name: N/A  . Number of Children: N/A  . Years of Education: N/A   Social History Main Topics  . Smoking status: Former Smoker -- 0.30 packs/day for 30 years    Types: Cigarettes    Start date: 07/20/2012  . Smokeless tobacco: Never Used  . Alcohol Use: 0.6 - 1.2 oz/week    1-2 Cans of beer, 0 Standard drinks or equivalent per week     Comment: occasional  . Drug Use: No  . Sexual Activity:    Partners: Female     Comment: pt. given condoms   Other Topics Concern  . None   Social History Narrative   Review of Systems: Review of Systems  Constitutional: Negative for weight loss and malaise/fatigue.  Eyes: Negative for blurred vision and double vision.  Respiratory: Negative for shortness of breath.   Cardiovascular: Negative for leg swelling.  Gastrointestinal: Negative for nausea, abdominal pain and diarrhea.  Genitourinary: Positive for frequency.  Neurological: Negative for dizziness, focal weakness and headaches.  Endo/Heme/Allergies: Positive for polydipsia.    Objective:  Physical Exam: Filed Vitals:   05/25/15 0905  BP: 136/81  Pulse: 82  Temp: 97.7 F (36.5 C)  TempSrc: Oral  Weight: 180 lb (81.647 kg)  SpO2: 100%   GENERAL- alert, co-operative, NAD HEENT- Atraumatic, oral mucosa appears moist, good and intact dentition CARDIAC- RRR, no murmurs, rubs or gallops. RESP- CTAB, no wheezes or crackles. ABDOMEN-  Soft, nontender, no guarding or rebound NEURO- Sensation intact- globally EXTREMITIES- pulse 2+, symmetric, no pedal edema. SKIN- Warm, dry, No rash or lesion. PSYCH- Normal mood and affect, appropriate thought content and speech.    Assessment & Plan:

## 2015-05-25 NOTE — Assessment & Plan Note (Signed)
BP Readings from Last 3 Encounters:  05/25/15 136/81  05/19/15 144/88  05/11/15 129/74    Lab Results  Component Value Date   NA 133* 05/11/2015   K 4.5 05/11/2015   CREATININE 0.98 05/11/2015    Assessment: Blood pressure control: At goal Progress toward BP goal:  Stable Comments: He is on lisinopril 40 mg and atenolol 100 mg daily with no trouble of compliance. Already max dose ACE inhibitor which is very appropriate given microalbuminuria and his newly diagnosed diabetes.  Plan: Medications:  continue current medications

## 2015-05-28 ENCOUNTER — Other Ambulatory Visit: Payer: Self-pay | Admitting: Internal Medicine

## 2015-05-28 NOTE — Telephone Encounter (Signed)
Spoke with pt, RX was sent to [facility], pt would like it sent to local [facility] on [address].  Spoke with local [facility], they have access to script sent 12/6 but pt has to go to specialty pharmacy due to insurance.  Local specialty is on [address], pt informed.  Added this pharmacy to profile. Spoke with [facility] [employer], they will fill for pt.

## 2015-05-28 NOTE — Telephone Encounter (Signed)
Pt requesting metformin to be [facility] @ [facility].

## 2015-05-29 ENCOUNTER — Other Ambulatory Visit: Payer: Self-pay | Admitting: Infectious Diseases

## 2015-05-31 ENCOUNTER — Telehealth: Payer: Self-pay | Admitting: Infectious Diseases

## 2015-05-31 NOTE — Telephone Encounter (Signed)
Patient left a vm on the medication assistance line checking status of refill on his bp medication, says he is completely out.

## 2015-05-31 NOTE — Telephone Encounter (Signed)
Notified patient he does have refills on this medication. He called the pharmacy and it is ready for pick up. Myrtis Hopping

## 2015-06-03 ENCOUNTER — Other Ambulatory Visit: Payer: Self-pay | Admitting: Dietician

## 2015-06-03 DIAGNOSIS — I10 Essential (primary) hypertension: Secondary | ICD-10-CM

## 2015-06-03 MED ORDER — LISINOPRIL 40 MG PO TABS: 40.0000 mg | ORAL_TABLET | Freq: Every day | ORAL | Status: DC

## 2015-06-03 NOTE — Telephone Encounter (Signed)
Says he has been getting 15 pills of 40 mg zestril/month because the instructions per the pharmacy says his prescription reads to take 0.5 tablet (20 mg) and the doctor told him to take 40 mg 1 pill/day. He only has 4 left, requests refill for the 40 mg pill for 30 days

## 2015-06-07 ENCOUNTER — Other Ambulatory Visit: Payer: Self-pay | Admitting: Internal Medicine

## 2015-06-07 NOTE — Telephone Encounter (Signed)
Patient requesting his Lisinopril Refill

## 2015-06-07 NOTE — Telephone Encounter (Signed)
Confirmed with [facility] that RX has been rec'd.  Pt informed and will pick-up

## 2015-06-22 ENCOUNTER — Telehealth: Payer: Self-pay | Admitting: Internal Medicine

## 2015-06-22 NOTE — Telephone Encounter (Signed)
Message left on home phone ID recording - Rx was sent to pharmacy 05/2015 with 3 refills. Directions states one tablet daily - 40 mg.

## 2015-06-22 NOTE — Telephone Encounter (Signed)
Patient requesting a refill on lisinopril.  Patient also has questions about whether or not he needs to take a half of a pill or a 40mg  whole pill.

## 2015-06-23 ENCOUNTER — Telehealth: Payer: Self-pay | Admitting: Dietician

## 2015-06-23 NOTE — Telephone Encounter (Signed)
Patient  voiced concern that he has been having problem getting his lisinopril ever since his dose was changed.  Patient says he cannot afford the 15$ that [facility] charges him for his lisinopril. He says [facility] ( ? What price? ). He Matilde Bash says that he only got 15 pills on 05/31/15 and he is out. CDE advised patient that his [facility] had been [facility] and that if [facility], [facility] can [facility] and ask [facility] and [facility] to them. Advised patient to please contact triage nurse in future.

## 2015-06-24 NOTE — Telephone Encounter (Signed)
Left message on home ID recording - suggest to talk with Dr Maudie Mercury about cost of meds. Call clinic and request to talk with Dr Maudie Mercury.

## 2015-06-30 ENCOUNTER — Other Ambulatory Visit (INDEPENDENT_AMBULATORY_CARE_PROVIDER_SITE_OTHER): Payer: Self-pay

## 2015-06-30 DIAGNOSIS — B182 Chronic viral hepatitis C: Secondary | ICD-10-CM

## 2015-07-01 ENCOUNTER — Ambulatory Visit: Payer: Medicaid Other

## 2015-07-05 LAB — HCV RNA NS5A DRUG RESISTANCE

## 2015-07-06 ENCOUNTER — Ambulatory Visit (INDEPENDENT_AMBULATORY_CARE_PROVIDER_SITE_OTHER): Payer: Self-pay | Admitting: Pulmonary Disease

## 2015-07-06 ENCOUNTER — Encounter: Payer: Self-pay | Admitting: Pulmonary Disease

## 2015-07-06 VITALS — BP 154/89 | HR 58 | Temp 97.8°F | Ht 63.5 in | Wt 183.7 lb

## 2015-07-06 DIAGNOSIS — E781 Pure hyperglyceridemia: Secondary | ICD-10-CM

## 2015-07-06 DIAGNOSIS — E119 Type 2 diabetes mellitus without complications: Secondary | ICD-10-CM

## 2015-07-06 DIAGNOSIS — E1165 Type 2 diabetes mellitus with hyperglycemia: Secondary | ICD-10-CM

## 2015-07-06 DIAGNOSIS — I10 Essential (primary) hypertension: Secondary | ICD-10-CM

## 2015-07-06 DIAGNOSIS — Z7984 Long term (current) use of oral hypoglycemic drugs: Secondary | ICD-10-CM

## 2015-07-06 LAB — GLUCOSE, CAPILLARY: GLUCOSE-CAPILLARY: 313 mg/dL — AB (ref 65–99)

## 2015-07-06 MED ORDER — GEMFIBROZIL 600 MG PO TABS: 600.0000 mg | ORAL_TABLET | Freq: Two times a day (BID) | ORAL | Status: DC

## 2015-07-06 MED ORDER — LISINOPRIL-HYDROCHLOROTHIAZIDE 20-12.5 MG PO TABS: 2.0000 | ORAL_TABLET | Freq: Every day | ORAL | Status: DC

## 2015-07-06 MED ORDER — GLIPIZIDE 5 MG PO TABS: 5.0000 mg | ORAL_TABLET | Freq: Every day | ORAL | Status: DC

## 2015-07-06 NOTE — Patient Instructions (Addendum)
General Instructions:   Thank you for bringing your medicines today. This helps [place] keep you safe from mistakes.   Self Care Goals & Plans:  Self Care Goal 07/06/2015  Manage my medications bring my medications to every visit  Monitor my health -  Eat healthy foods drink diet soda or water instead of juice or soda; eat more vegetables; eat foods that are low in salt; eat baked foods instead of fried foods; eat smaller portions  Be physically active find an activity I enjoy

## 2015-07-06 NOTE — Progress Notes (Signed)
   Subjective:    Patient ID: John Dickerson, male    DOB: Oct 21, 1960, 55 y.o.   MRN: [id number]  HPI [name] is a 55 year old man with history of HIV, hepatitis C, HTN, DM2 presenting for follow up of diabetes.  He was last seen in clinic on 05/25/2015. His metformin was increased to 1000mg  BID. He reports he has been tolerating this well. Polydipsia and polyuria have improved. He has not been watching his diet - eats pastas and occasionally overindulges. He has not gotten any exercise due to the cold weather and snowfall.  Review of Systems Constitutional: no fevers/chills Eyes: no vision changes Respiratory: no shortness of breath Cardiovascular: no chest pain Gastrointestinal: no nausea/vomiting, no abdominal pain  Past Medical History  Diagnosis Date  . HIV (human immunodeficiency virus infection) ([place])   . Hepatitis C   . Hypertension   . Diabetes mellitus without complication [facility])     Current Outpatient Prescriptions on File Prior to Visit  Medication Sig Dispense Refill  . atenolol (TENORMIN) 100 MG tablet TAKE ONE TABLET BY MOUTH ONCE DAILY 30 tablet 5  . gemfibrozil (LOPID) 600 MG tablet Take 1 tablet (600 mg total) by mouth 2 (two) times daily. 30 minutes before breakfast and dinner. 60 tablet 2  . GENVOYA 150-150-200-10 MG TABS tablet TAKE 1 TABLET BY MOUTH DAILY WITH BREAKFAST 30 tablet 5  . lisinopril (PRINIVIL,ZESTRIL) 40 MG tablet Take 1 tablet (40 mg total) by mouth daily. 30 tablet 3  . metFORMIN (GLUCOPHAGE) 1000 MG tablet Take 1 tablet (1,000 mg total) by mouth 2 (two) times daily with a meal. Take 1000 mg with breakfast and 1000 mg with dinner daily. 60 tablet 1   No current facility-administered medications on file prior to visit.    Today's Vitals   07/06/15 0829 07/06/15 0833 07/06/15 0851  BP: 168/79  154/89  Pulse: 59  58  Temp: 97.8 F (36.6 C)    TempSrc: Oral    Height: 5' 3.5" (1.613 m)    Weight: 183 lb 11.2 oz (83.326 kg)      SpO2: 100%    PainSc:  0-No pain    Objective:   Physical Exam  Constitutional: He appears well-developed and well-nourished. No distress.  Cardiovascular: Normal rate, regular rhythm and normal heart sounds.   Pulmonary/Chest: Effort normal and breath sounds normal. He has no wheezes. He has no rales.  Neurological: He is alert.  Psychiatric: He has a normal mood and affect.   Assessment & Plan:  Please refer to problem based charting.

## 2015-07-07 NOTE — Assessment & Plan Note (Signed)
Assessment: He has not received this medication.  Plan: -Resent medication to pharmacy.

## 2015-07-07 NOTE — Assessment & Plan Note (Signed)
BP Readings from Last 3 Encounters:  07/06/15 154/89  05/25/15 136/81  05/19/15 144/88    Lab Results  Component Value Date   NA 133* 05/11/2015   K 4.5 05/11/2015   CREATININE 0.98 05/11/2015    Assessment: Blood pressure control: mildly elevated Progress toward BP goal:  deteriorated  Plan: Medications:  Continue atenolol 100mg  daily. Change lisinopril 40mg  to lisinopril-HCTZ 20-12.5 mg 2 tablets daily. Other plans:  -Follow up in 1 month

## 2015-07-07 NOTE — Assessment & Plan Note (Signed)
Assessment: Fasting blood glucose this morning 313. Compliant with metformin 1000mg  BID.  Plan: -Continue metformin 1000mg  BID with meals. -Start glipizide 5mg  daily -Follow up in 1 month. Encouraged him to

## 2015-07-08 LAB — HEPATITIS C VIRAL RNA NS3 GENOTYPE

## 2015-07-08 NOTE — Progress Notes (Signed)
Case discussed with Dr. Krall at the time of the visit. We reviewed the resident's history and exam and pertinent patient test results. I agree with the assessment, diagnosis, and plan of care documented in the resident's note. 

## 2015-07-22 ENCOUNTER — Other Ambulatory Visit: Payer: Self-pay | Admitting: Internal Medicine

## 2015-08-02 ENCOUNTER — Ambulatory Visit: Payer: Medicaid Other

## 2015-08-02 ENCOUNTER — Other Ambulatory Visit: Payer: Self-pay | Admitting: Pharmacist Clinician (PhC)/ Clinical Pharmacy Specialist

## 2015-08-02 MED ORDER — ELBASVIR-GRAZOPREVIR 50-100 MG PO TABS: 1.0000 | ORAL_TABLET | Freq: Every day | ORAL | Status: DC

## 2015-08-02 MED ORDER — SOFOSBUVIR 400 MG PO TABS: 1.0000 | ORAL_TABLET | Freq: Every day | ORAL | Status: DC

## 2015-08-02 MED ORDER — RIBAVIRIN 200 MG PO CAPS: 600.0000 mg | ORAL_CAPSULE | Freq: Two times a day (BID) | ORAL | Status: DC

## 2015-08-02 MED FILL — RIBAVIRIN 200 MG TABLET: 200 | 28 days supply | Qty: 168 | Fill #0

## 2015-08-03 ENCOUNTER — Ambulatory Visit (INDEPENDENT_AMBULATORY_CARE_PROVIDER_SITE_OTHER): Payer: Self-pay | Admitting: Internal Medicine

## 2015-08-03 ENCOUNTER — Ambulatory Visit: Payer: Self-pay | Admitting: Pharmacist Clinician (PhC)/ Clinical Pharmacy Specialist

## 2015-08-03 ENCOUNTER — Encounter: Payer: Self-pay | Admitting: Internal Medicine

## 2015-08-03 VITALS — BP 109/75 | HR 64 | Temp 98.2°F | Ht 63.0 in | Wt 180.2 lb

## 2015-08-03 DIAGNOSIS — I10 Essential (primary) hypertension: Secondary | ICD-10-CM

## 2015-08-03 DIAGNOSIS — B1710 Acute hepatitis C without hepatic coma: Secondary | ICD-10-CM

## 2015-08-03 DIAGNOSIS — E119 Type 2 diabetes mellitus without complications: Secondary | ICD-10-CM

## 2015-08-03 DIAGNOSIS — Z7984 Long term (current) use of oral hypoglycemic drugs: Secondary | ICD-10-CM

## 2015-08-03 DIAGNOSIS — B20 Human immunodeficiency virus [HIV] disease: Secondary | ICD-10-CM

## 2015-08-03 DIAGNOSIS — E785 Hyperlipidemia, unspecified: Secondary | ICD-10-CM

## 2015-08-03 LAB — GLUCOSE, CAPILLARY: Glucose-Capillary: 123 mg/dL — ABNORMAL HIGH (ref 65–99)

## 2015-08-03 LAB — POCT GLYCOSYLATED HEMOGLOBIN (HGB A1C): Hemoglobin A1C: 8.4

## 2015-08-03 MED ORDER — RALTEGRAVIR POTASSIUM 400 MG PO TABS: 400.0000 mg | ORAL_TABLET | Freq: Two times a day (BID) | ORAL | Status: DC

## 2015-08-03 MED ORDER — DOLUTEGRAVIR SODIUM 50 MG PO TABS: 50.0000 mg | ORAL_TABLET | Freq: Every day | ORAL | Status: DC

## 2015-08-03 MED ORDER — BLOOD GLUCOSE MONITOR KIT: PACK | Status: AC

## 2015-08-03 MED ORDER — EMTRICITABINE-TENOFOVIR AF 200-25 MG PO TABS: 1.0000 | ORAL_TABLET | Freq: Every day | ORAL | Status: DC

## 2015-08-03 NOTE — Assessment & Plan Note (Signed)
Lab Results  Component Value Date   HGBA1C 8.4 08/03/2015   HGBA1C 13.4* 04/26/2015     Assessment: Diabetes control:  Remarkable improvement after adding 5mg  Glipizide 1 month ago. Comments: pt congratulated on compliance.  Plan: Medications:  continue current medications- Metformin and glipizide. No side effects. Other plans: will order glucometer, alcohol swabs, lancets and strips.  - Pt to check his blood sugars every morning. - see in 1 month with log. - Can increase glipizide dose - Encouraged weightloss, diet and exercise.

## 2015-08-03 NOTE — Patient Instructions (Addendum)
Your Hgba1c today is 8.4 marked improvement from 13 previously.   Please work on diet, exercise and weightloss, this is very important, as this will help control your blood sugars.  We will like you to check your blood sugars in the mornings.   You will also be called about your colonoscopy.    Calorie Counting for Weight Loss Calories are energy you get from the things you eat and drink. Your body uses this energy to keep you going throughout the day. The number of calories you eat affects your weight. When you eat more calories than your body needs, your body stores the extra calories as fat. When you eat fewer calories than your body needs, your body burns fat to get the energy it needs. Calorie counting means keeping track of how many calories you eat and drink each day. If you make sure to eat fewer calories than your body needs, you should lose weight. In order for calorie counting to work, you will need to eat the number of calories that are right for you in a day to lose a healthy amount of weight per week. A healthy amount of weight to lose per week is usually 1-2 lb (0.5-0.9 kg). A dietitian can determine how many calories you need in a day and give you suggestions on how to reach your calorie goal.  WHAT IS MY MY PLAN? My goal is to have __________ calories per day.  If I have this many calories per day, I should lose around __________ pounds per week. WHAT DO I NEED TO KNOW ABOUT CALORIE COUNTING? In order to meet your daily calorie goal, you will need to:  Find out how many calories are in each food you would like to eat. Try to do this before you eat.  Decide how much of the food you can eat.  Write down what you ate and how many calories it had. Doing this is called keeping a food log. WHERE DO I FIND CALORIE INFORMATION? The number of calories in a food can be found on a Nutrition Facts label. Note that all the information on a label is based on a specific serving of the  food. If a food does not have a Nutrition Facts label, try to look up the calories online or ask your dietitian for help. HOW DO I DECIDE HOW MUCH TO EAT? To decide how much of the food you can eat, you will need to consider both the number of calories in one serving and the size of one serving. This information can be found on the Nutrition Facts label. If a food does not have a Nutrition Facts label, look up the information online or ask your dietitian for help. Remember that calories are listed per serving. If you choose to have more than one serving of a food, you will have to multiply the calories per serving by the amount of servings you plan to eat. For example, the label on a package of bread might say that a serving size is 1 slice and that there are 90 calories in a serving. If you eat 1 slice, you will have eaten 90 calories. If you eat 2 slices, you will have eaten 180 calories. HOW DO I KEEP A FOOD LOG? After each meal, record the following information in your food log:  What you ate.  How much of it you ate.  How many calories it had.  Then, add up your calories. Keep your food log near  you, such as in a small notebook in your pocket. Another option is to use a mobile app or website. Some programs will calculate calories for you and show you how many calories you have left each time you add an item to the log. WHAT ARE SOME CALORIE COUNTING TIPS?  Use your calories on foods and drinks that will fill you up and not leave you hungry. Some examples of this include foods like nuts and nut butters, vegetables, lean proteins, and high-fiber foods (more than 5 g fiber per serving).  Eat nutritious foods and avoid empty calories. Empty calories are calories you get from foods or beverages that do not have many nutrients, such as candy and soda. It is better to have a nutritious high-calorie food (such as an avocado) than a food with few nutrients (such as a bag of chips).  Know how many  calories are in the foods you eat most often. This way, you do not have to look up how many calories they have each time you eat them.  Look out for foods that may seem like low-calorie foods but are really high-calorie foods, such as baked goods, soda, and fat-free candy.  Pay attention to calories in drinks. Drinks such as sodas, specialty coffee drinks, alcohol, and juices have a lot of calories yet do not fill you up. Choose low-calorie drinks like water and diet drinks.  Focus your calorie counting efforts on higher calorie items. Logging the calories in a garden salad that contains only vegetables is less important than calculating the calories in a milk shake.  Find a way of tracking calories that works for you. Get creative. Most people who are successful find ways to keep track of how much they eat in a day, even if they do not count every calorie. WHAT ARE SOME PORTION CONTROL TIPS?  Know how many calories are in a serving. This will help you know how many servings of a certain food you can have.  Use a measuring cup to measure serving sizes. This is helpful when you start out. With time, you will be able to estimate serving sizes for some foods.  Take some time to put servings of different foods on your favorite plates, bowls, and cups so you know what a serving looks like.  Try not to eat straight from a bag or box. Doing this can lead to overeating. Put the amount you would like to eat in a cup or on a plate to make sure you are eating the right portion.  Use smaller plates, glasses, and bowls to prevent overeating. This is a quick and easy way to practice portion control. If your plate is smaller, less food can fit on it.  Try not to multitask while eating, such as watching TV or using your computer. If it is time to eat, sit down at a table and enjoy your food. Doing this will help you to start recognizing when you are full. It will also make you more aware of what and how much  you are eating. HOW CAN I CALORIE COUNT WHEN EATING OUT?  Ask for smaller portion sizes or child-sized portions.  Consider sharing an entree and sides instead of getting your own entree.  If you get your own entree, eat only half. Ask for a box at the beginning of your meal and put the rest of your entree in it so you are not tempted to eat it.  Look for the calories on the  menu. If calories are listed, choose the lower calorie options.  Choose dishes that include vegetables, fruits, whole grains, low-fat dairy products, and lean protein. Focusing on smart food choices from each of the 5 food groups can help you stay on track at restaurants.  Choose items that are boiled, broiled, grilled, or steamed.  Choose water, milk, unsweetened iced tea, or other drinks without added sugars. If you want an alcoholic beverage, choose a lower calorie option. For example, a regular margarita can have up to 700 calories and a glass of wine has around 150.  Stay away from items that are buttered, battered, fried, or served with cream sauce. Items labeled "crispy" are usually fried, unless stated otherwise.  Ask for dressings, sauces, and syrups on the side. These are usually very high in calories, so do not eat much of them.  Watch out for salads. Many people think salads are a healthy option, but this is often not the case. Many salads come with bacon, fried chicken, lots of cheese, fried chips, and dressing. All of these items have a lot of calories. If you want a salad, choose a garden salad and ask for grilled meats or steak. Ask for the dressing on the side, or ask for olive oil and vinegar or lemon to use as dressing.  Estimate how many servings of a food you are given. For example, a serving of cooked rice is  cup or about the size of half a tennis ball or one cupcake wrapper. Knowing serving sizes will help you be aware of how much food you are eating at restaurants. The list below tells you how big  or small some common portion sizes are based on everyday objects.  1 oz--4 stacked dice.  3 oz--1 deck of cards.  1 tsp--1 dice.  1 Tbsp-- a Ping-Pong ball.  2 Tbsp--1 Ping-Pong ball.   cup--1 tennis ball or 1 cupcake wrapper.  1 cup--1 baseball.   This information is not intended to replace advice given to you by your health care provider. Make sure you discuss any questions you have with your health care provider.   Document Released: 06/05/2005 Document Revised: 06/26/2014 Document Reviewed: 04/10/2013 Elsevier Interactive Patient Education [employer].

## 2015-08-03 NOTE — Progress Notes (Signed)
HPI: John Dickerson is a 55 y.o. male who is here to see pharmacy about discussing his new regimen after he failed the first round with Harvoni.   Allergies: No Known Allergies  Vitals: Temp: 98.2 F (36.8 C) (02/14 0933) Temp Source: Oral (02/14 0933) BP: 109/75 mmHg (02/14 0933) Pulse Rate: 64 (02/14 0933)  Past Medical History: Past Medical History  Diagnosis Date  . HIV (human immunodeficiency virus infection) ([place])   . Hepatitis C   . Hypertension   . Diabetes mellitus without complication [facility])     Social History: Social History   Social History  . Marital Status: Single    Spouse Name: N/A  . Number of Children: N/A  . Years of Education: N/A   Social History Main Topics  . Smoking status: Former Smoker -- 0.30 packs/day for 30 years    Types: Cigarettes    Start date: 07/20/2012  . Smokeless tobacco: Never Used  . Alcohol Use: 0.6 - 1.2 oz/week    1-2 Cans of beer, 0 Standard drinks or equivalent per week     Comment: occasional  . Drug Use: No  . Sexual Activity:    Partners: Female     Comment: pt. given condoms   Other Topics Concern  . Not on file   Social History Narrative    Previous Regimen:   Current Regimen: Genvoya  Labs: HIV 1 RNA QUANT (copies/mL)  Date Value  04/23/2015 <20  03/11/2015 <20  08/10/2014 <20   CD4 T CELL ABS (/uL)  Date Value  04/22/2015 540  03/11/2015 540  08/10/2014 430   HEP B S AB (no units)  Date Value  08/13/2006 YES   HEPATITIS B SURFACE AG (no units)  Date Value  08/13/2006 No   HCV AB (no units)  Date Value  08/13/2006 YES    CrCl: CrCl cannot be calculated (Patient has no serum creatinine result on file.).  Lipids:    Component Value Date/Time   CHOL 175 05/11/2015 1044   CHOL 250* 04/23/2015 1013   TRIG 1038* 05/11/2015 1044   HDL 24* 05/11/2015 1044   HDL 24* 04/23/2015 1013   CHOLHDL 7.3* 05/11/2015 1044   CHOLHDL 10.4* 04/23/2015 1013   VLDL NOT CALC 04/23/2015 1013   LDLCALC Comment 05/11/2015 1044   LDLCALC NOT CALC 04/23/2015 1013    Assessment: John Dickerson unfortunately had a relapse with his hep C after taking for one month only since he thought that he was done. He has developed significant NS5A now with probable resistance to almost all of them. He still retains activity to all NS3 drugs. We are planning to use a combo of Zepatier + Solvadi + Ribavirin x 16 wks based on recent data. John Dickerson has been doing really well on Genvoya for his HIV, however, the cobicistat would be a major issue with Zepatier. We were planning to change his regimen to DTG + Descovy but he is also on metformin 2g/day. This would be an issue so we are going to use RAL instead. Told him once he is done with his hep C treatment, he can go back to [place]. Made him a new med calendar and explained it to him several times. He understand the situation. Rx for ART has been sent to [facility]. He is going to pick up ribavirin at [facility] today. Going to send in the patient assistance for Sovaldi and Zepatier today.   Recommendations:  Start process for Zepatier/Sovaldi/Riba x 16 wks Change Genvoya  to RAL/Descovy  Wilfred Lacy, PharmD Clinical Infectious [facility] 08/03/2015, 10:56 AM

## 2015-08-03 NOTE — Assessment & Plan Note (Signed)
Markedly Elevated triglycerides- 05/11/15. On fibrates- gemfibrozil, started last visit, pt complaint.   Plan - Lipid panel next visit.

## 2015-08-03 NOTE — Assessment & Plan Note (Signed)
BP Readings from Last 3 Encounters:  08/03/15 109/75  07/06/15 154/89  05/25/15 136/81    Lab Results  Component Value Date   NA 133* 05/11/2015   K 4.5 05/11/2015   CREATININE 0.98 05/11/2015    Assessment: Blood pressure control:  Controlled, slightly on the low side, but pt denies dizziness. Other plans: Bmet today - Cont meds - if pt complains of dizzines adjust meds, pts told.

## 2015-08-03 NOTE — Progress Notes (Signed)
Patient ID: John Dickerson, male   DOB: 09/08/1960, 55 y.o.   MRN: [id number]   Subjective:   Patient ID: John Dickerson male   DOB: [birth year] 55 y.o.   MRN: [id number]  HPI: [name] is a 55 y.o. with PMH listed below, here to follow up on of HTN and DM. Please see problem based charting for details on problems addressed today.  Past Medical History  Diagnosis Date  . HIV (human immunodeficiency virus infection) ([place])   . Hepatitis C   . Hypertension   . Diabetes mellitus without complication [facility])    Current Outpatient Prescriptions  Medication Sig Dispense Refill  . atenolol (TENORMIN) 100 MG tablet TAKE ONE TABLET BY MOUTH ONCE DAILY 30 tablet 5  . Elbasvir-Grazoprevir (ZEPATIER) 50-100 MG TABS Take 1 tablet by mouth daily. 28 tablet 3  . gemfibrozil (LOPID) 600 MG tablet Take 1 tablet (600 mg total) by mouth 2 (two) times daily. 30 minutes before breakfast and dinner. 60 tablet 2  . GENVOYA 150-150-200-10 MG TABS tablet TAKE 1 TABLET BY MOUTH DAILY WITH BREAKFAST 30 tablet 5  . glipiZIDE (GLUCOTROL) 5 MG tablet Take 1 tablet (5 mg total) by mouth daily before breakfast. 30 tablet 1  . lisinopril-hydrochlorothiazide (PRINZIDE,ZESTORETIC) 20-12.5 MG tablet Take 2 tablets by mouth daily. 60 tablet 1  . metFORMIN (GLUCOPHAGE) 1000 MG tablet TAKE 1 TABLET BY MOUTH TWICE DAILY WITH BREAKFAST AND DINNER 60 tablet 2  . ribavirin (REBETOL) 200 MG capsule Take 3 capsules (600 mg total) by mouth 2 (two) times daily with a meal. 168 capsule 3  . Sofosbuvir (SOVALDI) 400 MG TABS Take 1 tablet by mouth daily. 28 tablet 3   No current facility-administered medications for this visit.   Family History  Problem Relation Age of Onset  . Diabetes Mother   . Cancer Mother     unkown type  . Hypertension Father    Social History   Social History  . Marital Status: Single    Spouse Name: N/A  . Number of Children: N/A  . Years of Education: N/A   Social History Main Topics   . Smoking status: Former Smoker -- 0.30 packs/day for 30 years    Types: Cigarettes    Start date: 07/20/2012  . Smokeless tobacco: Never Used  . Alcohol Use: 0.6 - 1.2 oz/week    1-2 Cans of beer, 0 Standard drinks or equivalent per week     Comment: occasional  . Drug Use: No  . Sexual Activity:    Partners: Female     Comment: pt. given condoms   Other Topics Concern  . None   Social History Narrative   Review of Systems: CONSTITUTIONAL- No Fever, weightloss SKIN- No Rash, colour changes or itching. HEAD- No Headache or dizziness. Mouth/throat- No Sorethroat, dentures, or bleeding gums. RESPIRATORY- No Cough or SOB. CARDIAC- No Palpitations, or chest pain. GI- No vomiting, diarrhoea, abd pain. URINARY- No Frequency,  or dysuria. [facility]- Denies depression or anxiety.  Objective:  Physical Exam: Filed Vitals:   08/03/15 0933  BP: 109/75  Pulse: 64  Temp: 98.2 F (36.8 C)  TempSrc: Oral  Height: 5\' 3"  (1.6 m)  Weight: 180 lb 3.2 oz (81.738 kg)  SpO2: 100%   GENERAL- alert, co-operative, appears as stated age, not in any distress. HEENT- Atraumatic, normocephalic, PERRL,  neck supple. CARDIAC- RRR, no murmurs, rubs or gallops. RESP- Moving equal volumes of air, and clear to auscultation bilaterally, no wheezes or crackles. ABDOMEN- Soft,  nontender,  bowel sounds present. NEURO- No obvious Cr N abnormality, strenght upper and lower extremities-intact Gait- Normal. EXTREMITIES- warnm, no pedal edema. SKIN- Warm, dry, No rash or lesion. PSYCH- Normal mood and affect, appropriate thought content and speech.  Assessment & Plan:   The patient's case and plan of care was discussed with attending physician, Dr. Daryll Drown.  Please see problem based charting for assessment and plan.

## 2015-08-03 NOTE — Patient Instructions (Signed)
Change HIV medications to Isentress and Descovy Wait to hear back from [place] about your hepatitis C medications

## 2015-08-06 ENCOUNTER — Telehealth: Payer: Self-pay | Admitting: Pharmacy Technician

## 2015-08-06 NOTE — Telephone Encounter (Signed)
I called John Dickerson to let him know he is approved through [place] to get Hep C meds at no cost.  He has already picked up Ribavirin at [facility].  I gave him the phone numbers, told him it will be for 16 weeks and that he will need to call to get refills each month.  He said he will call.

## 2015-08-07 NOTE — Progress Notes (Signed)
Internal Medicine Clinic Attending  Case discussed with Dr. Emokpae soon after the resident saw the patient.  We reviewed the resident's history and exam and pertinent patient test results.  I agree with the assessment, diagnosis, and plan of care documented in the resident's note. 

## 2015-08-12 ENCOUNTER — Ambulatory Visit: Payer: Medicaid Other

## 2015-08-16 ENCOUNTER — Encounter: Payer: Self-pay | Admitting: Pharmacy Technician

## 2015-08-19 NOTE — Addendum Note (Signed)
Addended by: Hulan Fray on: 08/19/2015 03:26 PM   Modules accepted: Orders

## 2015-08-23 ENCOUNTER — Other Ambulatory Visit: Payer: Self-pay | Admitting: Infectious Diseases

## 2015-08-26 ENCOUNTER — Telehealth: Payer: Self-pay | Admitting: Internal Medicine

## 2015-08-27 ENCOUNTER — Other Ambulatory Visit: Payer: Self-pay | Admitting: *Deleted

## 2015-08-27 MED ORDER — LISINOPRIL-HYDROCHLOROTHIAZIDE 20-12.5 MG PO TABS: 2.0000 | ORAL_TABLET | Freq: Every day | ORAL | Status: DC

## 2015-08-27 MED ORDER — GLIPIZIDE 5 MG PO TABS: 5.0000 mg | ORAL_TABLET | Freq: Every day | ORAL | Status: DC

## 2015-08-27 NOTE — Telephone Encounter (Signed)
Last appt 08/03/15 with Dr Denton Brick.

## 2015-08-30 MED FILL — RIBAVIRIN 200 MG TABLET: 200 | 28 days supply | Qty: 168 | Fill #1

## 2015-09-02 ENCOUNTER — Ambulatory Visit (INDEPENDENT_AMBULATORY_CARE_PROVIDER_SITE_OTHER): Payer: Self-pay | Admitting: Pharmacist Clinician (PhC)/ Clinical Pharmacy Specialist

## 2015-09-02 ENCOUNTER — Other Ambulatory Visit (INDEPENDENT_AMBULATORY_CARE_PROVIDER_SITE_OTHER): Payer: Self-pay

## 2015-09-02 DIAGNOSIS — B20 Human immunodeficiency virus [HIV] disease: Secondary | ICD-10-CM

## 2015-09-02 DIAGNOSIS — B182 Chronic viral hepatitis C: Secondary | ICD-10-CM

## 2015-09-02 LAB — CBC
HCT: 32.5 % — ABNORMAL LOW (ref 39.0–52.0)
Hemoglobin: 11.1 g/dL — ABNORMAL LOW (ref 13.0–17.0)
MCH: 30.5 pg (ref 26.0–34.0)
MCHC: 34.2 g/dL (ref 30.0–36.0)
MCV: 89.3 fL (ref 78.0–100.0)
MPV: 9.3 fL (ref 8.6–12.4)
PLATELETS: 447 10*3/uL — AB (ref 150–400)
RBC: 3.64 MIL/uL — AB (ref 4.22–5.81)
RDW: 14.1 % (ref 11.5–15.5)
WBC: 7.4 10*3/uL (ref 4.0–10.5)

## 2015-09-02 NOTE — Progress Notes (Addendum)
Patient ID: John Dickerson, male   DOB: [birth year], 55 y.o.   MRN: [id number] HPI: John Dickerson is a 55 y.o. male who is here to f/u for his hep C retreatment appt with pharmacy.   Lab Results  Component Value Date   HCVGENOTYPE 1a 04/07/2014    Allergies: No Known Allergies  Vitals:    Past Medical History: Past Medical History  Diagnosis Date  . HIV (human immunodeficiency virus infection) ([place])   . Hepatitis C   . Hypertension   . Diabetes mellitus without complication [facility])     Social History: Social History   Social History  . Marital Status: Single    Spouse Name: N/A  . Number of Children: N/A  . Years of Education: N/A   Social History Main Topics  . Smoking status: Former Smoker -- 0.30 packs/day for 30 years    Types: Cigarettes    Start date: 07/20/2012  . Smokeless tobacco: Never Used  . Alcohol Use: 0.6 - 1.2 oz/week    1-2 Cans of beer, 0 Standard drinks or equivalent per week     Comment: occasional  . Drug Use: No  . Sexual Activity:    Partners: Female     Comment: pt. given condoms   Other Topics Concern  . Not on file   Social History Narrative    Labs: HIV 1 RNA QUANT (copies/mL)  Date Value  04/23/2015 <20  03/11/2015 <20  08/10/2014 <20   CD4 T CELL ABS (/uL)  Date Value  04/22/2015 540  03/11/2015 540  08/10/2014 430   HEP B S AB (no units)  Date Value  08/13/2006 YES   HEPATITIS B SURFACE AG (no units)  Date Value  08/13/2006 No   HCV AB (no units)  Date Value  08/13/2006 YES    Lab Results  Component Value Date   HCVGENOTYPE 1a 04/07/2014    Hepatitis C RNA quantitative Latest Ref Rng 03/29/2015 04/07/2014  HCV Quantitative <15 IU/mL [id number]) [id number])  HCV Quantitative Log <1.18 log 10 7.90(H) 7.17(H)    AST (U/L)  Date Value  04/26/2015 19  04/23/2015 <3*  03/29/2015 38*   ALT (U/L)  Date Value  04/26/2015 35  04/23/2015 38  03/29/2015 42   INR (no units)  Date Value  05/12/2014  0.94    CrCl: CrCl cannot be calculated (Unknown ideal weight.).  Fibrosis Score: F2/3 as assessed by [facility]   Child-Pugh Score: Class A  Previous Treatment Regimen: Harvoni x 1 month  Assessment: John Dickerson is here for his f/u for his hep C re-treatment. He started Zepatier/Sovaldi/Ribavirin on 2/25. We are planning to treat him for 16 wks with the combo. He hasn't had any side effects issue and has not missed any doses. I was able to identify all of his meds. He has also been very compliance with his ART also. Genvoya>> RAL + Descovy due to interaction issues with both Metformin and hep C regimen. He also has some questions about his blood glucose. It's being managed by [facility] internal medicine. He doesn't have a glucometer. Advised him to called the clinic there and see if they can get him one with their resources. His A1C in Nov was 13 and dropped down to 8.4 in feb. We are going to check his CBC today since he is on ribavirin. He is going to come back in 2 wks for hep C VL.   Recommendations:  Cont Zepatier/Sovaldi/Riba x 16 wks Cont RAL/Descovy for HIV CBC, HIV  VL today F/u with internal medicine for glucometer  [place], [place], [place].D., BCPS, AAHIVP Clinical Infectious [place] for Infectious Disease 09/02/2015, 9:18 AM

## 2015-09-02 NOTE — Patient Instructions (Signed)
Continue to take Zepatier, Sovaldi, and ribavirin to COMPLETE 4 months Cont Isentress and Descovy

## 2015-09-03 ENCOUNTER — Telehealth: Payer: Self-pay | Admitting: Pharmacist Clinician (PhC)/ Clinical Pharmacy Specialist

## 2015-09-03 ENCOUNTER — Telehealth: Payer: Self-pay | Admitting: Dietician

## 2015-09-03 LAB — HIV-1 RNA QUANT-NO REFLEX-BLD
HIV 1 RNA Quant: 61 copies/mL — ABNORMAL HIGH (ref <20)
HIV-1 RNA QUANT, LOG: 1.79 {Log_copies}/mL — AB (ref <1.30)

## 2015-09-03 NOTE — Telephone Encounter (Signed)
Patient wanted to know where to get a meter, how to use it and understand the results and when he should be seen here in our office again. Noted that his meter was called to incorrect pharmacy(he has orange card) . Left message for [facility] department for diabetes meter and supply RX. Asked patient to call front offiv and schedule an appointment with a doctor as he is due to bee seen now and diabetes educator on the same day, he agreed to do so.

## 2015-09-03 NOTE — Telephone Encounter (Signed)
John Dickerson came yesterday for a f/u of his hep C treatment. Currently on Zepatier/Riba/SOF for the re-treatment. Hgb showed a drop to 11.1. It was 15.7 in 11/16. D/w Dr. Linus Salmons, we are going to reduce the ribavirin to 600mg  qday. Recheck CBC in 10 days. Pt understood and will start the new dose of ribavirin today.

## 2015-09-13 ENCOUNTER — Other Ambulatory Visit: Payer: Medicaid Other

## 2015-09-14 ENCOUNTER — Other Ambulatory Visit: Payer: Self-pay

## 2015-09-14 DIAGNOSIS — B182 Chronic viral hepatitis C: Secondary | ICD-10-CM

## 2015-09-14 LAB — CBC
HCT: 30.3 % — ABNORMAL LOW (ref 39.0–52.0)
Hemoglobin: 10.2 g/dL — ABNORMAL LOW (ref 13.0–17.0)
MCH: 30.4 pg (ref 26.0–34.0)
MCHC: 33.7 g/dL (ref 30.0–36.0)
MCV: 90.4 fL (ref 78.0–100.0)
MPV: 8.7 fL (ref 8.6–12.4)
PLATELETS: 530 10*3/uL — AB (ref 150–400)
RBC: 3.35 MIL/uL — ABNORMAL LOW (ref 4.22–5.81)
RDW: 15.9 % — ABNORMAL HIGH (ref 11.5–15.5)
WBC: 6.8 10*3/uL (ref 4.0–10.5)

## 2015-09-15 ENCOUNTER — Other Ambulatory Visit: Payer: Self-pay | Admitting: Pharmacist Clinician (PhC)/ Clinical Pharmacy Specialist

## 2015-09-15 ENCOUNTER — Telehealth: Payer: Self-pay | Admitting: Pharmacist Clinician (PhC)/ Clinical Pharmacy Specialist

## 2015-09-15 DIAGNOSIS — B182 Chronic viral hepatitis C: Secondary | ICD-10-CM

## 2015-09-15 LAB — HEPATITIS C RNA QUANTITATIVE: HCV Quantitative: NOT DETECTED IU/mL (ref <15)

## 2015-09-15 NOTE — Telephone Encounter (Signed)
John Dickerson came in for his labs on 3/28. He forgot about his lab appt the other day. Ribavirin dose was reduced due to anemia. Hbg drop less than 1g this time and still >10. Will cont him on the same dose of ribavirin for now and cont to recheck CBC frequently. His hep C VL is undetectable right. Looks like the current regimen is working right now.

## 2015-09-15 NOTE — Progress Notes (Signed)
John Dickerson came back the other day for a repeated CBC due to his anemia with ribavirin. The drop this time is less than 1g. We are going to bring him back q2wks for cbc to monitor his anemia. He has had great adherence to his regimen.   Sovaldi 400mg  qday Zepatier 1 PO qday Ribavirin 600mg  qday  All for 16 wks

## 2015-09-16 NOTE — Telephone Encounter (Signed)
Thanks

## 2015-09-24 ENCOUNTER — Telehealth: Payer: Self-pay | Admitting: Dietician

## 2015-09-24 ENCOUNTER — Encounter: Payer: Self-pay | Admitting: Internal Medicine

## 2015-09-24 ENCOUNTER — Ambulatory Visit (INDEPENDENT_AMBULATORY_CARE_PROVIDER_SITE_OTHER): Payer: Self-pay | Admitting: Dietician

## 2015-09-24 ENCOUNTER — Ambulatory Visit (INDEPENDENT_AMBULATORY_CARE_PROVIDER_SITE_OTHER): Payer: Self-pay | Admitting: Internal Medicine

## 2015-09-24 ENCOUNTER — Encounter: Payer: Self-pay | Admitting: Dietician

## 2015-09-24 VITALS — BP 127/74 | HR 78 | Temp 97.9°F | Ht 62.0 in | Wt 179.6 lb

## 2015-09-24 DIAGNOSIS — D6489 Other specified anemias: Secondary | ICD-10-CM

## 2015-09-24 DIAGNOSIS — B1710 Acute hepatitis C without hepatic coma: Secondary | ICD-10-CM

## 2015-09-24 DIAGNOSIS — E785 Hyperlipidemia, unspecified: Secondary | ICD-10-CM

## 2015-09-24 DIAGNOSIS — I10 Essential (primary) hypertension: Secondary | ICD-10-CM

## 2015-09-24 DIAGNOSIS — E119 Type 2 diabetes mellitus without complications: Secondary | ICD-10-CM

## 2015-09-24 DIAGNOSIS — E781 Pure hyperglyceridemia: Secondary | ICD-10-CM

## 2015-09-24 DIAGNOSIS — Z713 Dietary counseling and surveillance: Secondary | ICD-10-CM

## 2015-09-24 DIAGNOSIS — Z79899 Other long term (current) drug therapy: Secondary | ICD-10-CM

## 2015-09-24 DIAGNOSIS — Z7984 Long term (current) use of oral hypoglycemic drugs: Secondary | ICD-10-CM

## 2015-09-24 HISTORY — DX: Other specified anemias: D64.89

## 2015-09-24 NOTE — Patient Instructions (Signed)
Fat and Cholesterol Restricted Diet High levels of fat and cholesterol in your blood may lead to various health problems, such as diseases of the heart, blood vessels, gallbladder, liver, and pancreas. Fats are concentrated sources of energy that come in various forms. Certain types of fat, including saturated fat, may be harmful in excess. Cholesterol is a substance needed by your body in small amounts. Your body makes all the cholesterol it needs. Excess cholesterol comes from the food you eat. When you have high levels of cholesterol and saturated fat in your blood, health problems can develop because the excess fat and cholesterol will gather along the walls of your blood vessels, causing them to narrow. Choosing the right foods will help you control your intake of fat and cholesterol. This will help keep the levels of these substances in your blood within normal limits and reduce your risk of disease.  WHAT IS MY PLAN? Your health care provider recommends that you:   Limit your intake of  fat to less than 60 grams each day.   WHAT TYPES OF FAT SHOULD I CHOOSE?  Choose healthy fats more often. Choose monounsaturated and polyunsaturated fats, such as olive and canola oil, flaxseeds, walnuts, almonds, and seeds.  Eat more omega-3 fats. Good choices include salmon, mackerel, sardines, tuna, flaxseed oil, and ground flaxseeds. Aim to eat fish at least two times a week.  Limit saturated fats. Saturated fats are primarily found in animal products, such as meats, butter, and cream. Plant sources of saturated fats include palm oil, palm kernel oil, and coconut oil.  Avoid foods with partially hydrogenated oils in them. These contain trans fats. Examples of foods that contain trans fats are stick margarine, some tub margarines, cookies, crackers, and other baked goods.  WHAT GENERAL GUIDELINES DO I NEED TO FOLLOW? These guidelines for healthy eating will help you control your intake of fat and  cholesterol:  Check food labels carefully to identify foods with trans fats or high amounts of saturated fat.  Fill one half of your plate with vegetables and green salads.  Fill one fourth of your plate with whole grains. Look for the word "whole" as the first word in the ingredient list.  Fill one fourth of your plate with lean protein foods.  Limit fruit to two servings a day. Choose fruit instead of juice.  Eat more foods that contain soluble fiber. Examples of foods that contain this type of fiber are apples, broccoli, carrots, beans, peas, and barley. Aim to get 20-30 g of fiber per day.  Eat more home-cooked food and less restaurant, buffet, and fast food.  Limit or avoid alcohol.  Limit foods high in starch and sugar.  Limit fried foods.  Cook foods using methods other than frying. Baking, boiling, grilling, and broiling are all great options.  Lose weight if you are overweight. Losing just 5-10% of your initial body weight can help your overall health and prevent diseases such as diabetes and heart disease.  WHAT FOODS CAN I EAT? Grains Whole grains, such as whole wheat or whole grain breads, crackers, cereals, and pasta. Unsweetened oatmeal, bulgur, barley, quinoa, or brown rice. Corn or whole wheat flour tortillas. Vegetables Fresh or frozen vegetables (raw, steamed, roasted, or grilled). Green salads. Fruits All fresh, canned (in natural juice), or frozen fruits. Meat and Other Protein Products Ground beef (85% or leaner), grass-fed beef, or beef trimmed of fat. Skinless chicken or [place]. Ground chicken or [place]. Pork trimmed of fat. All fish  and seafood. Eggs. Dried beans, peas, or lentils. Unsalted nuts or seeds. Unsalted canned or dry beans. Dairy Low-fat dairy products, such as skim or 1% milk, 2% or reduced-fat cheeses, low-fat ricotta or cottage cheese, or plain low-fat yogurt. Fats and Oils Tub margarines without trans fats. Light or reduced-fat mayonnaise  and salad dressings. Avocado. Olive, canola, sesame, or safflower oils. Natural peanut or almond butter (choose ones without added sugar and oil). The items listed above may not be a complete list of recommended foods or beverages. Contact your dietitian for more options.  WHAT FOODS ARE NOT RECOMMENDED? Grains White bread. Biscuits, White pasta. White rice. Cornbread. Bagels, pastries, and croissants. Crackers that contain trans fat. Vegetables White potatoes. Corn. Creamed or fried vegetables. Vegetables in a cheese sauce. Fruits Dried fruits. Canned fruit in light or heavy syrup. Fruit juice. Meat and Other Protein Products Fatty cuts of meat. Ribs, chicken wings, bacon, sausage, bologna, salami, chitterlings, fatback, hot dogs, bratwurst, and packaged luncheon meats. Liver and organ meats. Dairy Whole or 2% milk, cream, half-and-half, and cream cheese. Whole milk cheeses. Whole-fat or sweetened yogurt. Full-fat cheeses. Nondairy creamers and whipped toppings. Processed cheese, cheese spreads, or cheese curds. Sweets and Desserts Corn syrup, sugars, honey, and molasses. Candy. Jam and jelly. Syrup. Sweetened cereals. Cookies, pies, cakes, donuts, muffins, and ice cream. Fats and Oils Butter, stick margarine, lard, shortening, ghee, or bacon fat. Coconut, palm kernel, or palm oils. Beverages Alcohol. Sweetened drinks (such as sodas, lemonade, and fruit drinks or punches). The items listed above may not be a complete list of foods and beverages to avoid. Contact your dietitian for more information.

## 2015-09-24 NOTE — Assessment & Plan Note (Signed)
Lab Results  Component Value Date   HGBA1C 8.4 08/03/2015   HGBA1C 13.4* 04/26/2015     Assessment: Diabetes control: Improving Progress toward A1C goal:  Pending Comments: He is making great progress compared to last year with lifestyle modification and glipizide. He has not started log today so there isn't much data to change plans with. Needs him to check it and come back in a month.  Plan: Medications:  continue current medications Home glucose monitoring: Frequency: no home glucose monitoring (has not been assessing ) Timing:   Instruction/counseling given: reminded to get eye exam, reminded to bring blood glucose meter & log to each visit and discussed diet Educational resources provided: Appointment with diabetes educator following this encounter. Self management tools provided: copy of home glucose meter download, instructions for home glucose monitoring, home glucose logbook Other plans: We can check HgbA1c and review his glucometer with other labs at follow up in May.

## 2015-09-24 NOTE — Progress Notes (Signed)
Subjective:   Patient ID: John Dickerson male   DOB: 1961-06-09 55 y.o.   MRN: [id number]  HPI: [name] is a 55 y.o. with PMHx detailed below presenting for follow up of his hypertension and diabetes. He is taking his medications daily and is making efforts to walk more and eat better. He obtained a glucometer but has not started using this to check his blood sugar. He has noticed decreased thirst and urinary frequency. He also reports having a bad taste in his mouth less often. He is following up with Infectious Disease clinic and started treatment for his Hepatitis C since 2/25. He has some anemia due to treatment there but denies symptoms of fatigue, weakness, lightheadedness, SOB.  See problem based assessment and plan below for additional details.  Past Medical History  Diagnosis Date  . HIV (human immunodeficiency virus infection) ([place])   . Hepatitis C   . Hypertension   . Diabetes mellitus without complication [facility])    Current Outpatient Prescriptions  Medication Sig Dispense Refill  . atenolol (TENORMIN) 100 MG tablet TAKE ONE TABLET BY MOUTH ONCE DAILY 30 tablet 5  . blood glucose meter kit and supplies KIT Dispense based on patient and insurance preference. Use up to four times daily as directed. (FOR ICD-9 250.00, 250.01). 1 each 0  . Elbasvir-Grazoprevir (ZEPATIER) 50-100 MG TABS Take 1 tablet by mouth daily. 28 tablet 3  . emtricitabine-tenofovir AF (DESCOVY) 200-25 MG tablet Take 1 tablet by mouth daily. 30 tablet 11  . gemfibrozil (LOPID) 600 MG tablet Take 1 tablet (600 mg total) by mouth 2 (two) times daily. 30 minutes before breakfast and dinner. 60 tablet 2  . glipiZIDE (GLUCOTROL) 5 MG tablet Take 1 tablet (5 mg total) by mouth daily before breakfast. 30 tablet 2  . lisinopril-hydrochlorothiazide (PRINZIDE,ZESTORETIC) 20-12.5 MG tablet Take 2 tablets by mouth daily. 60 tablet 2  . metFORMIN (GLUCOPHAGE) 1000 MG tablet TAKE 1 TABLET BY MOUTH TWICE DAILY  WITH BREAKFAST AND DINNER 60 tablet 2  . raltegravir (ISENTRESS) 400 MG tablet Take 1 tablet (400 mg total) by mouth 2 (two) times daily. 60 tablet 11  . ribavirin (REBETOL) 200 MG capsule Take 3 capsules (600 mg total) by mouth 2 (two) times daily with a meal. 168 capsule 3  . Sofosbuvir (SOVALDI) 400 MG TABS Take 1 tablet by mouth daily. 28 tablet 3   No current facility-administered medications for this visit.   Family History  Problem Relation Age of Onset  . Diabetes Mother   . Cancer Mother     unkown type  . Hypertension Father    Social History   Social History  . Marital Status: Single    Spouse Name: N/A  . Number of Children: N/A  . Years of Education: N/A   Social History Main Topics  . Smoking status: Former Smoker -- 0.30 packs/day for 30 years    Types: Cigarettes    Start date: 07/20/2012    Quit date: 02/17/2014  . Smokeless tobacco: Never Used  . Alcohol Use: No     Comment: occasional  . Drug Use: No  . Sexual Activity:    Partners: Female     Comment: pt. given condoms   Other Topics Concern  . None   Social History Narrative   Review of Systems: Review of Systems  Respiratory: Negative for shortness of breath.   Cardiovascular: Negative for chest pain and leg swelling.  Gastrointestinal: Negative for diarrhea.  Genitourinary: Negative for  frequency.  Musculoskeletal: Negative for falls.  Neurological: Negative for dizziness, weakness and headaches.  Endo/Heme/Allergies: Negative for polydipsia.    Objective:  Physical Exam: Filed Vitals:   09/24/15 0934 09/24/15 0936  BP:  127/74  Pulse:  78  Temp: 97.9 F (36.6 C)   TempSrc: Oral   Height: _0  (1.575 m)   Weight: 179 lb 9.6 oz (81.466 kg)   SpO2:  100%   GENERAL- alert, co-operative, NAD HEENT- oral mucosa appears moist, good and intact dentition CARDIAC- RRR, no murmurs, rubs or gallops. RESP- CTAB, no wheezes or crackles. ABDOMEN- Soft, nontende EXTREMITIES- symmetric, no  pedal edema. SKIN- Warm, dry, No rash or lesion. PSYCH- Normal mood and affect, appropriate thought content and speech.  Assessment & Plan:

## 2015-09-24 NOTE — Assessment & Plan Note (Deleted)
He has not actually taken the gemfibrozil prior to his previous lipid studies. I recommended a morning appointment for fasting lab draws if possible and while taking this medication as triglycerides are subject to large variation 2/2 diet. The rest of his lipid profile is not bad.  Continue diet counseling, fenofibrate Check lipids at next appt if fasting

## 2015-09-24 NOTE — Assessment & Plan Note (Signed)
Secondary to treatment by ID. Anemia is relatively mild and he is asymptomatic at this time so no new intervention is needed. Continue to follow and assess for symptomatic anemia during his treatment course.

## 2015-09-24 NOTE — Assessment & Plan Note (Addendum)
BP Readings from Last 3 Encounters:  09/24/15 127/74  08/03/15 109/75  07/06/15 154/89    Lab Results  Component Value Date   NA 133* 05/11/2015   K 4.5 05/11/2015   CREATININE 0.98 05/11/2015    Assessment: Blood pressure control: controlled Progress toward BP goal:  at goal Comments: He is at great control on his current atenolol 100mg , lisinopril 40mg  and HCTZ 25mg . He denies episodes of headache and dizziness.  Plan: Medications:  continue current medications Other plans: Refilled medications at current doses. Will check labs at next visit in May

## 2015-09-24 NOTE — Patient Instructions (Signed)
You are doing an excellent job with your diabetes by exercising regularly and taking medications every day. At your next appointment we will check your Hemoglobin A1c again and also look at the information from your glucose meter. It is very important to check your sugars and bring this meter to the next appointment so we can make an ideal plan for your medications.  Your blood pressure is well controlled today and we do not need to make any changes today.  Return in May and we will test your blood count and metabolic panel at that time.

## 2015-09-24 NOTE — Assessment & Plan Note (Signed)
He states he is taking gemfibrozil since his previous visit. He ate a steak biscuit prior to arriving this morning. I recommend a morning appointment for fasting lab draws if possible and while taking this medication as triglycerides are subject to large variation 2/2 diet. The rest of his lipid profile is not quite as bad.  Continue diet counseling, fenofibrate Check lipids at next appt if fasting

## 2015-09-24 NOTE — Telephone Encounter (Signed)
CDE called patient to follow up on diet for high triglycerides. Asked what he knows about diet for high triglycerides and danger of having them be so high. He said that is why he wants to cut back on red meat- ribs, steaks, etc. Briefly discussed general low fat diet. Mailed him written information and asked him to see me on Monday after he sees the financial counselor to be sure he understands it

## 2015-09-24 NOTE — Progress Notes (Addendum)
Diabetes Self-Management Education  Visit Type:  Follow-up  Appt. Start Time: 1020. End Time: 1055  09/24/2015  [name], identified by name and date of birth, is a 55 y.o. male with a diagnosis of Diabetes:  .   ASSESSMENT A1C- 8.4 down form > 13 Weight  Increased to 179.6# with  BMI.>30 Triglycerides >1000- he denies alcohol, sweets drinks, does eat read meat often       Diabetes Self-Management Education - 09/24/15 1300    Health Coping   How would you rate your overall health? Good   Dietary Intake   Breakfast steak biscuit and coffee   Beverage(s) diet drinks and water   Exercise   Exercise Type Light (walking / raking leaves)   How many days per week to you exercise? 7   How many minutes per day do you exercise? 30   Total minutes per week of exercise 210   Patient Education   Monitoring Taught/evaluated SMBG meter.;Purpose and frequency of SMBG.;Taught/discussed recording of test results and interpretation of SMBG.;Identified appropriate SMBG and/or A1C goals.;Daily foot exams   Chronic complications Assessed and discussed foot care and prevention of foot problems   Individualized Goals (developed by patient)   Nutrition Eat less meat   Monitoring  test my blood glucose as discussed   Patient Self-Evaluation of Goals - Patient rates self as meeting previously set goals (% of time)   Nutrition >75%   Outcomes   Program Status Not Completed   Subsequent Visit   Since your last visit have you continued or begun to take your medications as prescribed? Yes   Since your last visit have you had your blood pressure checked? Yes   Is your most recent blood pressure lower, unchanged, or higher since your last visit? Unchanged   Since your last visit have you experienced any weight changes? No change   Since your last visit, are you checking your blood glucose at least once a day? No      Learning Objective:  Patient will have a greater understanding of diabetes  self-management. Patient education plan : what is diabetes, nutrition, medications, monitoring, exercise, how to handle high and low blood sugars, special care for my body when I have diabetes  My plan to support myself in continuing these changes to care for my diabetes is to attend or contact:   Doctors appointments and diabetes self management training. He agrees to call anytime he has questions    Plan:   Patient Instructions  Fat and Cholesterol Restricted Diet High levels of fat and cholesterol in your blood may lead to various health problems, such as diseases of the heart, blood vessels, gallbladder, liver, and pancreas. Fats are concentrated sources of energy that come in various forms. Certain types of fat, including saturated fat, may be harmful in excess. Cholesterol is a substance needed by your body in small amounts. Your body makes all the cholesterol it needs. Excess cholesterol comes from the food you eat. When you have high levels of cholesterol and saturated fat in your blood, health problems can develop because the excess fat and cholesterol will gather along the walls of your blood vessels, causing them to narrow. Choosing the right foods will help you control your intake of fat and cholesterol. This will help keep the levels of these substances in your blood within normal limits and reduce your risk of disease.  WHAT IS MY PLAN? Your health care provider recommends that you:   Limit  your intake of  fat to less than 60 grams each day.   WHAT TYPES OF FAT SHOULD I CHOOSE?  Choose healthy fats more often. Choose monounsaturated and polyunsaturated fats, such as olive and canola oil, flaxseeds, walnuts, almonds, and seeds.  Eat more omega-3 fats. Good choices include salmon, mackerel, sardines, tuna, flaxseed oil, and ground flaxseeds. Aim to eat fish at least two times a week.  Limit saturated fats. Saturated fats are primarily found in animal products, such as meats,  butter, and cream. Plant sources of saturated fats include palm oil, palm kernel oil, and coconut oil.  Avoid foods with partially hydrogenated oils in them. These contain trans fats. Examples of foods that contain trans fats are stick margarine, some tub margarines, cookies, crackers, and other baked goods.  WHAT GENERAL GUIDELINES DO I NEED TO FOLLOW? These guidelines for healthy eating will help you control your intake of fat and cholesterol:  Check food labels carefully to identify foods with trans fats or high amounts of saturated fat.  Fill one half of your plate with vegetables and green salads.  Fill one fourth of your plate with whole grains. Look for the word "whole" as the first word in the ingredient list.  Fill one fourth of your plate with lean protein foods.  Limit fruit to two servings a day. Choose fruit instead of juice.  Eat more foods that contain soluble fiber. Examples of foods that contain this type of fiber are apples, broccoli, carrots, beans, peas, and barley. Aim to get 20-30 g of fiber per day.  Eat more home-cooked food and less restaurant, buffet, and fast food.  Limit or avoid alcohol.  Limit foods high in starch and sugar.  Limit fried foods.  Cook foods using methods other than frying. Baking, boiling, grilling, and broiling are all great options.  Lose weight if you are overweight. Losing just 5-10% of your initial body weight can help your overall health and prevent diseases such as diabetes and heart disease.  WHAT FOODS CAN I EAT? Grains Whole grains, such as whole wheat or whole grain breads, crackers, cereals, and pasta. Unsweetened oatmeal, bulgur, barley, quinoa, or brown rice. Corn or whole wheat flour tortillas. Vegetables Fresh or frozen vegetables (raw, steamed, roasted, or grilled). Green salads. Fruits All fresh, canned (in natural juice), or frozen fruits. Meat and Other Protein Products Ground beef (85% or leaner), grass-fed  beef, or beef trimmed of fat. Skinless chicken or [place]. Ground chicken or [place]. Pork trimmed of fat. All fish and seafood. Eggs. Dried beans, peas, or lentils. Unsalted nuts or seeds. Unsalted canned or dry beans. Dairy Low-fat dairy products, such as skim or 1% milk, 2% or reduced-fat cheeses, low-fat ricotta or cottage cheese, or plain low-fat yogurt. Fats and Oils Tub margarines without trans fats. Light or reduced-fat mayonnaise and salad dressings. Avocado. Olive, canola, sesame, or safflower oils. Natural peanut or almond butter (choose ones without added sugar and oil). The items listed above may not be a complete list of recommended foods or beverages. Contact your dietitian for more options.  WHAT FOODS ARE NOT RECOMMENDED? Grains White bread. Biscuits, White pasta. White rice. Cornbread. Bagels, pastries, and croissants. Crackers that contain trans fat. Vegetables White potatoes. Corn. Creamed or fried vegetables. Vegetables in a cheese sauce. Fruits Dried fruits. Canned fruit in light or heavy syrup. Fruit juice. Meat and Other Protein Products Fatty cuts of meat. Ribs, chicken wings, bacon, sausage, bologna, salami, chitterlings, fatback, hot dogs, bratwurst, and packaged  luncheon meats. Liver and organ meats. Dairy Whole or 2% milk, cream, half-and-half, and cream cheese. Whole milk cheeses. Whole-fat or sweetened yogurt. Full-fat cheeses. Nondairy creamers and whipped toppings. Processed cheese, cheese spreads, or cheese curds. Sweets and Desserts Corn syrup, sugars, honey, and molasses. Candy. Jam and jelly. Syrup. Sweetened cereals. Cookies, pies, cakes, donuts, muffins, and ice cream. Fats and Oils Butter, stick margarine, lard, shortening, ghee, or bacon fat. Coconut, palm kernel, or palm oils. Beverages Alcohol. Sweetened drinks (such as sodas, lemonade, and fruit drinks or punches). The items listed above may not be a complete list of foods and beverages to avoid.  Contact your dietitian for more information.      Expected Outcomes:  Demonstrated interest in learning. Expect positive outcomes Education material provided: My Plate If problems or questions, patient to contact team via:  Phone Future DSME appointment: - 4-6 wks

## 2015-09-27 ENCOUNTER — Ambulatory Visit: Payer: Self-pay | Admitting: Dietician

## 2015-09-27 ENCOUNTER — Ambulatory Visit: Payer: Self-pay

## 2015-09-27 MED FILL — RIBAVIRIN 200 MG TABLET: 200 | 28 days supply | Qty: 168 | Fill #2

## 2015-09-28 ENCOUNTER — Other Ambulatory Visit (INDEPENDENT_AMBULATORY_CARE_PROVIDER_SITE_OTHER): Payer: Self-pay

## 2015-09-28 DIAGNOSIS — B182 Chronic viral hepatitis C: Secondary | ICD-10-CM

## 2015-09-28 DIAGNOSIS — B20 Human immunodeficiency virus [HIV] disease: Secondary | ICD-10-CM

## 2015-09-28 DIAGNOSIS — E119 Type 2 diabetes mellitus without complications: Secondary | ICD-10-CM

## 2015-09-28 DIAGNOSIS — Z79899 Other long term (current) drug therapy: Secondary | ICD-10-CM

## 2015-09-28 DIAGNOSIS — Z113 Encounter for screening for infections with a predominantly sexual mode of transmission: Secondary | ICD-10-CM

## 2015-09-28 LAB — CBC
HEMATOCRIT: 28.7 % — AB (ref 38.5–50.0)
HEMOGLOBIN: 9.4 g/dL — AB (ref 13.2–17.1)
MCH: 30.4 pg (ref 27.0–33.0)
MCHC: 32.8 g/dL (ref 32.0–36.0)
MCV: 92.9 fL (ref 80.0–100.0)
MPV: 8.5 fL (ref 7.5–12.5)
Platelets: 501 10*3/uL — ABNORMAL HIGH (ref 140–400)
RBC: 3.09 MIL/uL — ABNORMAL LOW (ref 4.20–5.80)
RDW: 15 % (ref 11.0–15.0)
WBC: 7 10*3/uL (ref 3.8–10.8)

## 2015-10-07 ENCOUNTER — Ambulatory Visit (INDEPENDENT_AMBULATORY_CARE_PROVIDER_SITE_OTHER): Payer: Self-pay | Admitting: Pharmacist Clinician (PhC)/ Clinical Pharmacy Specialist

## 2015-10-07 ENCOUNTER — Other Ambulatory Visit: Payer: Self-pay

## 2015-10-07 DIAGNOSIS — B20 Human immunodeficiency virus [HIV] disease: Secondary | ICD-10-CM

## 2015-10-07 DIAGNOSIS — B182 Chronic viral hepatitis C: Secondary | ICD-10-CM

## 2015-10-07 LAB — CBC
HEMATOCRIT: 30.2 % — AB (ref 38.5–50.0)
HEMOGLOBIN: 10.1 g/dL — AB (ref 13.2–17.1)
MCH: 31.1 pg (ref 27.0–33.0)
MCHC: 33.4 g/dL (ref 32.0–36.0)
MCV: 92.9 fL (ref 80.0–100.0)
MPV: 8.5 fL (ref 7.5–12.5)
Platelets: 500 10*3/uL — ABNORMAL HIGH (ref 140–400)
RBC: 3.25 MIL/uL — AB (ref 4.20–5.80)
RDW: 16.2 % — ABNORMAL HIGH (ref 11.0–15.0)
WBC: 6.2 10*3/uL (ref 3.8–10.8)

## 2015-10-07 NOTE — Progress Notes (Addendum)
Patient ID: John Dickerson, male   DOB: [birth year], 55 y.o.   MRN: [id number]  HPI: John Dickerson is a 55 y.o. male with hep C here for a follow up pharmacy appointment.   Allergies: No Known Allergies  Vitals:    Past Medical History: Past Medical History  Diagnosis Date  . HIV (human immunodeficiency virus infection) ([place])   . Hepatitis C   . Hypertension   . Diabetes mellitus without complication [facility])     diagnosed 04-2015    Social History: Social History   Social History  . Marital Status: Single    Spouse Name: N/A  . Number of Children: N/A  . Years of Education: N/A   Social History Main Topics  . Smoking status: Former Smoker -- 0.30 packs/day for 30 years    Types: Cigarettes    Start date: 07/20/2012    Quit date: 02/17/2014  . Smokeless tobacco: Never Used  . Alcohol Use: No     Comment: occasional  . Drug Use: No  . Sexual Activity:    Partners: Female     Comment: pt. given condoms   Other Topics Concern  . Not on file   Social History Narrative    Labs: HIV 1 RNA QUANT (copies/mL)  Date Value  09/02/2015 61*  04/23/2015 <20  03/11/2015 <20   CD4 T CELL ABS (/uL)  Date Value  04/22/2015 540  03/11/2015 540  08/10/2014 430   HEP B S AB (no units)  Date Value  08/13/2006 YES   HEPATITIS B SURFACE AG (no units)  Date Value  08/13/2006 No   HCV AB (no units)  Date Value  08/13/2006 YES    CrCl: CrCl cannot be calculated (Patient has no serum creatinine result on file.).  Lipids:    Component Value Date/Time   CHOL 175 05/11/2015 1044   CHOL 250* 04/23/2015 1013   TRIG 1038* 05/11/2015 1044   HDL 24* 05/11/2015 1044   HDL 24* 04/23/2015 1013   CHOLHDL 7.3* 05/11/2015 1044   CHOLHDL 10.4* 04/23/2015 1013   VLDL NOT CALC 04/23/2015 1013   LDLCALC Comment 05/11/2015 1044   LDLCALC NOT CALC 04/23/2015 1013    Assessment: Mr. Cogan has hepatitis C with previously failed treatment. He started retreatment on  2/25 with Zepatier/Sovaldi/Ribavirin to continue for 16 weeks total. He has been compliant with his regimen and does not complain of any issues. His ribavirin dose was previously decreased to 600 mg daily due to a decreased hemoglobin. Last labs checked on 4/11 showed further decrease in Hgb to 9.4 from 10.2. His hep C VL was found to be undetectable. He will get his CBC checked again today.    Recommendations: Continue Zepatier/Sovaldi/Ribavirin F/u CBC results Return in 2 weeks for pharmacy appointment to review CBC results and adjust ribavirin if needed  Joya San, PharmD Clinical Pharmacy Resident Pager # [phone] 10/07/2015 9:14 AM  Onnie Boer, PharmD Pager: [phone] 10/07/2015 10:04 AM

## 2015-10-07 NOTE — Patient Instructions (Signed)
Cont Zepatier 1 tablet daily Cont Sovaldi 400mg  daily Cont Ribavirin 600mg  daily

## 2015-10-13 NOTE — Addendum Note (Signed)
Addended by: Gilles Chiquito B on: 10/13/2015 03:42 PM   Modules accepted: Level of Service

## 2015-10-13 NOTE — Progress Notes (Signed)
Internal Medicine Clinic Attending  Case discussed with Dr. Rice at the time of the visit.  We reviewed the resident's history and exam and pertinent patient test results.  I agree with the assessment, diagnosis, and plan of care documented in the resident's note.  

## 2015-10-22 ENCOUNTER — Other Ambulatory Visit (INDEPENDENT_AMBULATORY_CARE_PROVIDER_SITE_OTHER): Payer: Medicaid Other

## 2015-10-22 ENCOUNTER — Ambulatory Visit: Payer: Medicaid Other

## 2015-10-22 DIAGNOSIS — B182 Chronic viral hepatitis C: Secondary | ICD-10-CM

## 2015-10-22 DIAGNOSIS — B1710 Acute hepatitis C without hepatic coma: Secondary | ICD-10-CM

## 2015-10-22 DIAGNOSIS — B20 Human immunodeficiency virus [HIV] disease: Secondary | ICD-10-CM

## 2015-10-22 LAB — CBC
HCT: 30.4 % — ABNORMAL LOW (ref 38.5–50.0)
Hemoglobin: 9.9 g/dL — ABNORMAL LOW (ref 13.2–17.1)
MCH: 31 pg (ref 27.0–33.0)
MCHC: 32.6 g/dL (ref 32.0–36.0)
MCV: 95.3 fL (ref 80.0–100.0)
MPV: 8.7 fL (ref 7.5–12.5)
PLATELETS: 379 10*3/uL (ref 140–400)
RBC: 3.19 MIL/uL — AB (ref 4.20–5.80)
RDW: 15.6 % — AB (ref 11.0–15.0)
WBC: 5.8 10*3/uL (ref 3.8–10.8)

## 2015-10-25 ENCOUNTER — Other Ambulatory Visit: Payer: Self-pay | Admitting: Internal Medicine

## 2015-10-25 ENCOUNTER — Telehealth: Payer: Self-pay | Admitting: Pharmacist Clinician (PhC)/ Clinical Pharmacy Specialist

## 2015-10-25 DIAGNOSIS — B182 Chronic viral hepatitis C: Secondary | ICD-10-CM

## 2015-10-25 LAB — HEPATITIS C RNA QUANTITATIVE: HCV Quantitative: NOT DETECTED IU/mL (ref <15)

## 2015-10-25 MED FILL — RIBAVIRIN 200 MG TABLET: 200 | 28 days supply | Qty: 168 | Fill #3

## 2015-10-25 NOTE — Telephone Encounter (Signed)
John Dickerson came into to the clinic to get his CBC last Friday. Hgb remains stable around 10. We are going to try to titrate his ribavirin to 1000mg  and prob keep it there. He has an appt with Dr. Johnnye Sima at the end of May so we can do his repeat CBC then.

## 2015-10-29 ENCOUNTER — Ambulatory Visit (INDEPENDENT_AMBULATORY_CARE_PROVIDER_SITE_OTHER): Payer: Medicare Other | Admitting: Internal Medicine

## 2015-10-29 ENCOUNTER — Ambulatory Visit (HOSPITAL_COMMUNITY)
Admission: RE | Admit: 2015-10-29 | Discharge: 2015-10-29 | Disposition: A | Discharge status: Home / Self Care | Payer: Medicare Other | Source: Ambulatory Visit | Attending: Internal Medicine | Admitting: Internal Medicine

## 2015-10-29 ENCOUNTER — Encounter: Payer: Self-pay | Admitting: Internal Medicine

## 2015-10-29 VITALS — BP 137/75 | HR 88 | Temp 98.6°F | Ht 62.0 in | Wt 181.1 lb

## 2015-10-29 DIAGNOSIS — M79671 Pain in right foot: Secondary | ICD-10-CM | POA: Diagnosis not present

## 2015-10-29 DIAGNOSIS — E781 Pure hyperglyceridemia: Secondary | ICD-10-CM | POA: Diagnosis not present

## 2015-10-29 DIAGNOSIS — Z7984 Long term (current) use of oral hypoglycemic drugs: Secondary | ICD-10-CM | POA: Diagnosis not present

## 2015-10-29 DIAGNOSIS — M2011 Hallux valgus (acquired), right foot: Secondary | ICD-10-CM | POA: Diagnosis not present

## 2015-10-29 DIAGNOSIS — X58XXXD Exposure to other specified factors, subsequent encounter: Secondary | ICD-10-CM | POA: Diagnosis not present

## 2015-10-29 DIAGNOSIS — E119 Type 2 diabetes mellitus without complications: Secondary | ICD-10-CM | POA: Diagnosis not present

## 2015-10-29 DIAGNOSIS — M109 Gout, unspecified: Secondary | ICD-10-CM | POA: Diagnosis not present

## 2015-10-29 DIAGNOSIS — S92334D Nondisplaced fracture of third metatarsal bone, right foot, subsequent encounter for fracture with routine healing: Secondary | ICD-10-CM | POA: Insufficient documentation

## 2015-10-29 LAB — GLUCOSE, CAPILLARY: Glucose-Capillary: 203 mg/dL — ABNORMAL HIGH (ref 65–99)

## 2015-10-29 LAB — POCT GLYCOSYLATED HEMOGLOBIN (HGB A1C): Hemoglobin A1C: 4.8

## 2015-10-29 NOTE — Progress Notes (Signed)
   Subjective:    Patient ID: John Dickerson, male    DOB: 07/02/1960, 55 y.o.   MRN: [id number]  HPI  Mr. Retana is a 55 year old man with a PMH of HIV (CD4 540, VL 64), HCV (VL undetectable on Sovaldi, Zepatier, Ribavarin), HTN, T2DM who comes to the clinic to discuss right foot pain, T2DM, and high triglycerides noted 04/2015. With respect to his right foot pain, it started approximately 1.5 weeks ago. He feels the pain is localized at the joint of his big toe that his worsened when walking. He has never felt this pain before. He describes having to keep his right foot outside of the sheets to prevent the pain. He describes some swelling. He denies any trauma to the area. He denies any fever or chills. He's had some weight gain but denies drinking any beer. He says he's been walking with a limp.  With respect to his T2DM, He has been taking metformin 1000 mg 24 hr tablet daily + glipizide 5 mg. He has been taking his home BG readings and has had tight control, with fasting BGs in the 90-100 range and post-prandials in the 150s. He denies any episodes of symptomatic hypoglycemia. He denies polyuria and polydipsia.  Patient has been adherent with gemfibrozil. He says he's done a better job with a low carb, low fat diet, but he did eat a biscuit this morning.   Review of Systems  Constitutional: Negative for fever and chills.  Respiratory: Negative for cough and shortness of breath.   Cardiovascular: Negative for chest pain and palpitations.  Gastrointestinal: Negative for abdominal pain and diarrhea.  Endocrine: Negative for polydipsia, polyphagia and polyuria.  Musculoskeletal: Positive for arthralgias and gait problem. Negative for back pain.  Neurological: Negative for dizziness and headaches.       Objective:   Physical Exam  Constitutional: He appears well-developed. No distress.  Cardiovascular: Normal rate, regular rhythm and normal heart sounds.   Pulmonary/Chest: Effort  normal and breath sounds normal. No respiratory distress. He has no wheezes.  Abdominal: Soft. Bowel sounds are normal. He exhibits no distension. There is no tenderness.  Musculoskeletal:  TTP of 1st MTP joint of right foot. Antalgic gait. Limited ROM of MTP joint. Mild TTP of other MTP joints. No significant swelling.   Neurological: He is alert. He has normal reflexes.  Psychiatric: He has a normal mood and affect. His behavior is normal.  Vitals reviewed.      Assessment & Plan:   Please see problem based assessment and plan for details.

## 2015-10-29 NOTE — Assessment & Plan Note (Signed)
A: Triglycerides >1000 starting 04/2015 after multiple normal readings previously. At that time, his A1c was also 13. It is noted that gemfibrozil is known to interact with Zepatier. We will d/c his gemfibrozil today and recheck fasting triglycerides in two weeks to see if fibrate therapy is actually needed. If so, fenofibrate would be a good option since it does not robustly interact with zepatier.  P: Fasting TGs in two weeks off therapy

## 2015-10-29 NOTE — Assessment & Plan Note (Signed)
A: Patient has tight well-controlled BGs on current regimen with BGs in the 90-150 range. Hemglobin A1c 4.8, likely an underestimation due to increased cell turnover related to his medication induced anemia. Nonetheless, a significant improvement from A1c of 13.4 6 months ago. He is tolerating his current therapy well.  P: Continue Metforming 1000 mg BID

## 2015-10-29 NOTE — Assessment & Plan Note (Signed)
A: Patient had uncontrolled T2DM 6 months ago. He has recent weight gain with HCTZ as one of his medications. Differential includes Charcot foot versus gout. Exam is not fully c/w podagra given that he has pain that extends beyond the MTP joint and there is no significant swelling. While official read is pending, I do not see evidence of erosions on foot X-ray that are classic for Charcot foot.  P: Ibuprofen 800 mg TID until symptom resolution Foot-xray interpretation pending

## 2015-10-29 NOTE — Patient Instructions (Signed)
Mr. Amith, [place] to see you today.   For your right foot pain, we are getting X-rays today. If you don't hear from [place], it's a good thing. Please take 800 mg (4 200 mg pills)Ibuprofen three times a day until your symptoms resolve. If nothing is on the X-ray, it is most likely gout.   Your Diabetes and Blood pressure look GREAT.  Please stop taking Gemfibrozil. Next week, we will have an appointment in the morning. Please fast that day.   We will see you next week.

## 2015-11-01 ENCOUNTER — Other Ambulatory Visit: Payer: Medicare Other

## 2015-11-01 NOTE — Progress Notes (Signed)
Internal Medicine Clinic Attending  Case discussed with Dr. Ford at the time of the visit.  We reviewed the resident's history and exam and pertinent patient test results.  I agree with the assessment, diagnosis, and plan of care documented in the resident's note.  

## 2015-11-03 ENCOUNTER — Encounter: Payer: Self-pay | Admitting: Internal Medicine

## 2015-11-03 ENCOUNTER — Ambulatory Visit (INDEPENDENT_AMBULATORY_CARE_PROVIDER_SITE_OTHER): Payer: Medicare Other | Admitting: Internal Medicine

## 2015-11-03 VITALS — BP 128/66 | HR 82 | Temp 98.4°F | Ht 62.0 in | Wt 183.4 lb

## 2015-11-03 DIAGNOSIS — E781 Pure hyperglyceridemia: Secondary | ICD-10-CM | POA: Diagnosis not present

## 2015-11-03 DIAGNOSIS — M79671 Pain in right foot: Secondary | ICD-10-CM | POA: Diagnosis present

## 2015-11-03 LAB — GLUCOSE, CAPILLARY: Glucose-Capillary: 107 mg/dL — ABNORMAL HIGH (ref 65–99)

## 2015-11-03 MED ORDER — DICLOFENAC SODIUM 1 % TD GEL: 4.0000 g | Freq: Four times a day (QID) | TRANSDERMAL | Status: DC

## 2015-11-03 NOTE — Assessment & Plan Note (Signed)
A: Likely due to gout and/or hairline metatarsal fracture. He is healing quickly. Able to walk without limping today. We will transition from oral to topical NSAIDs  P: Voltaren gel

## 2015-11-03 NOTE — Patient Instructions (Signed)
Mr. Indra, [place] to see you again.  You had some gout and a very small fracture on your right foot. I would start slow down on the Ibuprofen and pick up the voltaren gel instead. You appear to be improving significantly.  We're checking you cholesterol today.  Have a great day.

## 2015-11-03 NOTE — Progress Notes (Signed)
Medicine attending: Medical history, presenting problems, physical findings, and medications, reviewed with resident physician Dr  Jeremy Ford on the day of the patient visit and I concur with his evaluation and management plan. 

## 2015-11-03 NOTE — Assessment & Plan Note (Signed)
A: Gemfibrozil was discontinued given interaction with Zepatier. It is unclear of any treatment is needed given elevated triglycerides were only elevated in the context of uncontrolled diabetes.  P: Fasting triglycerides today

## 2015-11-03 NOTE — Progress Notes (Signed)
   Subjective:    Patient ID: John Dickerson, male    DOB: 02/27/1961, 55 y.o.   MRN: [id number]  HPI  John Dickerson is a 55 year old man with a PMH of HIV (CD4 540, VL 51), HCV (VL undetectable on Sovaldi, Zepatier, Ribavarin), HTN, T2DM who comes to the clinic for follow up on right foot pain. He was seen last week for right foot pain that had been going on for the past 1.5 weeks. The pain was located in the 1st MTP joint but without significant swelling. There was also pain extending to the middle of the foot. A right foot x-ray showed gouty changes in the foot with a non-displaced healing metatarsal fracture. Today, he says his foot pain is much improved and he is no longer limping. He is taking ibuprofen 800 mg TID to good effect. He has no other complaints today other than residual foot pain. He is fasting today and has not been taking gemfibrozil, as he was instructed to do.  Review of Systems  Gastrointestinal: Negative for nausea, abdominal pain and blood in stool.  Genitourinary: Negative for hematuria and decreased urine volume.  Musculoskeletal: Negative for back pain and arthralgias.  Neurological: Negative for light-headedness and headaches.       Objective:   Physical Exam  Constitutional: He appears well-developed. No distress.  Cardiovascular: Normal rate, regular rhythm and normal heart sounds.  Pulmonary/Chest: Effort normal and breath sounds normal. No respiratory distress. He has no wheezes.  Abdominal: Soft. Bowel sounds are normal. He exhibits no distension. There is no tenderness.  Musculoskeletal: Mild TTP in MTP joints and metatarsals. Normal gait  Neurological: He is alert. He has normal reflexes.  Psychiatric: He has a normal mood and affect. His behavior is normal.       Assessment & Plan:   Please see problem based assessment and plan for details.

## 2015-11-04 LAB — LIPID PANEL
Chol/HDL Ratio: 4.1 ratio units (ref 0.0–5.0)
Cholesterol, Total: 158 mg/dL (ref 100–199)
HDL: 39 mg/dL — ABNORMAL LOW (ref >39)
LDL Calculated: 99 mg/dL (ref 0–99)
TRIGLYCERIDES: 99 mg/dL (ref 0–149)
VLDL Cholesterol Cal: 20 mg/dL (ref 5–40)

## 2015-11-13 ENCOUNTER — Emergency Department (HOSPITAL_COMMUNITY)
Admission: EM | Admit: 2015-11-13 | Discharge: 2015-11-13 | Disposition: A | Discharge status: Home / Self Care | Payer: Medicare Other | Attending: Emergency Medicine | Admitting: Emergency Medicine

## 2015-11-13 ENCOUNTER — Encounter (HOSPITAL_COMMUNITY): Payer: Self-pay | Admitting: *Deleted

## 2015-11-13 ENCOUNTER — Emergency Department (HOSPITAL_COMMUNITY): Payer: Medicare Other

## 2015-11-13 DIAGNOSIS — M10072 Idiopathic gout, left ankle and foot: Secondary | ICD-10-CM | POA: Diagnosis not present

## 2015-11-13 DIAGNOSIS — M7989 Other specified soft tissue disorders: Secondary | ICD-10-CM | POA: Diagnosis not present

## 2015-11-13 DIAGNOSIS — E119 Type 2 diabetes mellitus without complications: Secondary | ICD-10-CM | POA: Insufficient documentation

## 2015-11-13 DIAGNOSIS — I10 Essential (primary) hypertension: Secondary | ICD-10-CM | POA: Insufficient documentation

## 2015-11-13 DIAGNOSIS — Z79899 Other long term (current) drug therapy: Secondary | ICD-10-CM | POA: Diagnosis not present

## 2015-11-13 DIAGNOSIS — Z21 Asymptomatic human immunodeficiency virus [HIV] infection status: Secondary | ICD-10-CM | POA: Diagnosis not present

## 2015-11-13 DIAGNOSIS — Z87891 Personal history of nicotine dependence: Secondary | ICD-10-CM | POA: Diagnosis not present

## 2015-11-13 DIAGNOSIS — Z7984 Long term (current) use of oral hypoglycemic drugs: Secondary | ICD-10-CM | POA: Insufficient documentation

## 2015-11-13 DIAGNOSIS — M79672 Pain in left foot: Secondary | ICD-10-CM | POA: Diagnosis present

## 2015-11-13 MED ORDER — INDOMETHACIN 25 MG PO CAPS: 25.0000 mg | ORAL_CAPSULE | Freq: Three times a day (TID) | ORAL | Status: DC | PRN

## 2015-11-13 MED ORDER — HYDROCODONE-ACETAMINOPHEN 5-325 MG PO TABS: 2.0000 | ORAL_TABLET | ORAL | Status: DC | PRN

## 2015-11-13 NOTE — ED Notes (Signed)
Pt reports he was doing yard word all day on Friday and now his Lt foot hurts. Pt took 2 doses of advil 800 mg with out relief.

## 2015-11-13 NOTE — ED Notes (Signed)
Declined W/C at D/C and was escorted to lobby by RN. 

## 2015-11-13 NOTE — ED Provider Notes (Signed)
CSN: [id number]     Arrival date & time 11/13/15  0728 History   None    Chief Complaint  Patient presents with  . Foot Pain     (Consider location/radiation/quality/duration/timing/severity/associated sxs/prior Treatment) Patient is a 55 y.o. male presenting with lower extremity pain. The history is provided by the patient. No language interpreter was used.  Foot Pain This is a new problem. The current episode started yesterday. The problem occurs constantly. The problem has been gradually worsening. Associated symptoms include joint swelling. Nothing aggravates the symptoms. He has tried nothing for the symptoms. The treatment provided moderate relief.  Pt complains of pain in his left foot after doing yard work.  Pt reports foot feels swollen  Past Medical History  Diagnosis Date  . HIV (human immunodeficiency virus infection) (HCC)   . Hepatitis C   . Hypertension   . Diabetes mellitus without complication (HCC)     diagnosed 04-2015   Past Surgical History  Procedure Laterality Date  . No past surgeries     Family History  Problem Relation Age of Onset  . Diabetes Mother   . Cancer Mother     unkown type  . Hypertension Father    Social History  Substance Use Topics  . Smoking status: Former Smoker -- 0.30 packs/day for 30 years    Types: Cigarettes    Start date: 07/20/2012    Quit date: 02/17/2014  . Smokeless tobacco: Never Used  . Alcohol Use: 0.6 - 1.2 oz/week    1-2 Cans of beer, 0 Standard drinks or equivalent per week     Comment: occasional beer.    Review of Systems  Musculoskeletal: Positive for joint swelling.  All other systems reviewed and are negative.     Allergies  Review of patient's allergies indicates no known allergies.  Home Medications   Prior to Admission medications   Medication Sig Start Date End Date Taking? Authorizing Provider  atenolol (TENORMIN) 100 MG tablet TAKE ONE TABLET BY MOUTH ONCE DAILY 05/31/15   Jeffrey C  Hatcher, MD  blood glucose meter kit and supplies KIT Dispense based on patient and insurance preference. Use up to four times daily as directed. (FOR ICD-9 250.00, 250.01). 08/03/15   Ejiroghene E Emokpae, MD  diclofenac sodium (VOLTAREN) 1 % GEL Apply 4 g topically 4 (four) times daily. 11/03/15   Jeremy Ford, MD  Elbasvir-Grazoprevir (ZEPATIER) 50-100 MG TABS Take 1 tablet by mouth daily. 08/02/15   Jeffrey C Hatcher, MD  emtricitabine-tenofovir AF (DESCOVY) 200-25 MG tablet Take 1 tablet by mouth daily. 08/03/15   Jeffrey C Hatcher, MD  glipiZIDE (GLUCOTROL) 5 MG tablet Take 1 tablet (5 mg total) by mouth daily before breakfast. 08/27/15   Christopher W Rice, MD  lisinopril-hydrochlorothiazide (PRINZIDE,ZESTORETIC) 20-12.5 MG tablet Take 2 tablets by mouth daily. 08/27/15   Christopher W Rice, MD  metFORMIN (GLUCOPHAGE) 1000 MG tablet TAKE 1 TABLET BY MOUTH TWICE DAILY WITH BREAKFAST AND DINNER 10/25/15   Christopher W Rice, MD  raltegravir (ISENTRESS) 400 MG tablet Take 1 tablet (400 mg total) by mouth 2 (two) times daily. 08/03/15   Jeffrey C Hatcher, MD  ribavirin (REBETOL) 200 MG capsule Take 3 capsules (600 mg total) by mouth 2 (two) times daily with a meal. 08/02/15   Jeffrey C Hatcher, MD  Sofosbuvir (SOVALDI) 400 MG TABS Take 1 tablet by mouth daily. 08/02/15   Jeffrey C Hatcher, MD   BP 133/64 mmHg  Pulse 69  Temp(Src) 98.4 F (  36.9 C)  Resp 18  Ht 5' 4" (1.626 m)  Wt 78.472 kg  BMI 29.68 kg/m2  SpO2 100% Physical Exam  Constitutional: He is oriented to person, place, and time. He appears well-developed and well-nourished.  HENT:  Head: Normocephalic and atraumatic.  Eyes: EOM are normal. Pupils are equal, round, and reactive to light.  Neck: Normal range of motion.  Pulmonary/Chest: Effort normal.  Abdominal: He exhibits no distension.  Musculoskeletal: He exhibits tenderness.  Tender mid left foot,  Pain with movement,  nv and ns intact  Neurological: He is alert and oriented to  person, place, and time.  Skin: Skin is warm.  Psychiatric: He has a normal mood and affect.  Nursing note and vitals reviewed.   ED Course  Procedures (including critical care time) Labs Review Labs Reviewed - No data to display  Imaging Review Dg Foot Complete Left  11/13/2015  CLINICAL DATA:  55-year-old male with anterior foot pain and swelling for the past day. EXAM: LEFT FOOT - COMPLETE 3+ VIEW COMPARISON:  No priors. FINDINGS: Bunion deformity. Along the medial aspect of the head of the first metatarsal there is an erosion with overhanging edges, and superficial soft tissue swelling, highly suspicious for gout. No acute displaced fracture or dislocation. IMPRESSION: 1. Findings suggest gout involving the medial aspect of the distal first metatarsal. 2. Bunion deformity. Electronically Signed   By: Daniel  Entrikin M.D.   On: 11/13/2015 08:16   I have personally reviewed and evaluated these images and lab results as part of my medical decision-making.   EKG Interpretation None      MDMXray shows probable gout.     Final diagnoses:  Acute idiopathic gout of left foot    Meds ordered this encounter  Medications  . indomethacin (INDOCIN) 25 MG capsule    Sig: Take 1 capsule (25 mg total) by mouth 3 (three) times daily as needed.    Dispense:  30 capsule    Refill:  0    Order Specific Question:  Supervising Provider    Answer:  PFEIFFER, MARCY [[id number]]  . HYDROcodone-acetaminophen (NORCO/VICODIN) 5-325 MG tablet    Sig: Take 2 tablets by mouth every 4 (four) hours as needed.    Dispense:  16 tablet    Refill:  0    Order Specific Question:  Supervising Provider    Answer:  PFEIFFER, MARCY [[id number]]    An After Visit Summary was printed and given to the patient.  Leslie K Sofia, PA-C 11/13/15 0825  Elizabeth Rees, MD 11/14/15 0858 

## 2015-11-13 NOTE — Discharge Instructions (Signed)

## 2015-11-16 ENCOUNTER — Ambulatory Visit: Payer: Medicare Other | Admitting: Dietician

## 2015-11-17 ENCOUNTER — Other Ambulatory Visit (HOSPITAL_COMMUNITY): Payer: Self-pay | Admitting: *Deleted

## 2015-11-17 ENCOUNTER — Ambulatory Visit: Payer: Medicaid Other | Admitting: Infectious Diseases

## 2015-11-17 ENCOUNTER — Ambulatory Visit (HOSPITAL_COMMUNITY)
Admission: RE | Admit: 2015-11-17 | Discharge: 2015-11-17 | Disposition: A | Discharge status: Home / Self Care | Payer: Medicare Other | Source: Ambulatory Visit | Attending: Internal Medicine | Admitting: Internal Medicine

## 2015-11-17 ENCOUNTER — Ambulatory Visit (INDEPENDENT_AMBULATORY_CARE_PROVIDER_SITE_OTHER): Payer: Medicare Other | Admitting: Infectious Diseases

## 2015-11-17 ENCOUNTER — Other Ambulatory Visit: Payer: Self-pay

## 2015-11-17 ENCOUNTER — Telehealth: Payer: Self-pay

## 2015-11-17 ENCOUNTER — Encounter (HOSPITAL_COMMUNITY): Payer: Self-pay | Admitting: *Deleted

## 2015-11-17 VITALS — BP 144/89 | HR 69 | Temp 98.2°F | Wt 179.0 lb

## 2015-11-17 VITALS — BP 112/60 | HR 75 | Wt 179.5 lb

## 2015-11-17 DIAGNOSIS — Z833 Family history of diabetes mellitus: Secondary | ICD-10-CM | POA: Insufficient documentation

## 2015-11-17 DIAGNOSIS — Z7984 Long term (current) use of oral hypoglycemic drugs: Secondary | ICD-10-CM | POA: Insufficient documentation

## 2015-11-17 DIAGNOSIS — Z79899 Other long term (current) drug therapy: Secondary | ICD-10-CM | POA: Diagnosis not present

## 2015-11-17 DIAGNOSIS — Z955 Presence of coronary angioplasty implant and graft: Secondary | ICD-10-CM | POA: Insufficient documentation

## 2015-11-17 DIAGNOSIS — I10 Essential (primary) hypertension: Secondary | ICD-10-CM | POA: Insufficient documentation

## 2015-11-17 DIAGNOSIS — I208 Other forms of angina pectoris: Secondary | ICD-10-CM | POA: Insufficient documentation

## 2015-11-17 DIAGNOSIS — Z8249 Family history of ischemic heart disease and other diseases of the circulatory system: Secondary | ICD-10-CM | POA: Diagnosis not present

## 2015-11-17 DIAGNOSIS — Z87891 Personal history of nicotine dependence: Secondary | ICD-10-CM | POA: Insufficient documentation

## 2015-11-17 DIAGNOSIS — B1920 Unspecified viral hepatitis C without hepatic coma: Secondary | ICD-10-CM | POA: Diagnosis not present

## 2015-11-17 DIAGNOSIS — E785 Hyperlipidemia, unspecified: Secondary | ICD-10-CM | POA: Insufficient documentation

## 2015-11-17 DIAGNOSIS — E119 Type 2 diabetes mellitus without complications: Secondary | ICD-10-CM | POA: Insufficient documentation

## 2015-11-17 DIAGNOSIS — B20 Human immunodeficiency virus [HIV] disease: Secondary | ICD-10-CM | POA: Diagnosis not present

## 2015-11-17 DIAGNOSIS — I2510 Atherosclerotic heart disease of native coronary artery without angina pectoris: Secondary | ICD-10-CM | POA: Insufficient documentation

## 2015-11-17 DIAGNOSIS — R079 Chest pain, unspecified: Secondary | ICD-10-CM | POA: Diagnosis not present

## 2015-11-17 DIAGNOSIS — B1710 Acute hepatitis C without hepatic coma: Secondary | ICD-10-CM

## 2015-11-17 DIAGNOSIS — I25118 Atherosclerotic heart disease of native coronary artery with other forms of angina pectoris: Secondary | ICD-10-CM | POA: Insufficient documentation

## 2015-11-17 LAB — COMPREHENSIVE METABOLIC PANEL
ALT: 24 U/L (ref 17–63)
ANION GAP: 10 (ref 5–15)
AST: 27 U/L (ref 15–41)
Albumin: 3.8 g/dL (ref 3.5–5.0)
Alkaline Phosphatase: 78 U/L (ref 38–126)
BUN: 29 mg/dL — AB (ref 6–20)
CHLORIDE: 105 mmol/L (ref 101–111)
CO2: 24 mmol/L (ref 22–32)
Calcium: 10.1 mg/dL (ref 8.9–10.3)
Creatinine, Ser: 1.33 mg/dL — ABNORMAL HIGH (ref 0.61–1.24)
GFR calc Af Amer: >60 mL/min (ref >60)
GFR, EST NON AFRICAN AMERICAN: 58 mL/min — AB (ref >60)
Glucose, Bld: 80 mg/dL (ref 65–99)
POTASSIUM: 4.2 mmol/L (ref 3.5–5.1)
Sodium: 139 mmol/L (ref 135–145)
Total Bilirubin: 0.9 mg/dL (ref 0.3–1.2)
Total Protein: 7.6 g/dL (ref 6.5–8.1)

## 2015-11-17 LAB — CBC
HEMATOCRIT: 31 % — AB (ref 39.0–52.0)
HEMOGLOBIN: 9.3 g/dL — AB (ref 13.0–17.0)
MCH: 30.4 pg (ref 26.0–34.0)
MCHC: 30 g/dL (ref 30.0–36.0)
MCV: 101.3 fL — AB (ref 78.0–100.0)
Platelets: 431 10*3/uL — ABNORMAL HIGH (ref 150–400)
RBC: 3.06 MIL/uL — AB (ref 4.22–5.81)
RDW: 14.9 % (ref 11.5–15.5)
WBC: 5.4 10*3/uL (ref 4.0–10.5)

## 2015-11-17 LAB — PROTIME-INR
INR: 1.17 (ref 0.00–1.49)
Prothrombin Time: 15.1 seconds (ref 11.6–15.2)

## 2015-11-17 MED ORDER — NITROGLYCERIN 0.4 MG SL SUBL: 0.4000 mg | SUBLINGUAL_TABLET | SUBLINGUAL | Status: AC | PRN

## 2015-11-17 MED ORDER — ASPIRIN EC 81 MG PO TBEC: 81.0000 mg | DELAYED_RELEASE_TABLET | Freq: Every day | ORAL | Status: AC

## 2015-11-17 NOTE — Progress Notes (Signed)
   Subjective:    Patient ID: John Dickerson, male    DOB: [birth year], 55 y.o.   MRN: [id number]  HPI 55 yo M with hx of HIV+ was Dx 1998, and HTN. Hep C 1a, 14.8 million VL October 2015.  He was on atripla til changed to Bivalve for Hep C rx.  Previously Hep A/B (S Ab)+. He was started on zepatier/sovaldi/rib (plan 16 weeks) on 2-25. His ART was changed to ISN/Desc at that time.  Hep C RNA (-) on 5-5.  No problems with ART. No missed.  Feels like his BP has been "pretty good". occas checks at home.   HIV 1 RNA QUANT (copies/mL)  Date Value  09/02/2015 61*  04/23/2015 <20  03/11/2015 <20   CD4 T CELL ABS (/uL)  Date Value  04/22/2015 540  03/11/2015 540  08/10/2014 430   Has chest tightness when he goes for a walk. Not sure how long. Can walk 45 min to 1 hour unless he has CP.  No radiation. No SOB. No nausea.  No prev CV eval.   Review of Systems  Constitutional: Negative for appetite change and unexpected weight change.  Respiratory: Negative for shortness of breath.   Cardiovascular: Positive for chest pain. Negative for leg swelling.  Gastrointestinal: Negative for diarrhea and constipation.  Genitourinary: Negative for difficulty urinating.  Neurological: Negative for headaches.       Objective:   Physical Exam  Constitutional: He appears well-developed and well-nourished.  HENT:  Mouth/Throat: No oropharyngeal exudate.  Eyes: EOM are normal. Pupils are equal, round, and reactive to light.  Neck: Neck supple.  Cardiovascular: Normal rate, regular rhythm and normal heart sounds.   Pulses:      Radial pulses are 3+ on the right side, and 3+ on the left side.  Pulmonary/Chest: Effort normal and breath sounds normal.  Abdominal: Soft. Bowel sounds are normal. There is no tenderness. There is no rebound.  Musculoskeletal: He exhibits no edema.  Lymphadenopathy:    He has no cervical adenopathy.       Assessment & Plan:

## 2015-11-17 NOTE — Assessment & Plan Note (Signed)
Doing very well. Should complete hep C rx soon.

## 2015-11-17 NOTE — Telephone Encounter (Signed)
Called patient to notify him of his appointment at [facility] 1st floor cardiology today. He is to get there as soon as possible.  Left message detailing this. Rodman Key, LPN

## 2015-11-17 NOTE — Patient Instructions (Signed)
Start Aspirin 81 mg daily   We have sent you in a prescription for Nitroglycerin, you can place 1 tab under your tongue for chest pain, can repeat after 5 min, if you take 3 and still no relief in pain go to ER  Your physician has requested that you have a cardiac catheterization. Cardiac catheterization is used to diagnose and/or treat various heart conditions. Doctors may recommend this procedure for a number of different reasons. The most common reason is to evaluate chest pain. Chest pain can be a symptom of coronary artery disease (CAD), and cardiac catheterization can show whether plaque is narrowing or blocking your heart's arteries. This procedure is also used to evaluate the valves, as well as measure the blood flow and oxygen levels in different parts of your heart. For further information please visit [email or web address]. Please follow instruction sheet, as given.

## 2015-11-17 NOTE — Assessment & Plan Note (Signed)
Slightly elevated today.  

## 2015-11-17 NOTE — Progress Notes (Signed)
Patient ID: John Dickerson, male   DOB: 03-28-1961, 55 y.o.   MRN: [id number]    Referring Physician: Johnnye Sima Primary Cardiologist: None Reason for Consultation: CP   HPI:  55 y/o male with HIV, HCV, recently diagnosed DM2, HL, former smoker and CAD with previous coronary stent. Referred by Dr. Johnnye Sima for further evaluation of CP. [name]   Says he got a stent about 20 years ago here but doesn't remember the details.  A few months ago developed exertional angina. It has been progressive. Says chest now gets tight every time he walks. Stops and it goes away. Now happening after walking a block. No rest angina. No bleeding.   Taking HIV and HCV meds regularly. Statin stopped due to interaction with HIV meds.     Review of Systems:     Cardiac Review of Systems: {Y] = yes _0  = no  Chest Pain [  y  ]  Resting SOB [   ] Exertional SOB  Blue.Reese  ]  Orthopnea [  ]   Pedal Edema [   ]    Palpitations [  ] Syncope  [  ]   Presyncope [   ]  General Review of Systems: [Y] = yes [  ]=no Constitional: recent weight change [  ]; anorexia [  ]; fatigue [  ]; nausea [  ]; night sweats [  ]; fever [  ]; or chills [  ];                                                                      Eyes : blurred vision [  ]; diplopia [   ]; vision changes [  ];  Amaurosis fugax[  ]; Resp: cough [  ];  wheezing[  ];  hemoptysis[  ];  PND [  ];  GI:  gallstones[  ], vomiting[  ];  dysphagia[  ]; melena[  ];  hematochezia [  ]; heartburn[  ];   GU: kidney stones [  ]; hematuria[  ];   dysuria [  ];  nocturia[  ]; incontinence [  ];             Skin: rash, swelling[  ];, hair loss[  ];  peripheral edema[  ];  or itching[  ]; Musculosketetal: myalgias[  ];  joint swelling[  ];  joint erythema[  ];  joint pain[ y ];  back pain[  ];  Heme/Lymph: bruising[  ];  bleeding[  ];  anemia[  ];  Neuro: TIA[  ];  headaches[  ];  stroke[  ];  vertigo[  ];  seizures[  ];   paresthesias[  ];  difficulty walking[   ];  Psych:depression[  ]; anxiety[  ];  Endocrine: diabetes[  ];  thyroid dysfunction[  ];  Other:  Past Medical History  Diagnosis Date  . HIV (human immunodeficiency virus infection) ([place])   . Hepatitis C   . Hypertension   . Diabetes mellitus without complication ([place])     diagnosed 04-2015    Prior to Admission medications   Medication Sig Start Date End Date Taking? Authorizing Provider  atenolol (TENORMIN) 100 MG tablet TAKE ONE TABLET BY MOUTH ONCE DAILY 05/31/15  Yes Dellis Filbert  Chevis Pretty, MD  blood glucose meter kit and supplies KIT Dispense based on patient and insurance preference. Use up to four times daily as directed. (FOR ICD-9 250.00, 250.01). 08/03/15  Yes Ejiroghene E Emokpae, MD  diclofenac sodium (VOLTAREN) 1 % GEL Apply 4 g topically 4 (four) times daily. 11/03/15  Yes Liberty Handy, MD  Elbasvir-Grazoprevir (ZEPATIER) 50-100 MG TABS Take 1 tablet by mouth daily. 08/02/15  Yes Campbell Riches, MD  emtricitabine-tenofovir AF (DESCOVY) 200-25 MG tablet Take 1 tablet by mouth daily. 08/03/15  Yes Campbell Riches, MD  glipiZIDE (GLUCOTROL) 5 MG tablet Take 1 tablet (5 mg total) by mouth daily before breakfast. 08/27/15  Yes Collier Salina, MD  HYDROcodone-acetaminophen (NORCO/VICODIN) 5-325 MG tablet Take 2 tablets by mouth every 4 (four) hours as needed. 11/13/15  Yes Hollace Kinnier Sofia, PA-C  indomethacin (INDOCIN) 25 MG capsule Take 1 capsule (25 mg total) by mouth 3 (three) times daily as needed. 11/13/15  Yes Hollace Kinnier Sofia, PA-C  lisinopril-hydrochlorothiazide (PRINZIDE,ZESTORETIC) 20-12.5 MG tablet Take 2 tablets by mouth daily. 08/27/15  Yes Collier Salina, MD  metFORMIN (GLUCOPHAGE) 1000 MG tablet TAKE 1 TABLET BY MOUTH TWICE DAILY WITH BREAKFAST AND DINNER 10/25/15  Yes Collier Salina, MD  raltegravir (ISENTRESS) 400 MG tablet Take 1 tablet (400 mg total) by mouth 2 (two) times daily. 08/03/15  Yes Campbell Riches, MD  ribavirin (REBETOL) 200 MG capsule Take 3  capsules (600 mg total) by mouth 2 (two) times daily with a meal. 08/02/15  Yes Campbell Riches, MD  Sofosbuvir (SOVALDI) 400 MG TABS Take 1 tablet by mouth daily. 08/02/15  Yes Campbell Riches, MD     Infusions:   No Known Allergies  Social History   Social History  . Marital Status: Single    Spouse Name: N/A  . Number of Children: N/A  . Years of Education: N/A   Occupational History  . Not on file.   Social History Main Topics  . Smoking status: Former Smoker -- 0.30 packs/day for 30 years    Types: Cigarettes    Start date: 07/20/2012    Quit date: 02/17/2014  . Smokeless tobacco: Never Used  . Alcohol Use: 0.6 - 1.2 oz/week    1-2 Cans of beer, 0 Standard drinks or equivalent per week     Comment: occasional beer.  . Drug Use: No  . Sexual Activity: Not on file     Comment: pt. given condoms   Other Topics Concern  . Not on file   Social History Narrative    Family History  Problem Relation Age of Onset  . Diabetes Mother   . Cancer Mother     unkown type  . Hypertension Father     PHYSICAL EXAM: Filed Vitals:   11/17/15 1236  BP: 112/60  Pulse: 75    No intake or output data in the 24 hours ending 11/17/15 1625  General:  Well appearing. No respiratory difficulty HEENT: normal Neck: supple. no JVD. Carotids 2+ bilat; no bruits. No lymphadenopathy or thryomegaly appreciated. Cor: PMI nondisplaced. Regular rate & rhythm. +S4 no murmur Lungs: clear Abdomen: soft, nontender, nondistended. No hepatosplenomegaly. No bruits or masses. Good bowel sounds. Extremities: no cyanosis, clubbing, rash, edema Neuro: alert & oriented x 3, cranial nerves grossly intact. moves all 4 extremities w/o difficulty. Affect pleasant.  ECG: NSR Mild j-point elevation and u-waves. No ST-T wave abnormalities.    Results for orders placed or performed during  the hospital encounter of 11/17/15 (from the past 24 hour(s))  Comprehensive Metabolic Panel (CMET)     Status:  Abnormal   Collection Time: 11/17/15  2:09 PM  Result Value Ref Range   Sodium 139 135 - 145 mmol/L   Potassium 4.2 3.5 - 5.1 mmol/L   Chloride 105 101 - 111 mmol/L   CO2 24 22 - 32 mmol/L   Glucose, Bld 80 65 - 99 mg/dL   BUN 29 (H) 6 - 20 mg/dL   Creatinine, Ser 1.33 (H) 0.61 - 1.24 mg/dL   Calcium 10.1 8.9 - 10.3 mg/dL   Total Protein 7.6 6.5 - 8.1 g/dL   Albumin 3.8 3.5 - 5.0 g/dL   AST 27 15 - 41 U/L   ALT 24 17 - 63 U/L   Alkaline Phosphatase 78 38 - 126 U/L   Total Bilirubin 0.9 0.3 - 1.2 mg/dL   GFR calc non Af Amer 58 (L) >60 mL/min   GFR calc Af Amer >60 >60 mL/min   Anion gap 10 5 - 15  CBC     Status: Abnormal   Collection Time: 11/17/15  2:09 PM  Result Value Ref Range   WBC 5.4 4.0 - 10.5 K/uL   RBC 3.06 (L) 4.22 - 5.81 MIL/uL   Hemoglobin 9.3 (L) 13.0 - 17.0 g/dL   HCT 31.0 (L) 39.0 - 52.0 %   MCV 101.3 (H) 78.0 - 100.0 fL   MCH 30.4 26.0 - 34.0 pg   MCHC 30.0 30.0 - 36.0 g/dL   RDW 14.9 11.5 - 15.5 %   Platelets 431 (H) 150 - 400 K/uL  INR/PT     Status: None   Collection Time: 11/17/15  2:09 PM  Result Value Ref Range   Prothrombin Time 15.1 11.6 - 15.2 seconds   INR 1.17 0.00 - 1.49   No results found.   ASSESSMENT: 1. Prgressive exertional angina 2. CAD s/p previous coronary stent details unknown 3. DM2 4. HTN 5. HL 6. HIV 7. HCV  PLAN/DISCUSSION:  He has progressive exertional angina. Will need cath this week. Start ecasa 81. I gave his prescription for prn NTG. Call 911 if develops severe CP. I discussed statin therapy with Dr. Johnnye Sima. Ok to use atorva 20.   I have reviewed the risks, indications, and alternatives to angioplasty and stenting with the patient. Risks include but are not limited to bleeding, infection, vascular injury, stroke, myocardial infection, arrhythmia, kidney injury, radiation-related injury in the case of prolonged fluoroscopy use, emergency cardiac surgery, and death. The patient understands the risks of serious  complication is low (<1%) and he agrees to proceed.   Rosendo Couser,MD 4:25 PM

## 2015-11-17 NOTE — Assessment & Plan Note (Signed)
He is doing well. Has blip.  Will see him back in 3 months.  Will send him to CV. His ECG shows septal infarct, ST up in V2 only.  Will ask CV to eval ECG.

## 2015-11-19 ENCOUNTER — Other Ambulatory Visit: Payer: Self-pay

## 2015-11-19 ENCOUNTER — Ambulatory Visit (HOSPITAL_COMMUNITY)
Admission: RE | Admit: 2015-11-19 | Discharge: 2015-11-20 | Disposition: A | Discharge status: Home / Self Care | Payer: Medicare Other | Source: Ambulatory Visit | Attending: Internal Medicine | Admitting: Internal Medicine

## 2015-11-19 ENCOUNTER — Encounter (HOSPITAL_COMMUNITY)
Admission: RE | Disposition: A | Discharge status: Home / Self Care | Payer: Self-pay | Source: Ambulatory Visit | Attending: Internal Medicine

## 2015-11-19 ENCOUNTER — Encounter (HOSPITAL_COMMUNITY): Payer: Self-pay | Admitting: General Practice

## 2015-11-19 DIAGNOSIS — I10 Essential (primary) hypertension: Secondary | ICD-10-CM | POA: Insufficient documentation

## 2015-11-19 DIAGNOSIS — Z7984 Long term (current) use of oral hypoglycemic drugs: Secondary | ICD-10-CM | POA: Insufficient documentation

## 2015-11-19 DIAGNOSIS — E119 Type 2 diabetes mellitus without complications: Secondary | ICD-10-CM | POA: Diagnosis not present

## 2015-11-19 DIAGNOSIS — E785 Hyperlipidemia, unspecified: Secondary | ICD-10-CM | POA: Insufficient documentation

## 2015-11-19 DIAGNOSIS — Z87891 Personal history of nicotine dependence: Secondary | ICD-10-CM | POA: Insufficient documentation

## 2015-11-19 DIAGNOSIS — N521 Erectile dysfunction due to diseases classified elsewhere: Secondary | ICD-10-CM

## 2015-11-19 DIAGNOSIS — B1920 Unspecified viral hepatitis C without hepatic coma: Secondary | ICD-10-CM | POA: Diagnosis not present

## 2015-11-19 DIAGNOSIS — Z79899 Other long term (current) drug therapy: Secondary | ICD-10-CM | POA: Diagnosis not present

## 2015-11-19 DIAGNOSIS — R079 Chest pain, unspecified: Secondary | ICD-10-CM | POA: Diagnosis present

## 2015-11-19 DIAGNOSIS — B20 Human immunodeficiency virus [HIV] disease: Secondary | ICD-10-CM | POA: Insufficient documentation

## 2015-11-19 DIAGNOSIS — T82858A Stenosis of vascular prosthetic devices, implants and grafts, initial encounter: Secondary | ICD-10-CM | POA: Diagnosis not present

## 2015-11-19 DIAGNOSIS — I2510 Atherosclerotic heart disease of native coronary artery without angina pectoris: Secondary | ICD-10-CM | POA: Diagnosis present

## 2015-11-19 DIAGNOSIS — Z95820 Peripheral vascular angioplasty status with implants and grafts: Secondary | ICD-10-CM

## 2015-11-19 DIAGNOSIS — I25119 Atherosclerotic heart disease of native coronary artery with unspecified angina pectoris: Secondary | ICD-10-CM

## 2015-11-19 DIAGNOSIS — I208 Other forms of angina pectoris: Secondary | ICD-10-CM | POA: Diagnosis present

## 2015-11-19 DIAGNOSIS — Y831 Surgical operation with implant of artificial internal device as the cause of abnormal reaction of the patient, or of later complication, without mention of misadventure at the time of the procedure: Secondary | ICD-10-CM | POA: Insufficient documentation

## 2015-11-19 DIAGNOSIS — I25118 Atherosclerotic heart disease of native coronary artery with other forms of angina pectoris: Secondary | ICD-10-CM | POA: Diagnosis not present

## 2015-11-19 DIAGNOSIS — E1159 Type 2 diabetes mellitus with other circulatory complications: Secondary | ICD-10-CM

## 2015-11-19 HISTORY — DX: Type 2 diabetes mellitus without complications: E11.9

## 2015-11-19 HISTORY — PX: CARDIAC CATHETERIZATION: SHX172

## 2015-11-19 HISTORY — DX: Pure hypercholesterolemia, unspecified: E78.00

## 2015-11-19 HISTORY — PX: CORONARY ANGIOPLASTY WITH STENT PLACEMENT: SHX49

## 2015-11-19 HISTORY — DX: Personal history of other diseases of the musculoskeletal system and connective tissue: Z87.39

## 2015-11-19 HISTORY — DX: Unspecified osteoarthritis, unspecified site: M19.90

## 2015-11-19 LAB — CBC
HCT: 30.9 % — ABNORMAL LOW (ref 39.0–52.0)
HEMOGLOBIN: 9.4 g/dL — AB (ref 13.0–17.0)
MCH: 30.9 pg (ref 26.0–34.0)
MCHC: 30.4 g/dL (ref 30.0–36.0)
MCV: 101.6 fL — ABNORMAL HIGH (ref 78.0–100.0)
PLATELETS: 378 10*3/uL (ref 150–400)
RBC: 3.04 MIL/uL — AB (ref 4.22–5.81)
RDW: 15.1 % (ref 11.5–15.5)
WBC: 5.9 10*3/uL (ref 4.0–10.5)

## 2015-11-19 LAB — CREATININE, SERUM: CREATININE: 1.11 mg/dL (ref 0.61–1.24)

## 2015-11-19 LAB — GLUCOSE, CAPILLARY
GLUCOSE-CAPILLARY: 127 mg/dL — AB (ref 65–99)
GLUCOSE-CAPILLARY: 106 mg/dL — AB (ref 65–99)
GLUCOSE-CAPILLARY: 219 mg/dL — AB (ref 65–99)
GLUCOSE-CAPILLARY: 112 mg/dL — AB (ref 65–99)

## 2015-11-19 LAB — POCT ACTIVATED CLOTTING TIME
ACTIVATED CLOTTING TIME: 368 s
Activated Clotting Time: 183 seconds

## 2015-11-19 SURGERY — LEFT HEART CATH AND CORONARY ANGIOGRAPHY

## 2015-11-19 MED ORDER — CLOPIDOGREL BISULFATE 300 MG PO TABS
ORAL_TABLET | ORAL | Status: AC
  Filled 2015-11-19: qty 1

## 2015-11-19 MED ORDER — MIDAZOLAM HCL 2 MG/2ML IJ SOLN
INTRAMUSCULAR | Status: AC
  Filled 2015-11-19: qty 2

## 2015-11-19 MED ORDER — SODIUM CHLORIDE 0.9% FLUSH: 3.0000 mL | Freq: Two times a day (BID) | INTRAVENOUS | Status: DC

## 2015-11-19 MED ORDER — ASPIRIN 81 MG PO CHEW
81.0000 mg | CHEWABLE_TABLET | Freq: Once | ORAL | Status: AC
  Administered 2015-11-19: 81 mg via ORAL

## 2015-11-19 MED ORDER — LIDOCAINE HCL (PF) 1 % IJ SOLN
INTRAMUSCULAR | Status: DC | PRN
  Administered 2015-11-19: 28 mL
  Administered 2015-11-19: 2 mL

## 2015-11-19 MED ORDER — LISINOPRIL 10 MG PO TABS
20.0000 mg | ORAL_TABLET | Freq: Every day | ORAL | Status: DC
  Administered 2015-11-19 – 2015-11-20 (×2): 20 mg via ORAL
  Filled 2015-11-19 (×2): qty 2

## 2015-11-19 MED ORDER — HEPARIN (PORCINE) IN NACL 2-0.9 UNIT/ML-% IJ SOLN
INTRAMUSCULAR | Status: DC | PRN
  Administered 2015-11-19: 10 mL via INTRA_ARTERIAL

## 2015-11-19 MED ORDER — BIVALIRUDIN 250 MG IV SOLR
250.0000 mg | INTRAVENOUS | Status: DC | PRN
  Administered 2015-11-19: 1.75 mg/kg/h via INTRAVENOUS

## 2015-11-19 MED ORDER — ASPIRIN 81 MG PO CHEW
CHEWABLE_TABLET | ORAL | Status: AC
Start: 2015-11-19 — End: 2015-11-19
  Administered 2015-11-19: 81 mg via ORAL
  Filled 2015-11-19: qty 1

## 2015-11-19 MED ORDER — FENTANYL CITRATE (PF) 100 MCG/2ML IJ SOLN
INTRAMUSCULAR | Status: AC
  Filled 2015-11-19: qty 2

## 2015-11-19 MED ORDER — RALTEGRAVIR POTASSIUM 400 MG PO TABS
400.0000 mg | ORAL_TABLET | Freq: Two times a day (BID) | ORAL | Status: DC
  Administered 2015-11-19: 400 mg via ORAL
  Filled 2015-11-19 (×2): qty 1

## 2015-11-19 MED ORDER — HEPARIN (PORCINE) IN NACL 2-0.9 UNIT/ML-% IJ SOLN
INTRAMUSCULAR | Status: AC
  Filled 2015-11-19: qty 1500

## 2015-11-19 MED ORDER — CLOPIDOGREL BISULFATE 300 MG PO TABS
ORAL_TABLET | ORAL | Status: DC | PRN
  Administered 2015-11-19: 600 mg via ORAL

## 2015-11-19 MED ORDER — ASPIRIN EC 81 MG PO TBEC
81.0000 mg | DELAYED_RELEASE_TABLET | Freq: Every day | ORAL | Status: DC
  Administered 2015-11-20: 08:00:00 81 mg via ORAL
  Filled 2015-11-19: qty 1

## 2015-11-19 MED ORDER — SODIUM CHLORIDE 0.9 % IV SOLN: INTRAVENOUS | Status: AC

## 2015-11-19 MED ORDER — IOPAMIDOL (ISOVUE-370) INJECTION 76%
INTRAVENOUS | Status: AC
  Filled 2015-11-19: qty 100

## 2015-11-19 MED ORDER — IOPAMIDOL (ISOVUE-370) INJECTION 76%
INTRAVENOUS | Status: AC
  Filled 2015-11-19: qty 50

## 2015-11-19 MED ORDER — INSULIN ASPART 100 UNIT/ML ~~LOC~~ SOLN: 0.0000 [IU] | Freq: Every day | SUBCUTANEOUS | Status: DC

## 2015-11-19 MED ORDER — BIVALIRUDIN BOLUS VIA INFUSION - CUPID
INTRAVENOUS | Status: DC | PRN
  Administered 2015-11-19: 60.9 mg via INTRAVENOUS

## 2015-11-19 MED ORDER — ONDANSETRON HCL 4 MG/2ML IJ SOLN: 4.0000 mg | Freq: Four times a day (QID) | INTRAMUSCULAR | Status: DC | PRN

## 2015-11-19 MED ORDER — HYDROCODONE-ACETAMINOPHEN 5-325 MG PO TABS: 2.0000 | ORAL_TABLET | ORAL | Status: DC | PRN

## 2015-11-19 MED ORDER — EMTRICITABINE-TENOFOVIR AF 200-25 MG PO TABS
1.0000 | ORAL_TABLET | Freq: Every day | ORAL | Status: DC
  Administered 2015-11-19: 18:00:00 1 via ORAL
  Filled 2015-11-19 (×2): qty 1

## 2015-11-19 MED ORDER — SOFOSBUVIR 400 MG PO TABS: 1.0000 | ORAL_TABLET | Freq: Every day | ORAL | Status: DC

## 2015-11-19 MED ORDER — NITROGLYCERIN 1 MG/10 ML FOR IR/CATH LAB
INTRA_ARTERIAL | Status: AC
  Filled 2015-11-19: qty 10

## 2015-11-19 MED ORDER — CLOPIDOGREL BISULFATE 75 MG PO TABS
75.0000 mg | ORAL_TABLET | Freq: Every day | ORAL | Status: DC
  Administered 2015-11-20: 08:00:00 75 mg via ORAL
  Filled 2015-11-19: qty 1

## 2015-11-19 MED ORDER — SODIUM CHLORIDE 0.9 % IV SOLN: 250.0000 mL | INTRAVENOUS | Status: DC | PRN

## 2015-11-19 MED ORDER — RIBAVIRIN 200 MG PO CAPS: 600.0000 mg | ORAL_CAPSULE | Freq: Two times a day (BID) | ORAL | Status: DC

## 2015-11-19 MED ORDER — NITROGLYCERIN 1 MG/10 ML FOR IR/CATH LAB
INTRA_ARTERIAL | Status: DC | PRN
  Administered 2015-11-19 (×2): 200 ug via INTRACORONARY

## 2015-11-19 MED ORDER — SODIUM CHLORIDE 0.9 % IV SOLN
INTRAVENOUS | Status: DC
  Administered 2015-11-19: 08:00:00 via INTRAVENOUS

## 2015-11-19 MED ORDER — HEPARIN SODIUM (PORCINE) 1000 UNIT/ML IJ SOLN
INTRAMUSCULAR | Status: AC
  Filled 2015-11-19: qty 1

## 2015-11-19 MED ORDER — ATENOLOL 50 MG PO TABS
100.0000 mg | ORAL_TABLET | Freq: Every day | ORAL | Status: DC
  Administered 2015-11-19 – 2015-11-20 (×2): 100 mg via ORAL
  Filled 2015-11-19 (×2): qty 2

## 2015-11-19 MED ORDER — VERAPAMIL HCL 2.5 MG/ML IV SOLN
INTRAVENOUS | Status: AC
  Filled 2015-11-19: qty 2

## 2015-11-19 MED ORDER — HEPARIN SODIUM (PORCINE) 1000 UNIT/ML IJ SOLN
INTRAMUSCULAR | Status: DC | PRN
  Administered 2015-11-19: 4000 [IU] via INTRAVENOUS

## 2015-11-19 MED ORDER — ELBASVIR-GRAZOPREVIR 50-100 MG PO TABS: 1.0000 | ORAL_TABLET | Freq: Every day | ORAL | Status: DC

## 2015-11-19 MED ORDER — ANGIOPLASTY BOOK
Freq: Once | Status: AC
  Administered 2015-11-19: 21:00:00
  Filled 2015-11-19: qty 1

## 2015-11-19 MED ORDER — FENTANYL CITRATE (PF) 100 MCG/2ML IJ SOLN
INTRAMUSCULAR | Status: DC | PRN
  Administered 2015-11-19 (×3): 25 ug via INTRAVENOUS
  Administered 2015-11-19: 50 ug via INTRAVENOUS
  Administered 2015-11-19 (×2): 25 ug via INTRAVENOUS

## 2015-11-19 MED ORDER — SODIUM CHLORIDE 0.9% FLUSH: 3.0000 mL | INTRAVENOUS | Status: DC | PRN

## 2015-11-19 MED ORDER — HYDROCHLOROTHIAZIDE 12.5 MG PO CAPS
12.5000 mg | ORAL_CAPSULE | Freq: Every day | ORAL | Status: DC
  Administered 2015-11-19 – 2015-11-20 (×2): 12.5 mg via ORAL
  Filled 2015-11-19 (×2): qty 1

## 2015-11-19 MED ORDER — HEPARIN SODIUM (PORCINE) 5000 UNIT/ML IJ SOLN
5000.0000 [IU] | Freq: Three times a day (TID) | INTRAMUSCULAR | Status: DC
  Administered 2015-11-20: 07:00:00 5000 [IU] via SUBCUTANEOUS
  Filled 2015-11-19: qty 1

## 2015-11-19 MED ORDER — MIDAZOLAM HCL 2 MG/2ML IJ SOLN
INTRAMUSCULAR | Status: DC | PRN
  Administered 2015-11-19: 2 mg via INTRAVENOUS
  Administered 2015-11-19 (×5): 1 mg via INTRAVENOUS
  Administered 2015-11-19: 2 mg via INTRAVENOUS

## 2015-11-19 MED ORDER — INSULIN ASPART 100 UNIT/ML ~~LOC~~ SOLN: 0.0000 [IU] | Freq: Three times a day (TID) | SUBCUTANEOUS | Status: DC

## 2015-11-19 MED ORDER — NITROGLYCERIN 0.4 MG SL SUBL: 0.4000 mg | SUBLINGUAL_TABLET | SUBLINGUAL | Status: DC | PRN

## 2015-11-19 MED ORDER — ASPIRIN 81 MG PO CHEW: 81.0000 mg | CHEWABLE_TABLET | Freq: Every day | ORAL | Status: DC

## 2015-11-19 MED ORDER — LIDOCAINE HCL (PF) 1 % IJ SOLN
INTRAMUSCULAR | Status: AC
  Filled 2015-11-19: qty 30

## 2015-11-19 MED ORDER — ACETAMINOPHEN 325 MG PO TABS: 650.0000 mg | ORAL_TABLET | ORAL | Status: DC | PRN

## 2015-11-19 MED ORDER — GLIPIZIDE 5 MG PO TABS
5.0000 mg | ORAL_TABLET | Freq: Every day | ORAL | Status: DC
  Administered 2015-11-20: 07:00:00 5 mg via ORAL
  Filled 2015-11-19: qty 1

## 2015-11-19 MED ORDER — LISINOPRIL-HYDROCHLOROTHIAZIDE 20-12.5 MG PO TABS: 2.0000 | ORAL_TABLET | Freq: Every day | ORAL | Status: DC

## 2015-11-19 MED ORDER — BIVALIRUDIN 250 MG IV SOLR
INTRAVENOUS | Status: AC
  Filled 2015-11-19: qty 250

## 2015-11-19 SURGICAL SUPPLY — 32 items
BALLN ANGIOSCULPT RX 2.5X10 (BALLOONS) ×3
BALLN ~~LOC~~ EMERGE MR 3.5X15 (BALLOONS) ×3
BALLOON ANGIOSCULPT RX 2.5X10 (BALLOONS) ×1 IMPLANT
BALLOON ~~LOC~~ EMERGE MR 3.5X15 (BALLOONS) ×1 IMPLANT
CATH INFINITI 4FR 3 DRC (CATHETERS) ×3 IMPLANT
CATH INFINITI 5 FR AR1 MOD (CATHETERS) ×3 IMPLANT
CATH INFINITI 5 FR JL3.5 (CATHETERS) ×3 IMPLANT
CATH INFINITI 5 FR LCB (CATHETERS) ×3 IMPLANT
CATH INFINITI 5FR ANG PIGTAIL (CATHETERS) ×3 IMPLANT
CATH INFINITI 5FR JL4 (CATHETERS) ×3 IMPLANT
CATH INFINITI JR4 5F (CATHETERS) ×3 IMPLANT
CATH LAUNCHER 5F RADR (CATHETERS) ×1 IMPLANT
CATH SITESEER 5F NTR (CATHETERS) ×3 IMPLANT
CATHETER LAUNCHER 5F RADR (CATHETERS) ×3
DEVICE RAD COMP TR BAND LRG (VASCULAR PRODUCTS) ×3 IMPLANT
GLIDESHEATH SLEND SS 6F .021 (SHEATH) ×3 IMPLANT
GUIDE CATH RUNWAY 6FR CLS3.5 (CATHETERS) ×3 IMPLANT
KIT ENCORE 26 ADVANTAGE (KITS) ×3 IMPLANT
KIT HEART LEFT (KITS) ×3 IMPLANT
PACK CARDIAC CATHETERIZATION (CUSTOM PROCEDURE TRAY) ×3 IMPLANT
SHEATH PINNACLE 5F 10CM (SHEATH) ×3 IMPLANT
SHEATH PINNACLE 6F 10CM (SHEATH) ×3 IMPLANT
STENT SYNERGY DES 2.5X24 (Permanent Stent) ×3 IMPLANT
STENT SYNERGY DES 3X24 (Permanent Stent) ×3 IMPLANT
SYR MEDRAD MARK V 150ML (SYRINGE) ×3 IMPLANT
TRANSDUCER W/STOPCOCK (MISCELLANEOUS) ×3 IMPLANT
TUBING CIL FLEX 10 FLL-RA (TUBING) ×3 IMPLANT
VALVE GUARDIAN II ~~LOC~~ HEMO (MISCELLANEOUS) ×3 IMPLANT
WIRE ASAHI PROWATER 180CM (WIRE) ×3 IMPLANT
WIRE EMERALD 3MM-J .035X150CM (WIRE) ×3 IMPLANT
WIRE HI TORQ VERSACORE-J 145CM (WIRE) ×3 IMPLANT
WIRE SAFE-T 1.5MM-J .035X260CM (WIRE) ×3 IMPLANT

## 2015-11-19 NOTE — Progress Notes (Signed)
TR BAND REMOVAL  LOCATION:    right radial  DEFLATED PER PROTOCOL:    Yes.    TIME BAND OFF / DRESSING APPLIED:    1800   SITE UPON ARRIVAL:    Level 0  SITE AFTER BAND REMOVAL:    Level 0  CIRCULATION SENSATION AND MOVEMENT:    Within Normal Limits   Yes.    COMMENTS:   Tolerated procedure well 

## 2015-11-19 NOTE — H&P (View-Only) (Signed)
Patient ID: John Dickerson, male   DOB: 09/17/1960, 55 y.o.   MRN: [id number]    Referring Physician: Hatcher Primary Cardiologist: None Reason for Consultation: CP   HPI:  55 y/o male with HIV, HCV, recently diagnosed DM2, HL, former smoker and CAD with previous coronary stent. Referred by Dr. Hatcher for further evaluation of CP. .   Says he got a stent about 20 years ago here but doesn't remember the details.  A few months ago developed exertional angina. It has been progressive. Says chest now gets tight every time he walks. Stops and it goes away. Now happening after walking a block. No rest angina. No bleeding.   Taking HIV and HCV meds regularly. Statin stopped due to interaction with HIV meds.     Review of Systems:     Cardiac Review of Systems: {Y] = yes [ ] = no  Chest Pain [  y  ]  Resting SOB [   ] Exertional SOB  [y  ]  Orthopnea [  ]   Pedal Edema [   ]    Palpitations [  ] Syncope  [  ]   Presyncope [   ]  General Review of Systems: [Y] = yes [  ]=no Constitional: recent weight change [  ]; anorexia [  ]; fatigue [  ]; nausea [  ]; night sweats [  ]; fever [  ]; or chills [  ];                                                                      Eyes : blurred vision [  ]; diplopia [   ]; vision changes [  ];  Amaurosis fugax[  ]; Resp: cough [  ];  wheezing[  ];  hemoptysis[  ];  PND [  ];  GI:  gallstones[  ], vomiting[  ];  dysphagia[  ]; melena[  ];  hematochezia [  ]; heartburn[  ];   GU: kidney stones [  ]; hematuria[  ];   dysuria [  ];  nocturia[  ]; incontinence [  ];             Skin: rash, swelling[  ];, hair loss[  ];  peripheral edema[  ];  or itching[  ]; Musculosketetal: myalgias[  ];  joint swelling[  ];  joint erythema[  ];  joint pain[ y ];  back pain[  ];  Heme/Lymph: bruising[  ];  bleeding[  ];  anemia[  ];  Neuro: TIA[  ];  headaches[  ];  stroke[  ];  vertigo[  ];  seizures[  ];   paresthesias[  ];  difficulty walking[   ];  Psych:depression[  ]; anxiety[  ];  Endocrine: diabetes[  ];  thyroid dysfunction[  ];  Other:  Past Medical History  Diagnosis Date  . HIV (human immunodeficiency virus infection) (HCC)   . Hepatitis C   . Hypertension   . Diabetes mellitus without complication (HCC)     diagnosed 04-2015    Prior to Admission medications   Medication Sig Start Date End Date Taking? Authorizing Provider  atenolol (TENORMIN) 100 MG tablet TAKE ONE TABLET BY MOUTH ONCE DAILY 05/31/15  Yes Jeffrey   Chevis Pretty, MD  blood glucose meter kit and supplies KIT Dispense based on patient and insurance preference. Use up to four times daily as directed. (FOR ICD-9 250.00, 250.01). 08/03/15  Yes Ejiroghene E Emokpae, MD  diclofenac sodium (VOLTAREN) 1 % GEL Apply 4 g topically 4 (four) times daily. 11/03/15  Yes Liberty Handy, MD  Elbasvir-Grazoprevir (ZEPATIER) 50-100 MG TABS Take 1 tablet by mouth daily. 08/02/15  Yes Campbell Riches, MD  emtricitabine-tenofovir AF (DESCOVY) 200-25 MG tablet Take 1 tablet by mouth daily. 08/03/15  Yes Campbell Riches, MD  glipiZIDE (GLUCOTROL) 5 MG tablet Take 1 tablet (5 mg total) by mouth daily before breakfast. 08/27/15  Yes Collier Salina, MD  HYDROcodone-acetaminophen (NORCO/VICODIN) 5-325 MG tablet Take 2 tablets by mouth every 4 (four) hours as needed. 11/13/15  Yes Hollace Kinnier Sofia, PA-C  indomethacin (INDOCIN) 25 MG capsule Take 1 capsule (25 mg total) by mouth 3 (three) times daily as needed. 11/13/15  Yes Hollace Kinnier Sofia, PA-C  lisinopril-hydrochlorothiazide (PRINZIDE,ZESTORETIC) 20-12.5 MG tablet Take 2 tablets by mouth daily. 08/27/15  Yes Collier Salina, MD  metFORMIN (GLUCOPHAGE) 1000 MG tablet TAKE 1 TABLET BY MOUTH TWICE DAILY WITH BREAKFAST AND DINNER 10/25/15  Yes Collier Salina, MD  raltegravir (ISENTRESS) 400 MG tablet Take 1 tablet (400 mg total) by mouth 2 (two) times daily. 08/03/15  Yes Campbell Riches, MD  ribavirin (REBETOL) 200 MG capsule Take 3  capsules (600 mg total) by mouth 2 (two) times daily with a meal. 08/02/15  Yes Campbell Riches, MD  Sofosbuvir (SOVALDI) 400 MG TABS Take 1 tablet by mouth daily. 08/02/15  Yes Campbell Riches, MD     Infusions:   No Known Allergies  Social History   Social History  . Marital Status: Single    Spouse Name: N/A  . Number of Children: N/A  . Years of Education: N/A   Occupational History  . Not on file.   Social History Main Topics  . Smoking status: Former Smoker -- 0.30 packs/day for 30 years    Types: Cigarettes    Start date: 07/20/2012    Quit date: 02/17/2014  . Smokeless tobacco: Never Used  . Alcohol Use: 0.6 - 1.2 oz/week    1-2 Cans of beer, 0 Standard drinks or equivalent per week     Comment: occasional beer.  . Drug Use: No  . Sexual Activity: Not on file     Comment: pt. given condoms   Other Topics Concern  . Not on file   Social History Narrative    Family History  Problem Relation Age of Onset  . Diabetes Mother   . Cancer Mother     unkown type  . Hypertension Father     PHYSICAL EXAM: Filed Vitals:   11/17/15 1236  BP: 112/60  Pulse: 75    No intake or output data in the 24 hours ending 11/17/15 1625  General:  Well appearing. No respiratory difficulty HEENT: normal Neck: supple. no JVD. Carotids 2+ bilat; no bruits. No lymphadenopathy or thryomegaly appreciated. Cor: PMI nondisplaced. Regular rate & rhythm. +S4 no murmur Lungs: clear Abdomen: soft, nontender, nondistended. No hepatosplenomegaly. No bruits or masses. Good bowel sounds. Extremities: no cyanosis, clubbing, rash, edema Neuro: alert & oriented x 3, cranial nerves grossly intact. moves all 4 extremities w/o difficulty. Affect pleasant.  ECG: NSR Mild j-point elevation and u-waves. No ST-T wave abnormalities.    Results for orders placed or performed during  the hospital encounter of 11/17/15 (from the past 24 hour(s))  Comprehensive Metabolic Panel (CMET)     Status:  Abnormal   Collection Time: 11/17/15  2:09 PM  Result Value Ref Range   Sodium 139 135 - 145 mmol/L   Potassium 4.2 3.5 - 5.1 mmol/L   Chloride 105 101 - 111 mmol/L   CO2 24 22 - 32 mmol/L   Glucose, Bld 80 65 - 99 mg/dL   BUN 29 (H) 6 - 20 mg/dL   Creatinine, Ser 1.33 (H) 0.61 - 1.24 mg/dL   Calcium 10.1 8.9 - 10.3 mg/dL   Total Protein 7.6 6.5 - 8.1 g/dL   Albumin 3.8 3.5 - 5.0 g/dL   AST 27 15 - 41 U/L   ALT 24 17 - 63 U/L   Alkaline Phosphatase 78 38 - 126 U/L   Total Bilirubin 0.9 0.3 - 1.2 mg/dL   GFR calc non Af Amer 58 (L) >60 mL/min   GFR calc Af Amer >60 >60 mL/min   Anion gap 10 5 - 15  CBC     Status: Abnormal   Collection Time: 11/17/15  2:09 PM  Result Value Ref Range   WBC 5.4 4.0 - 10.5 K/uL   RBC 3.06 (L) 4.22 - 5.81 MIL/uL   Hemoglobin 9.3 (L) 13.0 - 17.0 g/dL   HCT 31.0 (L) 39.0 - 52.0 %   MCV 101.3 (H) 78.0 - 100.0 fL   MCH 30.4 26.0 - 34.0 pg   MCHC 30.0 30.0 - 36.0 g/dL   RDW 14.9 11.5 - 15.5 %   Platelets 431 (H) 150 - 400 K/uL  INR/PT     Status: None   Collection Time: 11/17/15  2:09 PM  Result Value Ref Range   Prothrombin Time 15.1 11.6 - 15.2 seconds   INR 1.17 0.00 - 1.49   No results found.   ASSESSMENT: 1. Prgressive exertional angina 2. CAD s/p previous coronary stent details unknown 3. DM2 4. HTN 5. HL 6. HIV 7. HCV  PLAN/DISCUSSION:  He has progressive exertional angina. Will need cath this week. Start ecasa 81. I gave his prescription for prn NTG. Call 911 if develops severe CP. I discussed statin therapy with Dr. Johnnye Sima. Ok to use atorva 20.   I have reviewed the risks, indications, and alternatives to angioplasty and stenting with the patient. Risks include but are not limited to bleeding, infection, vascular injury, stroke, myocardial infection, arrhythmia, kidney injury, radiation-related injury in the case of prolonged fluoroscopy use, emergency cardiac surgery, and death. The patient understands the risks of serious  complication is low (<1%) and he agrees to proceed.   John Deroy,MD 4:25 PM

## 2015-11-19 NOTE — Interval H&P Note (Signed)
History and Physical Interval Note:  11/19/2015 8:19 AM  John Dickerson  has presented today for surgery, with the diagnosis of exertional angina The various methods of treatment have been discussed with the patient and family. After consideration of risks, benefits and other options for treatment, the patient has consented to  Procedure(s): Left Heart Cath and Coronary Angiography (N/A) and possible angioplasty as a surgical intervention .  The patient's history has been reviewed, patient examined, no change in status, stable for surgery.  I have reviewed the patient's chart and labs.  Questions were answered to the patient's satisfaction.     Bensimhon, Quillian Quince

## 2015-11-19 NOTE — Progress Notes (Signed)
Site area: rt groin Site Prior to Removal:  Level 0 Pressure Applied For:  20 minutes Manual:   yes Patient Status During Pull:  stable Post Pull Site:  Level  0 Post Pull Instructions Given:  yes Post Pull Pulses Present: yes  Dressing Applied:  tegaderm Bedrest begins @  1450 Comments:

## 2015-11-19 NOTE — Care Management Note (Signed)
Case Management Note  Patient Details  Name: SHAUL LARMON MRN: [id number] Date of Birth: 09/27/1960  Subjective/Objective:  Patient is from home s/p intervention, will be on plavix, NCM will cont to follow for dc needs.                   Action/Plan:   Expected Discharge Date:                  Expected Discharge Plan:  Home/Self Care  In-House Referral:     Discharge planning Services  CM Consult  Post Acute Care Choice:    Choice offered to:     DME Arranged:    DME Agency:     HH Arranged:    HH Agency:     Status of Service:  In process, will continue to follow  Medicare Important Message Given:    Date Medicare IM Given:    Medicare IM give by:    Date Additional Medicare IM Given:    Additional Medicare Important Message give by:     If discussed at [place] of Stay Meetings, dates discussed:    Additional Comments:  Zenon Mayo, RN 11/19/2015, 6:13 PM

## 2015-11-20 ENCOUNTER — Encounter (HOSPITAL_COMMUNITY): Payer: Self-pay | Admitting: Cardiology

## 2015-11-20 DIAGNOSIS — I25119 Atherosclerotic heart disease of native coronary artery with unspecified angina pectoris: Secondary | ICD-10-CM | POA: Diagnosis not present

## 2015-11-20 DIAGNOSIS — R079 Chest pain, unspecified: Secondary | ICD-10-CM | POA: Diagnosis not present

## 2015-11-20 DIAGNOSIS — I10 Essential (primary) hypertension: Secondary | ICD-10-CM | POA: Diagnosis not present

## 2015-11-20 DIAGNOSIS — I2510 Atherosclerotic heart disease of native coronary artery without angina pectoris: Secondary | ICD-10-CM | POA: Diagnosis not present

## 2015-11-20 DIAGNOSIS — Z95820 Peripheral vascular angioplasty status with implants and grafts: Secondary | ICD-10-CM

## 2015-11-20 DIAGNOSIS — B20 Human immunodeficiency virus [HIV] disease: Secondary | ICD-10-CM | POA: Diagnosis not present

## 2015-11-20 DIAGNOSIS — T82858A Stenosis of vascular prosthetic devices, implants and grafts, initial encounter: Secondary | ICD-10-CM | POA: Diagnosis not present

## 2015-11-20 DIAGNOSIS — E119 Type 2 diabetes mellitus without complications: Secondary | ICD-10-CM | POA: Diagnosis not present

## 2015-11-20 DIAGNOSIS — B1920 Unspecified viral hepatitis C without hepatic coma: Secondary | ICD-10-CM | POA: Diagnosis not present

## 2015-11-20 LAB — BASIC METABOLIC PANEL
ANION GAP: 10 (ref 5–15)
BUN: 25 mg/dL — ABNORMAL HIGH (ref 6–20)
CALCIUM: 9.8 mg/dL (ref 8.9–10.3)
CO2: 24 mmol/L (ref 22–32)
CREATININE: 1.1 mg/dL (ref 0.61–1.24)
Chloride: 103 mmol/L (ref 101–111)
GFR calc Af Amer: >60 mL/min (ref >60)
GLUCOSE: 112 mg/dL — AB (ref 65–99)
Potassium: 4 mmol/L (ref 3.5–5.1)
Sodium: 137 mmol/L (ref 135–145)

## 2015-11-20 LAB — CBC
HCT: 30.4 % — ABNORMAL LOW (ref 39.0–52.0)
HEMOGLOBIN: 9.1 g/dL — AB (ref 13.0–17.0)
MCH: 30.2 pg (ref 26.0–34.0)
MCHC: 29.9 g/dL — AB (ref 30.0–36.0)
MCV: 101 fL — ABNORMAL HIGH (ref 78.0–100.0)
PLATELETS: 450 10*3/uL — AB (ref 150–400)
RBC: 3.01 MIL/uL — ABNORMAL LOW (ref 4.22–5.81)
RDW: 15 % (ref 11.5–15.5)
WBC: 5.5 10*3/uL (ref 4.0–10.5)

## 2015-11-20 LAB — GLUCOSE, CAPILLARY: Glucose-Capillary: 131 mg/dL — ABNORMAL HIGH (ref 65–99)

## 2015-11-20 MED ORDER — ACETAMINOPHEN 325 MG PO TABS: 650.0000 mg | ORAL_TABLET | ORAL | Status: DC | PRN

## 2015-11-20 MED ORDER — ATORVASTATIN CALCIUM 20 MG PO TABS: 20.0000 mg | ORAL_TABLET | Freq: Every day | ORAL | Status: DC

## 2015-11-20 MED ORDER — CLOPIDOGREL BISULFATE 75 MG PO TABS: 75.0000 mg | ORAL_TABLET | Freq: Every day | ORAL | Status: DC

## 2015-11-20 NOTE — Progress Notes (Signed)
SUBJECTIVE:  No complaints  OBJECTIVE:   Vitals:   Filed Vitals:   11/19/15 1913 11/19/15 2000 11/20/15 0359 11/20/15 0739  BP: 137/68 123/63 156/81 139/72  Pulse: 85 72 89 68  Temp: 98 F (36.7 C)  97.1 F (36.2 C) 98.5 F (36.9 C)  TempSrc: Oral  Oral Oral  Resp: 18 19 12 23   Height:      Weight:      SpO2: 100% 100% 100% 100%   I&O's:   Intake/Output Summary (Last 24 hours) at 11/20/15 [id number] Last data filed at 11/20/15 0700  Gross per 24 hour  Intake 1226.67 ml  Output   1000 ml  Net 226.67 ml   TELEMETRY: Reviewed telemetry pt in NSR:     PHYSICAL EXAM General: Well developed, well nourished, in no acute distress Head: Eyes PERRLA, No xanthomas.   Normal cephalic and atramatic  Lungs:   Clear bilaterally to auscultation and percussion. Heart:   HRRR S1 S2 Pulses are 2+ & equal. Abdomen: Bowel sounds are positive, abdomen soft and non-tender without masses  Extremities:   No clubbing, cyanosis or edema.  DP +1 Neuro: Alert and oriented X 3. Psych:  Good affect, responds appropriately   LABS: Basic Metabolic Panel:  Recent Labs  11/17/15 1409 11/19/15 1638 11/20/15 0355  NA 139  --  137  K 4.2  --  4.0  CL 105  --  103  CO2 24  --  24  GLUCOSE 80  --  112*  BUN 29*  --  25*  CREATININE 1.33* 1.11 1.10  CALCIUM 10.1  --  9.8   Liver Function Tests:  Recent Labs  11/17/15 1409  AST 27  ALT 24  ALKPHOS 78  BILITOT 0.9  PROT 7.6  ALBUMIN 3.8   No results for input(s): LIPASE, AMYLASE in the last 72 hours. CBC:  Recent Labs  11/19/15 1638 11/20/15 0355  WBC 5.9 5.5  HGB 9.4* 9.1*  HCT 30.9* 30.4*  MCV 101.6* 101.0*  PLT 378 450*   Cardiac Enzymes: No results for input(s): CKTOTAL, CKMB, CKMBINDEX, TROPONINI in the last 72 hours. BNP: Invalid input(s): POCBNP D-Dimer: No results for input(s): DDIMER in the last 72 hours. Hemoglobin A1C: No results for input(s): HGBA1C in the last 72 hours. Fasting Lipid Panel: No results for  input(s): CHOL, HDL, LDLCALC, TRIG, CHOLHDL, LDLDIRECT in the last 72 hours. Thyroid Function Tests: No results for input(s): TSH, T4TOTAL, T3FREE, THYROIDAB in the last 72 hours.  Invalid input(s): FREET3 Anemia Panel: No results for input(s): VITAMINB12, FOLATE, FERRITIN, TIBC, IRON, RETICCTPCT in the last 72 hours. Coag Panel:   Lab Results  Component Value Date   INR 1.17 11/17/2015   INR 0.94 05/12/2014    RADIOLOGY: Dg Foot Complete Left  11/13/2015  CLINICAL DATA:  55 year old male with anterior foot pain and swelling for the past day. EXAM: LEFT FOOT - COMPLETE 3+ VIEW COMPARISON:  No priors. FINDINGS: Bunion deformity. Along the medial aspect of the head of the first metatarsal there is an erosion with overhanging edges, and superficial soft tissue swelling, highly suspicious for gout. No acute displaced fracture or dislocation. IMPRESSION: 1. Findings suggest gout involving the medial aspect of the distal first metatarsal. 2. Bunion deformity. Electronically Signed   By: Vinnie Langton M.D.   On: 11/13/2015 08:16   Dg Foot Complete Right  10/29/2015  CLINICAL DATA:  Gout pain on the right. EXAM: RIGHT FOOT COMPLETE - 3+ VIEW COMPARISON:  None. FINDINGS: Hallux valgus with bony bunion. There is superimposed extra-articular well-defined cystic change medially. Overlying soft tissue swelling and faint soft tissue mineralization. Periosteal reaction and subtle sclerotic line along the third metatarsal neck. IMPRESSION: 1. Gouty changes at the first MTP joint. 2. Healing third metatarsal neck fracture, nondisplaced. 3. Hallux valgus and bunion. Electronically Signed   By: Monte Fantasia M.D.   On: 10/29/2015 14:05    ASSESSMENT/PLAN: 1. Prgressive exertional angina secondary to high grade LAD stenosis.  Cath showed 50% ramus, 80% OM1, 70% ostial LPDA, 80% mid LAD and 70% distal LAD lesions s/p DES x 2 with normal LVF.  Now on DAPT.  No further CP.  Continue ASA/Plavix/statin/BB. No  statin due to interaction with HIV meds. 2. CAD s/p previous coronary stent details unknown.  See above. 3. DM2- continue oral hypoglycemic.   4. HTN - BP adequately controlled on BB. 5. HL - no statin therapy due to interaction with HIV meds. 6. HIV - per ID 7. HCV - per ID  Patient is ambulating without angina and stable for discharge.  Will have TOC followup in 2 weeks.    Fransico Him, MD  11/20/2015  9:27 AM

## 2015-11-20 NOTE — Progress Notes (Signed)
CARDIAC REHAB PHASE I   PRE:  Rate/Rhythm: 67 sinus rhythm  BP:  Supine:   Sitting: 138/74  Standing:    SaO2: 100% ra   MODE:  Ambulation: 500 ft   POST:  Rate/Rhythem: 75 sinus rhythm  BP:  Supine:   Sitting: 106/72  Standing:    SaO2: 995 ra   0830-0859 Pt ambulated in hallway, steady gait.  Returned to bed, call light in reach.  Education completed including risk factor modification, low fat-low cholesterol diet, exercise, and medication compliance.  Pt oriented to outpatient cardiac rehab.  At pt request, referral will be sent to [facility].  Understanding verbalized   [employer]

## 2015-11-20 NOTE — Discharge Summary (Signed)
Physician Discharge Summary       Patient ID: EEAN BUSS MRN: [id number] DOB/AGE: Aug 09, 1960 55 y.o.  Admit date: 11/19/2015 Discharge date: 11/20/2015 Primary Cardiologist:Dr. Bensimhon/ [place]   Discharge Diagnoses:  Principal Problem:   Exertional angina [facility]) Active Problems:   S/P angioplasty with stent, 11/19/15 DES X 2 mLAD and dLAD, normal EF    Human immunodeficiency virus (HIV) disease ([place])   Essential hypertension   Diabetes ([place])   Hyperlipemia   Coronary artery disease involving native coronary artery of native heart   HTN (hypertension) essential  Discharged Condition: good  Procedures:  Studies: 11/19/15 cardiac cath by Dr. Haroldine Laws   Left Heart Cath and Coronary Angiography    Conclusion     The left ventricular systolic function is normal.  Ramus lesion, 50% stenosed.  1st Mrg lesion, 80% stenosed.  Ost LPDA to LPDA lesion, 70% stenosed.  Prox LAD to Mid LAD lesion, 80% stenosed. The lesion was previously treated with a stent (unknown type).  Dist LAD lesion, 80% stenosed.  Mid LAD to Dist LAD lesion, 50% stenosed.  Assessment:  1) 2v CAD with high-grade in-stent restenosis in prox LAD and 80% focal lesion in mLAD 2) OM-1 small with diffuse 80%.  3) Normal LV function 4) Probable non-dominant RCA - able to cannulate only transiently despite extensive efforts.   Plan:  PCI of LAD with Dr. Irish Lack. Aggressive risk factor management. Start atorva 20.      11/19/15 PCI by Dr. Irish Lack   Procedures    Coronary Stent Intervention    Conclusion     Mid LAD lesion, 80% stenosed. Post intervention with a 3.0 x 24 Synergy drug-eluting stent, postdilated to greater than 3.5 mm , there is a 5% residual stenosis. The lesion was previously treated with a stent (unknown type).  Dist LAD lesion, 70% stenosed. Post intervention with a 2.5 x 24 Synergy, there is a 0% residual stenosis.     Indications    Coronary artery disease  involving native coronary artery of native heart with angina pectoris [facility]) [facility] Course:  55 y/o male with HIV, HCV, recently diagnosed DM2, HL, former smoker and CAD with previous coronary stent. Referred by Dr. Johnnye Sima to Dr. Haroldine Laws for further evaluation of CP.   Stated he got a stent about 20 years ago here but doesn't remember the details.  A few months ago developed exertional angina. It has been progressive. He complained of chest pain now gets tight every time he walks. Stops and it goes away. Now happening after walking a block. No rest angina. No bleeding.   Taking HIV and HCV meds regularly. Statin stopped due to interaction with HIV meds.   He has progressive exertional angina. He was placed on ASA and SL NTG prn with plans for cardiac cath.  Cardiac cath as above with PCI.  Pt tolerated procedure without complications.  Today he is stable and seen and evaluated by Dr. Radford Pax and found stable for discharge.    He is on BB, statin, ASA and Plavix, he is also on ACE.  He ambulated with cardiac rehab and will follow up with rehab.  We will schedule appt. within 2 weeks.    Consults: None  Significant Diagnostic labs: BMP Latest Ref Rng 11/20/2015 11/19/2015 11/17/2015  Glucose 65 - 99 mg/dL 112(H) - 80  BUN 6 - 20 mg/dL 25(H) - 29(H)  Creatinine 0.61 - 1.24 mg/dL 1.10 1.11 1.33(H)  BUN/Creat Ratio  9 - 20 - - -  Sodium 135 - 145 mmol/L 137 - 139  Potassium 3.5 - 5.1 mmol/L 4.0 - 4.2  Chloride 101 - 111 mmol/L 103 - 105  CO2 22 - 32 mmol/L 24 - 24  Calcium 8.9 - 10.3 mg/dL 9.8 - 10.1   CBC Latest Ref Rng 11/20/2015 11/19/2015 11/17/2015  WBC 4.0 - 10.5 K/uL 5.5 5.9 5.4  Hemoglobin 13.0 - 17.0 g/dL 9.1(L) 9.4(L) 9.3(L)  Hematocrit 39.0 - 52.0 % 30.4(L) 30.9(L) 31.0(L)  Platelets 150 - 400 K/uL 450(H) 378 431(H)   EKG SR with Q waves V1-3 no acute changes.     Discharge Exam: Blood pressure 139/72, pulse 68, temperature 98.5 F (36.9 C), temperature source  Oral, resp. rate 23, height '5\' 4"'  (1.626 m), weight 179 lb (81.194 kg), SpO2 100 %.  Disposition: 01-Home or Self Care      Discharge Instructions    Amb Referral to Cardiac Rehabilitation    Complete by:  As directed   Diagnosis:  Coronary Stents            Medication List    STOP taking these medications        indomethacin 25 MG capsule  Commonly known as:  INDOCIN      TAKE these medications        acetaminophen 325 MG tablet  Commonly known as:  TYLENOL  Take 2 tablets (650 mg total) by mouth every 4 (four) hours as needed for headache or mild pain.     aspirin EC 81 MG tablet  Take 1 tablet (81 mg total) by mouth daily.  Notes to Patient:  Prevents clotting in stent and heart attack     atenolol 100 MG tablet  Commonly known as:  TENORMIN  TAKE ONE TABLET BY MOUTH ONCE DAILY  Notes to Patient:  Decreases work of the heart Decreases heart rate and blood pressure     atorvastatin 20 MG tablet  Commonly known as:  LIPITOR  Take 1 tablet (20 mg total) by mouth daily.  Notes to Patient:  **NEW** Cholesterol      blood glucose meter kit and supplies Kit  Dispense based on patient and insurance preference. Use up to four times daily as directed. (FOR ICD-9 250.00, 250.01).     clopidogrel 75 MG tablet  Commonly known as:  PLAVIX  Take 1 tablet (75 mg total) by mouth daily with breakfast.  Notes to Patient:  ** NEW** Prevents clotting in stent and heart attack STOP Indocin, taking with clopidogrel will increase bleeding risk     diclofenac sodium 1 % Gel  Commonly known as:  VOLTAREN  Apply 4 g topically 4 (four) times daily.     Elbasvir-Grazoprevir 50-100 MG Tabs  Commonly known as:  ZEPATIER  Take 1 tablet by mouth daily.     emtricitabine-tenofovir AF 200-25 MG tablet  Commonly known as:  DESCOVY  Take 1 tablet by mouth daily.     glipiZIDE 5 MG tablet  Commonly known as:  GLUCOTROL  Take 1 tablet (5 mg total) by mouth daily before breakfast.    Notes to Patient:  Diabetes-sugar     HYDROcodone-acetaminophen 5-325 MG tablet  Commonly known as:  NORCO/VICODIN  Take 2 tablets by mouth every 4 (four) hours as needed.     lisinopril-hydrochlorothiazide 20-12.5 MG tablet  Commonly known as:  PRINZIDE,ZESTORETIC  Take 2 tablets by mouth daily.  Notes to Patient:  Blood pressure  metFORMIN 1000 MG tablet  Commonly known as:  GLUCOPHAGE  TAKE 1 TABLET BY MOUTH TWICE DAILY WITH BREAKFAST AND DINNER  Notes to Patient:  Diabetes-sugar  DO NOT TAKE TODAY OR TOMORROW RESTART Monday 6/5    Can cause kidney problems if taken sooner     nitroGLYCERIN 0.4 MG SL tablet  Commonly known as:  NITROSTAT  Place 1 tablet (0.4 mg total) under the tongue every 5 (five) minutes as needed for chest pain.     raltegravir 400 MG tablet  Commonly known as:  ISENTRESS  Take 1 tablet (400 mg total) by mouth 2 (two) times daily.     ribavirin 200 MG capsule  Commonly known as:  REBETOL  Take 3 capsules (600 mg total) by mouth 2 (two) times daily with a meal.     Sofosbuvir 400 MG Tabs  Commonly known as:  SOVALDI  Take 1 tablet by mouth daily.       Follow-up Information    Follow up with Larae Grooms, MD.   Specialties:  Cardiology, Radiology, Interventional Cardiology   Why:  our office will call you on Monday or Tuesday with appt date and time.      Contact information:   [address] [place] [address] [phone]        Discharge Instructions: DO NOT TAKE METFORMIN UNTIL 11/22/15 IT MAY INTERACT WITH CATH DYE   Heart Healthy Diabetic Diet  Call [place] at [phone] if any bleeding, swelling or drainage at cath site.  May shower, no tub baths for 48 hours for groin sticks. No lifting over 5 pounds for 3 days.  No Driving for 3 days  No work for 5 days.   Call if any problems  Do not stop asprin and plavix, stopping could cause a heart attack.    Our office will call  you Monday or Tuesday to schedule an appt.       Signed: Cecilie Kicks Nurse Practitioner-Certified [facility]: [facility] 11/20/2015, 12:09 PM  Time spent on discharge : 30 minutes.

## 2015-11-20 NOTE — Discharge Instructions (Signed)
DO NOT TAKE METFORMIN UNTIL 11/22/15 IT MAY INTERACT WITH CATH DYE   Heart Healthy Diabetic Diet  Call [place] at [phone] if any bleeding, swelling or drainage at cath site.  May shower, no tub baths for 48 hours for groin sticks. No lifting over 5 pounds for 3 days.  No Driving for 3 days  No work for 5 days.   Call if any problems  Do not stop asprin and plavix, stopping could cause a heart attack.    Our office will call you Monday or Tuesday to schedule an appt.

## 2015-11-22 ENCOUNTER — Other Ambulatory Visit: Payer: Self-pay | Admitting: Infectious Diseases

## 2015-11-22 MED FILL — Heparin Sodium (Porcine) 2 Unit/ML in Sodium Chloride 0.9%: INTRAMUSCULAR | Qty: 1000 | Status: AC

## 2015-11-23 ENCOUNTER — Encounter: Payer: Self-pay | Admitting: Interventional Cardiology

## 2015-11-24 ENCOUNTER — Ambulatory Visit: Payer: Medicare Other | Admitting: Dietician

## 2015-11-24 ENCOUNTER — Encounter: Payer: Self-pay | Admitting: Infectious Diseases

## 2015-11-25 ENCOUNTER — Other Ambulatory Visit: Payer: Self-pay | Admitting: Internal Medicine

## 2015-11-25 NOTE — Telephone Encounter (Signed)
Needs Aug appt Dr Benjamine Mola

## 2015-11-26 ENCOUNTER — Encounter: Payer: Self-pay | Admitting: Dietician

## 2015-11-26 ENCOUNTER — Encounter: Payer: Self-pay | Admitting: Gastroenterology

## 2015-11-26 ENCOUNTER — Ambulatory Visit (INDEPENDENT_AMBULATORY_CARE_PROVIDER_SITE_OTHER): Payer: Medicare Other | Admitting: Dietician

## 2015-11-26 ENCOUNTER — Other Ambulatory Visit: Payer: Self-pay | Admitting: Internal Medicine

## 2015-11-26 VITALS — Wt 181.1 lb

## 2015-11-26 DIAGNOSIS — Z0000 Encounter for general adult medical examination without abnormal findings: Secondary | ICD-10-CM

## 2015-11-26 DIAGNOSIS — E119 Type 2 diabetes mellitus without complications: Secondary | ICD-10-CM | POA: Diagnosis not present

## 2015-11-26 NOTE — Telephone Encounter (Signed)
Error

## 2015-11-26 NOTE — Patient Instructions (Addendum)
Call today: Follow-up Information    Follow up with Larae Grooms, MD.   Specialties: Cardiology, Radiology, Interventional Cardiology   Why: our office will call you on Monday or Tuesday with appt date and time.    Contact information:   [id number] [place] [address] [phone]        You are doing wonderful at caring for your diabetes.  You plan to keep up the great work is to continue eating healthy, being active and call when you have questions or concerns.  Can also go to a diabetes support group meeting.

## 2015-11-26 NOTE — Progress Notes (Signed)
Diabetes Self-Management Education  Visit Type:  Follow-up  Appt. Start Time: 0945 Appt. End Time: [id number]  11/26/2015  [name], identified by name and date of birth, is a 55 y.o. male with a diagnosis of Diabetes:  [name]  Type 2 diabetes ASSESSMENT Dx: 2016  Weight 181 lb 1.6 oz (82.146 kg). Body mass index is 31.07 kg/(m^2).       Diabetes Self-Management Education - 11/26/15 1000    Health Coping   How would you rate your overall health? Good   Psychosocial Assessment   Patient Belief/Attitude about Diabetes Motivated to manage diabetes   Self-management support Doctor's office;Family;CDE visits   Patient Concerns Healthy Lifestyle   Special Needs None   Preferred Learning Style No preference indicated   Learning Readiness Ready   Pre-Education Assessment   Patient understands the diabetes disease and treatment process. Needs Instruction   Patient understands incorporating nutritional management into lifestyle. Demonstrates understanding / competency   Patient undertands incorporating physical activity into lifestyle. Demonstrates understanding / competency   Patient understands using medications safely. Needs Review   Patient understands monitoring blood glucose, interpreting and using results Needs Review   Patient understands prevention, detection, and treatment of acute complications. Demonstrates understanding / competency   Patient understands prevention, detection, and treatment of chronic complications. Demonstrates understanding / competency   Patient understands how to develop strategies to address psychosocial issues. Demonstrates understanding / competency   Patient understands how to develop strategies to promote health/change behavior. Demonstrates understanding / competency   Complications   Last HgB A1C per patient/outside source 4.8 %   How often do you check your blood sugar? 1-2 times/day   Fasting Blood glucose range (mg/dL) 70-129   Number of  hypoglycemic episodes per month --  0   Number of hyperglycemic episodes per week 0   Have you had a dilated eye exam in the past 12 months? Yes   Exercise   Exercise Type Light (walking / raking leaves)   How many days per week to you exercise? 30   How many minutes per day do you exercise? 5   Total minutes per week of exercise 150   Patient Education   Previous Diabetes Education Yes (please comment)   Disease state  Definition of diabetes, type 1 and 2, and the diagnosis of diabetes;Factors that contribute to the development of diabetes   Medications Reviewed patients medication for diabetes, action, purpose, timing of dose and side effects.   Monitoring Identified appropriate SMBG and/or A1C goals.   Personal strategies to promote health Helped patient develop diabetes management plan for (enter comment)   Individualized Goals (developed by patient)   Nutrition General guidelines for healthy choices and portions discussed   Patient Self-Evaluation of Goals - Patient rates self as meeting previously set goals (% of time)   Nutrition >75%   Outcomes   Program Status Completed   Subsequent Visit   Since your last visit have you continued or begun to take your medications as prescribed? Yes   Since your last visit have you had your blood pressure checked? Yes   Is your most recent blood pressure lower, unchanged, or higher since your last visit? Lower   Since your last visit have you experienced any weight changes? No change   Since your last visit, are you checking your blood glucose at least once a day? Yes      Learning Objective:  Patient will have a greater understanding of diabetes self-management.  Patient individualized diabetes plan discussed today with patient and includes: what is Diabetes, Nutrition, medications, monitoring, physical activity, how to handle highs and lows, Special care for my body when I have diabetes, Dealing daily with diabetes. Topics left today  [id number] is Diabetes, medications and Dealing daily with diabetes/support plan My plan to support myself in continuing these changes to care for my diabetes is to attend or contact:   Diabetes Support Groups  Type 2 diabetes support group : 2nd Monday of every month from 6-7 PM at [address] [facility] conference room [phone] ? Add other local support resources -doctor's office, CDE, Dietitian, pharmacist, church  Plan:   Patient Instructions   Call today: Follow-up Information    Follow up with Larae Grooms, MD.   Specialties: Cardiology, Radiology, Interventional Cardiology   Why: our office will call you on Monday or Tuesday with appt date and time.    Contact information:   [id number] [place] [address] [phone]        You are doing wonderful at caring for your diabetes.  You plan to keep up the great work is to continue eating healthy, being active and call when you have questions or concerns.  Can also go to a diabetes support group meeting.   Expected Outcomes:  Demonstrated interest in learning. Expect positive outcomes Education material provided: A1C conversion sheet If problems or questions, patient to contact team via:  Phone Future DSME appointment: - 3-4 months

## 2015-12-14 ENCOUNTER — Encounter: Payer: Self-pay | Admitting: Infectious Diseases

## 2015-12-16 ENCOUNTER — Telehealth: Payer: Self-pay

## 2015-12-16 NOTE — Telephone Encounter (Signed)
Patient is calling due to increased gout flair ups. He is not sure if this is elevated by his Hep C treatment which was completed on 12-02-15. Two flare ups within two first was bilateral ankles and now bilateral wrist.  He is requesting office visit.    Explained to patient this is not related to Hepatitis C medications and he will need an evaluation through his primary care office.  He was advised to call today and given phone number .  Laverle Patter, RN

## 2015-12-20 ENCOUNTER — Other Ambulatory Visit: Payer: Self-pay | Admitting: Dietician

## 2015-12-20 ENCOUNTER — Ambulatory Visit (INDEPENDENT_AMBULATORY_CARE_PROVIDER_SITE_OTHER): Payer: Medicare Other | Admitting: Internal Medicine

## 2015-12-20 ENCOUNTER — Encounter: Payer: Self-pay | Admitting: Internal Medicine

## 2015-12-20 VITALS — BP 136/80 | HR 76 | Temp 98.7°F | Wt 168.1 lb

## 2015-12-20 DIAGNOSIS — E119 Type 2 diabetes mellitus without complications: Secondary | ICD-10-CM

## 2015-12-20 DIAGNOSIS — M1000 Idiopathic gout, unspecified site: Secondary | ICD-10-CM | POA: Insufficient documentation

## 2015-12-20 NOTE — Telephone Encounter (Signed)
John Dickerson requests a meter that his insurance will cover the strips. Provided him with a One Touch Verio Flex meter today. Request supplies.If his preferred pharmacy does not bill Medicare part B, he may be able to get them from a local retail pharmacy.

## 2015-12-20 NOTE — Assessment & Plan Note (Signed)
A: Patient first started having problems "about 3 months ago".  Seen in [facility] on 5/12, x-ray of right foot showed gouty changes at the first MTP joint and healing 3rd metatarsal neck fracture non-displaced.  Treated with ibuprofen until symptom resolution.  Then seen in the ED on 5/27 and x-ray was suggestive of gout involving the medial aspect of the distal first metatarsal on the left foot.  Given indomethacin which patient states helped.  Reports about 2 weeks ago he experienced worsening and intense pain leaving him unable to walk that has since resolved.  Reports pain involved both feet, wrists, and left elbow.  Described as constant and throbbing but has now resolved.  Denies any fever, dysuria, discharge, new sexual contacts.  He has been decreasing his red meat and EtOH intake recently since reading about exacerbating factors of gout on-line.  P: - patient symptoms have now all but resolved so do not see indication to treat acute flare.  In the future would probably treat with steroids vs NSAIDs given CAD - consider changing BP medications to reduce hyperuricemia associated with HCTZ - check uric acid, ESR, CRP today given not in acute flare - in the future, if he has over 3 attacks per year would consider adding allopurinol with colchicine while initiating allopurinol

## 2015-12-20 NOTE — Patient Instructions (Signed)
Thank you for coming to see me today. It was a pleasure. Today we talked about:   Gout: i am checking some labs so we have a baseline to monitor your gout.  I would like for you to continue diet and lifestyle modifications.  If you have another recurrence of gout, please make an appointment with our clinic to discuss medications for preventing gout flares.  Please follow-up with [place] as needed.  If you have any questions or concerns, please do not hesitate to call the office at [phone].  Take Care,   Jule Ser, DO

## 2015-12-20 NOTE — Progress Notes (Signed)
Patient ID: John Dickerson, male   DOB: 11/07/1960, 55 y.o.   MRN: [id number]   CC: "gout problems" HPI: [name] is a 55 y.o. man with PMHx detailed below presenting for problems with gout.    Please see A&P for status of medical conditions addressed today.    Past Medical History  Diagnosis Date  . HIV (human immunodeficiency virus infection) (HCC)   . Hypertension   . High cholesterol   . Type II diabetes mellitus (HCC)     diagnosed 04-2015  . Hepatitis C     "going thru the treatments" (11/19/2015)  . Arthritis     "feet" (11/19/2015)  . History of gout     "just recently" (11/19/2015)  . S/P angioplasty with stent, 11/19/15 DES X 2 mLAD and dLAD, normal EF  11/20/2015   Review of Systems: Review of Systems  Constitutional: Positive for chills. Negative for fever.  Gastrointestinal: Negative for nausea, vomiting and diarrhea.  Genitourinary: Negative for dysuria.  Musculoskeletal: Positive for joint pain.    Physical Exam: Filed Vitals:   12/20/15 0848  BP: 136/80  Pulse: 76  Temp: 98.7 F (37.1 C)  TempSrc: Oral  Weight: 168 lb 1.6 oz (76.25 kg)  SpO2: 100%   Physical Exam  Constitutional: He is well-developed, well-nourished, and in no distress.  HENT:  Head: Normocephalic and atraumatic.  Pulmonary/Chest: Effort normal.  Musculoskeletal:  No swelling noted of the bilateral MTP joints. No ulnar deviation. Normal range of motion of elbow and wrists bilaterally. Normal strength. + bunion on bilateral feet.  Skin: Skin is warm and dry. No rash noted. No erythema.     Assessment & Plan:  See encounters tab for problem based medical decision making. Patient discussed with Dr. Narendra  Gout A: Patient first started having problems "about 3 months ago".  Seen in [facility] on 5/12, x-ray of right foot showed gouty changes at the first MTP joint and healing 3rd metatarsal neck fracture non-displaced.  Treated with ibuprofen until symptom resolution.   Then seen in the ED on 5/27 and x-ray was suggestive of gout involving the medial aspect of the distal first metatarsal on the left foot.  Given indomethacin which patient states helped.  Reports about 2 weeks ago he experienced worsening and intense pain leaving him unable to walk that has since resolved.  Reports pain involved both feet, wrists, and left elbow.  Described as constant and throbbing but has now resolved.  Denies any fever, dysuria, discharge, new sexual contacts.  He has been decreasing his red meat and EtOH intake recently since reading about exacerbating factors of gout on-line.  P: - patient symptoms have now all but resolved so do not see indication to treat acute flare.  In the future would probably treat with steroids vs NSAIDs given CAD - consider changing BP medications to reduce hyperuricemia associated with HCTZ - check uric acid, ESR, CRP today given not in acute flare - in the future, if he has over 3 attacks per year would consider adding allopurinol with colchicine while initiating allopurinol     

## 2015-12-21 LAB — URIC ACID: Uric Acid: 12 mg/dL — ABNORMAL HIGH (ref 3.7–8.6)

## 2015-12-21 LAB — SEDIMENTATION RATE: Sed Rate: 37 mm/hr — ABNORMAL HIGH (ref 0–30)

## 2015-12-21 LAB — C-REACTIVE PROTEIN: CRP: 7.7 mg/L — ABNORMAL HIGH (ref 0.0–4.9)

## 2015-12-22 MED ORDER — GLUCOSE BLOOD VI STRP: ORAL_STRIP | Status: DC

## 2015-12-22 MED ORDER — ONETOUCH VERIO FLEX SYSTEM W/DEVICE KIT: 1.0000 | PACK | Freq: Every day | Status: DC

## 2015-12-22 MED ORDER — ONETOUCH DELICA LANCETS FINE MISC: Status: AC

## 2015-12-22 NOTE — Progress Notes (Signed)
Internal Medicine Clinic Attending  Case discussed with Dr. Wallace at the time of the visit.  We reviewed the resident's history and exam and pertinent patient test results.  I agree with the assessment, diagnosis, and plan of care documented in the resident's note.  

## 2015-12-23 ENCOUNTER — Telehealth (HOSPITAL_COMMUNITY): Payer: Self-pay | Admitting: *Deleted

## 2015-12-23 NOTE — Telephone Encounter (Signed)
Received cardiac rehab referral. Called office to notify them that he had not been seen in office post stent placement.  Informed that two attempts were made to contact pt and letter sent for pt to please contact office to schedule appt.  Pt contacted by phone.  Pt not aware the office was trying to get in touch with him.  Pt given office number to call and schedule appt.  Pt advised that he would need to be seen in the office for follow up and medicaid form will need to be completed by Dr Irish Lack prior to proceeding with cardiac rehab.  Pt given contact information for rehab to call when appt was made.  Pt agreeable to this. Cherre Huger, BSN

## 2015-12-23 NOTE — Addendum Note (Signed)
Addended by: Truddie Crumble on: 12/23/2015 03:26 PM   Modules accepted: Orders

## 2015-12-24 ENCOUNTER — Other Ambulatory Visit: Payer: Self-pay | Admitting: Internal Medicine

## 2015-12-24 ENCOUNTER — Telehealth: Payer: Self-pay

## 2015-12-24 NOTE — Telephone Encounter (Signed)
Spoke with patient who states he has had three flares of gout in one year and is concerned about this. He's not sure what medicine he is asking for refill. No medications in his list are for gout. Added patient to [facility] schedule Monday morning as Dr. Benjamine Mola does not have any appointments.

## 2015-12-24 NOTE — Telephone Encounter (Signed)
Requesting meds for gout. Please call pt back.

## 2015-12-27 ENCOUNTER — Ambulatory Visit (INDEPENDENT_AMBULATORY_CARE_PROVIDER_SITE_OTHER): Payer: Medicare Other | Admitting: Internal Medicine

## 2015-12-27 VITALS — BP 158/80 | HR 96 | Temp 98.4°F | Ht 64.0 in | Wt 169.4 lb

## 2015-12-27 DIAGNOSIS — L538 Other specified erythematous conditions: Secondary | ICD-10-CM | POA: Diagnosis not present

## 2015-12-27 DIAGNOSIS — M25572 Pain in left ankle and joints of left foot: Secondary | ICD-10-CM | POA: Diagnosis not present

## 2015-12-27 DIAGNOSIS — M10072 Idiopathic gout, left ankle and foot: Secondary | ICD-10-CM | POA: Diagnosis not present

## 2015-12-27 DIAGNOSIS — M79675 Pain in left toe(s): Secondary | ICD-10-CM | POA: Diagnosis not present

## 2015-12-27 DIAGNOSIS — M7989 Other specified soft tissue disorders: Secondary | ICD-10-CM

## 2015-12-27 DIAGNOSIS — I10 Essential (primary) hypertension: Secondary | ICD-10-CM | POA: Diagnosis not present

## 2015-12-27 DIAGNOSIS — M25472 Effusion, left ankle: Secondary | ICD-10-CM | POA: Diagnosis not present

## 2015-12-27 MED ORDER — METHYLPREDNISOLONE 4 MG PO TBPK: ORAL_TABLET | ORAL | Status: DC

## 2015-12-27 MED ORDER — LISINOPRIL 20 MG PO TABS: 20.0000 mg | ORAL_TABLET | Freq: Every day | ORAL | Status: DC

## 2015-12-27 MED ORDER — AMLODIPINE BESYLATE 5 MG PO TABS: 5.0000 mg | ORAL_TABLET | Freq: Every day | ORAL | Status: DC

## 2015-12-27 NOTE — Assessment & Plan Note (Signed)
In the setting of repeated episodes of isolated joint pain and swelling, with elevated uratic acid levels, xray finding suggestive of crystaline arthropathy, and current HCTZ thereapy, clinical suspicion is very high for gout. Current episode of left ankle pain with swelling, warmth and erythema consistent with gout. Will prescribe medrol dose pack for tx empirically and refer to Orthopaedics for aspiration and definitive dx. Also plan to change BP management to lisinopril ALONE (from lisinopril-HCTZ combo) and amlodipine for reduction of uric acid levels.  Will consider adding hypouricemic agent like allopurinol if sx recur, may need long term anti-inflamatory during initiation of hyperuricemic to prevent flare ups. Would consider colchicine over NSAIDs in the setting of  CAD, as long as pt remains off of CYP3A4 inhibitors such as HIV protease inhibitors.

## 2015-12-27 NOTE — Patient Instructions (Addendum)
You symptoms are most likely due to gout. We will treat your pain and swelling with a short course of steroids that your will taper over the next 5 days. You will notice that your blood sugar readings may increase while on the steroids. Please notify us if your blood sugar readings get too high, or you start to experience symptoms of high blood sugar such as changes to your vision, anxiety/tremors, frequent urination, or changes to your mental status.  We will also refer you to the Orthopaedic surgeons at 2:45pm today with Dr. Drema Dallas at [facility]. Arrive at 2.15pm.  He will take a sample from the joint to confirm the presence of gout crystals. In the future, should you have recurrence of your symptoms, we may be able to prescribe a medication to reduce the levels of uric acid (the compound responsible for gout) in your body.  In the mean time, we will change your blood pressure medications to lower your uric acid levels. Instead of taking lisinopril-HCTZ as 1 combination pill, we will prescribe a separate medication with lisinopril and a NEW medication called amlodipine. Take both these medications daily for your blood pressure. STOP taking the lisinopril-HCTZ combination pill.

## 2015-12-27 NOTE — Assessment & Plan Note (Signed)
See discussion of gout above. Change BP management to lisinopril ALONE (from lisinopril-HCTZ combo) and amlodipine for reduction of uric acid levels. F/u HTN at next visit.

## 2015-12-27 NOTE — Progress Notes (Signed)
CC: pain of the left ankle HPI: [name] is a 55 y.o. male with a h/o of HIV, DM, HTN who presents with acute pain of the left ankle.  He reports that sx began Thursday, and continued worsening through Sat, now resolving somewhat but still swollen and tender. Endorses pain in the left ankle. On ibuprofen with mild relief. Was found to have elevated Urate, ESR, and CRP 1 wk ago. H/o foot xrays suggestive of gout in May 2017. These sx are the 3rd similar episode this year, with an episode of pain in left 1st toe MTP and 1 episode of polyarticular pain in bilateral ankles and elbows. No h/o joint aspiration.  Denies recent trauma, fever, chills, h/o recent exposure. HIV currently controlled on antiretrovirals. Endorses diet low in fatty foods and reports reducing alcohol consumption recently.  BP elevated today in spite of reported compliance with current BP regimen. Currently taking lisinopril-HCTZ. Denies sx of kidney stones or urinary sx.  Past Medical History  Diagnosis Date  . HIV (human immunodeficiency virus infection) ([place])   . Hypertension   . High cholesterol   . Type II diabetes mellitus ([place])     diagnosed 04-2015  . Hepatitis C     "going thru the treatments" (11/19/2015)  . Arthritis     "feet" (11/19/2015)  . History of gout     "just recently" (11/19/2015)  . S/P angioplasty with stent, 11/19/15 DES X 2 mLAD and dLAD, normal EF  11/20/2015   Current Outpatient Rx  Name  Route  Sig  Dispense  Refill  . aspirin EC 81 MG tablet   Oral   Take 1 tablet (81 mg total) by mouth daily.   90 tablet   3   . atenolol (TENORMIN) 100 MG tablet      TAKE ONE TABLET BY MOUTH ONCE DAILY   30 tablet   5   . atorvastatin (LIPITOR) 20 MG tablet   Oral   Take 1 tablet (20 mg total) by mouth daily.   30 tablet   6   . blood glucose meter kit and supplies KIT      Dispense based on patient and insurance preference. Use up to four times daily as directed. (FOR ICD-9  250.00, 250.01).   1 each   0   . Blood Glucose Monitoring Suppl ([place]) w/Device KIT   Does not apply   1 each by Does not apply route daily.   1 kit   0   . Elbasvir-Grazoprevir (ZEPATIER) 50-100 MG TABS   Oral   Take 1 tablet by mouth daily.   28 tablet   3   . emtricitabine-tenofovir AF (DESCOVY) 200-25 MG tablet   Oral   Take 1 tablet by mouth daily.   30 tablet   11     Stop Genvoya   . glipiZIDE (GLUCOTROL) 5 MG tablet      TAKE 1 TABLET BY MOUTH EVERY DAY BEFORE BREAKFAST   30 tablet   5   . glucose blood (ONETOUCH VERIO) test strip      Check blood sugar up to 1 time a day   100 each   12     The patient is not insulin requiring, ICD 10 code  ...   . metFORMIN (GLUCOPHAGE) 1000 MG tablet      TAKE 1 TABLET BY MOUTH TWICE DAILY WITH BREAKFAST AND DINNER   180 tablet   0   .  nitroGLYCERIN (NITROSTAT) 0.4 MG SL tablet   Sublingual   Place 1 tablet (0.4 mg total) under the tongue every 5 (five) minutes as needed for chest pain.   25 tablet   3   . ONETOUCH DELICA LANCETS FINE MISC      Check blood sugar up to 1 time a day   100 each   12     The patient is not insulin requiring, ICD 10 code  ...   . raltegravir (ISENTRESS) 400 MG tablet   Oral   Take 1 tablet (400 mg total) by mouth 2 (two) times daily.   60 tablet   11     Raltegravir instead of Tivicay   . amLODipine (NORVASC) 5 MG tablet   Oral   Take 1 tablet (5 mg total) by mouth daily.   30 tablet   11   . gemfibrozil (LOPID) 600 MG tablet      TK 1 T PO BID 30 MINUTES BEFORE BRE AND DINNER      2   . lisinopril (PRINIVIL,ZESTRIL) 20 MG tablet   Oral   Take 1 tablet (20 mg total) by mouth daily.   30 tablet   11   . methylPREDNISolone (MEDROL DOSEPAK) 4 MG TBPK tablet      Take as directed.   21 tablet   0    Social Hx: Has reduced alcohol intake and consumption of red meat after researching gout online.  Review of Systems: A complete ROS  was negative except as per HPI.  Physical Exam: Filed Vitals:   12/27/15 0823  BP: 158/80  Pulse: 96  Temp: 98.4 F (36.9 C)  TempSrc: Oral  Height: _0  (1.626 m)  Weight: 169 lb 6.4 oz (76.839 kg)  SpO2: 100%   General appearance: alert, cooperative, appears stated age and mild distress Lungs: clear to auscultation bilaterally Heart: regular rate and rhythm, S1, S2 normal, no murmur, click, rub or gallop Abdomen: soft, non-tender; bowel sounds normal; no masses,  no organomegaly Extremities: extremities normal, atraumatic, no cyanosis or edema  MSK: left ankle with TTP and swelling, warm to the touch, assymetricly swollen compared to right, mild TTP over MTP, no other tenderness in large/small joints.  Assessment & Plan:  See encounters tab for problem based medical decision making. Patient seen with Dr. Dareen Piano  Signed: Holley Raring, MD 12/27/2015, 9:52 AM  Pager: [phone]

## 2015-12-28 ENCOUNTER — Telehealth: Payer: Self-pay | Admitting: Internal Medicine

## 2015-12-28 ENCOUNTER — Other Ambulatory Visit: Payer: Self-pay | Admitting: Pharmacist Clinician (PhC)/ Clinical Pharmacy Specialist

## 2015-12-28 ENCOUNTER — Telehealth: Payer: Self-pay | Admitting: Pharmacist Clinician (PhC)/ Clinical Pharmacy Specialist

## 2015-12-28 DIAGNOSIS — B182 Chronic viral hepatitis C: Secondary | ICD-10-CM

## 2015-12-28 NOTE — Telephone Encounter (Signed)
Needs to talk to nurse °

## 2015-12-28 NOTE — Telephone Encounter (Signed)
Patient unclear as to what medications he was supposed to be taking. Verified with patient that he was supposed to stop the lisinopril-HCTZ combo. And start lisinopril only 20 mg. Once daily as well as the amlodipine 5 mg. Daily per the note from his last visit. These medications will come to him by mail order and he said that they have already contacted him to verify the shipment. Pt. Has already picked up the steroid dose pack and started it yesterday. Patient verbalized understanding and will call with any further questions.

## 2015-12-28 NOTE — Telephone Encounter (Signed)
Called John Dickerson to see when he finished his therapy for hep C. He completed it on 6/25 with the triple therapy for his relapse. All of the treatment VL has been undetectable so far. Going to bring him in to do a end of treatment VL Thursday. He'd need a SVR12/24 in a couple of months.

## 2015-12-28 NOTE — Progress Notes (Signed)
Internal Medicine Clinic Attending  I saw and evaluated the patient.  I personally confirmed the key portions of the history and exam documented by Dr. Strelow and I reviewed pertinent patient test results.  The assessment, diagnosis, and plan were formulated together and I agree with the documentation in the resident's note. 

## 2015-12-29 ENCOUNTER — Other Ambulatory Visit: Payer: Medicare Other

## 2015-12-29 ENCOUNTER — Telehealth: Payer: Self-pay | Admitting: Dietician

## 2015-12-29 DIAGNOSIS — B182 Chronic viral hepatitis C: Secondary | ICD-10-CM

## 2015-12-29 LAB — CBC
HEMATOCRIT: 33.7 % — AB (ref 38.5–50.0)
HEMOGLOBIN: 10.9 g/dL — AB (ref 13.2–17.1)
MCH: 29.9 pg (ref 27.0–33.0)
MCHC: 32.3 g/dL (ref 32.0–36.0)
MCV: 92.3 fL (ref 80.0–100.0)
MPV: 8.5 fL (ref 7.5–12.5)
Platelets: 539 10*3/uL — ABNORMAL HIGH (ref 140–400)
RBC: 3.65 MIL/uL — ABNORMAL LOW (ref 4.20–5.80)
RDW: 12.9 % (ref 11.0–15.0)
WBC: 6.4 10*3/uL (ref 3.8–10.8)

## 2015-12-29 LAB — COMPLETE METABOLIC PANEL WITH GFR
ALBUMIN: 4.1 g/dL (ref 3.6–5.1)
ALK PHOS: 81 U/L (ref 40–115)
ALT: 25 U/L (ref 9–46)
AST: 21 U/L (ref 10–35)
BUN: 33 mg/dL — AB (ref 7–25)
CALCIUM: 10 mg/dL (ref 8.6–10.3)
CO2: 25 mmol/L (ref 20–31)
CREATININE: 1.18 mg/dL (ref 0.70–1.33)
Chloride: 99 mmol/L (ref 98–110)
GFR, Est African American: 79 mL/min (ref >=60)
GFR, Est Non African American: 68 mL/min (ref >=60)
Glucose, Bld: 131 mg/dL — ABNORMAL HIGH (ref 65–99)
Potassium: 4.8 mmol/L (ref 3.5–5.3)
Sodium: 135 mmol/L (ref 135–146)
TOTAL PROTEIN: 7.7 g/dL (ref 6.1–8.1)
Total Bilirubin: 0.3 mg/dL (ref 0.2–1.2)

## 2015-12-29 NOTE — Telephone Encounter (Signed)
Patient called asking for glucose test strips. Advised him that his prescription was sent to [facility]. He said he will call them. Patient also informed that sample of 10 strips will be put at the front desk for him to pick up

## 2015-12-30 LAB — HEPATITIS C RNA QUANTITATIVE: HCV Quantitative: NOT DETECTED IU/mL (ref <15)

## 2015-12-31 ENCOUNTER — Telehealth (HOSPITAL_COMMUNITY): Payer: Self-pay | Admitting: Pharmacist Clinician (PhC)/ Clinical Pharmacy Specialist

## 2016-01-01 NOTE — Telephone Encounter (Signed)
Error

## 2016-01-12 ENCOUNTER — Ambulatory Visit (AMBULATORY_SURGERY_CENTER): Payer: Self-pay

## 2016-01-12 VITALS — Ht 64.0 in | Wt 169.4 lb

## 2016-01-12 DIAGNOSIS — Z1211 Encounter for screening for malignant neoplasm of colon: Secondary | ICD-10-CM

## 2016-01-12 NOTE — Progress Notes (Signed)
NO ALLERGIES TO EGGS OR SOY NO PAST PROBLEMS WITH ANESTHESIA NO HOME OXYGEN NO DIET MEDS  HAS INTERNET AND EMAIL; REGISTERED FOR EMMI

## 2016-01-13 ENCOUNTER — Encounter: Payer: Self-pay | Admitting: Physician Assistant

## 2016-01-13 NOTE — Progress Notes (Signed)
Cardiology Office Note    Date:  01/14/2016  ID:  John Dickerson, DOB 10-04-1960, MRN [id number] PCP:  Hinton Lovely, MD  Cardiologist:  Bensimhon/[facility] - CHF clinic saw him for eval of CP per request of Dr. Johnnye Sima.   Chief Complaint: chest pain  History of Present Illness:  John Dickerson is a 55 y.o. male with history of CAD (remote LAD stent 1990s, s/p DESx2 to LAD 11/2015) HIV, DM, HTN, HLD, gout, anemia who presents for post-hospital follow-up. He was recently referred to Dr. Haroldine Laws by Dr. Johnnye Sima for evaluation of chest pain. Outpatient cath was planned demonstrating 80% mid LAD (in area previously treated with a stent) and 80% distal LAD, which were both treated with DES. He had residual nonobstructive disease otherwise - 50% ramus, 80% OM1, 70% ost LPDA, 50% mid-distal LAD. LVEF was normal. He was started on Plavix. Dr. Haroldine Laws discussed statin therapy with ID who cleared the patient for atorvastatin 67m daily. LFTs were normal 12/29/15. LDL in 10/2015 was 99. Of note, Hgb in 2016 was 15-16. In 2017 it has run in the 9-11 range.  He presents back to clinic feeling well. He has not had any recurrent CP or SOB. He is planning on participating in cardiac rehab. Reports compliance with meds. He tries to walk regularly in the morning around 6am before it gets hot. Denies prior w/u for anemia - denies brbpr, melena or hematemesis.   Past Medical History:  Diagnosis Date  . Anemia   . Arthritis    "feet" (11/19/2015)  . CAD (coronary artery disease), native coronary artery    a. remote LAD stent 1990s. b. s/p DESx2 to LAD 11/2015  . Hepatitis C    "going thru the treatments" (11/19/2015)  . High cholesterol   . History of gout    "just recently" (11/19/2015)  . HIV (human immunodeficiency virus infection) ([place]   . Hypertension   . Type II diabetes mellitus ([place]    diagnosed 04-2015    Past Surgical History:  Procedure Laterality Date  . CARDIAC CATHETERIZATION N/A  11/19/2015   Procedure: Left Heart Cath and Coronary Angiography;  Surgeon: DJolaine Artist MD;  [facility];  Service: Cardiovascular;  Laterality: N/A;  . CARDIAC CATHETERIZATION N/A 11/19/2015   Procedure: Coronary Stent Intervention;  Surgeon: JJettie Booze MD;  [facility];  Service: Cardiovascular;  Laterality: N/A;  . CORONARY ANGIOPLASTY WITH STENT PLACEMENT  11/19/2015   "2 stents"    Current Medications: Current Outpatient Prescriptions  Medication Sig Dispense Refill  . amLODipine (NORVASC) 5 MG tablet Take 1 tablet (5 mg total) by mouth daily. 30 tablet 11  . aspirin EC 81 MG tablet Take 1 tablet (81 mg total) by mouth daily. 90 tablet 3  . atenolol (TENORMIN) 100 MG tablet TAKE ONE TABLET BY MOUTH ONCE DAILY 30 tablet 5  . atorvastatin (LIPITOR) 20 MG tablet Take 1 tablet (20 mg total) by mouth daily. 30 tablet 6  . blood glucose meter kit and supplies KIT Dispense based on patient and insurance preference. Use up to four times daily as directed. (FOR ICD-9 250.00, 250.01). 1 each 0  . Blood Glucose Monitoring Suppl (OGleason w/Device KIT 1 each by Does not apply route daily. 1 kit 0  . Elbasvir-Grazoprevir (ZEPATIER) 50-100 MG TABS Take 1 tablet by mouth daily. 28 tablet 3  . emtricitabine-tenofovir AF (DESCOVY) 200-25 MG tablet Take 1 tablet by mouth  daily. 30 tablet 11  . gemfibrozil (LOPID) 600 MG tablet TK 1 T PO BID 30 MINUTES BEFORE BRE AND DINNER  2  . glipiZIDE (GLUCOTROL) 5 MG tablet TAKE 1 TABLET BY MOUTH EVERY DAY BEFORE BREAKFAST 30 tablet 5  . glucose blood (ONETOUCH VERIO) test strip Check blood sugar up to 1 time a day 100 each 12  . lisinopril (PRINIVIL,ZESTRIL) 20 MG tablet Take 1 tablet (20 mg total) by mouth daily. 30 tablet 11  . metFORMIN (GLUCOPHAGE) 1000 MG tablet TAKE 1 TABLET BY MOUTH TWICE DAILY WITH BREAKFAST AND DINNER 180 tablet 0  . nitroGLYCERIN (NITROSTAT) 0.4 MG SL tablet Place 1 tablet  (0.4 mg total) under the tongue every 5 (five) minutes as needed for chest pain. 25 tablet 3  . ONETOUCH DELICA LANCETS FINE MISC Check blood sugar up to 1 time a day 100 each 12  . raltegravir (ISENTRESS) 400 MG tablet Take 1 tablet (400 mg total) by mouth 2 (two) times daily. 60 tablet 11   No current facility-administered medications for this visit.      Allergies:   Review of patient's allergies indicates no known allergies.   Social History   Social History  . Marital status: Married    Spouse name: N/A  . Number of children: N/A  . Years of education: N/A   Social History Main Topics  . Smoking status: Former Smoker    Packs/day: 0.12    Years: 49.00    Types: Cigarettes    Start date: 07/20/2012    Quit date: 02/18/2015  . Smokeless tobacco: Never Used  . Alcohol use No  . Drug use: No  . Sexual activity: Yes    Partners: Female     Comment: pt. given condoms   Other Topics Concern  . None   Social History Narrative  . None     Family History:  The patient's family history includes Cancer in his mother; Diabetes in his mother; Hypertension in his father.   ROS:   Please see the history of present illness. No LEE, orthopnea. All other systems are reviewed and otherwise negative.    PHYSICAL EXAM:   VS:  BP 116/72   Pulse 77   Ht _0  (1.626 m)   Wt 169 lb 3.2 oz (76.7 kg)   BMI 29.04 kg/m   BMI: Body mass index is 29.04 kg/m. GEN: Well nourished, well developed AAM, in no acute distress  HEENT: normocephalic, atraumatic Neck: no JVD, carotid bruits, or masses Cardiac: RRR; no murmurs, rubs, or gallops, no edema  Respiratory:  clear to auscultation bilaterally, normal work of breathing GI: soft, nontender, nondistended, + BS MS: no deformity or atrophy  Skin: warm and dry, no rash, right radial cath site without hematoma or ecchymosis; good pulse. Neuro:  Alert and Oriented x 3, Strength and sensation are intact, follows commands Psych: euthymic mood,  full affect  Wt Readings from Last 3 Encounters:  01/14/16 169 lb 3.2 oz (76.7 kg)  01/12/16 169 lb 6.4 oz (76.8 kg)  12/27/15 169 lb 6.4 oz (76.8 kg)      Studies/Labs Reviewed:   EKG:  EKG was ordered today and personally reviewed by me and demonstrates NSR 77bpm, no acute ST-T changes Recent Labs: 12/29/2015: ALT 25; BUN 33; Creat 1.18; Hemoglobin 10.9; Platelets 539; Potassium 4.8; Sodium 135   Lipid Panel    Component Value Date/Time   CHOL 158 11/03/2015 1013   TRIG 99 11/03/2015 1013  HDL 39 (L) 11/03/2015 1013   CHOLHDL 4.1 11/03/2015 1013   CHOLHDL 10.4 (H) 04/23/2015 1013   VLDL NOT CALC 04/23/2015 1013   LDLCALC 99 11/03/2015 1013    Additional studies/ records that were reviewed today include: Summarized above.    ASSESSMENT & PLAN:   1. CAD with recent unstable angina - doing well post-PCI. Continue DAPT - ultimate duration of Plavix will be at the discretion of PCP. Continue BB and statin. 2. Essential HTN - controlled. Continue current regimen. 3. Hyperlipidemia - per notes, Dr. Haroldine Laws had discussed atorvastatin with ID given concomitant HIV meds - he was cleared for atorvastatin 83m daily. F/u LFTs recently were normal. Would consider repeat LFTs at next OV if not performed in the interim upon GI follow-up. 4. Normocytic anemia - historical drop in Hgb per review of labs over the last year. Most recent MCV was normal but it has been macrocytic in the past. He denies any bleeding. This was present even before addition of Plavix and has remained generally stable in 2017 but is significantly lower than 2016. I strongly encouraged him to contact his PCP to discuss further evaluation and management of this. Monitor for any bleeding on DAPT.  Disposition: F/u with Dr. VIrish Lackin 4-5 months. As above, important to f/u PCP for anemia.  Medication Adjustments/Labs and Tests Ordered: Current medicines are reviewed at length with the patient today.  Concerns  regarding medicines are outlined above. Medication changes, Labs and Tests ordered today are summarized above and listed in the Patient Instructions accessible in Encounters.   SRaechel AchePA-C  01/14/2016 9:05 AM    [facility] [address] [place]  [id number]: [phone] Fax: [phone]

## 2016-01-14 ENCOUNTER — Encounter: Payer: Self-pay | Admitting: Physician Assistant

## 2016-01-14 ENCOUNTER — Ambulatory Visit (INDEPENDENT_AMBULATORY_CARE_PROVIDER_SITE_OTHER): Payer: Medicare Other | Admitting: Physician Assistant

## 2016-01-14 VITALS — BP 116/72 | HR 77 | Ht 64.0 in | Wt 169.2 lb

## 2016-01-14 DIAGNOSIS — I2510 Atherosclerotic heart disease of native coronary artery without angina pectoris: Secondary | ICD-10-CM | POA: Diagnosis not present

## 2016-01-14 DIAGNOSIS — I208 Other forms of angina pectoris: Secondary | ICD-10-CM

## 2016-01-14 DIAGNOSIS — I10 Essential (primary) hypertension: Secondary | ICD-10-CM | POA: Diagnosis not present

## 2016-01-14 DIAGNOSIS — E785 Hyperlipidemia, unspecified: Secondary | ICD-10-CM | POA: Diagnosis not present

## 2016-01-14 DIAGNOSIS — D649 Anemia, unspecified: Secondary | ICD-10-CM | POA: Diagnosis not present

## 2016-01-14 NOTE — Patient Instructions (Signed)
Medication Instructions:  Your physician recommends that you continue on your current medications as directed. Please refer to the Current Medication list given to you today.   Labwork: None ordered  Testing/Procedures: None ordered  Follow-Up: Your physician wants you to follow-up in: 4-5 months with Dr.Varanasi You will receive a reminder letter in the mail two months in advance. If you don't receive a letter, please call our office to schedule the follow-up appointment.   Any Other Special Instructions Will Be Listed Below (If Applicable). Follow up with your primary care physician for further evaluation of anemia   If you need a refill on your cardiac medications before your next appointment, please call your pharmacy.

## 2016-01-17 ENCOUNTER — Telehealth: Payer: Self-pay

## 2016-01-17 NOTE — Telephone Encounter (Signed)
Patient called wanting to get an appointment with his primary care provider. Gave him Dr. Benjamine Mola number at Internal Medicine. Rodman Key, LPN

## 2016-01-24 ENCOUNTER — Telehealth: Payer: Self-pay | Admitting: *Deleted

## 2016-01-24 NOTE — Telephone Encounter (Signed)
Pt. Called concerned that blood sugars where trending too low. Meter readings from past several days were 107, 111, 117, 106. Informed patient that those where good blood sugars and to keep up the good work. Denies any symptoms of hypoglycemia. Says "I just have a lot of energy now".

## 2016-01-26 ENCOUNTER — Encounter: Payer: Medicare Other | Admitting: Gastroenterology

## 2016-01-28 ENCOUNTER — Encounter: Payer: Self-pay | Admitting: Internal Medicine

## 2016-01-28 ENCOUNTER — Telehealth: Payer: Self-pay

## 2016-01-28 ENCOUNTER — Ambulatory Visit (INDEPENDENT_AMBULATORY_CARE_PROVIDER_SITE_OTHER): Payer: Medicare Other | Admitting: Internal Medicine

## 2016-01-28 VITALS — BP 130/77 | HR 94 | Temp 98.1°F | Ht 64.0 in | Wt 170.0 lb

## 2016-01-28 DIAGNOSIS — B182 Chronic viral hepatitis C: Secondary | ICD-10-CM

## 2016-01-28 DIAGNOSIS — Z7984 Long term (current) use of oral hypoglycemic drugs: Secondary | ICD-10-CM

## 2016-01-28 DIAGNOSIS — D649 Anemia, unspecified: Secondary | ICD-10-CM | POA: Diagnosis not present

## 2016-01-28 DIAGNOSIS — I10 Essential (primary) hypertension: Secondary | ICD-10-CM | POA: Diagnosis not present

## 2016-01-28 DIAGNOSIS — D6489 Other specified anemias: Secondary | ICD-10-CM

## 2016-01-28 DIAGNOSIS — Z87891 Personal history of nicotine dependence: Secondary | ICD-10-CM

## 2016-01-28 DIAGNOSIS — E119 Type 2 diabetes mellitus without complications: Secondary | ICD-10-CM | POA: Diagnosis not present

## 2016-01-28 DIAGNOSIS — M10072 Idiopathic gout, left ankle and foot: Secondary | ICD-10-CM | POA: Diagnosis not present

## 2016-01-28 DIAGNOSIS — T50905D Adverse effect of unspecified drugs, medicaments and biological substances, subsequent encounter: Secondary | ICD-10-CM | POA: Diagnosis not present

## 2016-01-28 LAB — GLUCOSE, CAPILLARY: GLUCOSE-CAPILLARY: 83 mg/dL (ref 65–99)

## 2016-01-28 LAB — POCT GLYCOSYLATED HEMOGLOBIN (HGB A1C): Hemoglobin A1C: 5.3

## 2016-01-28 MED ORDER — METHYLPREDNISOLONE 4 MG PO TBPK: ORAL_TABLET | ORAL | 1 refills | Status: DC

## 2016-01-28 MED ORDER — ALLOPURINOL 100 MG PO TABS: 100.0000 mg | ORAL_TABLET | Freq: Every day | ORAL | 1 refills | Status: DC

## 2016-01-28 NOTE — Patient Instructions (Signed)
You should start taking one steroid dose pack for a week until your symptoms are improving. Once your symptoms are less painful you should THEN start taking allopurinol 1 tablet daily. A refill was sent to your pharmacy for the steroid if your symptoms get worse again after starting this other medicine.  We will plan to see you again in 6 weeks to check your uric acid level but please call us back sooner if you start having worsening symptoms despite treatment.

## 2016-01-30 NOTE — Assessment & Plan Note (Signed)
A: John Dickerson now has recurrent foot pain for the past several months consistent with gout. This is supported with uric acid of 12 and xray findings although he did not have his joint tapped for analysis. There are more than 3 episodes now so prophylaxis is warranted. Because of his CAD I would like to limit systemic NSAIDs. His ART therapy includes CYP3A4 inhibitors so metabolism of colchicine would be impaired requiring dose adjustment.  P: Solumedrol tapered dosepak prescribed for 6 days, with 1 refill to repeat next flare Allopurinol 100mg  daily to be started after completing acute treatment

## 2016-01-30 NOTE — Assessment & Plan Note (Signed)
A: Excellent blood sugar control with BGs more than 80% in goal range. Hgb A1c of 5.3% is pretty encouraging even if it a slight underestimation. He is taking his medications and feeling very good lately. He reports no symptoms at this time.  P: Continue metformin 1g BID and glipizide 5mg  daily

## 2016-01-30 NOTE — Assessment & Plan Note (Signed)
His anemia is trending an improvement now that he has completing his HepC treatment. This will likely continue normalizing and we can check a CBC in another 6-8 weeks.

## 2016-01-30 NOTE — Progress Notes (Signed)
   CC: Left ankle and foot pain  HPI:  John Dickerson is a 55 y.o. man with medical history detailed below who is presenting for left foot and ankle pain similar to his symptoms when seen a month ago. After that visit he never went for joint aspiration to confirm a diagnosis. His symptoms did improve partially with a steroid dosepak but after finishing it started worsening again. He has significant pain worsened with weight bearing. There is now involvement of his left first MTP joint as well. He has also noted small painless lumps forming at his left knee and left wrist joints.  See problem based assessment and plan below for additional details.  Past Medical History:  Diagnosis Date  . Anemia   . Arthritis    "feet" (11/19/2015)  . CAD (coronary artery disease), native coronary artery    a. remote LAD stent 1990s. b. s/p DESx2 to LAD 11/2015  . Hepatitis C    "going thru the treatments" (11/19/2015)  . High cholesterol   . History of gout    "just recently" (11/19/2015)  . HIV (human immunodeficiency virus infection) ([place])   . Hypertension   . Type II diabetes mellitus ([place])    diagnosed 04-2015    Review of Systems:  Review of Systems  Constitutional: Negative for fever.  Eyes: Negative for blurred vision.  Respiratory: Negative for shortness of breath.   Cardiovascular: Negative for chest pain and leg swelling.  Gastrointestinal: Negative for constipation and diarrhea.  Genitourinary: Negative for hematuria.  Musculoskeletal: Positive for joint pain. Negative for falls.  Skin: Negative for rash.  Neurological: Negative for sensory change and headaches.    Physical Exam:  Vitals:   01/28/16 1322  BP: 130/77  Pulse: 94  Temp: 98.1 F (36.7 C)  TempSrc: Oral  SpO2: 100%  Weight: 170 lb (77.1 kg)  Height: 5\' 4"  (1.626 m)   GENERAL- alert, co-operative, NAD CARDIAC- RRR, no murmurs, rubs or gallops. RESP- CTAB, no wheezes or crackles. NEURO- Strength upper and  lower extremities- 5/5 EXTREMITIES- firm painless 1cm nodule at medial left knee, mild erythema and pain on ROM of left ankle, erythema and severe pain on dorsiflexion of 1st MTP, RLE normal SKIN- Warm, dry, No rash or lesion. PSYCH- Normal mood and affect, appropriate thought content and speech.    Assessment & Plan:   See Encounters Tab for problem based charting.  Patient discussed with Dr. Dareen Piano

## 2016-01-30 NOTE — Assessment & Plan Note (Signed)
Blood pressure is well controlled today on lisinopril and amlodipine alone. If gout continues to be difficult to manage we could consider losartan as another alternative for increasing uric acid secretion slightly.

## 2016-01-31 NOTE — Progress Notes (Signed)
Internal Medicine Clinic Attending  Case discussed with Dr. Rice at the time of the visit.  We reviewed the resident's history and exam and pertinent patient test results.  I agree with the assessment, diagnosis, and plan of care documented in the resident's note.  

## 2016-02-01 NOTE — Telephone Encounter (Signed)
Steroid-Induced Hyperglycemia Prevention and Management John Dickerson is a 55 y.o. male who meets criteria for [facility] quality improvement program (diabetes patient prescribed short course of steroids).  A/P Current Regimen  Patient prescribed methylprednisolone dosepack x 6 days, currently on day 4 of therapy. Patient taking prednisone in the AM  Prednisone indication: Gout flare  Current DM regimen: Glipizide 5mg  qAM before breakfast, Metformin 1000mg  BID  DM regimen prior to steroid course: Glipizide 5mg  qAM before breakfast, Metformin 1000mg  BID  Home BG Monitoring  Patient does have a meter at home and does check BG at home. Meter was not supplied.  CBGs at home 116-144  CBGs prior to steroid course 131, A1C prior to steroid course 5.3%  S/Sx of hyper- or hypoglycemia: none, none  Medication Management  Additional treatment for BG control is not indicated at this time.  Physician preference level per protocol: Level 1 (Dr. Benjamine Mola)  Medication supply Patient will use own medication  Patient Education  Advised patient to monitor BG while on steroid therapy (at least twice daily prior to first 2 meals of the day).  Patient educated about signs/symptoms and advised to contact clinic if hyper- or hypoglycemic.  Patient did verbalize understanding of information and regimen by repeating back topics discussed.  Follow-up Patient's BG is well controlled despite being on steroid therapy. No medication changes are needed at this time. No further follow-up needed.  Belia Heman, PharmD PGY1 Resident 02/01/2016 11:20 AM

## 2016-02-17 ENCOUNTER — Telehealth: Payer: Self-pay | Admitting: Internal Medicine

## 2016-02-17 DIAGNOSIS — E119 Type 2 diabetes mellitus without complications: Secondary | ICD-10-CM

## 2016-02-17 NOTE — Telephone Encounter (Signed)
Pt requesting Yearly Diabetic Exam Referral.  Pt has no Preference  Please Advise.

## 2016-02-23 ENCOUNTER — Ambulatory Visit: Payer: Medicare Other | Admitting: Infectious Diseases

## 2016-03-14 ENCOUNTER — Encounter: Payer: Self-pay | Admitting: Pharmacist

## 2016-03-14 ENCOUNTER — Ambulatory Visit (INDEPENDENT_AMBULATORY_CARE_PROVIDER_SITE_OTHER): Payer: Medicare HMO | Admitting: Dietician

## 2016-03-14 DIAGNOSIS — Z23 Encounter for immunization: Secondary | ICD-10-CM

## 2016-03-14 DIAGNOSIS — Z7984 Long term (current) use of oral hypoglycemic drugs: Secondary | ICD-10-CM | POA: Diagnosis not present

## 2016-03-14 DIAGNOSIS — Z713 Dietary counseling and surveillance: Secondary | ICD-10-CM | POA: Diagnosis not present

## 2016-03-14 DIAGNOSIS — E119 Type 2 diabetes mellitus without complications: Secondary | ICD-10-CM

## 2016-03-14 NOTE — Progress Notes (Signed)
.   Medical Nutrition Therapy:  Appt start time: 0825 end time:  0930. Visit # 1  Assessment:  Primary concerns today: healthy lifestyle- wants to get rid of his diabetes>. He is checking his feet daily, brushing teeth daily, flosses once in a while, getting regular eye exams and trying to make healthy lifestyle changes. He cooks and hops for his family. He verbalizes knowledge of basic diabetes meal planning principles. He is not interested in an further information at this time to help with weight loss to reach his goal of "getting rid of the diabetes" . He is interested in knowing more about what fats are healthier.  Preferred Learning Style: No preference indicated  Learning Readiness: Ready and  Change in progress  ANTHROPOMETRICS: weight-174.6#, height-64.5", BMI-29  WEIGHT HISTORY:165 in 2008, increased to 183.7# in 06/2015, now back down to ~175 SLEEP:no problems reported MEDICATIONS: metformin, steroids for gout last week BLOOD SUGAR:109/104/136/112/126/109/123/206/208/156/125- yesterday DIETARY INTAKE: Usual eating pattern includes 3 meals and 1-2 snacks per day. Everyday foods include no fried foods, nuts, fruit,   Avoided foods include fish.   24-hr recall:  B ( AM): cereal, milk and coffee Snk ( AM): apple  L ( PM): sandwich- sometimes bologna and mustard with coffee Snk ( PM): boiled egg D ( PM): vegetables, baked chicken or [place] or pork Beverages: water, coffee  Usual physical activity: walks 2 miles a day that takes him ~ 60 minutes   Progress Towards Goal(s):  Some progress.   Nutritional Diagnosis:  NI-5.6.3 Inappropriate intake of fats (specify): saturated As related to food choices.  As evidenced by his stated food choices.    Intervention:  Nutrition education about healthier fat options. Patient says he is going to buy some olive oil today. Coordination of care: flu vaccine administered by nursing, signed him up for silver sneakers today.  Teaching Method  Utilized: Visual, Auditory, Hands on Handouts given during visit include: healthy fats Barriers to learning/adherence to lifestyle change: competing values Demonstrated degree of understanding via:  Teach Back   Monitoring/Evaluation:  Dietary intake, exercise, meter, and body weight in 6 month(s).

## 2016-03-14 NOTE — Patient Instructions (Addendum)
You are doing a great!   Keep up the great work.  175# or less is a good weight for you. This lowered your BMI to a healthier range   your HDL cholesterol is 39. It should be >40.   Weight loss and exercise can make it go up.   See you next March 2018

## 2016-03-22 ENCOUNTER — Encounter: Payer: Self-pay | Admitting: Infectious Diseases

## 2016-03-22 ENCOUNTER — Ambulatory Visit (INDEPENDENT_AMBULATORY_CARE_PROVIDER_SITE_OTHER): Payer: Medicare HMO | Admitting: Infectious Diseases

## 2016-03-22 VITALS — BP 128/84 | HR 74 | Temp 98.1°F | Wt 177.0 lb

## 2016-03-22 DIAGNOSIS — Z79899 Other long term (current) drug therapy: Secondary | ICD-10-CM

## 2016-03-22 DIAGNOSIS — B1710 Acute hepatitis C without hepatic coma: Secondary | ICD-10-CM

## 2016-03-22 DIAGNOSIS — B20 Human immunodeficiency virus [HIV] disease: Secondary | ICD-10-CM

## 2016-03-22 LAB — LIPID PANEL
CHOL/HDL RATIO: 3.2 ratio (ref <=5.0)
CHOLESTEROL: 130 mg/dL (ref 125–200)
HDL: 41 mg/dL (ref >=40)
LDL Cholesterol: 80 mg/dL (ref <130)
Triglycerides: 47 mg/dL (ref <150)
VLDL: 9 mg/dL (ref <30)

## 2016-03-22 LAB — COMPREHENSIVE METABOLIC PANEL
ALT: 14 U/L (ref 9–46)
AST: 10 U/L (ref 10–35)
Albumin: 3.9 g/dL (ref 3.6–5.1)
Alkaline Phosphatase: 69 U/L (ref 40–115)
BUN: 21 mg/dL (ref 7–25)
CHLORIDE: 104 mmol/L (ref 98–110)
CO2: 25 mmol/L (ref 20–31)
CREATININE: 1.08 mg/dL (ref 0.70–1.33)
Calcium: 9.9 mg/dL (ref 8.6–10.3)
GLUCOSE: 136 mg/dL — AB (ref 65–99)
Potassium: 4.5 mmol/L (ref 3.5–5.3)
SODIUM: 139 mmol/L (ref 135–146)
Total Bilirubin: 0.4 mg/dL (ref 0.2–1.2)
Total Protein: 6.8 g/dL (ref 6.1–8.1)

## 2016-03-22 LAB — CBC
HCT: 38.4 % — ABNORMAL LOW (ref 38.5–50.0)
Hemoglobin: 13 g/dL — ABNORMAL LOW (ref 13.2–17.1)
MCH: 29.3 pg (ref 27.0–33.0)
MCHC: 33.9 g/dL (ref 32.0–36.0)
MCV: 86.5 fL (ref 80.0–100.0)
MPV: 8.8 fL (ref 7.5–12.5)
PLATELETS: 285 10*3/uL (ref 140–400)
RBC: 4.44 MIL/uL (ref 4.20–5.80)
RDW: 14.8 % (ref 11.0–15.0)
WBC: 5.9 10*3/uL (ref 3.8–10.8)

## 2016-03-22 NOTE — Progress Notes (Signed)
   Subjective:    Patient ID: John Dickerson, male    DOB: [birth year], 55 y.o.   MRN: [id number]  HPI 55 yo M with hx of HIV+ was Dx 1998, and HTN. Hep C 1a, 14.8 million VL October 2015. 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  He was on atripla til changed to [place] for Hep C rx.  Previously Hep A/B (S Ab)+. He was started on zepatier/sovaldi/rib (plan 16 weeks) on 2-25. His ART was changed to ISN/Desc at that time.  Repeat Hep C RNA (-).   Today c/o sinus congestion.  Has been using spray prn.   HIV 1 RNA Quant (copies/mL)  Date Value  09/02/2015 61 (H)  04/23/2015 <20  03/11/2015 <20   CD4 T Cell Abs (/uL)  Date Value  04/22/2015 540  03/11/2015 540  08/10/2014 430   wishes to stay on current ART. Has not had labs yet for this visit.   Review of Systems  Constitutional: Negative for appetite change and unexpected weight change.  Gastrointestinal: Negative for diarrhea and nausea.  Genitourinary: Negative for difficulty urinating.   Has not had colon. Was prev scheduled but canceled due to difficulties with diet, medication stop. Has done stool guiac cards.  Has ophtho appt scheduled.     Objective:   Physical Exam  Constitutional: He appears well-developed and well-nourished.  HENT:  Mouth/Throat: No oropharyngeal exudate.  Eyes: EOM are normal. Pupils are equal, round, and reactive to light.  Neck: Neck supple.  Cardiovascular: Normal rate, regular rhythm and normal heart sounds.   Pulmonary/Chest: Effort normal and breath sounds normal.  Abdominal: Soft. Bowel sounds are normal. There is no tenderness. There is no rebound.  Musculoskeletal: He exhibits no edema.  Lymphadenopathy:    He has no cervical adenopathy.      Assessment & Plan:

## 2016-03-22 NOTE — Assessment & Plan Note (Signed)
Will check Hep C RNA at next visit.  Doing well.

## 2016-03-22 NOTE — Assessment & Plan Note (Signed)
He is doing well Will check his labs today Has gotten flu shot.  Offered/refused condoms.  Will see him back in 6 months.

## 2016-03-22 NOTE — Assessment & Plan Note (Signed)
Greatly appreciate IMTS excellent care of pt.

## 2016-03-23 LAB — T-HELPER CELL (CD4) - (RCID CLINIC ONLY)
CD4 T CELL ABS: 550 /uL (ref 400–2700)
CD4 T CELL HELPER: 26 % — AB (ref 33–55)

## 2016-03-23 LAB — HIV-1 RNA QUANT-NO REFLEX-BLD: HIV 1 RNA Quant: <20 copies/mL (ref <20)

## 2016-03-29 ENCOUNTER — Other Ambulatory Visit: Payer: Self-pay | Admitting: Internal Medicine

## 2016-03-29 DIAGNOSIS — M10072 Idiopathic gout, left ankle and foot: Secondary | ICD-10-CM

## 2016-03-29 MED ORDER — ALLOPURINOL 100 MG PO TABS: 100.0000 mg | ORAL_TABLET | Freq: Every day | ORAL | 1 refills | Status: DC

## 2016-03-29 NOTE — Telephone Encounter (Signed)
allopurinol (ZYLOPRIM) 100 MG tablet [facility]

## 2016-03-30 ENCOUNTER — Other Ambulatory Visit: Payer: Self-pay | Admitting: Internal Medicine

## 2016-03-30 DIAGNOSIS — M10072 Idiopathic gout, left ankle and foot: Secondary | ICD-10-CM

## 2016-04-13 ENCOUNTER — Other Ambulatory Visit: Payer: Self-pay | Admitting: Internal Medicine

## 2016-04-13 DIAGNOSIS — E1159 Type 2 diabetes mellitus with other circulatory complications: Secondary | ICD-10-CM

## 2016-04-13 DIAGNOSIS — N521 Erectile dysfunction due to diseases classified elsewhere: Principal | ICD-10-CM

## 2016-04-13 LAB — HM DIABETES EYE EXAM

## 2016-04-14 ENCOUNTER — Other Ambulatory Visit: Payer: Self-pay | Admitting: Internal Medicine

## 2016-04-14 NOTE — Telephone Encounter (Signed)
I called pt this morning to see why he needs a fill on this medication; no answer - left message.

## 2016-04-17 ENCOUNTER — Encounter: Payer: Self-pay | Admitting: *Deleted

## 2016-04-20 NOTE — Telephone Encounter (Signed)
Pt states gout flare-up of wrist and foot; taking Ibuprofen which has helped somewhat.

## 2016-05-10 ENCOUNTER — Other Ambulatory Visit: Payer: Self-pay | Admitting: Internal Medicine

## 2016-05-10 DIAGNOSIS — M79671 Pain in right foot: Secondary | ICD-10-CM

## 2016-06-13 ENCOUNTER — Ambulatory Visit (HOSPITAL_COMMUNITY)
Admission: EM | Admit: 2016-06-13 | Discharge: 2016-06-13 | Disposition: A | Discharge status: Home / Self Care | Payer: Medicare HMO | Attending: Family Medicine | Admitting: Family Medicine

## 2016-06-13 ENCOUNTER — Encounter (HOSPITAL_COMMUNITY): Payer: Self-pay | Admitting: Emergency Medicine

## 2016-06-13 DIAGNOSIS — M1000 Idiopathic gout, unspecified site: Secondary | ICD-10-CM | POA: Diagnosis not present

## 2016-06-13 DIAGNOSIS — M25462 Effusion, left knee: Secondary | ICD-10-CM | POA: Diagnosis not present

## 2016-06-13 MED ORDER — PREDNISONE 20 MG PO TABS: ORAL_TABLET | ORAL | 0 refills | Status: DC

## 2016-06-13 MED ORDER — HYDROCODONE-ACETAMINOPHEN 5-325 MG PO TABS: 1.0000 | ORAL_TABLET | Freq: Four times a day (QID) | ORAL | 0 refills | Status: DC | PRN

## 2016-06-13 NOTE — ED Triage Notes (Signed)
The patient presented to the [facility] with a complaint of left knee pain and swelling x 8 days. The patient reported a hx of gout.

## 2016-06-13 NOTE — ED Provider Notes (Signed)
[place]    CSN: [id number] Arrival date & time: 06/13/16  1627     History   Chief Complaint Chief Complaint  Patient presents with  . Knee Pain    HPI John Dickerson is a 55 y.o. male.   This Is a 55 year old man who presents to the [facility] urgent care center with left knee pain and swelling. He has a history of gout and has had problems with that left knee for the last month. He was given a prescription for allopurinol several weeks ago and it seems to make things worse.      Past Medical History:  Diagnosis Date  . Anemia   . Arthritis    "feet" (11/19/2015)  . CAD (coronary artery disease), native coronary artery    a. remote LAD stent 1990s. b. s/p DESx2 to LAD 11/2015  . Hepatitis C    "going thru the treatments" (11/19/2015)  . High cholesterol   . History of gout    "just recently" (11/19/2015)  . HIV (human immunodeficiency virus infection) ([place])   . Hypertension   . Type II diabetes mellitus ([place])    diagnosed 04-2015    Patient Active Problem List   Diagnosis Date Noted  . HTN (hypertension) essential 11/20/2015  . S/P angioplasty with stent, 11/19/15 DES X 2 mLAD and dLAD, normal EF  11/20/2015  . Coronary artery disease involving native coronary artery of native heart   . Exertional angina (HCC) 11/17/2015  . Gout 10/29/2015  . Anemia due to medication 09/24/2015  . Type 2 diabetes mellitus with circulatory disorder causing erectile dysfunction ([place]) 05/11/2015  . Preventative health care 05/11/2015  . Hyperlipemia 05/11/2015  . Arthritis of shoulder region, left 03/24/2013  . Hypertriglyceridemia 03/24/2013  . ERECTILE DYSFUNCTION 09/12/2006  . Human immunodeficiency virus (HIV) disease ([place]) 08/09/2006  . Acute hepatitis C virus infection 08/09/2006  . Essential hypertension 08/09/2006    Past Surgical History:  Procedure Laterality Date  . CARDIAC CATHETERIZATION N/A 11/19/2015   Procedure: Left Heart  Cath and Coronary Angiography;  Surgeon: Jolaine Artist, MD;  Location: [place] [facility];  Service: Cardiovascular;  Laterality: N/A;  . CARDIAC CATHETERIZATION N/A 11/19/2015   Procedure: Coronary Stent Intervention;  Surgeon: Jettie Booze, MD;  Location: [place] CV LAB;  Service: Cardiovascular;  Laterality: N/A;  . CORONARY ANGIOPLASTY WITH STENT PLACEMENT  11/19/2015   "2 stents"       Home Medications    Prior to Admission medications   Medication Sig Start Date End Date Taking? Authorizing Provider  amLODipine (NORVASC) 5 MG tablet Take 1 tablet (5 mg total) by mouth daily. 12/27/15 12/26/16  Holley Raring, MD  aspirin EC 81 MG tablet Take 1 tablet (81 mg total) by mouth daily. 11/17/15   Jolaine Artist, MD  atenolol (TENORMIN) 100 MG tablet TAKE ONE TABLET BY MOUTH ONCE DAILY 05/31/15   Campbell Riches, MD  atorvastatin (LIPITOR) 20 MG tablet Take 1 tablet (20 mg total) by mouth daily. 11/20/15   Isaiah Serge, NP  blood glucose meter kit and supplies KIT Dispense based on patient and insurance preference. Use up to four times daily as directed. (FOR ICD-9 250.00, 250.01). 08/03/15   Ejiroghene E Emokpae, MD  Blood Glucose Monitoring Suppl ([place]) w/Device KIT 1 each by Does not apply route daily. 12/22/15   Collier Salina, MD  emtricitabine-tenofovir AF (DESCOVY) 200-25 MG tablet Take 1  tablet by mouth daily. 08/03/15   Campbell Riches, MD  gemfibrozil (LOPID) 600 MG tablet TK 1 T PO BID 30 MINUTES BEFORE BRE AND DINNER 10/28/15   Historical Provider, MD  glipiZIDE (GLUCOTROL) 5 MG tablet TAKE 1 TABLET BY MOUTH EVERY DAY BEFORE BREAKFAST 05/15/16   Collier Salina, MD  glucose blood (ONETOUCH VERIO) test strip Check blood sugar up to 1 time a day 12/22/15   Collier Salina, MD  HYDROcodone-acetaminophen (NORCO) 5-325 MG tablet Take 1 tablet by mouth every 6 (six) hours as needed for moderate pain. 06/13/16   Robyn Haber, MD  lisinopril  (PRINIVIL,ZESTRIL) 20 MG tablet Take 1 tablet (20 mg total) by mouth daily. 12/27/15 12/26/16  Holley Raring, MD  metFORMIN (GLUCOPHAGE) 1000 MG tablet Take 1 tablet (1,000 mg total) by mouth 2 (two) times daily with a meal. 04/13/16   Oval Linsey, MD  methylPREDNISolone (MEDROL DOSEPAK) 4 MG TBPK tablet TAKE AS DIRECTED 04/20/16   Collier Salina, MD  nitroGLYCERIN (NITROSTAT) 0.4 MG SL tablet Place 1 tablet (0.4 mg total) under the tongue every 5 (five) minutes as needed for chest pain. 11/17/15   Jolaine Artist, MD  [facility] DELICA LANCETS FINE MISC Check blood sugar up to 1 time a day 12/22/15   Collier Salina, MD  predniSONE (DELTASONE) 20 MG tablet Two daily with food 06/13/16   Robyn Haber, MD  raltegravir (ISENTRESS) 400 MG tablet Take 1 tablet (400 mg total) by mouth 2 (two) times daily. 08/03/15   Campbell Riches, MD  VOLTAREN 1 % GEL APPLY TOPICALLY 4 GRAMS TO THE AFFECTED AREA 4 TIMES DAILY 05/15/16   Collier Salina, MD    Family History Family History  Problem Relation Age of Onset  . Diabetes Mother   . Cancer Mother     unkown type  . Hypertension Father   . Colon cancer Neg Hx     Social History Social History  Substance Use Topics  . Smoking status: Former Smoker    Packs/day: 0.12    Years: 49.00    Types: Cigarettes    Start date: 07/20/2012    Quit date: 02/18/2015  . Smokeless tobacco: Never Used  . Alcohol use No     Allergies   Patient has no known allergies.   Review of Systems Review of Systems  Constitutional: Negative.   Musculoskeletal: Positive for arthralgias, gait problem and joint swelling.     Physical Exam Triage Vital Signs ED Triage Vitals  Enc Vitals Group     BP 06/13/16 1643 160/77     Pulse Rate 06/13/16 1643 101     Resp 06/13/16 1643 18     Temp 06/13/16 1643 98.2 F (36.8 C)     Temp Source 06/13/16 1643 Oral     SpO2 06/13/16 1643 99 %     Weight --      Height --      Head Circumference --      Peak  Flow --      Pain Score 06/13/16 1645 10     Pain Loc --      Pain Edu? --      Excl. in [place]? --    No data found.   Updated Vital Signs BP 160/77 (BP Location: Left Arm)   Pulse 101   Temp 98.2 F (36.8 C) (Oral)   Resp 18   SpO2 99%   Visual Acuity Right Eye Distance:   Left  Eye Distance:   Bilateral Distance:    Right Eye Near:   Left Eye Near:    Bilateral Near:     Physical Exam  Constitutional: He is oriented to person, place, and time. He appears well-developed and well-nourished. He appears distressed.  HENT:  Right Ear: External ear normal.  Left Ear: External ear normal.  Mouth/Throat: Oropharynx is clear and moist.  Eyes: Conjunctivae and EOM are normal.  Neck: Normal range of motion. Neck supple.  Pulmonary/Chest: Effort normal.  Musculoskeletal: He exhibits tenderness. He exhibits no deformity.  Patient can only bend his knee slowly because of pain.  He has mild effusion but is tender along the entire left knee joint line.  Neurological: He is alert and oriented to person, place, and time.  Skin: Skin is warm and dry.  Nursing note and vitals reviewed.    UC Treatments / Results  Labs (all labs ordered are listed, but only abnormal results are displayed) Labs Reviewed - No data to display  EKG  EKG Interpretation None       Radiology No results found.  Procedures Procedures (including critical care time)  Medications Ordered in UC Medications - No data to display   Initial Impression / Assessment and Plan / UC Course  I have reviewed the triage vital signs and the nursing notes.  Pertinent labs & imaging results that were available during my care of the patient were reviewed by me and considered in my medical decision making (see chart for details).  Clinical Course      Final Clinical Impressions(s) / UC Diagnoses   Final diagnoses:  Effusion of left knee  Acute idiopathic gout, unspecified site    New Prescriptions New  Prescriptions   HYDROCODONE-ACETAMINOPHEN (NORCO) 5-325 MG TABLET    Take 1 tablet by mouth every 6 (six) hours as needed for moderate pain.   PREDNISONE (DELTASONE) 20 MG TABLET    Two daily with food     Robyn Haber, MD 06/13/16 [phone]

## 2016-06-15 ENCOUNTER — Other Ambulatory Visit: Payer: Self-pay | Admitting: Internal Medicine

## 2016-06-15 DIAGNOSIS — M79671 Pain in right foot: Secondary | ICD-10-CM

## 2016-06-18 ENCOUNTER — Other Ambulatory Visit: Payer: Self-pay | Admitting: Internal Medicine

## 2016-06-18 DIAGNOSIS — M10072 Idiopathic gout, left ankle and foot: Secondary | ICD-10-CM

## 2016-07-17 ENCOUNTER — Other Ambulatory Visit: Payer: Self-pay | Admitting: Infectious Diseases

## 2016-07-21 ENCOUNTER — Ambulatory Visit (INDEPENDENT_AMBULATORY_CARE_PROVIDER_SITE_OTHER): Payer: Medicare HMO | Admitting: Internal Medicine

## 2016-07-21 ENCOUNTER — Encounter: Payer: Self-pay | Admitting: Internal Medicine

## 2016-07-21 ENCOUNTER — Other Ambulatory Visit: Payer: Self-pay | Admitting: Internal Medicine

## 2016-07-21 VITALS — BP 144/77 | HR 91 | Temp 98.1°F | Ht 64.0 in | Wt 177.9 lb

## 2016-07-21 DIAGNOSIS — Z7984 Long term (current) use of oral hypoglycemic drugs: Secondary | ICD-10-CM

## 2016-07-21 DIAGNOSIS — I10 Essential (primary) hypertension: Secondary | ICD-10-CM

## 2016-07-21 DIAGNOSIS — M1A9XX0 Chronic gout, unspecified, without tophus (tophi): Secondary | ICD-10-CM | POA: Diagnosis not present

## 2016-07-21 DIAGNOSIS — Z87891 Personal history of nicotine dependence: Secondary | ICD-10-CM

## 2016-07-21 DIAGNOSIS — N521 Erectile dysfunction due to diseases classified elsewhere: Secondary | ICD-10-CM | POA: Diagnosis not present

## 2016-07-21 DIAGNOSIS — E1159 Type 2 diabetes mellitus with other circulatory complications: Secondary | ICD-10-CM | POA: Diagnosis not present

## 2016-07-21 DIAGNOSIS — M10072 Idiopathic gout, left ankle and foot: Secondary | ICD-10-CM

## 2016-07-21 DIAGNOSIS — Z79899 Other long term (current) drug therapy: Secondary | ICD-10-CM

## 2016-07-21 LAB — GLUCOSE, CAPILLARY: GLUCOSE-CAPILLARY: 87 mg/dL (ref 65–99)

## 2016-07-21 LAB — POCT GLYCOSYLATED HEMOGLOBIN (HGB A1C): Hemoglobin A1C: 6

## 2016-07-21 MED ORDER — PREDNISONE 10 MG PO TABS: ORAL_TABLET | ORAL | 0 refills | Status: DC

## 2016-07-21 MED ORDER — LOSARTAN POTASSIUM 50 MG PO TABS: 50.0000 mg | ORAL_TABLET | Freq: Every day | ORAL | 1 refills | Status: DC

## 2016-07-21 NOTE — Progress Notes (Signed)
   CC: Gout  HPI:  John Dickerson is a 56 y.o. man here for recurrent gout flares in December and currently in his right foot.  See problem based assessment and plan below for additional details  Past Medical History:  Diagnosis Date  . Anemia   . Arthritis    "feet" (11/19/2015)  . CAD (coronary artery disease), native coronary artery    a. remote LAD stent 1990s. b. s/p DESx2 to LAD 11/2015  . Hepatitis C    "going thru the treatments" (11/19/2015)  . High cholesterol   . History of gout    "just recently" (11/19/2015)  . HIV (human immunodeficiency virus infection) ([place])   . Hypertension   . Type II diabetes mellitus ([place])    diagnosed 04-2015    Review of Systems:  Review of Systems  Constitutional: Negative for fever.  Cardiovascular: Negative for leg swelling.  Genitourinary: Negative for dysuria.  Musculoskeletal: Positive for joint pain. Negative for falls.  Neurological: Negative for sensory change.    Physical Exam: Physical Exam  Constitutional: He is well-developed, well-nourished, and in no distress.  Cardiovascular: Normal rate and regular rhythm.   Pulmonary/Chest: Effort normal and breath sounds normal.  Abdominal: Soft. He exhibits no distension.  Musculoskeletal:  Right, lateral ankle is warm, swollen, tender to palpation and very painful with inversion Right 1st MTP appears slightly swollen but is nontender to examination  Skin: Skin is warm and dry. No rash noted. No erythema.    Vitals:   07/21/16 1358  BP: (!) 144/77  Pulse: 91  Temp: 98.1 F (36.7 C)  TempSrc: Oral  SpO2: 100%  Weight: 177 lb 14.4 oz (80.7 kg)  Height: 5\' 4"  (1.626 m)    Assessment & Plan:   See Encounters Tab for problem based charting.  Patient discussed with Dr. Dareen Piano

## 2016-07-21 NOTE — Patient Instructions (Signed)
It was a pleasure to see you today John Dickerson.  I sent a prescription for prednisone to help treat your current gout flare. Take up to 2 tablets daily until symptoms improve. Once these symptoms resolve I recommend restarting your allopurinol 100mg  daily.  I recommend changing your blood pressure mediation from lisinopril 20mg  once daily to losartan 50mg  once daily.

## 2016-07-24 NOTE — Assessment & Plan Note (Signed)
HPI: He continues to take metformin 1000 mg twice daily and glipizide 5 mg daily without any difficulties. He is not noticing any episodes of hypoglycemia. He denies any specific problems with headache, vision changes, or increased thirst. Over the interval he had an ophthalmology evaluation in October with no problems. Hemoglobin A1c is 6.0% today  A: Well-controlled type 2 diabetes with goal Hgb A1c < 7.0% He does not need to be checking blood sugars at home  P: Continue metformin 1000 mg twice a day and glipizide 5 mg daily

## 2016-07-24 NOTE — Assessment & Plan Note (Signed)
HPI: He is having recurrent gout flares several times a year and most recently in December and at his visit today. Unfortunately he seemed to have some confusion after his December gout flare and discontinued allopurinol at that time. He continues to eat a diet fairly high in red meats and drinks alcohol regularly that's contributing to his flares.  A: Recurrent gout flares greater than 3 episodes a year This warrants getting him back on prophylaxis with allopurinol. We can also switching his lisinopril to losartan which has a mild benefit in uric acid excretion  P: -5 day oral prednisone taper for current flare -Instructed to resume allopurinol 100mg  daily after symptoms resolve -Discontinue lisinopril, start losartan 50mg  daily

## 2016-07-24 NOTE — Progress Notes (Signed)
Internal Medicine Clinic Attending  Case discussed with Dr. Rice at the time of the visit.  We reviewed the resident's history and exam and pertinent patient test results.  I agree with the assessment, diagnosis, and plan of care documented in the resident's note.  

## 2016-07-24 NOTE — Assessment & Plan Note (Addendum)
He is mildly above goal today at 144/77. He is certainly not in any urgent exacerbation. His therapy is being changed to losartan for uric acid management so we will reassess blood pressure control after that change.

## 2016-07-31 ENCOUNTER — Other Ambulatory Visit: Payer: Self-pay | Admitting: Internal Medicine

## 2016-07-31 DIAGNOSIS — M79671 Pain in right foot: Secondary | ICD-10-CM

## 2016-08-04 ENCOUNTER — Encounter: Payer: Self-pay | Admitting: Internal Medicine

## 2016-08-04 ENCOUNTER — Ambulatory Visit (INDEPENDENT_AMBULATORY_CARE_PROVIDER_SITE_OTHER): Payer: Medicare HMO | Admitting: Internal Medicine

## 2016-08-04 DIAGNOSIS — Z87891 Personal history of nicotine dependence: Secondary | ICD-10-CM

## 2016-08-04 DIAGNOSIS — Z79899 Other long term (current) drug therapy: Secondary | ICD-10-CM | POA: Diagnosis not present

## 2016-08-04 DIAGNOSIS — Z21 Asymptomatic human immunodeficiency virus [HIV] infection status: Secondary | ICD-10-CM

## 2016-08-04 DIAGNOSIS — M10071 Idiopathic gout, right ankle and foot: Secondary | ICD-10-CM

## 2016-08-04 DIAGNOSIS — I10 Essential (primary) hypertension: Secondary | ICD-10-CM

## 2016-08-04 DIAGNOSIS — M10072 Idiopathic gout, left ankle and foot: Secondary | ICD-10-CM

## 2016-08-04 MED ORDER — COLCHICINE 0.6 MG PO TABS: 0.6000 mg | ORAL_TABLET | Freq: Every day | ORAL | 1 refills | Status: DC

## 2016-08-04 MED ORDER — ALLOPURINOL 100 MG PO TABS
100.0000 mg | ORAL_TABLET | Freq: Every day | ORAL | 3 refills | Status: DC
Start: 2016-08-04 — End: 2016-09-08

## 2016-08-04 NOTE — Progress Notes (Signed)
    CC: Follow-up for gout  HPI: [name] is a 56 y.o. male with PMHx of recurrent gout, hypertension who presents to the clinic for follow-up for gout.   Patient states he first developed gout in the last year. Since that time, he states he's had over 10 gout attacks which primarily occur in his ankles and feet. His last attack was on 07/21/2016. He was given a 5 day prednisone taper. He reports his pain and swelling have resolved. He attributes his gout flares to overconsumption of red meat and uses alcohol. He was previously on allopurinol in the past but has been intermittently compliant with that. He has last taken in December 2017. At his office visit on February 2, he was switched from lisinopril to losartan 50 mg once a day. He has not made this switch yet. He has not restarted his allopurinol yet. His uric acid in July 2017 was 12.  Past Medical History:  Diagnosis Date  . Anemia   . Arthritis    "feet" (11/19/2015)  . CAD (coronary artery disease), native coronary artery    a. remote LAD stent 1990s. b. s/p DESx2 to LAD 11/2015  . Hepatitis C    "going thru the treatments" (11/19/2015)  . High cholesterol   . History of gout    "just recently" (11/19/2015)  . HIV (human immunodeficiency virus infection) ([place])   . Hypertension   . Type II diabetes mellitus ([place])    diagnosed 04-2015     Review of Systems: Please see pertinent ROS reviewed in HPI and problem based charting.   Physical Exam: Vitals:   08/04/16 0916  BP: (!) 156/83  Pulse: 73  Temp: 98.1 F (36.7 C)  TempSrc: Oral  SpO2: 100%  Weight: 178 lb 4.8 oz (80.9 kg)  Height: 5\' 3"  (1.6 m)   General: Vital signs reviewed.  Patient is well-developed and well-nourished, in no acute distress and cooperative with exam.  Cardiovascular: RRR, S1 normal, S2 normal, no murmurs, gallops, or rubs. Pulmonary/Chest: Clear to auscultation bilaterally, no wheezes, rales, or rhonchi. Abdominal: Soft, non-tender,  non-distended, BS + Musculoskeletal: No erythema, Edema, increased warmth or tenderness in feet. No evidence of tophi.. Extremities: No lower extremity edema bilaterally,  pulses symmetric and intact bilaterally.  Skin: Warm, dry and intact. No rashes or erythema. Psychiatric: Normal mood and affect. speech and behavior is normal. Cognition and memory are normal.   Assessment & Plan:  See encounters tab for problem based medical decision making. Patient discussed with Dr. Evette Doffing

## 2016-08-04 NOTE — Assessment & Plan Note (Addendum)
Patient states he first developed gout in the last year. Since that time, he states he's had over 10 gout attacks which primarily occur in his ankles and feet. His last attack was on 07/21/2016. He was given a 5 day prednisone taper. He reports his pain and swelling have resolved. He attributes his gout flares to overconsumption of red meat and uses alcohol. He was previously on allopurinol in the past but has been intermittently compliant with that. He has last taken in December 2017. At his office visit on February 2, he was switched from lisinopril to losartan 50 mg once a day. He has not made this switch yet. He has not restarted his allopurinol yet. His uric acid in July 2017 was 12. Patient would likely benefit from colchicine therapy along with allopurinol to help prevent gout flares during initiation of uric acid therapy. I do not see any interactions with his colchicine and current HIV medications. This would need to be discontinued if he were to start CYP 3 a 4 inhibitors due to increased concentration of colchicine. We may need to change his statin medication, I will discuss this with pharmacy. Patient should be continued on colchicine for 3-6 months after reaching uric acid goal. There is a risk of colchicine with atorvastatin, but given atorvastatin has a longer half life this medication is typically considered safer than those statins that give more of a burst.  Plan: -Allopurinol 100 mg once a day -Colchicine 0.6 mg once a day -At follow-up, please assess for myalgias given concomitant use of colchicine and atorvastatin -Also, if patient has intolerable GI side effects, consider discontinuing colchicine

## 2016-08-04 NOTE — Patient Instructions (Signed)
For your gout: Take allopurinol 100 mg once a day and colchicine 0.6 mg once a day.  For your blood pressure: Take losartan 50 mg once a day.  Please follow up in 1 month for blood pressure recheck.

## 2016-08-04 NOTE — Assessment & Plan Note (Signed)
BP Readings from Last 3 Encounters:  08/04/16 (!) 156/83  07/21/16 (!) 144/77  06/13/16 160/77    Lab Results  Component Value Date   NA 139 03/22/2016   K 4.5 03/22/2016   CREATININE 1.08 03/22/2016    Assessment: Blood pressure control:  uncontrolled Progress toward BP goal:   deteriorated Comments: Lisinopril recently discontinued and switched to losartan 50 mg daily for beneficial effect on gout. However, although patient has stopped his lisinopril he has not started taking his losartan medication.  Plan: Medications:  continue current medications Other plans: Follow up in 1 month for blood pressure recheck

## 2016-08-08 ENCOUNTER — Encounter: Payer: Self-pay | Admitting: Infectious Diseases

## 2016-08-08 NOTE — Progress Notes (Signed)
Internal Medicine Clinic Attending  Case discussed with Dr. Burns at the time of the visit.  We reviewed the resident's history and exam and pertinent patient test results.  I agree with the assessment, diagnosis, and plan of care documented in the resident's note.  

## 2016-08-24 ENCOUNTER — Other Ambulatory Visit: Payer: Self-pay | Admitting: Infectious Diseases

## 2016-08-31 ENCOUNTER — Telehealth: Payer: Self-pay | Admitting: Internal Medicine

## 2016-08-31 NOTE — Telephone Encounter (Signed)
APT. REMINDER CALL, NO ANSWER, NO VOICEMAIL °

## 2016-09-01 ENCOUNTER — Ambulatory Visit: Payer: Medicare HMO

## 2016-09-06 ENCOUNTER — Other Ambulatory Visit: Payer: Medicare HMO

## 2016-09-06 ENCOUNTER — Other Ambulatory Visit (HOSPITAL_COMMUNITY)
Admission: RE | Admit: 2016-09-06 | Discharge: 2016-09-06 | Disposition: A | Discharge status: Home / Self Care | Payer: Medicare HMO | Source: Ambulatory Visit | Attending: Infectious Diseases | Admitting: Infectious Diseases

## 2016-09-06 DIAGNOSIS — B182 Chronic viral hepatitis C: Secondary | ICD-10-CM

## 2016-09-06 DIAGNOSIS — Z21 Asymptomatic human immunodeficiency virus [HIV] infection status: Secondary | ICD-10-CM

## 2016-09-06 DIAGNOSIS — Z113 Encounter for screening for infections with a predominantly sexual mode of transmission: Secondary | ICD-10-CM

## 2016-09-06 DIAGNOSIS — B20 Human immunodeficiency virus [HIV] disease: Secondary | ICD-10-CM

## 2016-09-06 LAB — COMPREHENSIVE METABOLIC PANEL
ALK PHOS: 72 U/L (ref 40–115)
ALT: 23 U/L (ref 9–46)
AST: 24 U/L (ref 10–35)
Albumin: 4.2 g/dL (ref 3.6–5.1)
BUN: 22 mg/dL (ref 7–25)
CALCIUM: 9.8 mg/dL (ref 8.6–10.3)
CO2: 25 mmol/L (ref 20–31)
Chloride: 104 mmol/L (ref 98–110)
Creat: 1.04 mg/dL (ref 0.70–1.33)
GLUCOSE: 75 mg/dL (ref 65–99)
POTASSIUM: 4.4 mmol/L (ref 3.5–5.3)
Sodium: 140 mmol/L (ref 135–146)
Total Bilirubin: 0.3 mg/dL (ref 0.2–1.2)
Total Protein: 7.2 g/dL (ref 6.1–8.1)

## 2016-09-06 LAB — CBC
HEMATOCRIT: 42.5 % (ref 38.5–50.0)
Hemoglobin: 14.8 g/dL (ref 13.2–17.1)
MCH: 31.2 pg (ref 27.0–33.0)
MCHC: 34.8 g/dL (ref 32.0–36.0)
MCV: 89.5 fL (ref 80.0–100.0)
MPV: 9 fL (ref 7.5–12.5)
Platelets: 327 10*3/uL (ref 140–400)
RBC: 4.75 MIL/uL (ref 4.20–5.80)
RDW: 14.5 % (ref 11.0–15.0)
WBC: 6.1 10*3/uL (ref 3.8–10.8)

## 2016-09-07 ENCOUNTER — Ambulatory Visit (INDEPENDENT_AMBULATORY_CARE_PROVIDER_SITE_OTHER): Payer: Medicare HMO | Admitting: Internal Medicine

## 2016-09-07 ENCOUNTER — Encounter: Payer: Self-pay | Admitting: Internal Medicine

## 2016-09-07 VITALS — BP 152/80 | HR 90 | Temp 98.3°F | Ht 64.0 in | Wt 178.1 lb

## 2016-09-07 DIAGNOSIS — Z79899 Other long term (current) drug therapy: Secondary | ICD-10-CM | POA: Diagnosis not present

## 2016-09-07 DIAGNOSIS — I2510 Atherosclerotic heart disease of native coronary artery without angina pectoris: Secondary | ICD-10-CM | POA: Diagnosis not present

## 2016-09-07 DIAGNOSIS — I10 Essential (primary) hypertension: Secondary | ICD-10-CM

## 2016-09-07 DIAGNOSIS — M10072 Idiopathic gout, left ankle and foot: Secondary | ICD-10-CM

## 2016-09-07 DIAGNOSIS — M109 Gout, unspecified: Secondary | ICD-10-CM

## 2016-09-07 DIAGNOSIS — Z87891 Personal history of nicotine dependence: Secondary | ICD-10-CM

## 2016-09-07 DIAGNOSIS — M10071 Idiopathic gout, right ankle and foot: Secondary | ICD-10-CM | POA: Diagnosis not present

## 2016-09-07 LAB — T-HELPER CELL (CD4) - (RCID CLINIC ONLY)
CD4 T CELL ABS: 820 /uL (ref 400–2700)
CD4 T CELL HELPER: 27 % — AB (ref 33–55)

## 2016-09-07 LAB — RPR

## 2016-09-07 LAB — URINE CYTOLOGY ANCILLARY ONLY
Chlamydia: NEGATIVE
NEISSERIA GONORRHEA: NEGATIVE

## 2016-09-07 MED ORDER — METOPROLOL SUCCINATE ER 100 MG PO TB24: 100.0000 mg | ORAL_TABLET | Freq: Every day | ORAL | 2 refills | Status: DC

## 2016-09-07 NOTE — Assessment & Plan Note (Addendum)
Doing well on colcichine and allopurinol. No further attacks. UA which was 6.5 today (goal <6)  Change allopurinol to 150mg  daily to further lower his UA. Cont colcichine. I would cont colcichine 6 months after UA <6.0

## 2016-09-07 NOTE — Progress Notes (Signed)
   CC: HTN and gout follow up   HPI:  [name] is a 56 y.o. with pmh as listed below is here for f/up of HTN  Last seen Dr. Quay Burow on 2/16, currently on losartan 50mg  daily. Was switched from lisinopril to losartan due to his gout prior to last visit but did not start taking losartan until last visit. Also on amlodipine 5 mg daily. Was supposed to be on atenolol for his CAD but not taking it currently.   Gout: had several gout attacks in the past in his ankles and feet, last attack 07/21/16, had overconsumption of red bead and alcohol. Last visit he was started on allopurinol 100mg  daily with colchichine 0.6mg  daily. Last uric acid 12/2015 12.0   Past Medical History:  Diagnosis Date  . Anemia   . Arthritis    "feet" (11/19/2015)  . CAD (coronary artery disease), native coronary artery    a. remote LAD stent 1990s. b. s/p DESx2 to LAD 11/2015  . Hepatitis C    "going thru the treatments" (11/19/2015)  . High cholesterol   . History of gout    "just recently" (11/19/2015)  . HIV (human immunodeficiency virus infection) ([place])   . Hypertension   . Type II diabetes mellitus ([place])    diagnosed 04-2015    Review of Systems:    Review of Systems  Eyes: Negative for blurred vision and double vision.  Gastrointestinal: Negative for heartburn, nausea and vomiting.  Genitourinary: Negative for dysuria.  Neurological: Negative for dizziness, tingling and headaches.  Psychiatric/Behavioral: Negative for depression.     Physical Exam:  Vitals:   09/07/16 1405  BP: (!) 152/80  Pulse: 90  Temp: 98.3 F (36.8 C)  TempSrc: Oral  SpO2: 100%  Weight: 178 lb 1.6 oz (80.8 kg)  Height: 5\' 4"  (1.626 m)   Physical Exam  Constitutional: He is oriented to person, place, and time. He appears well-developed and well-nourished. No distress.  Cardiovascular: Normal rate and regular rhythm.  Exam reveals no gallop and no friction rub.   No murmur heard. Respiratory: Effort normal and  breath sounds normal. No respiratory distress. He has no wheezes.  GI: Soft. Bowel sounds are normal.  Musculoskeletal: Normal range of motion. He exhibits no edema or deformity.  Neurological: He is alert and oriented to person, place, and time.  Skin: He is not diaphoretic.    Assessment & Plan:   See Encounters Tab for problem based charting.  Patient discussed with Dr. Angelia Mould

## 2016-09-07 NOTE — Assessment & Plan Note (Addendum)
Vitals:   09/07/16 1405  BP: (!) 152/80  Pulse: 90  Temp: 98.3 F (36.8 C)   Cont losartan 50mg  daily and amlodipine 5 mg daily. He is not a bblocker but he should be one for his CAD. Was on atenolol in the past. Will start metoprolol succinate 100mg  daily.  f/up in 3 months.

## 2016-09-07 NOTE — Patient Instructions (Signed)
Continue losartan 50mg  daily and amlodipine 5 mg daily for your BP. We will add metoprolol 100mg  daily for your blood pressure and heart.   We will check your blood work today.  Follow up in 3 months.

## 2016-09-08 LAB — HIV-1 RNA QUANT-NO REFLEX-BLD
HIV 1 RNA Quant: <20 copies/mL
HIV-1 RNA Quant, Log: <1.3 Log copies/mL

## 2016-09-08 LAB — URIC ACID: Uric Acid: 6.5 mg/dL (ref 3.7–8.6)

## 2016-09-08 LAB — HEPATITIS C RNA QUANTITATIVE
HCV QUANT: NOT DETECTED [IU]/mL
HCV Quantitative Log: <1.18 Log IU/mL

## 2016-09-08 MED ORDER — ALLOPURINOL 100 MG PO TABS: 150.0000 mg | ORAL_TABLET | Freq: Every day | ORAL | 3 refills | Status: DC

## 2016-09-08 NOTE — Progress Notes (Signed)
Internal Medicine Clinic Attending  Case discussed with Dr. Ahmed at the time of the visit.  We reviewed the resident's history and exam and pertinent patient test results.  I agree with the assessment, diagnosis, and plan of care documented in the resident's note. 

## 2016-09-20 ENCOUNTER — Ambulatory Visit: Payer: Medicare HMO | Admitting: Infectious Diseases

## 2016-09-21 ENCOUNTER — Other Ambulatory Visit: Payer: Self-pay | Admitting: Internal Medicine

## 2016-09-21 DIAGNOSIS — I10 Essential (primary) hypertension: Secondary | ICD-10-CM

## 2016-09-21 DIAGNOSIS — M79671 Pain in right foot: Secondary | ICD-10-CM

## 2016-09-25 ENCOUNTER — Other Ambulatory Visit: Payer: Self-pay

## 2016-09-26 ENCOUNTER — Other Ambulatory Visit: Payer: Self-pay | Admitting: Internal Medicine

## 2016-09-26 DIAGNOSIS — I10 Essential (primary) hypertension: Secondary | ICD-10-CM

## 2016-11-20 ENCOUNTER — Ambulatory Visit (INDEPENDENT_AMBULATORY_CARE_PROVIDER_SITE_OTHER): Payer: Medicare HMO | Admitting: Infectious Diseases

## 2016-11-20 ENCOUNTER — Encounter: Payer: Self-pay | Admitting: Infectious Diseases

## 2016-11-20 VITALS — BP 167/91 | HR 86 | Temp 98.0°F | Ht 64.0 in | Wt 184.0 lb

## 2016-11-20 DIAGNOSIS — Z959 Presence of cardiac and vascular implant and graft, unspecified: Secondary | ICD-10-CM | POA: Diagnosis not present

## 2016-11-20 DIAGNOSIS — Z113 Encounter for screening for infections with a predominantly sexual mode of transmission: Secondary | ICD-10-CM

## 2016-11-20 DIAGNOSIS — Z789 Other specified health status: Secondary | ICD-10-CM

## 2016-11-20 DIAGNOSIS — B20 Human immunodeficiency virus [HIV] disease: Secondary | ICD-10-CM | POA: Diagnosis not present

## 2016-11-20 DIAGNOSIS — Z95820 Peripheral vascular angioplasty status with implants and grafts: Secondary | ICD-10-CM

## 2016-11-20 DIAGNOSIS — E781 Pure hyperglyceridemia: Secondary | ICD-10-CM

## 2016-11-20 HISTORY — DX: Other specified health status: Z78.9

## 2016-11-20 MED ORDER — BICTEGRAVIR-EMTRICITAB-TENOFOV 50-200-25 MG PO TABS: 1.0000 | ORAL_TABLET | Freq: Every day | ORAL | 3 refills | Status: DC

## 2016-11-20 NOTE — Progress Notes (Signed)
   Subjective:    Patient ID: John Dickerson, male    DOB: 08-17-1960, 56 y.o.   MRN: [id number]  HPI 56yo M with hx of HIV+ was Dx 1998, DM2, and HTN. Hep C 1a. 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  He was on atripla til changed to [place] for Hep C rx.  Previously Hep A/B (S Ab)+. He was started on zepatier/sovaldi/rib (plan 16 weeks) on 2-25 after he took harvoni for short course. His ART was changed to ISN/Desc at that time.  Repeat Hep C RNA negative ( 7-17 and 3-18).    FSG 118-130. No numbness or tingling. Cataract in L eye. No ophto this year.  Continues on ISN-Desc.   HIV 1 RNA Quant (copies/mL)  Date Value  09/06/2016 <20 NOT DETECTED  03/22/2016 <20  09/02/2015 61 (H)   CD4 T Cell Abs (/uL)  Date Value  09/06/2016 820  03/22/2016 550  04/22/2015 540    Review of Systems  Constitutional: Negative for appetite change and unexpected weight change.  Eyes: Positive for visual disturbance.  Respiratory: Negative for cough and shortness of breath.   Cardiovascular: Positive for chest pain and leg swelling.  Gastrointestinal: Negative for constipation and diarrhea.  Genitourinary: Negative for difficulty urinating.  Neurological: Negative for numbness and headaches.   Exertional CP. Has CV appt coming.     Objective:   Physical Exam  Constitutional: He appears well-developed and well-nourished.  HENT:  Mouth/Throat: No oropharyngeal exudate.  Eyes: EOM are normal. Pupils are equal, round, and reactive to light.  Neck: Neck supple.  Cardiovascular: Normal rate, regular rhythm and normal heart sounds.   Pulmonary/Chest: Effort normal and breath sounds normal.  Abdominal: Soft. Bowel sounds are normal. There is no tenderness. There is no rebound.  Musculoskeletal: He exhibits edema.  Lymphadenopathy:    He has no cervical adenopathy.   Trace LE edema    Assessment & Plan:

## 2016-11-20 NOTE — Assessment & Plan Note (Signed)
Will add RPR to his next lab, o/w not check labs today.  He is doing well Will check his meds to [place] (biktarvy).  Will see him back in 6 months.  Offered/refused condoms.

## 2016-11-20 NOTE — Assessment & Plan Note (Signed)
Will continue to follow lipids Will ask PCP to check his lipds at f/u

## 2016-11-20 NOTE — Assessment & Plan Note (Signed)
He has CV f/u in July.  Confirmed with pt.

## 2016-11-22 ENCOUNTER — Other Ambulatory Visit: Payer: Self-pay | Admitting: Internal Medicine

## 2016-11-22 DIAGNOSIS — M79671 Pain in right foot: Secondary | ICD-10-CM

## 2016-11-29 ENCOUNTER — Other Ambulatory Visit: Payer: Self-pay

## 2016-11-29 MED ORDER — CLOPIDOGREL BISULFATE 75 MG PO TABS: 75.0000 mg | ORAL_TABLET | Freq: Every day | ORAL | 1 refills | Status: DC

## 2016-11-29 NOTE — Telephone Encounter (Signed)
  clopidogrel (PLAVIX) 75 MG tablet, refill request.

## 2016-12-01 ENCOUNTER — Ambulatory Visit (INDEPENDENT_AMBULATORY_CARE_PROVIDER_SITE_OTHER): Payer: Medicare HMO | Admitting: Internal Medicine

## 2016-12-01 ENCOUNTER — Encounter: Payer: Self-pay | Admitting: Internal Medicine

## 2016-12-01 VITALS — BP 142/68 | HR 78 | Temp 98.0°F | Ht 64.0 in | Wt 186.7 lb

## 2016-12-01 DIAGNOSIS — Z7984 Long term (current) use of oral hypoglycemic drugs: Secondary | ICD-10-CM | POA: Diagnosis not present

## 2016-12-01 DIAGNOSIS — I10 Essential (primary) hypertension: Secondary | ICD-10-CM

## 2016-12-01 DIAGNOSIS — Z87891 Personal history of nicotine dependence: Secondary | ICD-10-CM | POA: Diagnosis not present

## 2016-12-01 DIAGNOSIS — I2510 Atherosclerotic heart disease of native coronary artery without angina pectoris: Secondary | ICD-10-CM

## 2016-12-01 DIAGNOSIS — Z79899 Other long term (current) drug therapy: Secondary | ICD-10-CM

## 2016-12-01 DIAGNOSIS — N521 Erectile dysfunction due to diseases classified elsewhere: Secondary | ICD-10-CM | POA: Diagnosis not present

## 2016-12-01 DIAGNOSIS — E1159 Type 2 diabetes mellitus with other circulatory complications: Secondary | ICD-10-CM | POA: Diagnosis not present

## 2016-12-01 LAB — POCT GLYCOSYLATED HEMOGLOBIN (HGB A1C): HEMOGLOBIN A1C: 5.6

## 2016-12-01 LAB — GLUCOSE, CAPILLARY: Glucose-Capillary: 123 mg/dL — ABNORMAL HIGH (ref 65–99)

## 2016-12-01 MED ORDER — AMLODIPINE BESYLATE 10 MG PO TABS: 10.0000 mg | ORAL_TABLET | Freq: Every day | ORAL | 3 refills | Status: DC

## 2016-12-01 NOTE — Patient Instructions (Signed)
It was a pleasure to see you today John Dickerson.  You are doing a fantastic job with your diabetes. Keep up the good job with your diet and taking medicine.  For your blood pressure I recommend doubling the dose of your amlodipine. You can take 2 of the current pills and if tolerating that okay will switch to a stronger prescription. Please call us if you have any trouble with this and can go back to the previous dose.

## 2016-12-01 NOTE — Progress Notes (Signed)
   CC: Follow up for hypertension and diabetes  HPI:  JohnJohn Dickerson is a 56 y.o. man here for follow up of his hypertension and diabetes.   See problem based assessment and plan below for additional details  Past Medical History:  Diagnosis Date  . Anemia   . Arthritis    "feet" (11/19/2015)  . CAD (coronary artery disease), native coronary artery    a. remote LAD stent 1990s. b. s/p DESx2 to LAD 11/2015  . Hepatitis C    "going thru the treatments" (11/19/2015)  . High cholesterol   . History of gout    "just recently" (11/19/2015)  . HIV (human immunodeficiency virus infection) ([place])   . Hypertension   . Type II diabetes mellitus ([place])    diagnosed 04-2015    Review of Systems:  Review of Systems  Constitutional: Negative for chills and fever.  Respiratory: Negative for shortness of breath.   Cardiovascular: Positive for leg swelling. Negative for chest pain.  Neurological: Negative for dizziness and headaches.    Physical Exam: Physical Exam  Constitutional: He is well-developed, well-nourished, and in no distress.  Eyes: Conjunctivae are normal.  Cardiovascular: Normal rate and regular rhythm.   Pulmonary/Chest: Effort normal and breath sounds normal.  Musculoskeletal:  Trace ankle edema bilaterally  Lymphadenopathy:    He has no cervical adenopathy.    Vitals:   12/01/16 1348  BP: (!) 142/68  Pulse: 78  Temp: 98 F (36.7 C)  TempSrc: Oral  SpO2: 100%  Weight: 186 lb 11.2 oz (84.7 kg)  Height: 5\' 4"  (1.626 m)    Assessment & Plan:   See Encounters Tab for problem based charting.  Patient discussed with Dr. Lynnae January

## 2016-12-04 ENCOUNTER — Other Ambulatory Visit: Payer: Self-pay | Admitting: Internal Medicine

## 2016-12-04 DIAGNOSIS — I10 Essential (primary) hypertension: Secondary | ICD-10-CM

## 2016-12-04 NOTE — Assessment & Plan Note (Signed)
HPI: He is feeling well today. HE does not report any signs or symptoms of hypoglycemia. He does not check home CBGs with his current regimen on metformin and glipizide. Hgb A1c is 5.6% today.  A: Well controlled type 2 diabetes with goal Hgb A1c <7.0% He feels well and thinks he would like to continue the current medication plan. He expressed good understanding about watching for signs of hypoglycemia. We could certainly decrease medications if there are any problems.  P: Continue metformin 1000 mg twice a day and glipizide 5 mg daily

## 2016-12-04 NOTE — Assessment & Plan Note (Addendum)
HPI: Blood pressure mildly elevated at 142/68 today. No overt symptoms. He is on amlodipine 5mg  and losartan 50mg  for his blood pressure. He was started on metoprolol a few months ago for his history of CAD and angina without much effect on blood pressure. He has previously taken amlodipine 10mg  per chart review but was decreased due to good BP control without this.  A: Mildly uncontrolled hypertension  P: Increase amlodipine to 10mg  daily

## 2016-12-04 NOTE — Progress Notes (Signed)
Internal Medicine Clinic Attending  Case discussed with Dr. Rice at the time of the visit.  We reviewed the resident's history and exam and pertinent patient test results.  I agree with the assessment, diagnosis, and plan of care documented in the resident's note.  

## 2016-12-12 ENCOUNTER — Telehealth: Payer: Self-pay | Admitting: Dietician

## 2016-12-12 DIAGNOSIS — E1159 Type 2 diabetes mellitus with other circulatory complications: Secondary | ICD-10-CM

## 2016-12-12 DIAGNOSIS — N521 Erectile dysfunction due to diseases classified elsewhere: Principal | ICD-10-CM

## 2016-12-12 NOTE — Telephone Encounter (Signed)
Calling to follow up on DSMT. It is time for his annual review.

## 2016-12-18 ENCOUNTER — Other Ambulatory Visit: Payer: Self-pay | Admitting: Internal Medicine

## 2016-12-18 DIAGNOSIS — I10 Essential (primary) hypertension: Secondary | ICD-10-CM

## 2016-12-18 DIAGNOSIS — M79671 Pain in right foot: Secondary | ICD-10-CM

## 2017-01-01 ENCOUNTER — Encounter (INDEPENDENT_AMBULATORY_CARE_PROVIDER_SITE_OTHER): Payer: Self-pay

## 2017-01-01 ENCOUNTER — Ambulatory Visit (INDEPENDENT_AMBULATORY_CARE_PROVIDER_SITE_OTHER): Payer: Medicare HMO | Admitting: Interventional Cardiology

## 2017-01-01 ENCOUNTER — Encounter: Payer: Self-pay | Admitting: Interventional Cardiology

## 2017-01-01 VITALS — BP 160/100 | HR 72 | Ht 64.0 in | Wt 186.0 lb

## 2017-01-01 DIAGNOSIS — E782 Mixed hyperlipidemia: Secondary | ICD-10-CM

## 2017-01-01 DIAGNOSIS — R0609 Other forms of dyspnea: Secondary | ICD-10-CM | POA: Diagnosis not present

## 2017-01-01 DIAGNOSIS — Z95820 Peripheral vascular angioplasty status with implants and grafts: Secondary | ICD-10-CM

## 2017-01-01 DIAGNOSIS — Z959 Presence of cardiac and vascular implant and graft, unspecified: Secondary | ICD-10-CM | POA: Diagnosis not present

## 2017-01-01 DIAGNOSIS — I2510 Atherosclerotic heart disease of native coronary artery without angina pectoris: Secondary | ICD-10-CM | POA: Diagnosis not present

## 2017-01-01 DIAGNOSIS — I10 Essential (primary) hypertension: Secondary | ICD-10-CM

## 2017-01-01 MED ORDER — LOSARTAN POTASSIUM 100 MG PO TABS: 100.0000 mg | ORAL_TABLET | Freq: Every day | ORAL | 3 refills | Status: DC

## 2017-01-01 NOTE — Patient Instructions (Signed)
Medication Instructions:  Your physician has recommended you make the following change in your medication:   INCREASE losartan to 100 mg daily  Labwork: Your physician recommends that you return for lab work in: 1-2 weeks for BMET (same day as HTN Clinic Appointment)   Testing/Procedures: None ordered  Follow-Up: Your physician recommends that you schedule a follow-up appointment in: 1-2 weeks in the Hypertension Clinic  Your physician recommends that you schedule a follow-up appointment in: 4 months with Dr. Irish Lack     Any Other Special Instructions Will Be Listed Below (If Applicable).     If you need a refill on your cardiac medications before your next appointment, please call your pharmacy.

## 2017-01-01 NOTE — Progress Notes (Signed)
Cardiology Office Note   Date:  01/01/2017   ID:  [name], DOB 1960-09-19, MRN [id number]  PCP:  John Salina, MD    Chief Complaint  Patient presents with  . Follow-up  CAD   Wt Readings from Last 3 Encounters:  01/01/17 186 lb (84.4 kg)  12/01/16 186 lb 11.2 oz (84.7 kg)  11/20/16 184 lb (83.5 kg)       History of Present Illness: John Dickerson is a 56 y.o. male  with history of CAD (remote LAD stent 1990s, s/p DESx2 to LAD 11/2015) HIV, DM, HTN, HLD, gout, anemia.    Outpatient cath was done in June 2017 demonstrating 80% mid LAD (in area previously treated with a stent) and 80% distal LAD, which were both treated with DES. He had residual nonobstructive disease otherwise - 50% ramus, 80% OM1, 70% ost LPDA, 50% mid-distal LAD. LVEF was normal. He was started on Plavix. Dr. Haroldine Dickerson discussed statin therapy with ID who cleared the patient for atorvastatin 42m daily. LFTs were normal 12/29/15. LDL in 10/2015 was 99. Of note, Hgb in 2016 was 15-16. In 2017 it has run in the 9-11 range.  Denies : Dizziness. Leg edema. Nitroglycerin use. Orthopnea. Palpitations. Paroxysmal nocturnal dyspnea. Shortness of breath. Syncope.   He swims, does the treadmill at the gym 2-3 x/week.  He can do the treadmill for an hour without any chest pain.  Prior to his PCI, he could not do the treadmill.  He does a few laps in the pool, and has no chest pain.  Only with mowing the lawn, he feels some SHOB that lasts just a few seconds.  Worse if he is rolling the mower up the hill.  He has not used NTG.  It is different than his prior angina.  BP has been high at home.  Readings about 152/80.        Past Medical History:  Diagnosis Date  . Anemia   . Arthritis    "feet" (11/19/2015)  . CAD (coronary artery disease), native coronary artery    a. remote LAD stent 1990s. b. s/p DESx2 to LAD 11/2015  . Hepatitis C    "going thru the treatments" (11/19/2015)  . High  cholesterol   . History of gout    "just recently" (11/19/2015)  . HIV (human immunodeficiency virus infection) ([place]   . Hypertension   . Type II diabetes mellitus ([place]    diagnosed 04-2015    Past Surgical History:  Procedure Laterality Date  . CARDIAC CATHETERIZATION N/A 11/19/2015   Procedure: Left Heart Cath and Coronary Angiography;  Surgeon: DJolaine Artist MD;  [facility];  Service: Cardiovascular;  Laterality: N/A;  . CARDIAC CATHETERIZATION N/A 11/19/2015   Procedure: Coronary Stent Intervention;  Surgeon: JJettie Booze MD;  [facility];  Service: Cardiovascular;  Laterality: N/A;  . CORONARY ANGIOPLASTY WITH STENT PLACEMENT  11/19/2015   "2 stents"     Current Outpatient Prescriptions  Medication Sig Dispense Refill  . allopurinol (ZYLOPRIM) 100 MG tablet Take 1.5 tablets (150 mg total) by mouth daily. 90 tablet 3  . amLODipine (NORVASC) 10 MG tablet Take 1 tablet (10 mg total) by mouth daily. 30 tablet 3  . aspirin EC 81 MG tablet Take 1 tablet (81 mg total) by mouth daily. 90 tablet 3  . bictegravir-emtricitabine-tenofovir AF (BIKTARVY) 50-200-25 MG TABS tablet Take 1 tablet by mouth daily. 90 tablet 3  .  blood glucose meter kit and supplies KIT Dispense based on patient and insurance preference. Use up to four times daily as directed. (FOR ICD-9 250.00, 250.01). 1 each 0  . Blood Glucose Monitoring Suppl ([place]) w/Device KIT 1 each by Does not apply route daily. 1 kit 0  . clopidogrel (PLAVIX) 75 MG tablet Take 1 tablet (75 mg total) by mouth daily. 90 tablet 1  . glipiZIDE (GLUCOTROL) 5 MG tablet TAKE 1 TABLET BY MOUTH EVERY MORNING BEFORE BREAKFAST 30 tablet 2  . glucose blood (ONETOUCH VERIO) test strip Check blood sugar up to 1 time a day 100 each 12  . losartan (COZAAR) 50 MG tablet TAKE 1 TABLET(50 MG) BY MOUTH DAILY 90 tablet 0  . metFORMIN (GLUCOPHAGE) 1000 MG tablet Take 1 tablet (1,000 mg total) by  mouth 2 (two) times daily with a meal. 180 tablet 3  . nitroGLYCERIN (NITROSTAT) 0.4 MG SL tablet Place 1 tablet (0.4 mg total) under the tongue every 5 (five) minutes as needed for chest pain. 25 tablet 3  . ONETOUCH DELICA LANCETS FINE MISC Check blood sugar up to 1 time a day 100 each 12  . VOLTAREN 1 % GEL APPLY 4 GRAMS TO THE AFFECTED AREA FOUR TIMES DAILY 100 g 0   No current facility-administered medications for this visit.     Allergies:   Patient has no known allergies.    Social History:  The patient  reports that he quit smoking about 22 months ago. His smoking use included Cigarettes. He started smoking about 4 years ago. He has a 5.88 pack-year smoking history. He has never used smokeless tobacco. He reports that he does not drink alcohol or use drugs.   Family History:  The patient's family history includes Cancer in his mother; Diabetes in his mother; Hypertension in his father.    ROS:  Please see the history of present illness.   Otherwise, review of systems are positive for occasional DOE.   All other systems are reviewed and negative.    PHYSICAL EXAM: VS:  BP (!) 160/100   Pulse 72   Ht '5\' 4"'  (1.626 m)   Wt 186 lb (84.4 kg)   BMI 31.93 kg/m  , BMI Body mass index is 31.93 kg/m. GEN: Well nourished, well developed, in no acute distress  HEENT: normal  Neck: no JVD, carotid bruits, or masses Cardiac: RRR; no murmurs, rubs, or gallops,; trivial ankle edema  Respiratory:  clear to auscultation bilaterally, normal work of breathing GI: soft, nontender, nondistended, + BS MS: no deformity or atrophy  Skin: warm and dry, no rash Neuro:  Strength and sensation are intact Psych: euthymic mood, full affect   EKG:   The ekg ordered today demonstrates NSR, anterior ST changes- chronic; inferior and  lateral ST depressions.   Recent Labs: 09/06/2016: ALT 23; BUN 22; Creat 1.04; Hemoglobin 14.8; Platelets 327; Potassium 4.4; Sodium 140   Lipid Panel    Component  Value Date/Time   CHOL 130 03/22/2016 1155   CHOL 158 11/03/2015 1013   TRIG 47 03/22/2016 1155   HDL 41 03/22/2016 1155   HDL 39 (L) 11/03/2015 1013   CHOLHDL 3.2 03/22/2016 1155   VLDL 9 03/22/2016 1155   LDLCALC 80 03/22/2016 1155   LDLCALC 99 11/03/2015 1013     Other studies Reviewed: Additional studies/ records that were reviewed today with results demonstrating: cardiac cath results as above.  LDL controlled in 10/17.   ASSESSMENT AND PLAN:  1. CAD: Status post LAD PCI in 2017. He does have residual coronary artery disease that is being managed medically. No clear angina on current medical therapy. He is not having symptoms like he had before his stents were placed. He will let us know if this changes.  He does have some dyspnea on exertion with going up hills, but this is not close to the symptoms he had prior to his stents. 2. Hyperlipidemia: Lipids well controlled in 2017. 3. HTN: Increase Cozaar to 100 mg daily.  Check BMet in a week.  Follow-up in hypertension clinic in 1-2 weeks. We'll need to make sure that there are no drug interactions. 4. HIV: Followed by Dr. Johnnye Sima.  Viral load undetectable per his report.    Current medicines are reviewed at length with the patient today.  The patient concerns regarding his medicines were addressed.  The following changes have been made:  Increase Cozaar to 100 mg  Labs/ tests ordered today include: BMet No orders of the defined types were placed in this encounter.   Recommend 150 minutes/week of aerobic exercise Low fat, low carb, high fiber diet recommended  Disposition:   FU in 4 months   Signed, Larae Grooms, MD  01/01/2017 9:12 AM    [facility], [place], [place]  [address] Phone: [phone]; Fax: [phone]

## 2017-01-15 NOTE — Progress Notes (Signed)
Patient ID: MARCELLIUS Dickerson                 DOB: 30-Jun-1960                      MRN: [id number]     HPI: John Dickerson is a 56 y.o. male patient of Dr. Irish Dickerson who presents today for hypertension evaluation. PMH includes CAD (remote LAD stent 1990s, s/p DESx2 to LAD 11/2015) HIV, DM, HTN, HLD, gout, and anemia. He was recently seen by Dr. Irish Dickerson and his losartan was increased due to elevated pressure to 160/100.   He presents today for BMET and blood pressure check.   He reports that he ate a cheeseburger last night with fries and his pressure is likely elevated due to that. He states he has started to notice a trend with salty/fatty foods and his pressure being up the following day.  He also reports he stopped amlodipine about a week ago as he ran out. Also of note he has losartan 182m and 566mhand outs in his stack of current medications, though he believes he is only taking 10073maily - losartan 30m88ms marked with X and he was advised to put aside if he did have any tablets remaining.   [facility]   Today he denies headache, chest pain, SOB and states he feels well. He reports better pressures at home (even reporting systolic pressures less than 100), without symptoms of low pressure.   Current HTN meds:  Amlodipine 10mg40mly - he ran out last week of amlodipine Losartan 100mg 5my   BP goal: <130/80  Family History: Cancer in his mother; Diabetes in his mother; Hypertension in his father.   Social History: The patient  reports that he quit smoking about 22 months ago. His smoking use included Cigarettes. He started smoking about 4 years ago. He has a 5.88 pack-year smoking history. He has never used smokeless tobacco. He reports that he does not drink alcohol or use drugs  Diet: Combination of eating in and out. Not too much fast food. Occasionally add salt. He does drink sweet tea  occasionally.   Exercise: Goes to gym three times a week. He swims 10 minutes then treadmill for 1 hour and weights for about 20 minutes.   Home BP readings: does have a blood pressure cuff, but cannot recall numbers, though he states he does have his top number down below 100 sometimes.   Wt Readings from Last 3 Encounters:  01/01/17 186 lb (84.4 kg)  12/01/16 186 lb 11.2 oz (84.7 kg)  11/20/16 184 lb (83.5 kg)   BP Readings from Last 3 Encounters:  01/16/17 (!) 164/102  01/01/17 (!) 160/100  12/01/16 (!) 142/68   Pulse Readings from Last 3 Encounters:  01/16/17 68  01/01/17 72  12/01/16 78    Renal function: CrCl cannot be calculated (Patient's most recent lab result is older than the maximum 21 days allowed.).  Past Medical History:  Diagnosis Date  . Anemia   . Arthritis    "feet" (11/19/2015)  . CAD (coronary artery disease), native coronary artery    a. remote LAD stent 1990s. b. s/p DESx2 to LAD 11/2015  . Hepatitis C    "going thru the treatments" (11/19/2015)  . High cholesterol   . History of gout    "just recently" (11/19/2015)  . HIV (  human immunodeficiency virus infection) ([facility])   . Hypertension   . Type II diabetes mellitus ([facility])    diagnosed 04-2015    Current Outpatient Prescriptions on File Prior to Visit  Medication Sig Dispense Refill  . amLODipine (NORVASC) 10 MG tablet Take 1 tablet (10 mg total) by mouth daily. 30 tablet 3  . aspirin EC 81 MG tablet Take 1 tablet (81 mg total) by mouth daily. 90 tablet 3  . bictegravir-emtricitabine-tenofovir AF (BIKTARVY) 50-200-25 MG TABS tablet Take 1 tablet by mouth daily. 90 tablet 3  . blood glucose meter kit and supplies KIT Dispense based on patient and insurance preference. Use up to four times daily as directed. (FOR ICD-9 250.00, 250.01). 1 each 0  . Blood Glucose Monitoring Suppl (ONETOUCH VERIO FLEX SYSTEM) w/Device KIT 1 each by Does not apply route daily. 1 kit 0  . clopidogrel (PLAVIX) 75 MG tablet Take  1 tablet (75 mg total) by mouth daily. 90 tablet 1  . glipiZIDE (GLUCOTROL) 5 MG tablet TAKE 1 TABLET BY MOUTH EVERY MORNING BEFORE BREAKFAST 30 tablet 2  . glucose blood (ONETOUCH VERIO) test strip Check blood sugar up to 1 time a day 100 each 12  . losartan (COZAAR) 100 MG tablet Take 1 tablet (100 mg total) by mouth daily. 90 tablet 3  . metFORMIN (GLUCOPHAGE) 1000 MG tablet Take 1 tablet (1,000 mg total) by mouth 2 (two) times daily with a meal. 180 tablet 3  . ONETOUCH DELICA LANCETS FINE MISC Check blood sugar up to 1 time a day 100 each 12  . VOLTAREN 1 % GEL APPLY 4 GRAMS TO THE AFFECTED AREA FOUR TIMES DAILY 100 g 0  . nitroGLYCERIN (NITROSTAT) 0.4 MG SL tablet Place 1 tablet (0.4 mg total) under the tongue every 5 (five) minutes as needed for chest pain. (Patient not taking: Reported on 01/16/2017) 25 tablet 3   No current facility-administered medications on file prior to visit.     No Known Allergies  Blood pressure (!) 164/102, pulse 68.   Assessment/Plan: Hypertension: BMET today. There are no drug interactions with current regimen. His pressure is not controlled likely secondary to medication noncompliance. Will restart amlodipine today. He will likely require additional medication management given his elevated pressures. Could consider change to irbesartan (more potent ARB) - though losartan does decrease uric acid (reduce gout flares, for which he recently). More likely would add chlorthalidone which also does not interact with his HIV regimen. Have asked he bring log and cuff with him to follow up in 4 weeks.   Thank you, Kelley M. Auten, PharmD  [facility]  01/16/2017 8:47 AM    

## 2017-01-16 ENCOUNTER — Other Ambulatory Visit: Payer: Medicare HMO

## 2017-01-16 ENCOUNTER — Ambulatory Visit (INDEPENDENT_AMBULATORY_CARE_PROVIDER_SITE_OTHER): Payer: Medicare HMO | Admitting: Pharmacist

## 2017-01-16 ENCOUNTER — Encounter: Payer: Self-pay | Admitting: Pharmacist

## 2017-01-16 VITALS — BP 164/102 | HR 68

## 2017-01-16 DIAGNOSIS — I10 Essential (primary) hypertension: Secondary | ICD-10-CM | POA: Diagnosis not present

## 2017-01-16 DIAGNOSIS — I2510 Atherosclerotic heart disease of native coronary artery without angina pectoris: Secondary | ICD-10-CM

## 2017-01-16 LAB — BASIC METABOLIC PANEL
BUN/Creatinine Ratio: 18 (ref 9–20)
BUN: 21 mg/dL (ref 6–24)
CALCIUM: 9.8 mg/dL (ref 8.7–10.2)
CO2: 22 mmol/L (ref 20–29)
CREATININE: 1.19 mg/dL (ref 0.76–1.27)
Chloride: 104 mmol/L (ref 96–106)
GFR calc Af Amer: 77 mL/min/{1.73_m2} (ref >59)
GFR, EST NON AFRICAN AMERICAN: 67 mL/min/{1.73_m2} (ref >59)
GLUCOSE: 74 mg/dL (ref 65–99)
Potassium: 4.8 mmol/L (ref 3.5–5.2)
Sodium: 142 mmol/L (ref 134–144)

## 2017-01-16 MED ORDER — AMLODIPINE BESYLATE 10 MG PO TABS: 10.0000 mg | ORAL_TABLET | Freq: Every day | ORAL | 0 refills | Status: DC

## 2017-01-16 NOTE — Patient Instructions (Signed)
Return for a follow up appointment in 4 weeks  Check your blood pressure at home daily (if able) and keep record of the readings.  Take your BP meds as follows: RESTART amlodipine 10mg  daily  CONTINUE losartan 100mg  daily   TAKE out prescription with X on paper and put aside   Bring all of your meds, your BP cuff and your record of home blood pressures to your next appointment.  Exercise as you're able, try to walk approximately 30 minutes per day.  Keep salt intake to a minimum, especially watch canned and prepared boxed foods.  Eat more fresh fruits and vegetables and fewer canned items.  Avoid eating in fast food restaurants.    HOW TO TAKE YOUR BLOOD PRESSURE: . Rest 5 minutes before taking your blood pressure. .  Don't smoke or drink caffeinated beverages for at least 30 minutes before. . Take your blood pressure before (not after) you eat. . Sit comfortably with your back supported and both feet on the floor (don't cross your legs). . Elevate your arm to heart level on a table or a desk. . Use the proper sized cuff. It should fit smoothly and snugly around your bare upper arm. There should be enough room to slip a fingertip under the cuff. The bottom edge of the cuff should be 1 inch above the crease of the elbow. . Ideally, take 3 measurements at one sitting and record the average.

## 2017-02-12 ENCOUNTER — Other Ambulatory Visit: Payer: Self-pay | Admitting: Interventional Cardiology

## 2017-02-12 DIAGNOSIS — I10 Essential (primary) hypertension: Secondary | ICD-10-CM

## 2017-02-12 NOTE — Progress Notes (Signed)
Patient ID: John Dickerson                 DOB: 11/09/1960                      MRN: [id number]     HPI: John Dickerson is a 56 y.o. male patient of Dr. Irish Lack who presents today for hypertension follow up. PMH includes CAD (remote LAD stent 1990s, s/p DESx2 to LAD 11/2015) HIV, DM, HTN, HLD, gout, and anemia. He was recently seen by Dr. Irish Lack and his losartan was increased due to elevated pressure to 160/100. At his most recent HTN clinic visit on 7/31, he reported that he stopped amlodipine 10 mg daily a week prior because he ran out of the medication. Pt's BP was elevated in clinic. Amlodipine 10 mg was restarted and losartan 100 mg daily was continued. BMET done on the same day was WNL.   Pt presents for f/u in good spirits. He states that he is tolerating his current antihypertensive regimen well. Denies dizziness, lightheadedness, or falls. No weakness or fatigue. Reports that he had a gout flare recently but has been getting better with OTC ibuprofen. Reports some baseline swelling in his legs, but has not noticed any additional swelling since starting amlodipine. Pt reports that he started taking two tablets of the amlodipine 10 mg daily starting on the 8/20 after he noticed that his home readings were a little high. They usually range from 130s-150s / 70s-90s, with the occasional high SBP in the 160s.   Current HTN meds: amlodipine 20 mg daily (8am), losartan 100 mg daily (8am) BP goal: <130/80 mmHg  Family History: Cancer in his mother; Diabetes in his mother; Hypertension in his father  Social History: The patient reports that he quit smoking about 22 months ago. His smoking use included Cigarettes. He started smoking about 4 years ago. He has a 5.88 pack-year smoking history. He has never used smokeless tobacco. He reports that he does not drink alcohol or use drugs  Diet: Combination of eating in and out. Not too much fast food. Occasionally add salt. He does drink sweet  tea occasionally.   Exercise:  Goes to gym three times a week. He swims 10 minutes then treadmill for 1 hour and weights for about 20 minutes.   Home BP readings: Starting 01/17/17 readings range from 130s to 150s/70s to 90s, with occasional high values in the 160s/100s.   Wt Readings from Last 3 Encounters:  01/01/17 186 lb (84.4 kg)  12/01/16 186 lb 11.2 oz (84.7 kg)  11/20/16 184 lb (83.5 kg)   BP Readings from Last 3 Encounters:  01/16/17 (!) 164/102  01/01/17 (!) 160/100  12/01/16 (!) 142/68   Pulse Readings from Last 3 Encounters:  01/16/17 68  01/01/17 72  12/01/16 78    Renal function: CrCl cannot be calculated (Patient's most recent lab result is older than the maximum 21 days allowed.).  Past Medical History:  Diagnosis Date  . Anemia   . Arthritis    "feet" (11/19/2015)  . CAD (coronary artery disease), native coronary artery    a. remote LAD stent 1990s. b. s/p DESx2 to LAD 11/2015  . Hepatitis C    "going thru the treatments" (11/19/2015)  . High cholesterol   . History of gout    "just recently" (11/19/2015)  . HIV (human immunodeficiency virus infection) ([place])   . Hypertension   . Type II diabetes mellitus ([place])  diagnosed 04-2015    Current Outpatient Prescriptions on File Prior to Visit  Medication Sig Dispense Refill  . amLODipine (NORVASC) 10 MG tablet Take 1 tablet (10 mg total) by mouth daily. 30 tablet 0  . aspirin EC 81 MG tablet Take 1 tablet (81 mg total) by mouth daily. 90 tablet 3  . bictegravir-emtricitabine-tenofovir AF (BIKTARVY) 50-200-25 MG TABS tablet Take 1 tablet by mouth daily. 90 tablet 3  . blood glucose meter kit and supplies KIT Dispense based on patient and insurance preference. Use up to four times daily as directed. (FOR ICD-9 250.00, 250.01). 1 each 0  . Blood Glucose Monitoring Suppl ([place]) w/Device KIT 1 each by Does not apply route daily. 1 kit 0  . clopidogrel (PLAVIX) 75 MG tablet Take 1 tablet (75 mg  total) by mouth daily. 90 tablet 1  . glipiZIDE (GLUCOTROL) 5 MG tablet TAKE 1 TABLET BY MOUTH EVERY MORNING BEFORE BREAKFAST 30 tablet 2  . glucose blood (ONETOUCH VERIO) test strip Check blood sugar up to 1 time a day 100 each 12  . losartan (COZAAR) 100 MG tablet Take 1 tablet (100 mg total) by mouth daily. 90 tablet 3  . metFORMIN (GLUCOPHAGE) 1000 MG tablet Take 1 tablet (1,000 mg total) by mouth 2 (two) times daily with a meal. 180 tablet 3  . nitroGLYCERIN (NITROSTAT) 0.4 MG SL tablet Place 1 tablet (0.4 mg total) under the tongue every 5 (five) minutes as needed for chest pain. (Patient not taking: Reported on 01/16/2017) 25 tablet 3  . ONETOUCH DELICA LANCETS FINE MISC Check blood sugar up to 1 time a day 100 each 12  . VOLTAREN 1 % GEL APPLY 4 GRAMS TO THE AFFECTED AREA FOUR TIMES DAILY 100 g 0   No current facility-administered medications on file prior to visit.     No Known Allergies   Assessment/Plan:  1. Hypertension: Pt's BP is elevated above his goal of <130/80 mmHg. Discussed potentially starting a thiazide with pt, however pt prefers not to take anything that may worsen his gout. Will start spironolactone 25 mg daily and check BMET in 1 week to assess K and renal fxn. Advised pt to reduce amlodipine back to max effective dose of 70m daily and to continue losartan 100 mg daily. F/u in clinic for BP evaluation in 2 weeks on 03/01/17.   -MBarkley Boards PharmD Student   Paris Hohn E. Usiel Astarita, PharmD, CPP, [id number]. [id number] [place] [address]Phone: [phone] Fax: [phone]/28/2018 10:06 AM

## 2017-02-13 ENCOUNTER — Ambulatory Visit (INDEPENDENT_AMBULATORY_CARE_PROVIDER_SITE_OTHER): Payer: Medicare HMO | Admitting: Pharmacist

## 2017-02-13 ENCOUNTER — Ambulatory Visit: Payer: Medicare HMO

## 2017-02-13 VITALS — BP 146/70 | HR 85

## 2017-02-13 DIAGNOSIS — I10 Essential (primary) hypertension: Secondary | ICD-10-CM

## 2017-02-13 MED ORDER — SPIRONOLACTONE 25 MG PO TABS: 25.0000 mg | ORAL_TABLET | Freq: Every day | ORAL | 3 refills | Status: DC

## 2017-02-13 NOTE — Patient Instructions (Addendum)
It was good seeing you today.  Your blood pressure was elevated today.   Start spironolactone 25 mg 1 tablet by mouth daily in the mornings.  Take only 1 tablet of amlodipine 10 mg daily. Continue losartan 100 mg daily.  Keep checking blood pressure at home and keep a log of the readings.   Follow up in 1 week on 02/20/17 for lab work anytime between 7:30 AM and 4 PM.  Follow up in clinic for blood pressure evaluation in 2 weeks on 03/01/17 at 9:00 AM

## 2017-02-20 ENCOUNTER — Other Ambulatory Visit: Payer: Medicare HMO | Admitting: *Deleted

## 2017-02-20 DIAGNOSIS — I10 Essential (primary) hypertension: Secondary | ICD-10-CM

## 2017-02-20 LAB — BASIC METABOLIC PANEL
BUN/Creatinine Ratio: 19 (ref 9–20)
BUN: 23 mg/dL (ref 6–24)
CALCIUM: 10.6 mg/dL — AB (ref 8.7–10.2)
CHLORIDE: 101 mmol/L (ref 96–106)
CO2: 20 mmol/L (ref 20–29)
Creatinine, Ser: 1.23 mg/dL (ref 0.76–1.27)
GFR calc Af Amer: 74 mL/min/{1.73_m2} (ref >59)
GFR calc non Af Amer: 64 mL/min/{1.73_m2} (ref >59)
Glucose: 75 mg/dL (ref 65–99)
POTASSIUM: 4.5 mmol/L (ref 3.5–5.2)
Sodium: 141 mmol/L (ref 134–144)

## 2017-02-28 NOTE — Progress Notes (Signed)
Patient ID: John Dickerson                 DOB: Nov 06, 1960                      MRN: [id number]     HPI: John John Timberlakeis a 56 y.o.malepatient ofDr. Desmond Dike presents today for hypertension follow up.PMH includes CAD (remote LAD stent 1990s, s/p DESx2 to LAD 11/2015) HIV, DM, HTN, HLD, gout, and anemia. He was recently seen by Dr. Irish Lack and his losartan was increased due to elevated pressure to 160/100. At his most recent HTN clinic visit on 8/28, BP was elevated at 146/70. Pt's amlodipine was decreased to max dose of '10mg'$  daily and he was started on spironolactone '25mg'$  daily. Follow up BMET was stable.  Pt presents to clinic today in good spirits. He reports tolerating his medications well. He denies dizziness, blurred vision, falls, or headache. He brings in a list of his medications today and reports that he just received a deliver of metoprolol succinate '100mg'$ . This was not on patient's medication list or prescribed by [place] - it looks like this was first prescribed by Dr Genene Churn in March 2018. It was reordered by Dr Benjamine Mola in June. Pt does not think he has been taking it for the past few months, but that since it was delivered, he did take a dose today.   He took all of his BP medications this morning about an hour ago. He has not had any caffeine this morning. He has checked his BP at home multiple times - most readings average in the 130s/80s. He can recall a reading of 138/82. This is an improvement from his previous home readings prior to spironolactone initiation - his SBP readings had been running up to 150.  Current HTN meds: amlodipine 10 mg daily (8am), losartan 100 mg daily (8am), spironolactone '25mg'$  daily (8am), Toprol '100mg'$  daily - started this AM  Previously tried: amlodipine '20mg'$  - decreased due to lack of efficacy with doses > '10mg'$  BP goal: <130/80 mmHg  Family History: Cancer in his mother; Diabetes in his mother; Hypertension in his father  Social History: The  patient reports that he quit smoking about 22 months ago. His smoking use included Cigarettes. He started smoking about 4 years ago. He has a 5.88 pack-year smoking history. He has never used smokeless tobacco. He reports that he does not drink alcohol or use drugs  Diet: Combination of eating in and out. Not too much fast food. Occasionally add salt. He does drink sweet tea occasionally.   Exercise: Goes to gym three times a week. He swims 10 minutes then treadmill for 1 hour and weights for about 20 minutes.   Home BP readings: Starting 01/17/17 readings range from 130s to 150s/70s to 90s, with occasional high values in the 160s/100s.   Wt Readings from Last 3 Encounters:  01/01/17 186 lb (84.4 kg)  12/01/16 186 lb 11.2 oz (84.7 kg)  11/20/16 184 lb (83.5 kg)   BP Readings from Last 3 Encounters:  02/13/17 (!) 146/70  01/16/17 (!) 164/102  01/01/17 (!) 160/100   Pulse Readings from Last 3 Encounters:  02/13/17 85  01/16/17 68  01/01/17 72    Renal function: CrCl cannot be calculated (Unknown ideal weight.).  Past Medical History:  Diagnosis Date  . Anemia   . Arthritis    "feet" (11/19/2015)  . CAD (coronary artery disease), native coronary artery    a. remote  LAD stent 1990s. b. s/p DESx2 to LAD 11/2015  . Hepatitis C    "going thru the treatments" (11/19/2015)  . High cholesterol   . History of gout    "just recently" (11/19/2015)  . HIV (human immunodeficiency virus infection) ([place])   . Hypertension   . Type II diabetes mellitus ([place])    diagnosed 04-2015    Current Outpatient Prescriptions on File Prior to Visit  Medication Sig Dispense Refill  . amLODipine (NORVASC) 10 MG tablet TAKE 1 TABLET(10 MG) BY MOUTH DAILY 30 tablet 10  . aspirin EC 81 MG tablet Take 1 tablet (81 mg total) by mouth daily. 90 tablet 3  . bictegravir-emtricitabine-tenofovir AF (BIKTARVY) 50-200-25 MG TABS tablet Take 1 tablet by mouth daily. 90 tablet 3  . blood glucose meter kit and  supplies KIT Dispense based on patient and insurance preference. Use up to four times daily as directed. (FOR ICD-9 250.00, 250.01). 1 each 0  . Blood Glucose Monitoring Suppl ([place]) w/Device KIT 1 each by Does not apply route daily. 1 kit 0  . clopidogrel (PLAVIX) 75 MG tablet Take 1 tablet (75 mg total) by mouth daily. 90 tablet 1  . glipiZIDE (GLUCOTROL) 5 MG tablet TAKE 1 TABLET BY MOUTH EVERY MORNING BEFORE BREAKFAST 30 tablet 2  . glucose blood (ONETOUCH VERIO) test strip Check blood sugar up to 1 time a day 100 each 12  . losartan (COZAAR) 100 MG tablet Take 1 tablet (100 mg total) by mouth daily. 90 tablet 3  . metFORMIN (GLUCOPHAGE) 1000 MG tablet Take 1 tablet (1,000 mg total) by mouth 2 (two) times daily with a meal. 180 tablet 3  . nitroGLYCERIN (NITROSTAT) 0.4 MG SL tablet Place 1 tablet (0.4 mg total) under the tongue every 5 (five) minutes as needed for chest pain. (Patient not taking: Reported on 01/16/2017) 25 tablet 3  . ONETOUCH DELICA LANCETS FINE MISC Check blood sugar up to 1 time a day 100 each 12  . spironolactone (ALDACTONE) 25 MG tablet Take 1 tablet (25 mg total) by mouth daily. 30 tablet 3  . VOLTAREN 1 % GEL APPLY 4 GRAMS TO THE AFFECTED AREA FOUR TIMES DAILY 100 g 0   No current facility-administered medications on file prior to visit.     No Known Allergies   Assessment/Plan:  1. Hypertension - BP has improved since starting spironolactone 2 weeks ago, although BP still remains above goal <130/40mHg at 140/719mg. Pt just resumed taking Toprol 10034maily this AM prescribed by another MD. Given hx of CAD, will continue Toprol 100m25m well as other HTN medications - amlodipine 10mg74mly, losartan 100mg 52my, and spironolactone 25mg d38m. Advised pt to monitor his BP and HR at home due to resuming Toprol (HR today ok at 74). Will f/u in HTN clinic in 4 weeks.   Maitlyn Penza E. Luba Matzen, PharmD, CPP, BCACP [id number] [address]ir[address] [id number] [phone] 8:57 AM

## 2017-03-01 ENCOUNTER — Ambulatory Visit (INDEPENDENT_AMBULATORY_CARE_PROVIDER_SITE_OTHER): Payer: Medicare HMO | Admitting: Pharmacist

## 2017-03-01 VITALS — BP 140/78 | HR 73

## 2017-03-01 DIAGNOSIS — I10 Essential (primary) hypertension: Secondary | ICD-10-CM

## 2017-03-01 NOTE — Patient Instructions (Addendum)
It was nice to see you today!  Take your amlodipine 10mg  and losartan 100mg  in the evening Take your spironolactone 25mg  and metoprolol 100mg  in the morning  Your blood pressure goal is < 130/80 Continue to check your blood pressure at home and record your readings Please bring your readings with you to your next visit Follow up in blood pressure clinic in 4 weeks

## 2017-03-06 NOTE — Telephone Encounter (Signed)
Order signed. Sorry for missing it the first time. Thanks for your help!

## 2017-03-15 ENCOUNTER — Ambulatory Visit (INDEPENDENT_AMBULATORY_CARE_PROVIDER_SITE_OTHER): Payer: Medicare HMO | Admitting: Dietician

## 2017-03-15 DIAGNOSIS — N521 Erectile dysfunction due to diseases classified elsewhere: Principal | ICD-10-CM

## 2017-03-15 DIAGNOSIS — Z713 Dietary counseling and surveillance: Secondary | ICD-10-CM | POA: Diagnosis not present

## 2017-03-15 DIAGNOSIS — E1159 Type 2 diabetes mellitus with other circulatory complications: Secondary | ICD-10-CM

## 2017-03-15 DIAGNOSIS — E119 Type 2 diabetes mellitus without complications: Secondary | ICD-10-CM

## 2017-03-15 NOTE — Patient Instructions (Addendum)
It was great s  eeing you! You are doing great taking care of yourself!  Your appointment with [name] on [address] is Wednesday October 10th at 8:40 AM. [place] care- call Infectious disease to ask them if you can go there to see a dentist. Call [phone]. If not them call us and we can help.     Stop at front desk to schedule with Dr. Benjamine Mola.  Please schedule an appointment with me in 1 year- Septebmer 2019 or sooner if you have questions or concerns.  Feel free to call me anytime.  [name] [phone]

## 2017-03-15 NOTE — Progress Notes (Signed)
Diabetes Self-Management Education  Visit Type:  Follow-up  Appt. Start Time: 900 Appt. End Time: 1000  03/15/2017  [name], identified by name and date of birth, is a 56 y.o. male with a diagnosis of Diabetes:  .   ASSESSMENT Needs refill on spironolactone, ask triage for assistance Dental- thinks it was last year at infectious disease, [phone], he agrees to call them then call us if he cannot be seen there Eye exam- [name]- thinks he has cataracts, scheduled an appointment Blood pressure 135/80, temperature 98.3 F (36.8 C), temperature source Oral, weight 188 lb 12.8 oz (85.6 kg). Body mass index is 32.41 kg/m.       Diabetes Self-Management Education - 03/15/17 0900      Health Coping   How would you rate your overall health? Good     Psychosocial Assessment   Patient Belief/Attitude about Diabetes Motivated to manage diabetes   Self-care barriers None   Self-management support Doctor's office;Family;CDE visits   Patient Concerns Support   Special Needs None   Learning Readiness Change in progress     Pre-Education Assessment   Patient understands the diabetes disease and treatment process. Demonstrates understanding / competency   Patient understands incorporating nutritional management into lifestyle. Needs Review   Patient undertands incorporating physical activity into lifestyle. Demonstrates understanding / competency   Patient understands using medications safely. Demonstrates understanding / competency   Patient understands monitoring blood glucose, interpreting and using results Demonstrates understanding / competency   Patient understands prevention, detection, and treatment of acute complications. Needs Review   Patient understands prevention, detection, and treatment of chronic complications. Needs Review   Patient understands how to develop strategies to address psychosocial issues. Demonstrates understanding / competency   Patient  understands how to develop strategies to promote health/change behavior. Needs Review     Complications   Last HgB A1C per patient/outside source 5.7 %   How often do you check your blood sugar? 3-4 times / week   Fasting Blood glucose range (mg/dL) 70-129   Have you had a dilated eye exam in the past 12 months? No   Have you had a dental exam in the past 12 months? No   Are you checking your feet? Yes   How many days per week are you checking your feet? 7     Dietary Intake   Breakfast vegetable juice, milk   Snack (morning) peanuts   Lunch cheese or chicken breast, tuan, peanut butter and hjelly, eggs honey wheat sandwich, water, chips   Snack (afternoon) cupcake, ic crea   Dinner [employer] giro   Snack (evening) cupcake, chips   Beverage(s) water, vegetable juice, orange or grape juice, beer     Exercise   Exercise Type Moderate (swimming / aerobic walking)     Subsequent Visit   Since your last visit have you continued or begun to take your medications as prescribed? Yes   Since your last visit have you had your blood pressure checked? Yes   Is your most recent blood pressure lower, unchanged, or higher since your last visit? Lower   Since your last visit have you experienced any weight changes? No change   Since your last visit, are you checking your blood glucose at least once a day? No  one time a week      Learning Objective:  Patient will have a greater understanding of diabetes self-management. Patient education plan is to attend individual and/or group sessions per assessed needs  and concerns.   Plan:   Patient Instructions  It was great s  eeing you! You are doing great taking care of yourself!  Your appointment with [name] on [address] is Wednesday October 10th at 8:40 AM. [place] care- call Infectious disease to ask them if you can go there to see a dentist. Call [phone]. If not them call us and we can help.     Stop at front desk  to schedule with Dr. Benjamine Mola.  Please schedule an appointment with me in 1 year- Septebmer 2019 or sooner if you have questions or concerns.  Feel free to call me anytime.  [name] [phone]    Expected Outcomes:     Education material provided: A1C conversion sheet  If problems or questions, patient to contact team via:  Phone  Future DSME appointment: -   yearly Maleigha Colvard, [name], [place] 03/15/2017 10:12 AM.

## 2017-03-16 ENCOUNTER — Other Ambulatory Visit: Payer: Self-pay | Admitting: Internal Medicine

## 2017-03-16 DIAGNOSIS — E1159 Type 2 diabetes mellitus with other circulatory complications: Secondary | ICD-10-CM

## 2017-03-16 DIAGNOSIS — N521 Erectile dysfunction due to diseases classified elsewhere: Principal | ICD-10-CM

## 2017-03-28 NOTE — Progress Notes (Signed)
Patient ID: KERN GINGRAS                 DOB: 05-Sep-1960                      MRN: [id number]     HPI: John Shearer Timberlakeis a 56 y.o.malepatient ofDr. Desmond Dike presents today for hypertension follow up.PMH includes CAD (remote LAD stent 1990s, s/p DESx2 to LAD 11/2015) HIV, DM, HTN, HLD, gout, and anemia. He has been followed closely by HTN clinic over the past 3 months. At his last visit, pt had just resumed his Toprol prescribed by another MD. He presents today for follow up  Pt presents to clinic today in good spirits. He reports tolerating his medications well. He denies dizziness, blurred vision, falls, or headache. He denies fatigue with resumption of metoprolol at last visit. He has been spacing apart his BP medications and taking his metoprolol and spironolactone in the morning and his amlodipine and losartan in the evening.   Home BP readings have been excellent and all at goal < 130/17mHg. This is a huge improvement from his previous home readings. He has not taken his morning medications yet. He does not drink caffeine and has been cutting back on his sodium intake. He continues to work out at [place] days per week.  Current HTN meds:amlodipine 150mdaily (PM), losartan 100 mg daily (PM), spironolactone 2557maily (AM), Toprol 100m30mily (AM) Previously tried: amlodipine 20mg11mecreased due to lack of efficacy with doses > 10mg 67moal: <130/80 mmHg  Family History: Cancer in his mother; Diabetes in his mother; Hypertension in his father  Social History: The patient reports that he quit smoking about 22 months ago. His smoking use included Cigarettes. He started smoking about 4 years ago. He has a 5.88 pack-year smoking history. He has never used smokeless tobacco. He reports that he does not drink alcohol or use drugs  Diet:Combination of eating in and out. Not too much fast food. Occasionally add salt. He does drink sweet tea occasionally.    Exercise:Goes to gym three times a week. He swims 10 minutes then treadmill for 1 hour and weights for about 20 minutes.   Home BP readings:127/64, 119/69, 132/76, 126/77, 106/64, 126/65. Readings are all improved from last visit.   Wt Readings from Last 3 Encounters:  03/15/17 188 lb 12.8 oz (85.6 kg)  01/01/17 186 lb (84.4 kg)  12/01/16 186 lb 11.2 oz (84.7 kg)   BP Readings from Last 3 Encounters:  03/15/17 135/80  03/01/17 140/78  02/13/17 (!) 146/70   Pulse Readings from Last 3 Encounters:  03/01/17 73  02/13/17 85  01/16/17 68    Renal function: CrCl cannot be calculated (Patient's most recent lab result is older than the maximum 21 days allowed.).  Past Medical History:  Diagnosis Date  . Anemia   . Arthritis    "feet" (11/19/2015)  . CAD (coronary artery disease), native coronary artery    a. remote LAD stent 1990s. b. s/p DESx2 to LAD 11/2015  . Hepatitis C    "going thru the treatments" (11/19/2015)  . High cholesterol   . History of gout    "just recently" (11/19/2015)  . HIV (human immunodeficiency virus infection) ([facility])  [place]   . Type II diabetes mellitus ([facility])  [place]agnosed 04-2015    Current Outpatient Prescriptions on File Prior to Visit  Medication Sig Dispense Refill  . allopurinol (  ZYLOPRIM) 100 MG tablet Take 150 mg by mouth daily.    [name] amLODipine (NORVASC) 10 MG tablet TAKE 1 TABLET(10 MG) BY MOUTH DAILY 30 tablet 10  . aspirin EC 81 MG tablet Take 1 tablet (81 mg total) by mouth daily. 90 tablet 3  . bictegravir-emtricitabine-tenofovir AF (BIKTARVY) 50-200-25 MG TABS tablet Take 1 tablet by mouth daily. 90 tablet 3  . blood glucose meter kit and supplies KIT Dispense based on patient and insurance preference. Use up to four times daily as directed. (FOR ICD-9 250.00, 250.01). 1 each 0  . Blood Glucose Monitoring Suppl ([place]) w/Device KIT 1 each by Does not apply route daily. 1 kit 0  . clopidogrel (PLAVIX) 75  MG tablet Take 1 tablet (75 mg total) by mouth daily. 90 tablet 1  . glipiZIDE (GLUCOTROL) 5 MG tablet TAKE 1 TABLET BY MOUTH EVERY MORNING BEFORE BREAKFAST 90 tablet 0  . glucose blood (ONETOUCH VERIO) test strip Check blood sugar up to 1 time a day 100 each 12  . losartan (COZAAR) 100 MG tablet Take 1 tablet (100 mg total) by mouth daily. 90 tablet 3  . metFORMIN (GLUCOPHAGE) 1000 MG tablet TAKE 1 TABLET(1000 MG) BY MOUTH TWICE DAILY WITH A MEAL 180 tablet 0  . metoprolol succinate (TOPROL-XL) 100 MG 24 hr tablet Take 100 mg by mouth daily. Take with or immediately following a meal.    . nitroGLYCERIN (NITROSTAT) 0.4 MG SL tablet Place 1 tablet (0.4 mg total) under the tongue every 5 (five) minutes as needed for chest pain. (Patient not taking: Reported on 01/16/2017) 25 tablet 3  . ONETOUCH DELICA LANCETS FINE MISC Check blood sugar up to 1 time a day 100 each 12  . spironolactone (ALDACTONE) 25 MG tablet Take 1 tablet (25 mg total) by mouth daily. 30 tablet 3  . VOLTAREN 1 % GEL APPLY 4 GRAMS TO THE AFFECTED AREA FOUR TIMES DAILY (Patient not taking: Reported on 03/01/2017) 100 g 0   No current facility-administered medications on file prior to visit.     No Known Allergies   Assessment/Plan:  1. Hypertension - BP is much improved and now at goal < 130/67mHg. Will continue spironolactone 280mdaily (AM), Toprol 25102maily (AM), amlodipine 81m34mily (PM), and losartan 100mg68mly (PM). Encouraged pt to continue with sodium restriction and staying active. Pt advised to continue to monitor his BP at home and call with concerns. F/u in HTN clinic as needed. Pt will keep f/u appt with Dr VaranIrish Lack month.   Megan E. Supple, PharmD, CPP, BCACPLoch Lloyd [id number] [id number]: [phone]: [phone]/2018 8:53 AM

## 2017-03-29 ENCOUNTER — Ambulatory Visit (INDEPENDENT_AMBULATORY_CARE_PROVIDER_SITE_OTHER): Payer: Medicare HMO | Admitting: Pharmacist

## 2017-03-29 VITALS — BP 130/74 | HR 76

## 2017-03-29 DIAGNOSIS — I10 Essential (primary) hypertension: Secondary | ICD-10-CM

## 2017-03-29 NOTE — Patient Instructions (Signed)
It was nice to see you today. Your blood pressure was excellent!  Continue taking your medications - spironolactone and metoprolol in the morning and amlodipine and losartan in the evening  Follow up with Dr Irish Lack in 1 month as scheduled  Call clinic with any blood pressure related concerns

## 2017-04-10 NOTE — Addendum Note (Signed)
Addended by: Lorne Skeens D on: 04/10/2017 05:18 PM   Modules accepted: Orders

## 2017-04-18 ENCOUNTER — Other Ambulatory Visit: Payer: Self-pay | Admitting: Internal Medicine

## 2017-04-18 DIAGNOSIS — E1159 Type 2 diabetes mellitus with other circulatory complications: Secondary | ICD-10-CM

## 2017-04-18 DIAGNOSIS — N521 Erectile dysfunction due to diseases classified elsewhere: Principal | ICD-10-CM

## 2017-04-19 ENCOUNTER — Other Ambulatory Visit: Payer: Self-pay | Admitting: *Deleted

## 2017-04-19 MED ORDER — ALLOPURINOL 100 MG PO TABS: 150.0000 mg | ORAL_TABLET | Freq: Every day | ORAL | 1 refills | Status: DC

## 2017-04-27 ENCOUNTER — Encounter: Payer: Self-pay | Admitting: Interventional Cardiology

## 2017-05-01 ENCOUNTER — Other Ambulatory Visit: Payer: Self-pay | Admitting: Internal Medicine

## 2017-05-01 DIAGNOSIS — I10 Essential (primary) hypertension: Secondary | ICD-10-CM

## 2017-05-05 NOTE — Progress Notes (Deleted)
Cardiology Office Note   Date:  05/05/2017   ID:  John Dickerson, DOB 1960-11-03, MRN [id number]  PCP:  Collier Salina, MD    No chief complaint on file.    Wt Readings from Last 3 Encounters:  03/15/17 188 lb 12.8 oz (85.6 kg)  01/01/17 186 lb (84.4 kg)  12/01/16 186 lb 11.2 oz (84.7 kg)       History of Present Illness: John Dickerson is a 56 y.o. male  with history of CAD (remote LAD stent 1990s, s/p DESx2 to LAD 11/2015) HIV, DM, HTN, HLD, gout, anemia.    Outpatient cath was done in June 2017 demonstrating 80% mid LAD (in area previously treated with a stent) and 80% distal LAD, which were both treated with DES. He had residual nonobstructive disease otherwise - 50% ramus, 80% OM1, 70% ost LPDA, 50% mid-distal LAD. LVEF was normal. He was started on Plavix. Dr. Haroldine Laws discussed statin therapy with ID who cleared the patient for atorvastatin 31m daily. LFTs were normal 12/29/15. LDL in 10/2015 was 99. Of note, Hgb in 2016 was 15-16. In 2017 it has run in the 9-11 range.    Past Medical History:  Diagnosis Date  . Anemia   . Arthritis    "feet" (11/19/2015)  . CAD (coronary artery disease), native coronary artery    a. remote LAD stent 1990s. b. s/p DESx2 to LAD 11/2015  . Hepatitis C    "going thru the treatments" (11/19/2015)  . High cholesterol   . History of gout    "just recently" (11/19/2015)  . HIV [place] virus infection) ([place]   . Hypertension   . Type II diabetes mellitus ([place]    diagnosed 04-2015    Past Surgical History:  Procedure Laterality Date  . CORONARY ANGIOPLASTY WITH STENT PLACEMENT  11/19/2015   "2 stents"  . Coronary Stent Intervention N/A 11/19/2015   [facility]ze [facility]  . Left Heart Cath and Coronary Angiography N/A 11/19/2015   [facility] BJolaine Artist [facility]     Current Outpatient Medications  Medication Sig Dispense Refill  .  allopurinol (ZYLOPRIM) 100 MG tablet Take 1.5 tablets (150 mg total) by mouth daily. 135 tablet 1  . amLODipine (NORVASC) 10 MG tablet TAKE 1 TABLET(10 MG) BY MOUTH DAILY 30 tablet 10  . amLODipine (NORVASC) 10 MG tablet TAKE 1 TABLET BY MOUTH DAILY 90 tablet 0  . aspirin EC 81 MG tablet Take 1 tablet (81 mg total) by mouth daily. 90 tablet 3  . bictegravir-emtricitabine-tenofovir AF (BIKTARVY) 50-200-25 MG TABS tablet Take 1 tablet by mouth daily. 90 tablet 3  . blood glucose meter kit and supplies KIT Dispense based on patient and insurance preference. Use up to four times daily as directed. (FOR ICD-9 250.00, 250.01). 1 each 0  . Blood Glucose Monitoring Suppl ([place] w/Device KIT 1 each by Does not apply route daily. 1 kit 0  . clopidogrel (PLAVIX) 75 MG tablet Take 1 tablet (75 mg total) by mouth daily. 90 tablet 1  . glipiZIDE (GLUCOTROL) 5 MG tablet TAKE 1 TABLET BY MOUTH EVERY MORNING BEFORE BREAKFAST 90 tablet 1  . glucose blood (ONETOUCH VERIO) test strip Check blood sugar up to 1 time a day 100 each 12  . losartan (COZAAR) 100 MG tablet Take 1 tablet (100 mg total) by mouth daily. 90 tablet 3  . metFORMIN (  GLUCOPHAGE) 1000 MG tablet TAKE 1 TABLET BY MOUTH TWICE DAILY WITH A MEAL 180 tablet 1  . metoprolol succinate (TOPROL-XL) 100 MG 24 hr tablet Take 100 mg by mouth daily. Take with or immediately following a meal.    . nitroGLYCERIN (NITROSTAT) 0.4 MG SL tablet Place 1 tablet (0.4 mg total) under the tongue every 5 (five) minutes as needed for chest pain. (Patient not taking: Reported on 01/16/2017) 25 tablet 3  . ONETOUCH DELICA LANCETS FINE MISC Check blood sugar up to 1 time a day 100 each 12  . spironolactone (ALDACTONE) 25 MG tablet Take 1 tablet (25 mg total) by mouth daily. 30 tablet 3  . VOLTAREN 1 % GEL APPLY 4 GRAMS TO THE AFFECTED AREA FOUR TIMES DAILY (Patient not taking: Reported on 03/01/2017) 100 g 0   No current facility-administered medications for  this visit.     Allergies:   Patient has no known allergies.    Social History:  The patient  reports that he quit smoking about 2 years ago. His smoking use included cigarettes. He started smoking about 4 years ago. He has a 5.88 pack-year smoking history. he has never used smokeless tobacco. He reports that he does not drink alcohol or use drugs.   Family History:  The patient's ***family history includes Cancer in his mother; Diabetes in his mother; Hypertension in his father.    ROS:  Please see the history of present illness.   Otherwise, review of systems are positive for ***.   All other systems are reviewed and negative.    PHYSICAL EXAM: VS:  There were no vitals taken for this visit. , BMI There is no height or weight on file to calculate BMI. GEN: Well nourished, well developed, in no acute distress  HEENT: normal  Neck: no JVD, carotid bruits, or masses Cardiac: ***RRR; no murmurs, rubs, or gallops,no edema  Respiratory:  clear to auscultation bilaterally, normal work of breathing GI: soft, nontender, nondistended, + BS MS: no deformity or atrophy  Skin: warm and dry, no rash Neuro:  Strength and sensation are intact Psych: euthymic mood, full affect   EKG:   The ekg ordered today demonstrates ***   Recent Labs: 09/06/2016: ALT 23; Hemoglobin 14.8; Platelets 327 02/20/2017: BUN 23; Creatinine, Ser 1.23; Potassium 4.5; Sodium 141   Lipid Panel    Component Value Date/Time   CHOL 130 03/22/2016 1155   CHOL 158 11/03/2015 1013   TRIG 47 03/22/2016 1155   HDL 41 03/22/2016 1155   HDL 39 (L) 11/03/2015 1013   CHOLHDL 3.2 03/22/2016 1155   VLDL 9 03/22/2016 1155   LDLCALC 80 03/22/2016 1155   LDLCALC 99 11/03/2015 1013     Other studies Reviewed: Additional studies/ records that were reviewed today with results demonstrating: ***.   ASSESSMENT AND PLAN:  1. CAD: DES to the LAD in 2017.  Medical management for residual CAD.   2. Hyperlipidemia: 3. HTN:  Cozaar increased last year.  Followed in HTN clinic.  4. HIV: Followed by Dr. Johnnye Sima.    Current medicines are reviewed at length with the patient today.  The patient concerns regarding his medicines were addressed.  The following changes have been made:  No change***  Labs/ tests ordered today include: *** No orders of the defined types were placed in this encounter.   Recommend 150 minutes/week of aerobic exercise Low fat, low carb, high fiber diet recommended  Disposition:   FU in ***   Signed,  Larae Grooms, MD  05/05/2017 5:02 PM    [facility] [place], [place],   [address] Phone: [phone]; Fax: [phone]

## 2017-05-08 ENCOUNTER — Ambulatory Visit: Payer: Medicare HMO | Admitting: Interventional Cardiology

## 2017-05-09 ENCOUNTER — Other Ambulatory Visit: Payer: Medicare HMO

## 2017-05-15 ENCOUNTER — Other Ambulatory Visit: Payer: Medicare HMO

## 2017-05-15 DIAGNOSIS — B20 Human immunodeficiency virus [HIV] disease: Secondary | ICD-10-CM

## 2017-05-15 DIAGNOSIS — E781 Pure hyperglyceridemia: Secondary | ICD-10-CM

## 2017-05-15 LAB — COMPREHENSIVE METABOLIC PANEL
AG Ratio: 1.4 (calc) (ref 1.0–2.5)
ALBUMIN MSPROF: 4.2 g/dL (ref 3.6–5.1)
ALKALINE PHOSPHATASE (APISO): 72 U/L (ref 40–115)
ALT: 27 U/L (ref 9–46)
AST: 29 U/L (ref 10–35)
BUN/Creatinine Ratio: 22 (calc) (ref 6–22)
BUN: 27 mg/dL — ABNORMAL HIGH (ref 7–25)
CHLORIDE: 103 mmol/L (ref 98–110)
CO2: 25 mmol/L (ref 20–32)
CREATININE: 1.24 mg/dL (ref 0.70–1.33)
Calcium: 9.5 mg/dL (ref 8.6–10.3)
GLOBULIN: 3 g/dL (ref 1.9–3.7)
Glucose, Bld: 126 mg/dL — ABNORMAL HIGH (ref 65–99)
Potassium: 4.8 mmol/L (ref 3.5–5.3)
Sodium: 135 mmol/L (ref 135–146)
Total Bilirubin: 0.5 mg/dL (ref 0.2–1.2)
Total Protein: 7.2 g/dL (ref 6.1–8.1)

## 2017-05-15 LAB — CBC
HCT: 37.6 % — ABNORMAL LOW (ref 38.5–50.0)
Hemoglobin: 13.3 g/dL (ref 13.2–17.1)
MCH: 31.8 pg (ref 27.0–33.0)
MCHC: 35.4 g/dL (ref 32.0–36.0)
MCV: 90 fL (ref 80.0–100.0)
MPV: 9.3 fL (ref 7.5–12.5)
PLATELETS: 332 10*3/uL (ref 140–400)
RBC: 4.18 10*6/uL — ABNORMAL LOW (ref 4.20–5.80)
RDW: 13.4 % (ref 11.0–15.0)
WBC: 6.3 10*3/uL (ref 3.8–10.8)

## 2017-05-15 LAB — LIPID PANEL
CHOLESTEROL: 205 mg/dL — AB (ref <200)
HDL: 41 mg/dL (ref >40)
LDL Cholesterol (Calc): 141 mg/dL (calc) — ABNORMAL HIGH
NON-HDL CHOLESTEROL (CALC): 164 mg/dL — AB (ref <130)
TRIGLYCERIDES: 116 mg/dL (ref <150)
Total CHOL/HDL Ratio: 5 (calc) — ABNORMAL HIGH (ref <5.0)

## 2017-05-16 LAB — T-HELPER CELL (CD4) - (RCID CLINIC ONLY)
CD4 T CELL ABS: 540 /uL (ref 400–2700)
CD4 T CELL HELPER: 24 % — AB (ref 33–55)

## 2017-05-17 ENCOUNTER — Other Ambulatory Visit: Payer: Self-pay | Admitting: Internal Medicine

## 2017-05-17 LAB — HIV-1 RNA QUANT-NO REFLEX-BLD
HIV 1 RNA QUANT: DETECTED {copies}/mL — AB
HIV-1 RNA QUANT, LOG: DETECTED {Log_copies}/mL — AB

## 2017-05-18 ENCOUNTER — Other Ambulatory Visit: Payer: Self-pay

## 2017-05-18 ENCOUNTER — Encounter: Payer: Self-pay | Admitting: Internal Medicine

## 2017-05-18 ENCOUNTER — Ambulatory Visit (INDEPENDENT_AMBULATORY_CARE_PROVIDER_SITE_OTHER): Payer: Medicare HMO | Admitting: Internal Medicine

## 2017-05-18 VITALS — BP 138/66 | HR 74 | Temp 98.0°F | Ht 63.5 in | Wt 188.9 lb

## 2017-05-18 DIAGNOSIS — E1136 Type 2 diabetes mellitus with diabetic cataract: Secondary | ICD-10-CM

## 2017-05-18 DIAGNOSIS — N521 Erectile dysfunction due to diseases classified elsewhere: Secondary | ICD-10-CM

## 2017-05-18 DIAGNOSIS — Z79899 Other long term (current) drug therapy: Secondary | ICD-10-CM | POA: Diagnosis not present

## 2017-05-18 DIAGNOSIS — E782 Mixed hyperlipidemia: Secondary | ICD-10-CM

## 2017-05-18 DIAGNOSIS — I10 Essential (primary) hypertension: Secondary | ICD-10-CM | POA: Diagnosis not present

## 2017-05-18 DIAGNOSIS — E1159 Type 2 diabetes mellitus with other circulatory complications: Secondary | ICD-10-CM

## 2017-05-18 DIAGNOSIS — Z1211 Encounter for screening for malignant neoplasm of colon: Secondary | ICD-10-CM

## 2017-05-18 DIAGNOSIS — H269 Unspecified cataract: Secondary | ICD-10-CM

## 2017-05-18 DIAGNOSIS — Z87891 Personal history of nicotine dependence: Secondary | ICD-10-CM

## 2017-05-18 DIAGNOSIS — E785 Hyperlipidemia, unspecified: Secondary | ICD-10-CM | POA: Diagnosis not present

## 2017-05-18 DIAGNOSIS — Z7984 Long term (current) use of oral hypoglycemic drugs: Secondary | ICD-10-CM | POA: Diagnosis not present

## 2017-05-18 DIAGNOSIS — Z0000 Encounter for general adult medical examination without abnormal findings: Secondary | ICD-10-CM

## 2017-05-18 DIAGNOSIS — K5900 Constipation, unspecified: Secondary | ICD-10-CM

## 2017-05-18 LAB — GLUCOSE, CAPILLARY
GLUCOSE-CAPILLARY: 56 mg/dL — AB (ref 65–99)
GLUCOSE-CAPILLARY: 94 mg/dL (ref 65–99)

## 2017-05-18 MED ORDER — METFORMIN HCL 1000 MG PO TABS: 1000.0000 mg | ORAL_TABLET | Freq: Every day | ORAL | Status: DC

## 2017-05-18 NOTE — Patient Instructions (Signed)
FOLLOW-UP INSTRUCTIONS When: In about 3 months For: diabetes follow up What to bring: Medication scripts  You are doing a great job controlling your diabetes with exercise and diet. You can decrease your medications. Stop taking your glipizide 5mg  and decrease your metformin to once daily. I will contact you next week or talk to your ID doctor about choices for cholesterol management. We will send you home with stool blood testing as a routine screen for colon cancer.  Call back sooner if you need [place] in the meantime or feel poorly with these changes.

## 2017-05-18 NOTE — Progress Notes (Signed)
Hypoglycemic Event CBG: 56 Treatment: 3 Glucose Tablets  Symptoms: none  Follow-up CBG: Time 1:54 PM CBG Result:94 Possible Reasons for Event:  Had not eaten lunch.  Breakfast this am 9. Comments/MD notified:  Dr. Rockey Situ    CC: Follow up for diabetes management  HPI:  JohnJohn Dickerson Pickup is a 56 y.o. male with PMHx detailed below presenting for follow up of his diabetes but feels in good health.  See problem based assessment and plan below for additional details.  Type 2 diabetes mellitus with circulatory disorder causing erectile dysfunction (HCC) He continues to feel well without symptoms of hypoglycemia. He does not bring his meter or check CBGs at home often but is only on metformin and glipizide. His sugar was well if not excessively controlled earlier this year in June. At clinic today his sugar was 56 with no symptomatic complaints. This improved to 94 before leaving clinic. He is more agreeable to reducing his pill burden today as well. Assessment Very well controlled diabetes with Hgb A1c goal <7.0% He has made lifestyle improvements and may be diet controlled at this point He is on an appropriately titrated ARB at this time so no need for microalbumin study Plan Discontinue glipizide Reduce metformin to 1040m once daily Does not need frequent home CBG monitoring Check Hgb A1c at 3 months  Essential hypertension His blood pressure was not well controlled and medications were started at cardiology clinic since his last visit here. He is now on losartan and spironolactone. He has minimal lower extremity edema on the current medications. Blood pressure is 138/66. Assessment Blood pressure controlled below goal of 140/90 He is tolerating these without significant metabolic abnormality on Bmet 11/27 Plan Continue amlodipine 181m spironolactone 2543mlosartan 100m73mily  Hyperlipemia Recent lipid profile suggests LDL is above goal - total cholesterol was  205 with LDL 141. He has previously been on gemfibrozil for hypertriglyceridemia. This was not shown on the recent test. Currently he would qualify for starting statin therapy. He was agreeable to this idea if appropriate. I will contact his ID physician to confirm if starting statin therapy is appropriate or could this dyslipidemia be related to change in ARTs.  Preventative health care He has already received a flu shot earlier this year in September. He is agreeable to [facility] fecal occult blood w/ immunochemical testing. Provided testing kit today.  Cataracts, bilateral He has recently seen his ophthalmologist with no complications of diabetic retinopathy. He does have cataracts that are only mildly impairing at this time, and was recommended to follow up about removal again after he is 58.    Past Medical History:  Diagnosis Date  . Anemia   . Arthritis    "feet" (11/19/2015)  . CAD (coronary artery disease), native coronary artery    a. remote LAD stent 1990s. b. s/p DESx2 to LAD 11/2015  . Hepatitis C    "going thru the treatments" (11/19/2015)  . High cholesterol   . History of gout    "just recently" (11/19/2015)  . HIV (human immunodeficiency virus infection) (HCC)[place]. Hypertension   . Type II diabetes mellitus (HCC)[place] diagnosed 04-2015    Review of Systems: Review of Systems  Eyes: Positive for blurred vision.  Respiratory: Negative for shortness of breath.   Cardiovascular: Positive for leg swelling. Negative for chest pain.  Gastrointestinal: Positive for constipation. Negative for abdominal pain, blood in stool and melena.  Genitourinary: Negative  for frequency.  Neurological: Negative for sensory change and weakness.     Physical Exam: Vitals:   05/18/17 1316  BP: 138/66  Pulse: 74  Temp: 98 F (36.7 C)  TempSrc: Oral  SpO2: 100%  Weight: 188 lb 14.4 oz (85.7 kg)  Height: 5' 3.5" (1.613 m)   GENERAL- alert, co-operative, NAD HEENT- Faint  cataracts bilaterally, oral mucosa appears moist, good and intact dentition CARDIAC- RRR, no murmurs, rubs or gallops. RESP- CTAB, no wheezes or crackles. ABDOMEN- Soft, nontender, no guarding or rebound EXTREMITIES- Symmetric trace pedal edema bilaterally SKIN- Warm, dry, No rash or lesion. PSYCH- Normal mood and affect, appropriate thought content and speech.    Assessment & Plan:   See encounters tab for problem based medical decision making.   Patient discussed with Dr. Daryll Drown

## 2017-05-21 DIAGNOSIS — H269 Unspecified cataract: Secondary | ICD-10-CM | POA: Insufficient documentation

## 2017-05-21 NOTE — Assessment & Plan Note (Signed)
His blood pressure was not well controlled and medications were started at cardiology clinic since his last visit here. He is now on losartan and spironolactone. He has minimal lower extremity edema on the current medications. Blood pressure is 138/66. Assessment Blood pressure controlled below goal of 140/90 He is tolerating these without significant metabolic abnormality on Bmet 11/27 Plan Continue amlodipine 10mg , spironolactone 25mg , losartan 100mg  daily

## 2017-05-21 NOTE — Assessment & Plan Note (Signed)
He has already received a flu shot earlier this year in September. He is agreeable to [facility] screening with fecal occult blood w/ immunochemical testing. Provided testing kit today.

## 2017-05-21 NOTE — Assessment & Plan Note (Signed)
He has recently seen his ophthalmologist with no complications of diabetic retinopathy. He does have cataracts that are only mildly impairing at this time, and was recommended to follow up about removal again after he is 58.

## 2017-05-21 NOTE — Progress Notes (Signed)
Internal Medicine Clinic Attending  Case discussed with Dr. Rice at the time of the visit.  We reviewed the resident's history and exam and pertinent patient test results.  I agree with the assessment, diagnosis, and plan of care documented in the resident's note.  

## 2017-05-21 NOTE — Assessment & Plan Note (Signed)
Recent lipid profile suggests LDL is above goal - total cholesterol was 205 with LDL 141. He has previously been on gemfibrozil for hypertriglyceridemia. This was not shown on the recent test. Currently he would qualify for starting statin therapy. He was agreeable to this idea if appropriate. I will contact his ID physician to confirm if starting statin therapy is appropriate or could this dyslipidemia be related to change in ARTs.

## 2017-05-21 NOTE — Assessment & Plan Note (Addendum)
He continues to feel well without symptoms of hypoglycemia. He does not bring his meter or check CBGs at home often but is only on metformin and glipizide. His sugar was well if not excessively controlled earlier this year in June. At clinic today his sugar was 56 with no symptomatic complaints. This improved to 94 before leaving clinic. He is more agreeable to reducing his pill burden today as well. Assessment Very well controlled diabetes with Hgb A1c goal <7.0% He has made lifestyle improvements and may be diet controlled at this point He is on an appropriately titrated ARB at this time so no need for microalbumin study Plan Discontinue glipizide Reduce metformin to 1000mg  once daily Does not need frequent home CBG monitoring Check Hgb A1c at 3 months

## 2017-05-23 ENCOUNTER — Encounter: Payer: Self-pay | Admitting: Infectious Diseases

## 2017-05-23 ENCOUNTER — Other Ambulatory Visit: Payer: Self-pay | Admitting: Pharmacist

## 2017-05-23 ENCOUNTER — Ambulatory Visit (INDEPENDENT_AMBULATORY_CARE_PROVIDER_SITE_OTHER): Payer: Medicare HMO | Admitting: Infectious Diseases

## 2017-05-23 ENCOUNTER — Ambulatory Visit: Payer: Medicare HMO | Admitting: Infectious Diseases

## 2017-05-23 VITALS — BP 160/92 | HR 73 | Temp 98.5°F | Ht 64.5 in | Wt 189.0 lb

## 2017-05-23 DIAGNOSIS — E1159 Type 2 diabetes mellitus with other circulatory complications: Secondary | ICD-10-CM | POA: Diagnosis not present

## 2017-05-23 DIAGNOSIS — Z79899 Other long term (current) drug therapy: Secondary | ICD-10-CM

## 2017-05-23 DIAGNOSIS — B20 Human immunodeficiency virus [HIV] disease: Secondary | ICD-10-CM

## 2017-05-23 DIAGNOSIS — I10 Essential (primary) hypertension: Secondary | ICD-10-CM

## 2017-05-23 DIAGNOSIS — N521 Erectile dysfunction due to diseases classified elsewhere: Secondary | ICD-10-CM

## 2017-05-23 DIAGNOSIS — E781 Pure hyperglyceridemia: Secondary | ICD-10-CM | POA: Diagnosis not present

## 2017-05-23 DIAGNOSIS — B1710 Acute hepatitis C without hepatic coma: Secondary | ICD-10-CM | POA: Diagnosis not present

## 2017-05-23 DIAGNOSIS — Z113 Encounter for screening for infections with a predominantly sexual mode of transmission: Secondary | ICD-10-CM | POA: Diagnosis not present

## 2017-05-23 MED ORDER — ATORVASTATIN CALCIUM 20 MG PO TABS: 20.0000 mg | ORAL_TABLET | Freq: Every day | ORAL | 3 refills | Status: DC

## 2017-05-23 NOTE — Assessment & Plan Note (Signed)
Will start him on lipitor 20mg  after discussing with his excellent pcp.

## 2017-05-23 NOTE — Assessment & Plan Note (Signed)
Appreciate IM f/u.  

## 2017-05-23 NOTE — Assessment & Plan Note (Signed)
Will check f/u liver u/s to assess his hepatocellular CA risk.

## 2017-05-23 NOTE — Progress Notes (Signed)
   Subjective:    Patient ID: John Dickerson, male    DOB: 1960/12/31, 56 y.o.   MRN: [id number]  HPI 56yo M with hx of HIV+ was Dx 1998, DM2, and HTN. Previous Hep C 1a. 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  He was on atripla til changed to [place] for Hep C rx.  He was started on zepatier/sovaldi/rib (plan 16 weeks) on 2-25 after he took harvoni for short course. His ART was changed to Isentress/Descovey at that time. He was changed to biktarvy 11-2016. Repeat Hep C RNA negative ( 7-17 and 3-18).   HIV 1 RNA Quant (copies/mL)  Date Value  05/15/2017 <20 DETECTED (A)  09/06/2016 <20 NOT DETECTED  03/22/2016 <20   CD4 T Cell Abs (/uL)  Date Value  05/15/2017 540  09/06/2016 820  03/22/2016 550   He has been doing well.  FSG have been "pretty good". Last A1C was 5.6%. States he has been taken off his night rx due to low AM readings.   Review of Systems  Constitutional: Positive for unexpected weight change. Negative for appetite change.  Respiratory: Negative for shortness of breath.   Gastrointestinal: Negative for constipation and diarrhea.  Genitourinary: Negative for difficulty urinating.  Neurological: Negative for light-headedness.  Psychiatric/Behavioral: Negative for sleep disturbance.  Please see HPI. All other systems reviewed and negative.      Objective:   Physical Exam  Constitutional: He appears well-developed and well-nourished.  HENT:  Mouth/Throat: No oropharyngeal exudate.  Eyes: EOM are normal. Pupils are equal, round, and reactive to light.  Neck: Neck supple.  Cardiovascular: Normal rate, regular rhythm and normal heart sounds.  Pulmonary/Chest: Effort normal and breath sounds normal.  Abdominal: Soft. Bowel sounds are normal. There is no tenderness. There is no rebound.  Musculoskeletal: He exhibits no edema.  Lymphadenopathy:    He has no cervical adenopathy.  Psychiatric: He has a normal mood and affect.      Assessment & Plan:

## 2017-05-23 NOTE — Assessment & Plan Note (Signed)
He has condoms.  He is doing well Has gotten flu shot. Tdap up to date.  Will see him back in 6 months with me, 2-3 months with pharmacy.

## 2017-05-23 NOTE — Assessment & Plan Note (Signed)
appreciate IM following.

## 2017-05-25 ENCOUNTER — Ambulatory Visit
Admission: RE | Admit: 2017-05-25 | Discharge: 2017-05-25 | Disposition: A | Discharge status: Home / Self Care | Payer: Medicare HMO | Source: Ambulatory Visit | Attending: Infectious Diseases | Admitting: Infectious Diseases

## 2017-05-25 DIAGNOSIS — B1710 Acute hepatitis C without hepatic coma: Secondary | ICD-10-CM

## 2017-05-29 ENCOUNTER — Ambulatory Visit: Payer: Medicare HMO | Admitting: Interventional Cardiology

## 2017-05-30 NOTE — Progress Notes (Signed)
Cardiology Office Note   Date:  05/31/2017   ID:  John Dickerson, DOB 04/22/1961, MRN [id number]  PCP:  John Salina, MD    No chief complaint on file. CAD   Wt Readings from Last 3 Encounters:  05/31/17 190 lb 12.8 oz (86.5 kg)  05/23/17 189 lb (85.7 kg)  05/18/17 188 lb 14.4 oz (85.7 kg)       History of Present Illness: John Dickerson is a 56 y.o. male  with history of CAD (remote LAD stent 1990s, s/p DESx2 to LAD 11/2015) HIV, DM, HTN, HLD, gout, anemia.    Outpatient cath was done in June 2017 demonstrating 80% mid LAD (in area previously treated with a stent) and 80% distal LAD, which were both treated with DES. He had residual nonobstructive disease otherwise - 50% ramus, 80% OM1, 70% ost LPDA, 50% mid-distal LAD. LVEF was normal. He was started on Plavix. Dr. Haroldine Dickerson discussed statin therapy with ID who cleared the patient for atorvastatin 67m daily. LFTs were normal 12/29/15. LDL in 10/2015 was 99. Of note, Hgb in 2016 was 15-16. In 2017 it has run in the 9-11 range.  He continues to exercise.  Treadmill, weights and swimming, 4-5x/week.    Denies : Chest pain. Dizziness. Leg edema. Nitroglycerin use. Orthopnea. Palpitations. Paroxysmal nocturnal dyspnea. Shortness of breath. Syncope.   No bleeding problems.   A1C 5.6 in June 2018.  HIV well controlled.    BP at home have been well controlled.    Diet could improve.  He does eat some fast food, 1-2 x/week.    Past Medical History:  Diagnosis Date  . Anemia   . Arthritis    "feet" (11/19/2015)  . CAD (coronary artery disease), native coronary artery    a. remote LAD stent 1990s. b. s/p DESx2 to LAD 11/2015  . Hepatitis C    "going thru the treatments" (11/19/2015)  . High cholesterol   . History of gout    "just recently" (11/19/2015)  . HIV [place] immunodeficiency virus infection) ([place]   . Hypertension   . Type II diabetes mellitus ([place]    diagnosed 04-2015    Past Surgical  History:  Procedure Laterality Date  . CARDIAC CATHETERIZATION N/A 11/19/2015   Procedure: Left Heart Cath and Coronary Angiography;  Surgeon: DJolaine Artist MD;  [facility];  Service: Cardiovascular;  Laterality: N/A;  . CARDIAC CATHETERIZATION N/A 11/19/2015   Procedure: Coronary Stent Intervention;  Surgeon: JJettie Booze MD;  [facility];  Service: Cardiovascular;  Laterality: N/A;  . CORONARY ANGIOPLASTY WITH STENT PLACEMENT  11/19/2015   "2 stents"     Current Outpatient Medications  Medication Sig Dispense Refill  . allopurinol (ZYLOPRIM) 100 MG tablet Take 1.5 tablets (150 mg total) by mouth daily. 135 tablet 1  . amLODipine (NORVASC) 10 MG tablet TAKE 1 TABLET BY MOUTH DAILY 90 tablet 0  . aspirin EC 81 MG tablet Take 1 tablet (81 mg total) by mouth daily. 90 tablet 3  . atorvastatin (LIPITOR) 20 MG tablet Take 1 tablet (20 mg total) by mouth daily. 90 tablet 3  . bictegravir-emtricitabine-tenofovir AF (BIKTARVY) 50-200-25 MG TABS tablet Take 1 tablet by mouth daily. 90 tablet 3  . blood glucose meter kit and supplies KIT Dispense based on patient and insurance preference. Use up to four times daily as directed. (FOR ICD-9 250.00, 250.01). 1 each 0  . Blood Glucose Monitoring Suppl (ONETOUCH VERIO  FLEX SYSTEM) w/Device KIT 1 each by Does not apply route daily. 1 kit 0  . clopidogrel (PLAVIX) 75 MG tablet TAKE 1 TABLET(75MG TOTAL) BY MOUTH DAILY 90 tablet 0  . glucose blood (ONETOUCH VERIO) test strip Check blood sugar up to 1 time a day 100 each 12  . metFORMIN (GLUCOPHAGE) 1000 MG tablet Take 1 tablet (1,000 mg total) by mouth daily with breakfast.    . metoprolol succinate (TOPROL-XL) 100 MG 24 hr tablet Take 100 mg by mouth daily. Take with or immediately following a meal.    . nitroGLYCERIN (NITROSTAT) 0.4 MG SL tablet Place 1 tablet (0.4 mg total) under the tongue every 5 (five) minutes as needed for chest pain. 25 tablet 3  . ONETOUCH  DELICA LANCETS FINE MISC Check blood sugar up to 1 time a day 100 each 12  . spironolactone (ALDACTONE) 25 MG tablet Take 1 tablet (25 mg total) by mouth daily. 30 tablet 3  . losartan (COZAAR) 100 MG tablet Take 1 tablet (100 mg total) by mouth daily. 90 tablet 3   No current facility-administered medications for this visit.     Allergies:   Patient has no known allergies.    Social History:  The patient  reports that he quit smoking about 2 years ago. His smoking use included cigarettes. He started smoking about 4 years ago. He has a 5.88 pack-year smoking history. he has never used smokeless tobacco. He reports that he does not drink alcohol or use drugs.   Family History:  The patient's family history includes Cancer in his mother; Diabetes in his mother; Hypertension in his father.    ROS:  Please see the history of present illness.   Mild ankle swelling.  All other systems are reviewed and negative.    PHYSICAL EXAM: VS:  BP 130/72   Pulse 80   Ht 5' 4.5" (1.638 m)   Wt 190 lb 12.8 oz (86.5 kg)   SpO2 96%   BMI 32.24 kg/m  , BMI Body mass index is 32.24 kg/m. GEN: Well nourished, well developed, in no acute distress  HEENT: normal  Neck: no JVD, carotid bruits, or masses Cardiac: RRR; no murmurs, rubs, or gallops,no edema  Respiratory:  clear to auscultation bilaterally, normal work of breathing GI: soft, nontender, nondistended, + BS MS: no deformity or atrophy  Skin: warm and dry, no rash Neuro:  Strength and sensation are intact Psych: euthymic mood, full affect    Recent Labs: 05/15/2017: ALT 27; BUN 27; Creat 1.24; Hemoglobin 13.3; Platelets 332; Potassium 4.8; Sodium 135   Lipid Panel    Component Value Date/Time   CHOL 205 (H) 05/15/2017 0850   CHOL 158 11/03/2015 1013   TRIG 116 05/15/2017 0850   HDL 41 05/15/2017 0850   HDL 39 (L) 11/03/2015 1013   CHOLHDL 5.0 (H) 05/15/2017 0850   VLDL 9 03/22/2016 1155   LDLCALC 80 03/22/2016 1155   LDLCALC 99  11/03/2015 1013     Other studies Reviewed: Additional studies/ records that were reviewed today with results demonstrating: Cr  1.24 in 11/18.   ASSESSMENT AND PLAN:  1. CAD: LAD PCI in 2017.  Residual CAD managed medically. Chronic, mild DOE with walking up hills reported in the past has resolved. 2. Hypertension: BP well controlled.  Edema could be related to amlodipine but is not bothersome, so would not change meds. 3. Hyperlipidemia: TC on 11/18 was 205.  LDL from 2017 was controlled.  LDL increased to  141 in 11/18. Started atorvastatin 20, will increase it to 40 mg daily if LDL not improved.   4. HIV: Followed by Dr. Johnnye Sima.  Under control per his report.    Current medicines are reviewed at length with the patient today.  The patient concerns regarding his medicines were addressed.  The following changes have been made:  No change  Labs/ tests ordered today include:  No orders of the defined types were placed in this encounter.   Recommend 150 minutes/week of aerobic exercise Low fat, low carb, high fiber diet recommended  Disposition:   FU in 6 months   Signed, Larae Grooms, MD  05/31/2017 9:06 AM    [facility] [address], [place], [place]  [address] Phone: [phone]; Fax: [phone]

## 2017-05-31 ENCOUNTER — Telehealth: Payer: Self-pay | Admitting: Interventional Cardiology

## 2017-05-31 ENCOUNTER — Ambulatory Visit (INDEPENDENT_AMBULATORY_CARE_PROVIDER_SITE_OTHER): Payer: Medicare HMO | Admitting: Interventional Cardiology

## 2017-05-31 ENCOUNTER — Encounter: Payer: Self-pay | Admitting: Interventional Cardiology

## 2017-05-31 VITALS — BP 130/72 | HR 80 | Ht 64.5 in | Wt 190.8 lb

## 2017-05-31 DIAGNOSIS — I10 Essential (primary) hypertension: Secondary | ICD-10-CM

## 2017-05-31 DIAGNOSIS — E782 Mixed hyperlipidemia: Secondary | ICD-10-CM

## 2017-05-31 DIAGNOSIS — R0609 Other forms of dyspnea: Secondary | ICD-10-CM | POA: Diagnosis not present

## 2017-05-31 DIAGNOSIS — I2510 Atherosclerotic heart disease of native coronary artery without angina pectoris: Secondary | ICD-10-CM | POA: Diagnosis not present

## 2017-05-31 DIAGNOSIS — Z959 Presence of cardiac and vascular implant and graft, unspecified: Secondary | ICD-10-CM

## 2017-05-31 DIAGNOSIS — Z95820 Peripheral vascular angioplasty status with implants and grafts: Secondary | ICD-10-CM

## 2017-05-31 NOTE — Telephone Encounter (Signed)
Attempted to return call to patient, but there was no answer and VM was full. Will try again at another time.

## 2017-05-31 NOTE — Patient Instructions (Signed)

## 2017-05-31 NOTE — Telephone Encounter (Signed)
New message  Pt verbalized that he is returning call for the rn   He want rn to go over all medications

## 2017-06-01 NOTE — Telephone Encounter (Signed)
John Dickerson is calling about his Atorvastatin , states he was supposed to call back to give the dosage in which he was taking, but he says he is not taking that medication . Please call at [phone]

## 2017-06-01 NOTE — Telephone Encounter (Signed)
Attempted to return call to patient, but there was no answer and his VM was full. Will try again at another time.

## 2017-06-04 ENCOUNTER — Encounter: Payer: Self-pay | Admitting: *Deleted

## 2017-06-04 NOTE — Telephone Encounter (Signed)
Attempted to return call to patient again, but there was no answer and his VM was full. Will try again at another time.

## 2017-06-04 NOTE — Progress Notes (Signed)
06-04-17  Received IFOBT cards by mail today. Kit post marked 05-20-17.  Unable to analyze due to age of specimen.  Notified Dr Benjamine Mola. He will address recollection at next office visit.  We will then give MR. Herda a new OFOBT kit and review instructions.  Maryan Rued, PBT 06-04-17  14:42

## 2017-06-07 ENCOUNTER — Telehealth: Payer: Self-pay | Admitting: Interventional Cardiology

## 2017-06-07 NOTE — Telephone Encounter (Signed)
°  New Prob   *STAT* If patient is at the pharmacy, call can be transferred to refill team.   1. Which medications need to be refilled? (please list name of each medication and dose if known)  atorvastatin (LIPITOR) 20 MG tablet  2. Which pharmacy/location (including street and city if local pharmacy) is medication to be sent to? [facility] on [address]  3. Do they need a 30 day or 90 day supply? 90    States medication was sent to the wrong pharmacy.

## 2017-06-07 NOTE — Telephone Encounter (Signed)
Called pt to inform him that his medication was at [facility] on [address] and that he can request that it be transferred to [facility] and that pt has a year supply of this medication as well. I informed the pt that if he has any other problems, questions or concerns to please call our office. Pt verbalized understanding.

## 2017-06-07 NOTE — Telephone Encounter (Signed)
Tried to call patient again but there was no answer and VM was full.

## 2017-06-07 NOTE — Telephone Encounter (Signed)
Called and spoke to patient on his cell phone. Patient states that he has not started taking the atorvastatin that was prescribed to him by Dr. Johnnye Sima on 05/23/17. Patient states that he did not know that he was suppose to be taking it. Instructed on the importance of starting the medicine. Made patient aware that Dr. Johnnye Sima sent the Rx for atorvastatin 20 mg QD to the [facility] on [address]. Patient states that he is going to call and check to see if they have it and if they do he is going to start taking it. Patient states that if they do not have it he will call back for me to send it for him. Patient is already scheduled for lab recheck in February. Patient thanked me for the call.

## 2017-06-10 ENCOUNTER — Other Ambulatory Visit: Payer: Self-pay | Admitting: Pharmacist

## 2017-06-18 ENCOUNTER — Other Ambulatory Visit: Payer: Self-pay | Admitting: Internal Medicine

## 2017-06-18 DIAGNOSIS — N521 Erectile dysfunction due to diseases classified elsewhere: Principal | ICD-10-CM

## 2017-06-18 DIAGNOSIS — E1159 Type 2 diabetes mellitus with other circulatory complications: Secondary | ICD-10-CM

## 2017-06-21 ENCOUNTER — Other Ambulatory Visit: Payer: Self-pay | Admitting: Internal Medicine

## 2017-06-21 DIAGNOSIS — N521 Erectile dysfunction due to diseases classified elsewhere: Principal | ICD-10-CM

## 2017-06-21 DIAGNOSIS — E1159 Type 2 diabetes mellitus with other circulatory complications: Secondary | ICD-10-CM

## 2017-06-22 ENCOUNTER — Telehealth: Payer: Self-pay | Admitting: *Deleted

## 2017-06-22 NOTE — Telephone Encounter (Signed)
[facility] calls and states they have not gotten metformin script yet, it was done 1/3, rep states "sometimes they dont come through" gave VO for metformin.

## 2017-07-18 ENCOUNTER — Other Ambulatory Visit: Payer: Self-pay | Admitting: Internal Medicine

## 2017-07-18 DIAGNOSIS — I10 Essential (primary) hypertension: Secondary | ICD-10-CM

## 2017-07-25 ENCOUNTER — Ambulatory Visit (INDEPENDENT_AMBULATORY_CARE_PROVIDER_SITE_OTHER): Payer: Medicare HMO | Admitting: Pharmacist Clinician (PhC)/ Clinical Pharmacy Specialist

## 2017-07-25 DIAGNOSIS — E782 Mixed hyperlipidemia: Secondary | ICD-10-CM

## 2017-07-25 LAB — COMPLETE METABOLIC PANEL WITH GFR
AG RATIO: 1.4 (calc) (ref 1.0–2.5)
ALBUMIN MSPROF: 4.4 g/dL (ref 3.6–5.1)
ALKALINE PHOSPHATASE (APISO): 75 U/L (ref 40–115)
ALT: 26 U/L (ref 9–46)
AST: 26 U/L (ref 10–35)
BUN / CREAT RATIO: 21 (calc) (ref 6–22)
BUN: 27 mg/dL — ABNORMAL HIGH (ref 7–25)
CALCIUM: 9.9 mg/dL (ref 8.6–10.3)
CO2: 26 mmol/L (ref 20–32)
CREATININE: 1.31 mg/dL (ref 0.70–1.33)
Chloride: 104 mmol/L (ref 98–110)
GFR, EST NON AFRICAN AMERICAN: 60 mL/min/{1.73_m2} (ref >=60)
GFR, Est African American: 69 mL/min/{1.73_m2} (ref >=60)
GLOBULIN: 3.2 g/dL (ref 1.9–3.7)
Glucose, Bld: 159 mg/dL — ABNORMAL HIGH (ref 65–99)
POTASSIUM: 4.7 mmol/L (ref 3.5–5.3)
SODIUM: 138 mmol/L (ref 135–146)
Total Bilirubin: 0.5 mg/dL (ref 0.2–1.2)
Total Protein: 7.6 g/dL (ref 6.1–8.1)

## 2017-07-25 LAB — LIPID PANEL
CHOL/HDL RATIO: 3.3 (calc) (ref <5.0)
Cholesterol: 137 mg/dL (ref <200)
HDL: 41 mg/dL (ref >40)
LDL Cholesterol (Calc): 77 mg/dL (calc)
NON-HDL CHOLESTEROL (CALC): 96 mg/dL (ref <130)
Triglycerides: 105 mg/dL (ref <150)

## 2017-07-25 NOTE — Progress Notes (Signed)
HPI: MANNIE OHLIN is a 57 y.o. male who is here for his visit with pharmacy for his hyperlipidemia.   Allergies: No Known Allergies  Vitals:    Past Medical History: Past Medical History:  Diagnosis Date  . Anemia   . Arthritis    "feet" (11/19/2015)  . CAD (coronary artery disease), native coronary artery    a. remote LAD stent 1990s. b. s/p DESx2 to LAD 11/2015  . Hepatitis C    "going thru the treatments" (11/19/2015)  . High cholesterol   . History of gout    "just recently" (11/19/2015)  . HIV (human immunodeficiency virus infection) ([place])   . Hypertension   . Type II diabetes mellitus ([place])    diagnosed 04-2015    Social History: Social History   Socioeconomic History  . Marital status: Married    Spouse name: Not on file  . Number of children: Not on file  . Years of education: Not on file  . Highest education level: Not on file  Social Needs  . Financial resource strain: Not on file  . Food insecurity - worry: Not on file  . Food insecurity - inability: Not on file  . Transportation needs - medical: Not on file  . Transportation needs - non-medical: Not on file  Occupational History  . Not on file  Tobacco Use  . Smoking status: Former Smoker    Packs/day: 0.12    Years: 49.00    Pack years: 5.88    Types: Cigarettes    Start date: 07/20/2012    Last attempt to quit: 02/18/2015    Years since quitting: 2.4  . Smokeless tobacco: Never Used  Substance and Sexual Activity  . Alcohol use: No    Alcohol/week: 0.0 oz  . Drug use: No  . Sexual activity: Yes    Partners: Female    Comment: pt. given condoms  Other Topics Concern  . Not on file  Social History Narrative  . Not on file    Previous Regimen: ATP, Isentress/Descovy  Current Regimen: Biktarvy  Labs: HIV 1 RNA Quant (copies/mL)  Date Value  05/15/2017 <20 DETECTED (A)  09/06/2016 <20 NOT DETECTED  03/22/2016 <20   CD4 T Cell Abs (/uL)  Date Value  05/15/2017 540  09/06/2016 820   03/22/2016 550   Hep B S Ab (no units)  Date Value  08/13/2006 YES   Hepatitis B Surface Ag (no units)  Date Value  08/13/2006 No   HCV Ab (no units)  Date Value  08/13/2006 YES    CrCl: CrCl cannot be calculated (Patient's most recent lab result is older than the maximum 21 days allowed.).  Lipids:    Component Value Date/Time   CHOL 205 (H) 05/15/2017 0850   CHOL 158 11/03/2015 1013   TRIG 116 05/15/2017 0850   HDL 41 05/15/2017 0850   HDL 39 (L) 11/03/2015 1013   CHOLHDL 5.0 (H) 05/15/2017 0850   VLDL 9 03/22/2016 1155   LDLCALC 80 03/22/2016 1155   LDLCALC 99 11/03/2015 1013    Assessment: Shey was recently started on Lipitor for his his of CAD. His LDL was >100 in Nov. Dr. Johnnye Sima placed him on Lipitor. He is here to get his f/u labs to monitor him while he is on therapy. He has not missed any doses or having side effects with Lipitor. He continues to do well on [place]. He is now cured of his hep C.   Recommendations:  Continue [place]  1 daily Continue Lipitor 20mg  PO qday Lipids and cmet today  Onnie Boer, PharmD, BCPS, AAHIVP, CPP Clinical Infectious [place] 07/25/2017, 8:55 AM

## 2017-07-25 NOTE — Patient Instructions (Signed)
Continue your Lipitor (atorvastatin)

## 2017-08-02 ENCOUNTER — Other Ambulatory Visit: Payer: Self-pay | Admitting: Internal Medicine

## 2017-08-02 DIAGNOSIS — I10 Essential (primary) hypertension: Secondary | ICD-10-CM

## 2017-08-06 ENCOUNTER — Encounter: Payer: Self-pay | Admitting: Infectious Diseases

## 2017-08-17 ENCOUNTER — Other Ambulatory Visit: Payer: Self-pay | Admitting: Internal Medicine

## 2017-08-17 DIAGNOSIS — I10 Essential (primary) hypertension: Secondary | ICD-10-CM

## 2017-09-14 ENCOUNTER — Encounter: Payer: Self-pay | Admitting: Internal Medicine

## 2017-09-14 ENCOUNTER — Ambulatory Visit (INDEPENDENT_AMBULATORY_CARE_PROVIDER_SITE_OTHER): Payer: Medicare HMO | Admitting: Internal Medicine

## 2017-09-14 ENCOUNTER — Other Ambulatory Visit: Payer: Self-pay | Admitting: Internal Medicine

## 2017-09-14 ENCOUNTER — Other Ambulatory Visit: Payer: Self-pay

## 2017-09-14 VITALS — BP 122/75 | HR 72 | Temp 98.4°F | Ht 64.5 in | Wt 180.7 lb

## 2017-09-14 DIAGNOSIS — M10072 Idiopathic gout, left ankle and foot: Secondary | ICD-10-CM

## 2017-09-14 DIAGNOSIS — E1159 Type 2 diabetes mellitus with other circulatory complications: Secondary | ICD-10-CM

## 2017-09-14 DIAGNOSIS — Z7984 Long term (current) use of oral hypoglycemic drugs: Secondary | ICD-10-CM | POA: Diagnosis not present

## 2017-09-14 DIAGNOSIS — N521 Erectile dysfunction due to diseases classified elsewhere: Secondary | ICD-10-CM | POA: Diagnosis not present

## 2017-09-14 DIAGNOSIS — Z79899 Other long term (current) drug therapy: Secondary | ICD-10-CM

## 2017-09-14 DIAGNOSIS — I10 Essential (primary) hypertension: Secondary | ICD-10-CM | POA: Diagnosis not present

## 2017-09-14 DIAGNOSIS — E781 Pure hyperglyceridemia: Secondary | ICD-10-CM

## 2017-09-14 DIAGNOSIS — K5900 Constipation, unspecified: Secondary | ICD-10-CM | POA: Diagnosis not present

## 2017-09-14 LAB — GLUCOSE, CAPILLARY: GLUCOSE-CAPILLARY: 77 mg/dL (ref 65–99)

## 2017-09-14 LAB — POCT GLYCOSYLATED HEMOGLOBIN (HGB A1C): Hemoglobin A1C: 7.1

## 2017-09-14 MED ORDER — METFORMIN HCL 1000 MG PO TABS: 1000.0000 mg | ORAL_TABLET | Freq: Two times a day (BID) | ORAL | 1 refills | Status: DC

## 2017-09-14 NOTE — Patient Instructions (Signed)
You are doing a great job staying active and healthy!  Your hemoglobin A1c is just slightly above goal at 7.1% today. I recommend going back up to twice daily metformin 1,000mg  otherwise no new changes.  We can check your cholesterol level at the next lab visit in May or June to decide if any change is needed with the atorvastatin.  I recommend miralax daily as needed as a good OTC constipation treatment.

## 2017-09-14 NOTE — Progress Notes (Signed)
   CC: Follow up for diabetes  HPI:  John Dickerson is a 57 y.o. male with PMHx detailed below presenting for follow up of his diabetes after medication adjustment in November. He has been feeling very well over the interval without any new health complaints. His diet is about the same. HE continues 3-4 days per week of regular exercise. He is not checking home CBGs and reports no episodes of symptomatic hypoglycemia. He continues to have mild visual impairment from cataracts without new change.  See problem based assessment and plan below for additional details.  Type 2 diabetes mellitus with circulatory disorder causing erectile dysfunction (HCC) Hgb A1c is 7.1% today after discontinuing glipizide and decreasing metformin. This is slightly above goal of 7.0. He is not having any symptomatic episodes of hypoglycemia. His metformin is increased back to 1,000 mg twice daily and we can follow up in about 3 months.  Essential hypertension Blood pressure is well controlled today at 122/75 on losartan 100, amlodipine 10, metoprolol 100. Recent lab work from speciality clinic visit in February looks good. Continue the current medications.  Hypertriglyceridemia Reordered atorvastatin 20mg . We can check lipid profile at his next follow up and continue versus titrate to high intensity based on response to treatment or not.   Past Medical History:  Diagnosis Date  . Anemia   . Arthritis    "feet" (11/19/2015)  . CAD (coronary artery disease), native coronary artery    a. remote LAD stent 1990s. b. s/p DESx2 to LAD 11/2015  . Hepatitis C    "going thru the treatments" (11/19/2015)  . High cholesterol   . History of gout    "just recently" (11/19/2015)  . HIV (human immunodeficiency virus infection) ([place])   . Hypertension   . Type II diabetes mellitus ([place])    diagnosed 04-2015    Review of Systems: Review of Systems  Constitutional: Negative for fever.  Eyes: Positive for blurred  vision.  Respiratory: Negative for shortness of breath.   Cardiovascular: Negative for leg swelling.  Gastrointestinal: Negative for diarrhea.  Genitourinary: Negative for dysuria.  Skin: Negative for rash.  Neurological: Negative for dizziness.  Endo/Heme/Allergies: Negative for polydipsia.     Physical Exam: Vitals:   09/14/17 1338  BP: 122/75  Pulse: 72  Temp: 98.4 F (36.9 C)  TempSrc: Oral  SpO2: 99%  Weight: 180 lb 11.2 oz (82 kg)  Height: 5' 4.5" (1.638 m)   GENERAL- alert, co-operative, NAD HEENT- Atraumatic, PERRL, EOMI, oral mucosa appears moist, no cervical LN enlargement. CARDIAC- RRR, no murmurs, rubs or gallops. RESP- CTAB, no wheezes or crackles. ABDOMEN- Soft, nontender, no guarding or rebound BACK- Normal curvature, no paraspinal tenderness, no CVA tenderness. EXTREMITIES- pulse 2+, symmetric, no pedal edema. SKIN- Warm, dry, No rash or lesion. PSYCH- Normal mood and affect, appropriate thought content and speech.    Assessment & Plan:   See encounters tab for problem based medical decision making.   Patient discussed with Dr. Lynnae January

## 2017-09-16 ENCOUNTER — Other Ambulatory Visit: Payer: Self-pay | Admitting: Internal Medicine

## 2017-09-16 DIAGNOSIS — E1159 Type 2 diabetes mellitus with other circulatory complications: Secondary | ICD-10-CM

## 2017-09-16 DIAGNOSIS — N521 Erectile dysfunction due to diseases classified elsewhere: Principal | ICD-10-CM

## 2017-09-17 NOTE — Assessment & Plan Note (Addendum)
Blood pressure is well controlled today at 122/75 on losartan 100, amlodipine 10, metoprolol 100. Recent lab work from speciality clinic visit in February looks good. Continue the current medications.

## 2017-09-17 NOTE — Assessment & Plan Note (Signed)
Reordered atorvastatin 20mg . We can check lipid profile at his next follow up and continue versus titrate to high intensity based on response to treatment or not.

## 2017-09-17 NOTE — Assessment & Plan Note (Signed)
Hgb A1c is 7.1% today after discontinuing glipizide and decreasing metformin. This is slightly above goal of 7.0. He is not having any symptomatic episodes of hypoglycemia. His metformin is increased back to 1,000 mg twice daily and we can follow up in about 3 months.

## 2017-09-18 NOTE — Progress Notes (Signed)
Internal Medicine Clinic Attending  Case discussed with Dr. Rice at the time of the visit.  We reviewed the resident's history and exam and pertinent patient test results.  I agree with the assessment, diagnosis, and plan of care documented in the resident's note.  

## 2017-09-19 ENCOUNTER — Other Ambulatory Visit: Payer: Self-pay | Admitting: Interventional Cardiology

## 2017-10-15 ENCOUNTER — Other Ambulatory Visit: Payer: Self-pay | Admitting: Internal Medicine

## 2017-10-16 ENCOUNTER — Other Ambulatory Visit: Payer: Self-pay | Admitting: *Deleted

## 2017-10-16 DIAGNOSIS — I10 Essential (primary) hypertension: Secondary | ICD-10-CM

## 2017-10-16 MED ORDER — AMLODIPINE BESYLATE 10 MG PO TABS: 10.0000 mg | ORAL_TABLET | Freq: Every day | ORAL | 1 refills | Status: DC

## 2017-10-30 ENCOUNTER — Telehealth: Payer: Self-pay | Admitting: Behavioral Health

## 2017-10-30 ENCOUNTER — Other Ambulatory Visit: Payer: Self-pay | Admitting: Behavioral Health

## 2017-10-30 DIAGNOSIS — E781 Pure hyperglyceridemia: Secondary | ICD-10-CM

## 2017-10-30 DIAGNOSIS — B20 Human immunodeficiency virus [HIV] disease: Secondary | ICD-10-CM

## 2017-10-30 MED ORDER — ATORVASTATIN CALCIUM 20 MG PO TABS: 20.0000 mg | ORAL_TABLET | Freq: Every day | ORAL | 0 refills | Status: DC

## 2017-10-30 MED ORDER — BICTEGRAVIR-EMTRICITAB-TENOFOV 50-200-25 MG PO TABS: 1.0000 | ORAL_TABLET | Freq: Every day | ORAL | 0 refills | Status: DC

## 2017-10-30 NOTE — Progress Notes (Signed)
Called patient and informed him his Biktarvy and Lipitor was sent to the [facility] on [address].  Patient verbalized understanding. Pricilla Riffle RN

## 2017-10-30 NOTE — Telephone Encounter (Signed)
-----   Message from [place] sent at 10/30/2017  3:45 PM EDT ----- Contact: self Patient called requesting medication to go to a different pharmacy. [facility] pharmacy is : [facility] [place] [employer] [place]

## 2017-11-21 ENCOUNTER — Other Ambulatory Visit (HOSPITAL_COMMUNITY)
Admission: RE | Admit: 2017-11-21 | Discharge: 2017-11-21 | Disposition: A | Discharge status: Home / Self Care | Payer: Medicare HMO | Source: Ambulatory Visit | Attending: Infectious Diseases | Admitting: Infectious Diseases

## 2017-11-21 ENCOUNTER — Encounter: Payer: Self-pay | Admitting: Infectious Diseases

## 2017-11-21 ENCOUNTER — Ambulatory Visit (INDEPENDENT_AMBULATORY_CARE_PROVIDER_SITE_OTHER): Payer: Medicare HMO | Admitting: Infectious Diseases

## 2017-11-21 VITALS — BP 131/85 | HR 57 | Temp 98.1°F | Ht 64.0 in | Wt 179.0 lb

## 2017-11-21 DIAGNOSIS — B20 Human immunodeficiency virus [HIV] disease: Secondary | ICD-10-CM | POA: Diagnosis not present

## 2017-11-21 DIAGNOSIS — B1710 Acute hepatitis C without hepatic coma: Secondary | ICD-10-CM | POA: Diagnosis not present

## 2017-11-21 DIAGNOSIS — I10 Essential (primary) hypertension: Secondary | ICD-10-CM

## 2017-11-21 DIAGNOSIS — Z23 Encounter for immunization: Secondary | ICD-10-CM | POA: Diagnosis not present

## 2017-11-21 DIAGNOSIS — Z79899 Other long term (current) drug therapy: Secondary | ICD-10-CM

## 2017-11-21 DIAGNOSIS — E781 Pure hyperglyceridemia: Secondary | ICD-10-CM | POA: Diagnosis not present

## 2017-11-21 NOTE — Assessment & Plan Note (Signed)
Doing well, asx Well controlled today.

## 2017-11-21 NOTE — Progress Notes (Signed)
   Subjective:    Patient ID: John Dickerson, male    DOB: Jun 30, 1960, 57 y.o.   MRN: [id number]  HPI 57yo M with hx of HIV+ was Dx 1998, DM2,and HTN. Previous Hep C 1a. 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  He was on atripla til changed to [facility] for Hep C rx.  He was started on zepatier/sovaldi/rib (plan 16 weeks) on 2-25after he took harvoni for short course. His ART was changed to [place] at that time. He was changed to biktarvy 11-2016. Repeat Hep C RNAnegative(7-17 and 3-18).  He has had f/u at [facility] for his DM.  Has been doing well. "More pills".  FSG have been "pretty good". High: "i'm not sure", low: ".. I haven't been doing it for awhile".    HIV 1 RNA Quant (copies/mL)  Date Value  05/15/2017 <20 DETECTED (A)  09/06/2016 <20 NOT DETECTED  03/22/2016 <20   CD4 T Cell Abs (/uL)  Date Value  05/15/2017 540  09/06/2016 820  03/22/2016 550    Review of Systems  Constitutional: Negative for appetite change and unexpected weight change.  Respiratory: Negative for shortness of breath.   Cardiovascular: Negative for chest pain.  Gastrointestinal: Negative for constipation and diarrhea.  Genitourinary: Negative for difficulty urinating.  Neurological: Negative for headaches.  has not had ophtho yet this year. Can't have cataract surgery til he turns 25.  Has not had colon.  Please see HPI. All other systems reviewed and negative.      Objective:   Physical Exam  Constitutional: He appears well-developed and well-nourished.  HENT:  Mouth/Throat: No oropharyngeal exudate.  Eyes: Pupils are equal, round, and reactive to light. EOM are normal.  Neck: Normal range of motion. Neck supple.  Cardiovascular: Normal rate, regular rhythm and normal heart sounds.  Pulses:      Dorsalis pedis pulses are 3+ on the right side.       Posterior tibial pulses are 3+ on the left side.  Pulmonary/Chest: Effort normal and breath sounds normal.  Abdominal: Soft. Bowel sounds  are normal. He exhibits no distension. There is no tenderness.  Musculoskeletal: Normal range of motion. He exhibits no edema.  Skin: Skin is warm and dry.  Psychiatric: He has a normal mood and affect.  no diabetic foot lesions, normal light touch.      Assessment & Plan:

## 2017-11-21 NOTE — Assessment & Plan Note (Signed)
On lipitor Appreciate [facility] f/u Lat LFTs normal

## 2017-11-21 NOTE — Addendum Note (Signed)
Addended by: Pola Corn on: 11/21/2017 09:39 AM   Modules accepted: Orders

## 2017-11-21 NOTE — Assessment & Plan Note (Signed)
Last u/s 05-2017 no mass.  Consider recheck in 1 year.

## 2017-11-21 NOTE — Assessment & Plan Note (Signed)
Will check his labs today Offered/refused condoms.  Was not able to complete colonoscopy. Will f/u with IM for cologaurd, other testing.  prevnar today.  rtc in 9 months.

## 2017-11-22 LAB — T-HELPER CELL (CD4) - (RCID CLINIC ONLY)
CD4 % Helper T Cell: 25 % — ABNORMAL LOW (ref 33–55)
CD4 T Cell Abs: 640 /uL (ref 400–2700)

## 2017-11-22 LAB — URINE CYTOLOGY ANCILLARY ONLY
CHLAMYDIA, DNA PROBE: NEGATIVE
Neisseria Gonorrhea: NEGATIVE

## 2017-11-22 LAB — RPR: RPR: NONREACTIVE

## 2017-11-23 LAB — HIV-1 RNA QUANT-NO REFLEX-BLD
HIV 1 RNA QUANT: NOT DETECTED {copies}/mL
HIV-1 RNA Quant, Log: <1.3 Log copies/mL

## 2017-12-06 ENCOUNTER — Encounter: Payer: Self-pay | Admitting: Interventional Cardiology

## 2017-12-06 ENCOUNTER — Encounter (INDEPENDENT_AMBULATORY_CARE_PROVIDER_SITE_OTHER): Payer: Self-pay

## 2017-12-06 ENCOUNTER — Ambulatory Visit (INDEPENDENT_AMBULATORY_CARE_PROVIDER_SITE_OTHER): Payer: Medicare HMO | Admitting: Interventional Cardiology

## 2017-12-06 VITALS — BP 138/78 | HR 63 | Ht 64.0 in | Wt 181.0 lb

## 2017-12-06 DIAGNOSIS — Z95820 Peripheral vascular angioplasty status with implants and grafts: Secondary | ICD-10-CM | POA: Diagnosis not present

## 2017-12-06 DIAGNOSIS — E782 Mixed hyperlipidemia: Secondary | ICD-10-CM

## 2017-12-06 DIAGNOSIS — I2510 Atherosclerotic heart disease of native coronary artery without angina pectoris: Secondary | ICD-10-CM | POA: Diagnosis not present

## 2017-12-06 DIAGNOSIS — I10 Essential (primary) hypertension: Secondary | ICD-10-CM

## 2017-12-06 NOTE — Progress Notes (Signed)
Cardiology Office Note   Date:  12/06/2017   ID:  RAMESES OU, DOB 08/15/1960, MRN [id number]  PCP:  Collier Salina, MD    No chief complaint on file.  CAD  Wt Readings from Last 3 Encounters:  12/06/17 181 lb (82.1 kg)  11/21/17 179 lb (81.2 kg)  09/14/17 180 lb 11.2 oz (82 kg)       History of Present Illness: John Dickerson is a 57 y.o. male  with history of CAD (remote LAD stent 1990s, s/p DESx2 to LAD 11/2015) HIV, DM, HTN, HLD, gout, anemia. Angina occurred with mowing the lawn.  Outpatient cath was done in June 2017demonstrating 80% mid LAD (in area previously treated with a stent) and 80% distal LAD, which were both treated with DES. He had residual nonobstructive disease otherwise - 50% ramus, 80% OM1, 70% ost LPDA, 50% mid-distal LAD. LVEF was normal. He was started on Plavix. Dr. Haroldine Laws discussed statin therapy with ID who cleared the patient for atorvastatin 5m daily. LFTs were normal 12/29/15. LDL in 10/2015 was 99. Of note, Hgb in 2016 was 15-16. In 2017 it has run in the 9-11 range.  Denies : Chest pain. Dizziness. Leg edema. Nitroglycerin use. Orthopnea. Palpitations. Paroxysmal nocturnal dyspnea. Shortness of breath. Syncope.   He continues to workout daily on the treadmill and lift light weights.  He swims after that.  Improved lifestyle after the recent stents.  HIV still well controlled.  He has some sx when his blood sugar gets low.   His diet is healthy as well.      Past Medical History:  Diagnosis Date  . Anemia   . Arthritis    "feet" (11/19/2015)  . CAD (coronary artery disease), native coronary artery    a. remote LAD stent 1990s. b. s/p DESx2 to LAD 11/2015  . Hepatitis C    "going thru the treatments" (11/19/2015)  . High cholesterol   . History of gout    "just recently" (11/19/2015)  . HIV (human immunodeficiency virus infection) ([place]   . Hypertension   . [place] II diabetes mellitus ([place]    diagnosed 04-2015     Past Surgical History:  Procedure Laterality Date  . CARDIAC CATHETERIZATION N/A 11/19/2015   Procedure: Left Heart Cath and Coronary Angiography;  Surgeon: DJolaine Artist MD;  [facility];  Service: Cardiovascular;  Laterality: N/A;  . CARDIAC CATHETERIZATION N/A 11/19/2015   Procedure: Coronary Stent Intervention;  Surgeon: JJettie Booze MD;  [facility];  Service: Cardiovascular;  Laterality: N/A;  . CORONARY ANGIOPLASTY WITH STENT PLACEMENT  11/19/2015   "2 stents"     Current Outpatient Medications  Medication Sig Dispense Refill  . allopurinol (ZYLOPRIM) 100 MG tablet TAKE 1 1/2 TABLETS BY MOUTH DAILY 135 tablet 0  . amLODipine (NORVASC) 10 MG tablet Take 1 tablet (10 mg total) by mouth daily. 90 tablet 1  . aspirin EC 81 MG tablet Take 1 tablet (81 mg total) by mouth daily. 90 tablet 3  . atorvastatin (LIPITOR) 20 MG tablet Take 1 tablet (20 mg total) by mouth daily. 90 tablet 0  . bictegravir-emtricitabine-tenofovir AF (BIKTARVY) 50-200-25 MG TABS tablet Take 1 tablet by mouth daily. 90 tablet 0  . blood glucose meter kit and supplies KIT Dispense based on patient and insurance preference. Use up to four times daily as directed. (FOR ICD-9 250.00, 250.01). 1 each 0  . Blood Glucose Monitoring Suppl (ONETOUCH VERIO  FLEX SYSTEM) w/Device KIT 1 each by Does not apply route daily. 1 kit 0  . clopidogrel (PLAVIX) 75 MG tablet TAKE 1 TABLET BY MOUTH EVERY DAY 90 tablet 1  . glucose blood (ONETOUCH VERIO) test strip Check blood sugar up to 1 time a day 100 each 12  . metFORMIN (GLUCOPHAGE) 1000 MG tablet Take 1 tablet (1,000 mg total) by mouth 2 (two) times daily with a meal. 180 tablet 1  . metoprolol succinate (TOPROL-XL) 100 MG 24 hr tablet TAKE 1 TABLET BY MOUTH EVERY DAY WITH OR IMMEDIATELY FOLLOWING A MEAL 90 tablet 0  . nitroGLYCERIN (NITROSTAT) 0.4 MG SL tablet Place 1 tablet (0.4 mg total) under the tongue every 5 (five) minutes as  needed for chest pain. 25 tablet 3  . ONETOUCH DELICA LANCETS FINE MISC Check blood sugar up to 1 time a day 100 each 12  . spironolactone (ALDACTONE) 25 MG tablet Take 1 tablet (25 mg total) by mouth daily. 90 tablet 2  . losartan (COZAAR) 100 MG tablet Take 1 tablet (100 mg total) by mouth daily. 90 tablet 3   No current facility-administered medications for this visit.     Allergies:   Patient has no known allergies.    Social History:  The patient  reports that he quit smoking about 2 years ago. His smoking use included cigarettes. He started smoking about 5 years ago. He has a 5.88 pack-year smoking history. He has never used smokeless tobacco. He reports that he does not drink alcohol or use drugs.   Family History:  The patient's family history includes Cancer in his mother; Diabetes in his mother; Hypertension in his father.    ROS:  Please see the history of present illness.   Otherwise, review of systems are positive for intentional weight loss.   All other systems are reviewed and negative.    PHYSICAL EXAM: VS:  BP 138/78   Pulse 63   Ht _0  (1.626 m)   Wt 181 lb (82.1 kg)   SpO2 97%   BMI 31.07 kg/m  , BMI Body mass index is 31.07 kg/m. GEN: Well nourished, well developed, in no acute distress  HEENT: normal  Neck: no JVD, carotid bruits, or masses Cardiac: RRR; no murmurs, rubs, or gallops,no edema  Respiratory:  clear to auscultation bilaterally, normal work of breathing GI: soft, nontender, nondistended, + BS MS: no deformity or atrophy  Skin: warm and dry, no rash Neuro:  Strength and sensation are intact Psych: euthymic mood, full affect   EKG:   The ekg ordered today demonstrates NSR, septal Q waves, no ST changes; ST changes from 2018 have resolved   Recent Labs: 05/15/2017: Hemoglobin 13.3; Platelets 332 07/25/2017: ALT 26; BUN 27; Creat 1.31; Potassium 4.7; Sodium 138   Lipid Panel    Component Value Date/Time   CHOL 137 07/25/2017 0857   CHOL  158 11/03/2015 1013   TRIG 105 07/25/2017 0857   HDL 41 07/25/2017 0857   HDL 39 (L) 11/03/2015 1013   CHOLHDL 3.3 07/25/2017 0857   VLDL 9 03/22/2016 1155   LDLCALC 77 07/25/2017 0857     Other studies Reviewed: Additional studies/ records that were reviewed today with results demonstrating: labs and cath records reviewed.   ASSESSMENT AND PLAN:  1. CAD: No angina on medical therapy.  Continue aggressive secondary prevention. 2. HTN: Controlled outside of MDs office when he checks.   3. Hyperlipidemia: LDL 77 in 2/19. COntinue atorvastatin. 4. HIV: FOllowed  by Dr. Johnnye Sima.  He states disease is wll controlled.    Current medicines are reviewed at length with the patient today.  The patient concerns regarding his medicines were addressed.  The following changes have been made:  No change  Labs/ tests ordered today include:  No orders of the defined types were placed in this encounter.   Recommend 150 minutes/week of aerobic exercise Low fat, low carb, high fiber diet recommended  Disposition:   FU in 1 year   Signed, Larae Grooms, MD  12/06/2017 8:45 AM    [facility] [address], [place], [place]  [address] Phone: [phone]; Fax: [phone]

## 2017-12-06 NOTE — Patient Instructions (Signed)

## 2017-12-15 ENCOUNTER — Other Ambulatory Visit: Payer: Self-pay | Admitting: Interventional Cardiology

## 2017-12-16 ENCOUNTER — Other Ambulatory Visit: Payer: Self-pay | Admitting: Infectious Diseases

## 2017-12-16 DIAGNOSIS — B20 Human immunodeficiency virus [HIV] disease: Secondary | ICD-10-CM

## 2018-01-09 ENCOUNTER — Encounter: Payer: Self-pay | Admitting: *Deleted

## 2018-01-14 ENCOUNTER — Other Ambulatory Visit: Payer: Self-pay | Admitting: Internal Medicine

## 2018-01-14 ENCOUNTER — Other Ambulatory Visit: Payer: Self-pay | Admitting: Infectious Diseases

## 2018-01-14 DIAGNOSIS — E781 Pure hyperglyceridemia: Secondary | ICD-10-CM

## 2018-01-14 DIAGNOSIS — I10 Essential (primary) hypertension: Secondary | ICD-10-CM

## 2018-01-14 DIAGNOSIS — E1159 Type 2 diabetes mellitus with other circulatory complications: Secondary | ICD-10-CM

## 2018-01-14 DIAGNOSIS — N521 Erectile dysfunction due to diseases classified elsewhere: Secondary | ICD-10-CM

## 2018-01-18 ENCOUNTER — Other Ambulatory Visit: Payer: Self-pay | Admitting: Internal Medicine

## 2018-01-18 DIAGNOSIS — I10 Essential (primary) hypertension: Secondary | ICD-10-CM

## 2018-01-24 ENCOUNTER — Other Ambulatory Visit: Payer: Self-pay | Admitting: *Deleted

## 2018-01-24 DIAGNOSIS — I10 Essential (primary) hypertension: Secondary | ICD-10-CM

## 2018-01-24 MED ORDER — AMLODIPINE BESYLATE 10 MG PO TABS: 10.0000 mg | ORAL_TABLET | Freq: Every day | ORAL | 1 refills | Status: DC

## 2018-02-13 ENCOUNTER — Other Ambulatory Visit: Payer: Self-pay | Admitting: Infectious Diseases

## 2018-02-13 DIAGNOSIS — B20 Human immunodeficiency virus [HIV] disease: Secondary | ICD-10-CM

## 2018-03-01 ENCOUNTER — Other Ambulatory Visit: Payer: Self-pay

## 2018-03-01 ENCOUNTER — Ambulatory Visit (INDEPENDENT_AMBULATORY_CARE_PROVIDER_SITE_OTHER): Payer: Medicare HMO | Admitting: Internal Medicine

## 2018-03-01 ENCOUNTER — Encounter (INDEPENDENT_AMBULATORY_CARE_PROVIDER_SITE_OTHER): Payer: Self-pay

## 2018-03-01 VITALS — BP 130/66 | HR 59 | Temp 98.3°F | Ht 64.0 in | Wt 182.2 lb

## 2018-03-01 DIAGNOSIS — M25552 Pain in left hip: Secondary | ICD-10-CM

## 2018-03-01 DIAGNOSIS — I10 Essential (primary) hypertension: Secondary | ICD-10-CM | POA: Diagnosis not present

## 2018-03-01 DIAGNOSIS — E119 Type 2 diabetes mellitus without complications: Secondary | ICD-10-CM | POA: Diagnosis not present

## 2018-03-01 DIAGNOSIS — I2510 Atherosclerotic heart disease of native coronary artery without angina pectoris: Secondary | ICD-10-CM

## 2018-03-01 DIAGNOSIS — Z21 Asymptomatic human immunodeficiency virus [HIV] infection status: Secondary | ICD-10-CM

## 2018-03-01 DIAGNOSIS — Z955 Presence of coronary angioplasty implant and graft: Secondary | ICD-10-CM

## 2018-03-01 DIAGNOSIS — Z23 Encounter for immunization: Secondary | ICD-10-CM

## 2018-03-01 HISTORY — DX: Pain in left hip: M25.552

## 2018-03-01 MED ORDER — NAPROXEN 500 MG PO TABS: 500.0000 mg | ORAL_TABLET | Freq: Two times a day (BID) | ORAL | 0 refills | Status: DC

## 2018-03-01 NOTE — Progress Notes (Signed)
Medicine attending: Medical history, presenting problems, physical findings, and medications, reviewed with resident physician Dr Gorica Svalina on the day of the patient visit and I concur with her evaluation and management plan. 

## 2018-03-01 NOTE — Progress Notes (Signed)
   CC: left hip pain  HPI:  [name] is a 57 y.o. with a PMH of CAD s/p 3 LAD stent (last in 2017), T2DM, HTN, well controlled HIV, presenting to clinic for left hip pain.  Patient states that for the last month or so he has had achy pain in his buttocks after prolonged standing, walking, and mainly running. This is relieved with rest but initially associated with soreness with pressure on the area when sitting. He endorses occasional tingling in his anterior thigh with this. He denies cold extremities, LE edema, chest pain, shortness of breath, rest pain in LEs, foot ulcer or poor wound healing. Patient is very active and usually runs 5k every day, however has not been able to run as much due to the discomfort.  Please see problem based Assessment and Plan for status of patients chronic conditions.  Past Medical History:  Diagnosis Date  . Anemia   . Arthritis    "feet" (11/19/2015)  . CAD (coronary artery disease), native coronary artery    a. remote LAD stent 1990s. b. s/p DESx2 to LAD 11/2015  . Hepatitis C    "going thru the treatments" (11/19/2015)  . High cholesterol   . History of gout    "just recently" (11/19/2015)  . HIV (human immunodeficiency virus infection) ([place])   . Hypertension   . Type II diabetes mellitus ([place])    diagnosed 04-2015    Review of Systems:   Per HPI  Physical Exam:  Vitals:   03/01/18 0916  BP: 130/66  Pulse: (!) 59  Temp: 98.3 F (36.8 C)  TempSrc: Oral  SpO2: 99%  Weight: 182 lb 3.2 oz (82.6 kg)  Height: 5\' 4"  (1.626 m)   GENERAL- alert, co-operative, appears as stated age, not in any distress. CARDIAC- RRR, no murmurs, rubs or gallops. RESP- Moving equal volumes of air, and clear to auscultation bilaterally, no wheezes or crackles. EXTREMITIES- pulse 2+ PT/DP, symmetric, no pedal edema. FROM in bil LEs, strength intact. Neg straight leg raise.  SKIN- Feet warm, w/o ulcers  Assessment & Plan:   See Encounters Tab for  problem based charting.   Patient discussed with Dr. Verdie Drown, MD Internal Medicine PGY-3

## 2018-03-01 NOTE — Assessment & Plan Note (Signed)
Very active patient with 1 mo h/o of left buttocks pain after activity. Exam largely unremarkable and peripheral pulses are strong. Most likely he has MSK cause of his pain, but with h/o significant CAD there is some concern for PAD.  Plan: --treat possible MSK cause with naproxen 500mg  BID prior to activity, and stretching regimen --f/u prn in 2 wks if no improvement at all

## 2018-03-01 NOTE — Patient Instructions (Addendum)
For your hip pain, try taking naproxen 500mg  twice a day; take it 58min to 1hr prior to running or other exercise. Do the below stretches and exercises every day.   If you have no improvement in the next 2 weeks, let us know.   Sciatica Rehab Ask your health care provider which exercises are safe for you. Do exercises exactly as told by your health care provider and adjust them as directed. It is normal to feel mild stretching, pulling, tightness, or discomfort as you do these exercises, but you should stop right away if you feel sudden pain or your pain gets worse.Do not begin these exercises until told by your health care provider. Stretching and range of motion exercises These exercises warm up your muscles and joints and improve the movement and flexibility of your hips and your back. These exercises also help to relieve pain, numbness, and tingling. Exercise A: Sciatic nerve glide 1. Sit in a chair with your head facing down toward your chest. Place your hands behind your back. Let your shoulders slump forward. 2. Slowly straighten one of your knees while you tilt your head back as if you are looking toward the ceiling. Only straighten your leg as far as you can without making your symptoms worse. 3. Hold for __________ seconds. 4. Slowly return to the starting position. 5. Repeat with your other leg. Repeat __________ times. Complete this exercise __________ times a day. Exercise B: Knee to chest with hip adduction and internal rotation  1. Lie on your back on a firm surface with both legs straight. 2. Bend one of your knees and move it up toward your chest until you feel a gentle stretch in your lower back and buttock. Then, move your knee toward the shoulder that is on the opposite side from your leg. ? Hold your leg in this position by holding onto the front of your knee. 3. Hold for __________ seconds. 4. Slowly return to the starting position. 5. Repeat with your other leg. Repeat  __________ times. Complete this exercise __________ times a day. Exercise C: Prone extension on elbows  1. Lie on your abdomen on a firm surface. A bed may be too soft for this exercise. 2. Prop yourself up on your elbows. 3. Use your arms to help lift your chest up until you feel a gentle stretch in your abdomen and your lower back. ? This will place some of your body weight on your elbows. If this is uncomfortable, try stacking pillows under your chest. ? Your hips should stay down, against the surface that you are lying on. Keep your hip and back muscles relaxed. 4. Hold for __________ seconds. 5. Slowly relax your upper body and return to the starting position. Repeat __________ times. Complete this exercise __________ times a day. Strengthening exercises These exercises build strength and endurance in your back. Endurance is the ability to use your muscles for a long time, even after they get tired. Exercise D: Pelvic tilt 1. Lie on your back on a firm surface. Bend your knees and keep your feet flat. 2. Tense your abdominal muscles. Tip your pelvis up toward the ceiling and flatten your lower back into the floor. ? To help with this exercise, you may place a small towel under your lower back and try to push your back into the towel. 3. Hold for __________ seconds. 4. Let your muscles relax completely before you repeat this exercise. Repeat __________ times. Complete this exercise __________ times a  day. Exercise E: Alternating arm and leg raises  1. Get on your hands and knees on a firm surface. If you are on a hard floor, you may want to use padding to cushion your knees, such as an exercise mat. 2. Line up your arms and legs. Your hands should be below your shoulders, and your knees should be below your hips. 3. Lift your left leg behind you. At the same time, raise your right arm and straighten it in front of you. ? Do not lift your leg higher than your hip. ? Do not lift your arm  higher than your shoulder. ? Keep your abdominal and back muscles tight. ? Keep your hips facing the ground. ? Do not arch your back. ? Keep your balance carefully, and do not hold your breath. 4. Hold for __________ seconds. 5. Slowly return to the starting position and repeat with your right leg and your left arm. Repeat __________ times. Complete this exercise __________ times a day. Posture and body mechanics  Body mechanics refers to the movements and positions of your body while you do your daily activities. Posture is part of body mechanics. Good posture and healthy body mechanics can help to relieve stress in your body's tissues and joints. Good posture means that your spine is in its natural S-curve position (your spine is neutral), your shoulders are pulled back slightly, and your head is not tipped forward. The following are general guidelines for applying improved posture and body mechanics to your everyday activities. Standing   When standing, keep your spine neutral and your feet about hip-width apart. Keep a slight bend in your knees. Your ears, shoulders, and hips should line up.  When you do a task in which you stand in one place for a long time, place one foot up on a stable object that is 2-4 inches (5-10 cm) high, such as a footstool. This helps keep your spine neutral. Sitting   When sitting, keep your spine neutral and keep your feet flat on the floor. Use a footrest, if necessary, and keep your thighs parallel to the floor. Avoid rounding your shoulders, and avoid tilting your head forward.  When working at a desk or a computer, keep your desk at a height where your hands are slightly lower than your elbows. Slide your chair under your desk so you are close enough to maintain good posture.  When working at a computer, place your monitor at a height where you are looking straight ahead and you do not have to tilt your head forward or downward to look at the  screen. Resting   When lying down and resting, avoid positions that are most painful for you.  If you have pain with activities such as sitting, bending, stooping, or squatting (flexion-based activities), lie in a position in which your body does not bend very much. For example, avoid curling up on your side with your arms and knees near your chest (fetal position).  If you have pain with activities such as standing for a long time or reaching with your arms (extension-based activities), lie with your spine in a neutral position and bend your knees slightly. Try the following positions: ? Lying on your side with a pillow between your knees. ? Lying on your back with a pillow under your knees. Lifting   When lifting objects, keep your feet at least shoulder-width apart and tighten your abdominal muscles.  Bend your knees and hips and keep your spine neutral.  It is important to lift using the strength of your legs, not your back. Do not lock your knees straight out.  Always ask for help to lift heavy or awkward objects. This information is not intended to replace advice given to you by your health care provider. Make sure you discuss any questions you have with your health care provider. Document Released: 06/05/2005 Document Revised: 02/10/2016 Document Reviewed: 02/19/2015 Elsevier Interactive Patient Education  [employer].

## 2018-03-14 ENCOUNTER — Encounter: Payer: Self-pay | Admitting: Infectious Diseases

## 2018-03-14 ENCOUNTER — Telehealth: Payer: Self-pay | Admitting: Internal Medicine

## 2018-04-14 ENCOUNTER — Other Ambulatory Visit: Payer: Self-pay | Admitting: Internal Medicine

## 2018-04-14 ENCOUNTER — Other Ambulatory Visit: Payer: Self-pay | Admitting: Infectious Diseases

## 2018-04-14 DIAGNOSIS — E781 Pure hyperglyceridemia: Secondary | ICD-10-CM

## 2018-04-14 DIAGNOSIS — E1159 Type 2 diabetes mellitus with other circulatory complications: Secondary | ICD-10-CM

## 2018-04-14 DIAGNOSIS — N521 Erectile dysfunction due to diseases classified elsewhere: Principal | ICD-10-CM

## 2018-04-29 ENCOUNTER — Other Ambulatory Visit: Payer: Self-pay | Admitting: Infectious Diseases

## 2018-04-29 DIAGNOSIS — E781 Pure hyperglyceridemia: Secondary | ICD-10-CM

## 2018-04-30 ENCOUNTER — Ambulatory Visit: Payer: Medicare HMO | Admitting: Dietician

## 2018-04-30 ENCOUNTER — Ambulatory Visit (INDEPENDENT_AMBULATORY_CARE_PROVIDER_SITE_OTHER): Payer: Medicare HMO | Admitting: Internal Medicine

## 2018-04-30 ENCOUNTER — Other Ambulatory Visit: Payer: Self-pay | Admitting: Dietician

## 2018-04-30 ENCOUNTER — Other Ambulatory Visit: Payer: Self-pay

## 2018-04-30 ENCOUNTER — Encounter: Payer: Self-pay | Admitting: Dietician

## 2018-04-30 ENCOUNTER — Encounter: Payer: Self-pay | Admitting: Internal Medicine

## 2018-04-30 VITALS — BP 144/69 | HR 63 | Temp 98.6°F | Wt 188.1 lb

## 2018-04-30 DIAGNOSIS — I10 Essential (primary) hypertension: Secondary | ICD-10-CM

## 2018-04-30 DIAGNOSIS — N521 Erectile dysfunction due to diseases classified elsewhere: Principal | ICD-10-CM

## 2018-04-30 DIAGNOSIS — I2510 Atherosclerotic heart disease of native coronary artery without angina pectoris: Secondary | ICD-10-CM | POA: Diagnosis not present

## 2018-04-30 DIAGNOSIS — M7918 Myalgia, other site: Secondary | ICD-10-CM | POA: Diagnosis not present

## 2018-04-30 DIAGNOSIS — Z79899 Other long term (current) drug therapy: Secondary | ICD-10-CM

## 2018-04-30 DIAGNOSIS — M545 Low back pain: Secondary | ICD-10-CM | POA: Diagnosis not present

## 2018-04-30 DIAGNOSIS — E119 Type 2 diabetes mellitus without complications: Secondary | ICD-10-CM

## 2018-04-30 DIAGNOSIS — Z21 Asymptomatic human immunodeficiency virus [HIV] infection status: Secondary | ICD-10-CM

## 2018-04-30 DIAGNOSIS — Z8619 Personal history of other infectious and parasitic diseases: Secondary | ICD-10-CM

## 2018-04-30 DIAGNOSIS — M25552 Pain in left hip: Secondary | ICD-10-CM

## 2018-04-30 DIAGNOSIS — E1159 Type 2 diabetes mellitus with other circulatory complications: Secondary | ICD-10-CM

## 2018-04-30 MED ORDER — CYCLOBENZAPRINE HCL 10 MG PO TABS: 10.0000 mg | ORAL_TABLET | Freq: Three times a day (TID) | ORAL | 0 refills | Status: AC | PRN

## 2018-04-30 NOTE — Progress Notes (Signed)
   CC: low back and left buttock pain  HPI:   Mr.Adolph H Blahut is a 57 y.o. male with a medical history of CAD, HTN, DMII, HIV (11/2017 RNA undetectable and CD4 count 640), and HCV s/p treatment who presents to the internal medicine clinic today for low back and left buttock pain. Please see problem based charting for the history and status of the patient's current and chronic medical conditions.    Past Medical History:  Diagnosis Date  . Anemia   . Arthritis    "feet" (11/19/2015)  . CAD (coronary artery disease), native coronary artery    a. remote LAD stent 1990s. b. s/p DESx2 to LAD 11/2015  . Hepatitis C    "going thru the treatments" (11/19/2015)  . High cholesterol   . History of gout    "just recently" (11/19/2015)  . HIV (human immunodeficiency virus infection) ([place])   . Hypertension   . Type II diabetes mellitus ([place])    diagnosed 04-2015    Review of Systems:   Pertinent positives mentioned in HPI. Remainder of all ROS negative.   Physical Exam: Vitals:   04/30/18 0835  BP: (!) 144/69  Pulse: 63  Temp: 98.6 F (37 C)  TempSrc: Oral  SpO2: 98%  Weight: 188 lb 1.6 oz (85.3 kg)   Physical Exam  Constitutional: Well-developed, well-nourished, and in no distress.  Eyes: Pupils are equal, round, and reactive to light. EOM are normal.  Back: No deformities. No tenderness to palpation of the low back. Focal tenderness over the left piriformis muscle. Negative straight leg raise bilaterally. Full ROM of the left leg without eliciting pain. Ext: Femoral pulses and pedal pulses intact. No lower extremity edema. Skin: Warm and dry. No rashes or wounds.   Assessment & Plan:   See Encounters Tab for problem based charting.  Patient seen with Dr. Heber Geronimo

## 2018-04-30 NOTE — Patient Instructions (Signed)
It was a pleasure taking care of you today, Mr. Vaile!  Your pain is most likely caused by piriformis syndrome or sciatica.  - Stop taking naproxen since that did not improve your pain. - I have sent in a prescription for Flexeril 10mg  three times a day as needed (this is a muscle relaxer). This medication may cause sedation, so try it first at night to see how you handle it.  - I have made a referral for physical therapy. You should hear from their office about scheduling an appointment. - I have also ordered an xray of your low back. You should hear from radiology about scheduling this image. - I have given you a list of stretches that will target your piriformis muscle. Try to do these everyday.   Please call our clinic if the pain does not improve or if you have any questions at [phone].  Thanks, Dr. Annie Paras

## 2018-04-30 NOTE — Progress Notes (Signed)
Retinal images were done and transmitted today.  

## 2018-05-01 ENCOUNTER — Encounter: Payer: Self-pay | Admitting: Internal Medicine

## 2018-05-01 NOTE — Assessment & Plan Note (Signed)
HPI: [name] reports that his left buttock pain has not improved since starting naproxen after his last visit to clinic on 9/13. The pain continues to occur with exertion, mostly with weight bearing exercises like walking and running (the reclining bicycle and swimming do not trigger the pain). He has been taking naproxen before exercise with no relief. He states that the pain is sharp and starts in his left buttock and radiates up towards his low back. The pain does not radiate down into the leg. He has no pain on the right side. The pain is relieved immediately with rest.   Assessment: [name] has focal pain over the left piriformis muscle. He has no other back or buttock tenderness. Femoral and pedal pulses are strong. His pain is most likely due to piriformis syndrome. DDx also includes sciatica, although the pain does not travel into his leg. Less likely are neurogenic and vascular claudication given that the pain is not relieved by leaning forward and his peripheral pulses are normal. Will treat with a 7-day course of flexeril and stretches targeting the piriformis. Will also obtain xray and refer to PT. If the pain does not improve or becomes worse, claudication syndromes should be revisited and further worked up.  Plan 1. Flexeril 10mg  TID PRN for 7 days 2. Lumbar xray 3. PT referral 4. Piriformis muscle stretches (handout provided)

## 2018-05-03 NOTE — Progress Notes (Signed)
Internal Medicine Clinic Attending  I saw and evaluated the patient.  I personally confirmed the key portions of the history and exam documented by Dr. Dorrell and I reviewed pertinent patient test results.  The assessment, diagnosis, and plan were formulated together and I agree with the documentation in the resident's note. 

## 2018-05-08 ENCOUNTER — Other Ambulatory Visit: Payer: Self-pay | Admitting: Internal Medicine

## 2018-05-08 DIAGNOSIS — E1159 Type 2 diabetes mellitus with other circulatory complications: Secondary | ICD-10-CM

## 2018-05-08 DIAGNOSIS — N521 Erectile dysfunction due to diseases classified elsewhere: Principal | ICD-10-CM

## 2018-05-09 ENCOUNTER — Ambulatory Visit: Payer: Medicare HMO | Attending: Internal Medicine | Admitting: Physical Therapy

## 2018-05-09 DIAGNOSIS — R252 Cramp and spasm: Secondary | ICD-10-CM

## 2018-05-09 DIAGNOSIS — M25652 Stiffness of left hip, not elsewhere classified: Secondary | ICD-10-CM | POA: Diagnosis present

## 2018-05-09 DIAGNOSIS — M25552 Pain in left hip: Secondary | ICD-10-CM | POA: Insufficient documentation

## 2018-05-09 NOTE — Therapy (Addendum)
[facility] [place], [place], [address] Phone: [phone]   Fax:  [phone]  Physical Therapy Evaluation/Discharge Note  Patient Details  Name: John Dickerson MRN: [id number] Date of Birth: 15-Nov-1960 Referring Provider (PT): Dorrell, Neoma Laming MD( resident)  attending. Joni Reining DO   Encounter Date: 05/09/2018  PT End of Session - 05/09/18 1007    Visit Number  1    Number of Visits  13    Date for PT Re-Evaluation  06/20/18    Authorization Type  [employer] Progress note on 10th and KX modifier on 15th    PT Start Time  1015    PT Stop Time  1116    PT Time Calculation (min)  61 min    Activity Tolerance  Patient tolerated treatment well    Behavior During Therapy  WFL for tasks assessed/performed       Past Medical History:  Diagnosis Date  . Anemia   . Arthritis    "feet" (11/19/2015)  . CAD (coronary artery disease), native coronary artery    a. remote LAD stent 1990s. b. s/p DESx2 to LAD 11/2015  . Hepatitis C    "going thru the treatments" (11/19/2015)  . High cholesterol   . History of gout    "just recently" (11/19/2015)  . HIV (human immunodeficiency virus infection) ([place])   . Hypertension   . Type II diabetes mellitus ([place])    diagnosed 04-2015    Past Surgical History:  Procedure Laterality Date  . CARDIAC CATHETERIZATION N/A 11/19/2015   Procedure: Left Heart Cath and Coronary Angiography;  Surgeon: Jolaine Artist, MD;  Location: [facility];  Service: Cardiovascular;  Laterality: N/A;  . CARDIAC CATHETERIZATION N/A 11/19/2015   Procedure: Coronary Stent Intervention;  Surgeon: Jettie Booze, MD;  Location: [facility];  Service: Cardiovascular;  Laterality: N/A;  . CORONARY ANGIOPLASTY WITH STENT PLACEMENT  11/19/2015   "2 stents"    There were no vitals filed for this visit.   Subjective Assessment - 05/09/18 1035    Subjective  I have not been able to  run on a treadmill for 3 months due to left hip pain.   I thought it would go away but it hasn't,, No problem on steps but I have to sit down often while I am cooking    Pertinent History  HIV , hyperlipedemia  See Chart     Limitations  Walking;Standing;House hold activities;Sitting    How long can you sit comfortably?  2 hours    How long can you stand comfortably?  10 -15 min    How long can you walk comfortably?  10-15 minutes    Diagnostic tests  no test recently    Patient Stated Goals  get rif of pain.  Get back to working out    Currently in Pain?  Yes    Pain Score  8     Pain Location  Hip    Pain Orientation  Left    Pain Descriptors / Indicators  Sharp;Shooting    Pain Type  Chronic pain    Pain Onset  More than a month ago    Pain Frequency  Intermittent    Aggravating Factors   running on treadmill, walking and standing too long, i cant cook for long         [facility] PT Assessment - 05/09/18 1011      Assessment   Medical Diagnosis  left hip pain    Referring Provider (PT)  Dorrell, Neoma Laming MD( resident)  attending. Joni Reining DO    Onset Date/Surgical Date  01/16/18   approximately 3 months ago   Hand Dominance  Left    Prior Therapy  none for hip      Precautions   Precautions  None      Restrictions   Weight Bearing Restrictions  No      Balance Screen   Has the patient fallen in the past 6 months  No    How many times?  0    Has the patient had a decrease in activity level because of a fear of falling?   No    Is the patient reluctant to leave their home because of a fear of falling?   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 [employer]      Cognition   Overall Cognitive Status  Within Functional Limits for tasks assessed      Observation/Other Assessments   Focus on Therapeutic Outcomes (FOTO)   intake 61% limitation 39%  predicted 27%      Sensation   Light Touch  Appears Intact    Proprioception  Appears Intact      Functional Tests   Functional tests  Squat      Squat   Comments  able to squat without difficulty or pain      ROM / Strength   AROM / PROM / Strength  AROM;Strength      AROM   Overall AROM   Deficits    Right Hip Flexion  120    Right Hip External Rotation   45    Right Hip Internal Rotation   30   slight tightness   Left Hip Flexion  118    Left Hip External Rotation   34    Left Hip Internal Rotation   20   slight tightness   Lumbar Flexion  70   no pain in left hip   Lumbar Extension  20   no pain in left hip   Lumbar - Right Side Bend  20    Lumbar - Left Side Bend  25    Lumbar - Right Rotation  80% available range    Lumbar - Left Rotation  80% available range      Strength   Overall Strength  Deficits    Right Hip Flexion  5/5    Right Hip Extension  5/5    Right Hip External Rotation   5/5    Right Hip Internal Rotation  5/5    Right Hip ABduction  5/5    Left Hip Flexion  5/5    Left Hip Extension  4/5    Left Hip External Rotation  4/5   pain in hip   Left Hip Internal Rotation  4/5    Left Hip ABduction  4/5    Right Knee  Flexion  5/5    Right Knee Extension  5/5    Left Knee Flexion  5/5    Left Knee Extension  5/5    Right Ankle Dorsiflexion  5/5    Right Ankle Plantar Flexion  5/5    Left Ankle Dorsiflexion  5/5    Left Ankle Plantar Flexion  5/5      Flexibility   Hamstrings  bil hamstring tightness    ITB  left ITB tightness      Palpation   Palpation comment  marked tenderness specifically over piriformis muscle on left side.  No pain from lumbar movements or test                 Objective measurements completed on examination: See above findings.      [facility] Adult PT Treatment/Exercise - 05/09/18 0001      Exercises   Exercises  Knee/Hip      Knee/Hip Exercises: Stretches   Active Hamstring Stretch  3 reps;Left;Right;20 seconds    Active Hamstring Stretch Limitations  standing with towel and DF of ankle with movement    Piriformis Stretch  3 reps;Left;30 seconds    Other Knee/Hip Stretches  Figure 4 stretch 3 x 30 sec   left side     Modalities   Modalities  Moist Heat      Moist Heat Therapy   Number Minutes Moist Heat  15 Minutes    Moist Heat Location  Hip   left     Manual Therapy   Manual Therapy  Soft tissue mobilization    Manual therapy comments  skilled PT used for TPDN    Soft tissue mobilization  left piriformis with ER/IR of left hip       Trigger Point Dry Needling - 05/09/18 1029    Consent Given?  Yes    Education Handout Provided  Yes    Muscles Treated Lower Body  Piriformis    Piriformis Response  Twitch response elicited;Palpable increased muscle length   left side only          PT Education - 05/09/18 1032    Education Details  POC Explanataion of findings.  HEPinitial stretches given. Education on [employer]) Educated  Patient    Methods  Explanation;Demonstration;Tactile cues;Verbal cues;Handout    Comprehension  Verbalized understanding;Returned demonstration       PT Short Term Goals - 05/09/18 1052      PT SHORT TERM GOAL #1   Title  STG=LTG        PT Long Term Goals - 05/09/18 1008      PT LONG TERM GOAL #1   Title  "Pt will be independent with advanced HEP.     Time  6    Period  Weeks    Status  New    Target Date  06/20/18      PT LONG TERM GOAL #2   Title  "Pt will improve L knee hip strength to >/= 4+/5 with </= 2/10 pain to promote safety with walking/standing activities/gym activities    Time  6    Period  Weeks    Status  New    Target Date  06/20/18       PT LONG TERM GOAL #3   Title  "Pt will be able to negotiate steps without exacerbation of pain greater than 1/10     Time  6    Period  Weeks    Status  New    Target Date  06/20/18      PT LONG TERM GOAL #4   Title  "FOTO will improve from 39% limitation     to  27% limitation   indicating improved functional mobility .     Baseline  limitation 39% eval    Time  6    Period  Weeks    Status  New    Target Date  06/20/18      PT LONG TERM GOAL #5   Title  Pt will be able to demonstrate running on treadmill for 10 - 15 minutes without exacerbating pain greater than 2/10    Baseline  Pt unable to run on treadmill at all without pain    Time  6    Period  Weeks    Status  New    Target Date  06/20/18      PT LONG TERM GOAL #6   Title  Pt will be able to perform Figure 4 stretch with symetrical AROM in order to demonstrate decreased tightness of left hip joint See flow chart    Time  6    Period  Weeks    Status  New    Target Date  06/20/18             Plan - 05/09/18 1102    Clinical Impression Statement  Pt presents with low complexity eval with piriformis syndrome for the last 3 months that keeps him from working out in gym on treadmill.  Pt has good strength but inhibited on left ER and hip ext/abd only.  All else is 5/5 . 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 Pt will benefit from skilled PT to address impairments of tight IT band and left hamstring and left piriformis and strength and pain impairments.  Pt consented to TPDN today and was educated on precautians and aftercare.  Pt was closely monitored and reported decreased intensity of pain in left hip post TPDN    Clinical Presentation  Stable    Clinical Presentation due to:  non changing piriformis  pain left hip    Clinical Decision Making  Low    Rehab  Potential  Excellent    PT Frequency  2x / week    PT Duration  6 weeks    PT Treatment/Interventions  Cryotherapy;Electrical Stimulation;Iontophoresis 63m/ml Dexamethasone;Moist Heat;Joint Manipulations;Taping;Dry needling;Passive range of motion;Patient/family education;Manual techniques;Neuromuscular re-education;Therapeutic exercise;Therapeutic activities;Functional mobility training;Stair training    PT Next Visit Plan  [facility] HEP, Assess TPDN and add hip strength as needed    PT Home Exercise Plan  figure 4 and pririformis stretch , hamstring stretch     Consulted and Agree with Plan of Care  Patient       Patient will benefit from skilled therapeutic intervention in order to improve the following deficits and impairments:  Pain, Increased muscle spasms, Decreased mobility, Decreased strength, Decreased range of motion, Impaired flexibility  Visit Diagnosis: Pain in left hip  Stiffness of left hip, not elsewhere classified  Cramp and spasm     Problem List Patient Active Problem List   Diagnosis Date Noted  . Posterior pain of left hip 03/01/2018  . Cataracts, bilateral 05/21/2017  . Hepatitis A immune 11/20/2016  . Hepatitis B immune 11/20/2016  . S/P angioplasty with  stent, 11/19/15 DES X 2 mLAD and dLAD, normal EF  11/20/2015  . Coronary artery disease involving native coronary artery of native heart   . Exertional angina ([facility]) 11/17/2015  . Gout 10/29/2015  . Anemia due to medication 09/24/2015  . Type 2 diabetes mellitus with circulatory disorder causing erectile dysfunction ([place]) 05/11/2015  . Preventative health care 05/11/2015  . Hyperlipemia 05/11/2015  . Arthritis of shoulder region, left 03/24/2013  . Hypertriglyceridemia 03/24/2013  . ERECTILE DYSFUNCTION 09/12/2006  . Human immunodeficiency virus (HIV) disease ([place]) 08/09/2006  . Acute hepatitis C virus infection 08/09/2006  . Essential hypertension 08/09/2006    Voncille Lo, PT Certified Exercise  Expert for the Aging Adult  05/09/18 12:26 PM Phone: [phone] Fax: [facility] [address] [place], [place], [address] Phone: [phone]   Fax:  [phone]  Name: John Dickerson MRN: [id number] Date of Birth: 26-Nov-1960   PHYSICAL THERAPY DISCHARGE SUMMARY  Visits from Start of Care: 1  Current functional level related to goals / functional outcomes: unknown   Remaining deficits: unknown   Education / Equipment: Initial Eval/ initial HEP Plan:                                                    Patient goals were not met. Patient is being discharged due to not returning since the last visit.  ?????    Pt was called and PT was not able to leave message on answering machine.  Pt has not contacted clinic or returned for appt. Will be DC  Voncille Lo, PT Certified Exercise Expert for the Aging Adult  06/18/18 7:46 AM Phone: [phone] Fax: [phone]

## 2018-05-09 NOTE — Patient Instructions (Signed)
   Stretching: Piriformis (Supine)  Pull right knee toward opposite shoulder. Hold ____ seconds. Relax. Repeat ____ times per set. Do ____ sets per session. Do ____ sessions per day.  Leg Extension (Hamstring)   Hamstring Stretch   With other leg bent, foot flat, grasp right leg and slowly try to straighten knee. Hold 30____ seconds. Repeat __3__ times. Do __2-3__ sessions per day.  [email or web address].us/279   Hamstring Stretch (Standing)   Standing, place one heel on chair or bench. Use one or both hands on thigh for support. Keeping torso straight, lean forward slowly until a stretch is felt in back of same thigh. Hold ____ seconds. Repeat with other leg. Use a towel and flex foot up for neural glide as show clinic       Trigger Point Dry Needling  . What is Trigger Point Dry Needling (DN)? o DN is a physical therapy technique used to treat muscle pain and dysfunction. Specifically, DN helps deactivate muscle trigger points (muscle knots).  o A thin filiform needle is used to penetrate the skin and stimulate the underlying trigger point. The goal is for a local twitch response (LTR) to occur and for the trigger point to relax. No medication of any kind is injected during the procedure.   . What Does Trigger Point Dry Needling Feel Like?  o The procedure feels different for each individual patient. Some patients report that they do not actually feel the needle enter the skin and overall the process is not painful. Very mild bleeding may occur. However, many patients feel a deep cramping in the muscle in which the needle was inserted. This is the local twitch response.   [name] How Will I feel after the treatment? o Soreness is normal, and the onset of soreness may not occur for a few hours. Typically this soreness does not last longer than two days.  o Bruising is uncommon, however; ice can be used to decrease any possible bruising.  o In rare cases feeling tired or nauseous after  the treatment is normal. In addition, your symptoms may get worse before they get better, this period will typically not last longer than 24 hours.   . What Can I do After My Treatment? o Increase your hydration by drinking more water for the next 24 hours. o You may place ice or heat on the areas treated that have become sore, however, do not use heat on inflamed or bruised areas. Heat often brings more relief post needling. o You can continue your regular activities, but vigorous activity is not recommended initially after the treatment for 24 hours. o DN is best combined with other physical therapy such as strengthening, stretching, and other therapies.   Voncille Lo, PT Certified Exercise Expert for the Aging Adult  05/09/18 10:32 AM Phone: [phone] Fax: [phone]

## 2018-06-13 ENCOUNTER — Other Ambulatory Visit: Payer: Self-pay | Admitting: Interventional Cardiology

## 2018-06-21 ENCOUNTER — Other Ambulatory Visit: Payer: Self-pay | Admitting: Internal Medicine

## 2018-06-24 ENCOUNTER — Other Ambulatory Visit: Payer: Self-pay | Admitting: Internal Medicine

## 2018-06-24 MED ORDER — ALLOPURINOL 100 MG PO TABS: 150.0000 mg | ORAL_TABLET | Freq: Every day | ORAL | 6 refills | Status: DC

## 2018-06-24 NOTE — Telephone Encounter (Signed)
Needs refill on allopurinol (ZYLOPRIM) 100 MG tablet on [facility] [place] [facility] [place] AT [address];pt contact [phone]  Pt said Dr contact on Friday and told him they was there, pt went to the pharmacy and pharmacy don't have anything  Pls contact pt, he been without medicine for  3days

## 2018-06-24 NOTE — Telephone Encounter (Signed)
Sent.  Needs appointment with new PCP if not already scheduled.  Thank you

## 2018-06-24 NOTE — Telephone Encounter (Signed)
Please schedule an appt w/PCP per Dr Daryll Drown. Thanks

## 2018-06-25 ENCOUNTER — Telehealth: Payer: Self-pay | Admitting: *Deleted

## 2018-06-25 NOTE — Telephone Encounter (Signed)
Pt called stated his script needed to be filled, ask what was going on? Informed pt that script was sent to pharm 1/6, he stated he would call, triage called pharm, med is ready for pick up and has been since 1/6

## 2018-07-13 ENCOUNTER — Other Ambulatory Visit: Payer: Self-pay | Admitting: Internal Medicine

## 2018-07-13 DIAGNOSIS — N521 Erectile dysfunction due to diseases classified elsewhere: Principal | ICD-10-CM

## 2018-07-13 DIAGNOSIS — E1159 Type 2 diabetes mellitus with other circulatory complications: Secondary | ICD-10-CM

## 2018-07-29 ENCOUNTER — Other Ambulatory Visit: Payer: Self-pay

## 2018-07-29 DIAGNOSIS — I10 Essential (primary) hypertension: Secondary | ICD-10-CM

## 2018-07-29 MED ORDER — METOPROLOL SUCCINATE ER 100 MG PO TB24: ORAL_TABLET | ORAL | 0 refills | Status: DC

## 2018-07-29 NOTE — Telephone Encounter (Signed)
metoprolol succinate (TOPROL-XL) 100 MG 24 hr tablet, refill request @  [id number] - [place], [address] [phone] (Phone) [phone] (Fax)

## 2018-07-31 ENCOUNTER — Telehealth: Payer: Self-pay | Admitting: *Deleted

## 2018-07-31 NOTE — Telephone Encounter (Signed)
Pt states he was told by the pharm that clinic had not sent a refill for metoprolol to them, call pharm, it is ready but they states they cannot reach him to verify before delivery. Informed pt. Pharm will send out today

## 2018-08-04 ENCOUNTER — Other Ambulatory Visit: Payer: Self-pay | Admitting: Infectious Diseases

## 2018-08-04 DIAGNOSIS — B20 Human immunodeficiency virus [HIV] disease: Secondary | ICD-10-CM

## 2018-08-07 ENCOUNTER — Other Ambulatory Visit: Payer: Self-pay | Admitting: *Deleted

## 2018-08-07 DIAGNOSIS — Z79899 Other long term (current) drug therapy: Secondary | ICD-10-CM

## 2018-08-07 DIAGNOSIS — B20 Human immunodeficiency virus [HIV] disease: Secondary | ICD-10-CM

## 2018-08-07 DIAGNOSIS — Z113 Encounter for screening for infections with a predominantly sexual mode of transmission: Secondary | ICD-10-CM

## 2018-08-12 ENCOUNTER — Other Ambulatory Visit: Payer: Medicare HMO

## 2018-08-12 ENCOUNTER — Other Ambulatory Visit (HOSPITAL_COMMUNITY)
Admission: RE | Admit: 2018-08-12 | Discharge: 2018-08-12 | Disposition: A | Discharge status: Home / Self Care | Payer: Medicare HMO | Source: Ambulatory Visit | Attending: Infectious Diseases | Admitting: Infectious Diseases

## 2018-08-12 DIAGNOSIS — Z113 Encounter for screening for infections with a predominantly sexual mode of transmission: Secondary | ICD-10-CM

## 2018-08-12 DIAGNOSIS — B20 Human immunodeficiency virus [HIV] disease: Secondary | ICD-10-CM | POA: Insufficient documentation

## 2018-08-12 DIAGNOSIS — Z79899 Other long term (current) drug therapy: Secondary | ICD-10-CM

## 2018-08-13 LAB — URINE CYTOLOGY ANCILLARY ONLY
Chlamydia: NEGATIVE
Neisseria Gonorrhea: NEGATIVE

## 2018-08-13 LAB — T-HELPER CELL (CD4) - (RCID CLINIC ONLY)
CD4 % Helper T Cell: 24 % — ABNORMAL LOW (ref 33–55)
CD4 T Cell Abs: 600 /uL (ref 400–2700)

## 2018-08-14 LAB — LIPID PANEL
Cholesterol: 145 mg/dL (ref <200)
HDL: 39 mg/dL — ABNORMAL LOW (ref >=40)
LDL Cholesterol (Calc): 89 mg/dL (calc)
Non-HDL Cholesterol (Calc): 106 mg/dL (calc) (ref <130)
Total CHOL/HDL Ratio: 3.7 (calc) (ref <5.0)
Triglycerides: 81 mg/dL (ref <150)

## 2018-08-14 LAB — RPR: RPR Ser Ql: NONREACTIVE

## 2018-08-14 LAB — CBC WITH DIFFERENTIAL/PLATELET
Absolute Monocytes: 619 cells/uL (ref 200–950)
Basophils Absolute: 43 cells/uL (ref 0–200)
Basophils Relative: 0.6 %
Eosinophils Absolute: 151 cells/uL (ref 15–500)
Eosinophils Relative: 2.1 %
HCT: 39.3 % (ref 38.5–50.0)
Hemoglobin: 13.8 g/dL (ref 13.2–17.1)
Lymphs Abs: 2419 cells/uL (ref 850–3900)
MCH: 31.8 pg (ref 27.0–33.0)
MCHC: 35.1 g/dL (ref 32.0–36.0)
MCV: 90.6 fL (ref 80.0–100.0)
MPV: 9.6 fL (ref 7.5–12.5)
Monocytes Relative: 8.6 %
NEUTROS ABS: 3967 {cells}/uL (ref 1500–7800)
Neutrophils Relative %: 55.1 %
Platelets: 356 10*3/uL (ref 140–400)
RBC: 4.34 10*6/uL (ref 4.20–5.80)
RDW: 12.8 % (ref 11.0–15.0)
Total Lymphocyte: 33.6 %
WBC: 7.2 10*3/uL (ref 3.8–10.8)

## 2018-08-14 LAB — COMPLETE METABOLIC PANEL WITH GFR
AG Ratio: 1.5 (calc) (ref 1.0–2.5)
ALBUMIN MSPROF: 4.5 g/dL (ref 3.6–5.1)
ALT: 23 U/L (ref 9–46)
AST: 21 U/L (ref 10–35)
Alkaline phosphatase (APISO): 64 U/L (ref 35–144)
BUN / CREAT RATIO: 20 (calc) (ref 6–22)
BUN: 27 mg/dL — ABNORMAL HIGH (ref 7–25)
CALCIUM: 10.1 mg/dL (ref 8.6–10.3)
CO2: 25 mmol/L (ref 20–32)
Chloride: 105 mmol/L (ref 98–110)
Creat: 1.34 mg/dL — ABNORMAL HIGH (ref 0.70–1.33)
GFR, Est African American: 67 mL/min/{1.73_m2} (ref >=60)
GFR, Est Non African American: 58 mL/min/{1.73_m2} — ABNORMAL LOW (ref >=60)
Globulin: 3 g/dL (calc) (ref 1.9–3.7)
Glucose, Bld: 119 mg/dL — ABNORMAL HIGH (ref 65–99)
Potassium: 5 mmol/L (ref 3.5–5.3)
Sodium: 138 mmol/L (ref 135–146)
TOTAL PROTEIN: 7.5 g/dL (ref 6.1–8.1)
Total Bilirubin: 0.4 mg/dL (ref 0.2–1.2)

## 2018-08-14 LAB — HIV-1 RNA QUANT-NO REFLEX-BLD
HIV 1 RNA Quant: <20 copies/mL
HIV-1 RNA Quant, Log: <1.3 Log copies/mL

## 2018-08-21 ENCOUNTER — Ambulatory Visit (INDEPENDENT_AMBULATORY_CARE_PROVIDER_SITE_OTHER): Payer: Medicare HMO | Admitting: Infectious Diseases

## 2018-08-21 ENCOUNTER — Encounter: Payer: Self-pay | Admitting: Gastroenterology

## 2018-08-21 ENCOUNTER — Encounter: Payer: Self-pay | Admitting: Infectious Diseases

## 2018-08-21 VITALS — BP 157/100 | HR 67 | Temp 98.4°F | Wt 188.0 lb

## 2018-08-21 DIAGNOSIS — Z79899 Other long term (current) drug therapy: Secondary | ICD-10-CM

## 2018-08-21 DIAGNOSIS — H269 Unspecified cataract: Secondary | ICD-10-CM

## 2018-08-21 DIAGNOSIS — N521 Erectile dysfunction due to diseases classified elsewhere: Secondary | ICD-10-CM

## 2018-08-21 DIAGNOSIS — B1710 Acute hepatitis C without hepatic coma: Secondary | ICD-10-CM | POA: Diagnosis not present

## 2018-08-21 DIAGNOSIS — I2510 Atherosclerotic heart disease of native coronary artery without angina pectoris: Secondary | ICD-10-CM

## 2018-08-21 DIAGNOSIS — Z113 Encounter for screening for infections with a predominantly sexual mode of transmission: Secondary | ICD-10-CM

## 2018-08-21 DIAGNOSIS — E1159 Type 2 diabetes mellitus with other circulatory complications: Secondary | ICD-10-CM

## 2018-08-21 DIAGNOSIS — B20 Human immunodeficiency virus [HIV] disease: Secondary | ICD-10-CM

## 2018-08-21 DIAGNOSIS — B182 Chronic viral hepatitis C: Secondary | ICD-10-CM | POA: Insufficient documentation

## 2018-08-21 NOTE — Assessment & Plan Note (Signed)
Was prev F2/3 Consider repeat u/s at next visit- last was 2018.

## 2018-08-21 NOTE — Progress Notes (Signed)
   Subjective:    Patient ID: John Dickerson, male    DOB: 09/04/1960, 58 y.o.   MRN: [id number]  HPI 59yo M with hx of HIV+ was Dx 1998, DM2,CAD (remote LAD stent 1990s, s/p DESx2 to LAD 11/2015), and HTN. PreviousHep C 1a. 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  He was on atripla til changed to [place] for Hep C rx.  He was started on zepatier/sovaldi/rib (plan 16 weeks) on 2-25-18after he took harvoni for short course. His ART was changed to Isentress/Descoveyat that time. He was changed to biktarvy 11-2016. Repeat Hep C RNAnegative(7-17 and 3-18).  He has had f/u at [facility] for his DM.  Is worried abuot his diet, BP and wt gain.  Hasn't been checking his FSG. Has been taking DM rx. Interested in implantable DM monitor.    HIV 1 RNA Quant (copies/mL)  Date Value  08/12/2018 <20 NOT DETECTED  11/21/2017 <20 NOT DETECTED  05/15/2017 <20 DETECTED (A)   CD4 T Cell Abs (/uL)  Date Value  08/12/2018 600  11/21/2017 640  05/15/2017 540    Review of Systems  Constitutional: Negative for appetite change and unexpected weight change.  Eyes: Positive for visual disturbance.  Respiratory: Negative for shortness of breath.   Cardiovascular: Negative for chest pain.  Gastrointestinal: Negative for constipation and diarrhea.  Genitourinary: Negative for difficulty urinating.  Neurological: Negative for numbness.  Psychiatric/Behavioral: Negative for sleep disturbance.  Please see HPI. All other systems reviewed and negative. Has not had colon. Has not had ophtho.     Objective:   Physical Exam Constitutional:      Appearance: Normal appearance. He is obese.  HENT:     Mouth/Throat:     Mouth: Mucous membranes are moist.     Pharynx: No oropharyngeal exudate.  Eyes:     Extraocular Movements: Extraocular movements intact.     Pupils: Pupils are equal, round, and reactive to light.  Neck:     Musculoskeletal: Normal range of motion.  Cardiovascular:     Rate and Rhythm: Normal rate  and regular rhythm.  Pulmonary:     Effort: Pulmonary effort is normal.     Breath sounds: Normal breath sounds.  Abdominal:     General: Bowel sounds are normal. There is no distension.     Palpations: Abdomen is soft.     Tenderness: There is no abdominal tenderness.  Musculoskeletal: Normal range of motion.        General: No swelling.     Right lower leg: No edema.     Left lower leg: No edema.  Neurological:     General: No focal deficit present.     Mental Status: He is alert.     Sensory: No sensory deficit.  Psychiatric:        Mood and Affect: Mood normal.       Assessment & Plan:

## 2018-08-21 NOTE — Assessment & Plan Note (Signed)
He is doing well Continues on his current rx. biktarvy vax are up to date.  Offered/refused condoms.  rtc in 9 months.  Will get him in for colon He will make appt for eye MD.

## 2018-08-21 NOTE — Assessment & Plan Note (Signed)
He is doing well.  A1C was 7.1% 1 year ago.  Will fet him back with [facility], he is unsure who his pcp is.

## 2018-08-21 NOTE — Assessment & Plan Note (Signed)
Has been tx, will mark as resolved.

## 2018-08-21 NOTE — Assessment & Plan Note (Signed)
Will get him back in with ophtho

## 2018-08-26 ENCOUNTER — Encounter: Payer: Medicare HMO | Admitting: Infectious Diseases

## 2018-08-30 ENCOUNTER — Ambulatory Visit: Payer: Medicare HMO | Admitting: Infectious Diseases

## 2018-09-05 ENCOUNTER — Other Ambulatory Visit: Payer: Self-pay | Admitting: Interventional Cardiology

## 2018-09-09 ENCOUNTER — Telehealth: Payer: Self-pay | Admitting: *Deleted

## 2018-09-09 ENCOUNTER — Other Ambulatory Visit: Payer: Self-pay

## 2018-09-09 MED ORDER — LOSARTAN POTASSIUM 100 MG PO TABS: ORAL_TABLET | ORAL | 1 refills | Status: DC

## 2018-09-09 NOTE — Telephone Encounter (Signed)
SPOKE WITH OFFICE / DUE TO VIRUS- PATIENT IS TO CALL AFTER April 9TH TO MAKE NEW APPOINTMENT. PATIENT WAS CONTACTED AND IS AWARE OF THIS APPOINTMENT.

## 2018-09-09 NOTE — Progress Notes (Signed)
Called patient's pharmacy and gave approval for 90 day supply of his losartan with 1 refill.

## 2018-09-18 ENCOUNTER — Ambulatory Visit: Payer: Medicare HMO | Admitting: Gastroenterology

## 2018-10-17 ENCOUNTER — Other Ambulatory Visit: Payer: Self-pay | Admitting: Internal Medicine

## 2018-10-17 DIAGNOSIS — I10 Essential (primary) hypertension: Secondary | ICD-10-CM

## 2018-10-17 DIAGNOSIS — N521 Erectile dysfunction due to diseases classified elsewhere: Secondary | ICD-10-CM

## 2018-10-17 DIAGNOSIS — E1159 Type 2 diabetes mellitus with other circulatory complications: Secondary | ICD-10-CM

## 2018-10-29 ENCOUNTER — Other Ambulatory Visit: Payer: Self-pay

## 2018-10-29 ENCOUNTER — Encounter: Payer: Self-pay | Admitting: Internal Medicine

## 2018-10-29 ENCOUNTER — Ambulatory Visit (INDEPENDENT_AMBULATORY_CARE_PROVIDER_SITE_OTHER): Payer: Medicare HMO | Admitting: Internal Medicine

## 2018-10-29 DIAGNOSIS — I10 Essential (primary) hypertension: Secondary | ICD-10-CM

## 2018-10-29 DIAGNOSIS — Z21 Asymptomatic human immunodeficiency virus [HIV] infection status: Secondary | ICD-10-CM

## 2018-10-29 DIAGNOSIS — E1159 Type 2 diabetes mellitus with other circulatory complications: Secondary | ICD-10-CM | POA: Diagnosis not present

## 2018-10-29 DIAGNOSIS — N521 Erectile dysfunction due to diseases classified elsewhere: Secondary | ICD-10-CM | POA: Diagnosis not present

## 2018-10-29 DIAGNOSIS — R21 Rash and other nonspecific skin eruption: Secondary | ICD-10-CM | POA: Insufficient documentation

## 2018-10-29 DIAGNOSIS — B1920 Unspecified viral hepatitis C without hepatic coma: Secondary | ICD-10-CM

## 2018-10-29 DIAGNOSIS — Z79899 Other long term (current) drug therapy: Secondary | ICD-10-CM

## 2018-10-29 DIAGNOSIS — Z7984 Long term (current) use of oral hypoglycemic drugs: Secondary | ICD-10-CM

## 2018-10-29 DIAGNOSIS — Z9111 Patient's noncompliance with dietary regimen: Secondary | ICD-10-CM

## 2018-10-29 NOTE — Progress Notes (Signed)
[facility] Internal Medicine Residency Telephone Encounter Continuity Care Appointment  HPI:   This telephone encounter was created for [name] on 10/29/2018 for the following purpose/cc continuity clinic to follow-up on hypertension, type 2 diabetes mellitus.  Mr. Pesta is a 58 year old pleasant African-American gentleman with medical history listed below home I called to follow-up on chronic medical problems.  Please see problem based charting for further details    Past Medical History:       Past Medical History:  Diagnosis Date  . Anemia   . Anemia due to medication 09/24/2015  . Arthritis    "feet" (11/19/2015)  . CAD (coronary artery disease), native coronary artery    a. remote LAD stent 1990s. b. s/p DESx2 to LAD 11/2015  . Hepatitis C    "going thru the treatments" (11/19/2015)  . High cholesterol   . History of gout    "just recently" (11/19/2015)  . HIV (human immunodeficiency virus infection) ([place])   . Hypertension   . Posterior pain of left hip 03/01/2018  . Type II diabetes mellitus ([place])    diagnosed 04-2015      ROS:      Assessment / Plan / Recommendations:   Please see A&P under problem oriented charting for assessment of the patient's acute and chronic medical conditions.   As always, pt is advised that if symptoms worsen or new symptoms arise, they should go to an urgent care facility or to to ER for further evaluation.   Consent and Medical Decision Making:   Patient discussed with Dr. Daryll Drown  This is a telephone encounter between John Dickerson and Jean Rosenthal on 10/29/2018 for following up hypertension, diabetes and rash. The visit was conducted with the patient located at home and Jean Rosenthal at [facility]. The patient's identity was confirmed using their DOB and current address. The patient has consented to being evaluated through a telephone encounter and understands the associated risks (an examination  cannot be done and the patient may need to come in for an appointment) / benefits (allows the patient to remain at home, decreasing exposure to coronavirus). I personally spent 8 minutes on medical discussion.         Assessment & Plan Note by Jean Rosenthal, MD at 10/29/2018 12:45 PM  Author: Jean Rosenthal, MD Author Type: Resident Filed: 10/29/2018 12:46 PM  Note Status: Written Cosign: Cosign Not Required Encounter Date: 11/01/2018  Problem: Type 2 diabetes mellitus with circulatory disorder causing erectile dysfunction ([place])  Editor: Jean Rosenthal, MD (Resident)    This is a chronic recurrent problem.  He is only on metformin 1000 mg twice daily and does not monitor home blood glucose.  His last A1c was 7.1% in March 2019.  Plan: -Continue metformin 1000 mg twice daily - Place future order for hemoglobin A1c.    Assessment & Plan Note by Jean Rosenthal, MD at 10/29/2018 12:44 PM  Author: Jean Rosenthal, MD Author Type: Resident Filed: 10/29/2018 12:44 PM  Note Status: Written Cosign: Cosign Not Required Encounter Date: 11/01/2018  Problem: Essential hypertension  Editor: Jean Rosenthal, MD (Resident)    This issue is chronic and he is on multiple blood pressure medications.  He last visited Dr. Johnnye Sima on August 29, 2018 to follow-up on HIV and hepatitis C and was noted to have an elevated blood pressure of 150s/100s.  He endorses dietary indiscretion stating that he would typically eat fast food ([employer], [employer])  and his blood pressure fluctuates based on his salt intake.  On chart review, his BP readings has ranged 120s-140s/60s-70s.     BP Readings from Last 3 Encounters:  08/21/18 (!) 157/100  04/30/18 (!) 144/69  03/01/18 130/66   Plan: -Requires in person BP evaluation -Continue Aldactone 25 mg daily (room to increase to 50 mg based on BP reading and creatinine). -Continue amlodipine 10 mg daily -Continue losartan 100 mg daily -Continue Toprol-XL 100 mg daily -Will  place order for future BMP      Assessment & Plan Note by Jean Rosenthal, MD at 10/29/2018 12:38 PM  Author: Jean Rosenthal, MD Author Type: Resident Filed: 10/29/2018 12:40 PM  Note Status: Written Cosign: Cosign Not Required Encounter Date: 11/01/2018  Problem: Rash  Editor: Jean Rosenthal, MD (Resident)    He states that he is doing well with the exception of a 1 month history of rash located on the left shoulder, left calf and posterior right thigh.  The rash is pruritic however has not worsened reporting that " there are only 3 spots."He denies recent changes in medications, fevers, chills, night sweats.  He previously tried topical hydrocortisone with no significant relief.   Differential diagnosis includes papule vs pustule vs contact dermatitis vs other skin immune mediated process.  Given the distribution of rash, it does not appear to be herpetic in nature though he has a history of HIV which puts him at risk..  Plan: -Advised to make in person follow-up to have rash evaluated

## 2018-10-29 NOTE — Progress Notes (Signed)
  [facility] Internal Medicine Residency Telephone Encounter Continuity Care Appointment  HPI:   This telephone encounter was created for [name] on 10/29/2018 for the following purpose/cc continuity clinic to follow-up on hypertension, type 2 diabetes mellitus.  John Dickerson is a 58 year old pleasant African-American gentleman with medical history listed below home I called to follow-up on chronic medical problems.  Please see problem based charting for further details    Past Medical History:  Past Medical History:  Diagnosis Date  . Anemia   . Anemia due to medication 09/24/2015  . Arthritis    "feet" (11/19/2015)  . CAD (coronary artery disease), native coronary artery    a. remote LAD stent 1990s. b. s/p DESx2 to LAD 11/2015  . Hepatitis C    "going thru the treatments" (11/19/2015)  . High cholesterol   . History of gout    "just recently" (11/19/2015)  . HIV (human immunodeficiency virus infection) ([place])   . Hypertension   . Posterior pain of left hip 03/01/2018  . Type II diabetes mellitus ([place])    diagnosed 04-2015      ROS:      Assessment / Plan / Recommendations:   Please see A&P under problem oriented charting for assessment of the patient's acute and chronic medical conditions.   As always, pt is advised that if symptoms worsen or new symptoms arise, they should go to an urgent care facility or to to ER for further evaluation.   Consent and Medical Decision Making:   Patient discussed with Dr. Daryll Drown  This is a telephone encounter between John Dickerson and Jean Rosenthal on 10/29/2018 for following up hypertension, diabetes and rash. The visit was conducted with the patient located at home and Jean Rosenthal at [facility]. The patient's identity was confirmed using their DOB and current address. The patient has consented to being evaluated through a telephone encounter and understands the associated risks (an examination cannot be done and the patient  may need to come in for an appointment) / benefits (allows the patient to remain at home, decreasing exposure to coronavirus). I personally spent 8 minutes on medical discussion.

## 2018-10-29 NOTE — Assessment & Plan Note (Signed)
This is a chronic recurrent problem.  He is only on metformin 1000 mg twice daily and does not monitor home blood glucose.  His last A1c was 7.1% in March 2019.  Plan: -Continue metformin 1000 mg twice daily - Place future order for hemoglobin A1c.

## 2018-10-29 NOTE — Assessment & Plan Note (Signed)
He states that he is doing well with the exception of a 1 month history of rash located on the left shoulder, left calf and posterior right thigh.  The rash is pruritic however has not worsened reporting that " there are only 3 spots."He denies recent changes in medications, fevers, chills, night sweats.  He previously tried topical hydrocortisone with no significant relief.   Differential diagnosis includes papule vs pustule vs contact dermatitis vs other skin immune mediated process.  Given the distribution of rash, it does not appear to be herpetic in nature though he has a history of HIV which puts him at risk..  Plan: -Advised to make in person follow-up to have rash evaluated

## 2018-10-29 NOTE — Assessment & Plan Note (Signed)
This issue is chronic and he is on multiple blood pressure medications.  He last visited Dr. Johnnye Sima on August 29, 2018 to follow-up on HIV and hepatitis C and was noted to have an elevated blood pressure of 150s/100s.  He endorses dietary indiscretion stating that he would typically eat fast food ([employer], [employer]) and his blood pressure fluctuates based on his salt intake.  On chart review, his BP readings has ranged 120s-140s/60s-70s.  BP Readings from Last 3 Encounters:  08/21/18 (!) 157/100  04/30/18 (!) 144/69  03/01/18 130/66   Plan: -Requires in person BP evaluation -Continue Aldactone 25 mg daily (room to increase to 50 mg based on BP reading and creatinine). -Continue amlodipine 10 mg daily -Continue losartan 100 mg daily -Continue Toprol-XL 100 mg daily -Will place order for future BMP

## 2018-10-31 ENCOUNTER — Ambulatory Visit: Payer: Medicare HMO | Admitting: Gastroenterology

## 2018-11-01 ENCOUNTER — Encounter: Payer: Medicare HMO | Admitting: Internal Medicine

## 2018-11-01 ENCOUNTER — Ambulatory Visit: Payer: Medicare HMO | Admitting: Internal Medicine

## 2018-11-04 ENCOUNTER — Telehealth: Payer: Self-pay | Admitting: Internal Medicine

## 2018-11-04 NOTE — Telephone Encounter (Signed)
John Dickerson, can pt come to clinic for these future labs on his chart?

## 2018-11-04 NOTE — Telephone Encounter (Signed)
Pt is wanting to know if he have any order for blood work [phone]

## 2018-11-06 NOTE — Progress Notes (Signed)
Internal Medicine Clinic Attending  Case discussed with Dr. Agyei soon after the resident saw the patient.  We reviewed the resident's history, telephone conversation and pertinent patient test results.  I agree with the assessment, diagnosis, and plan of care documented in the resident's note.   

## 2018-11-13 ENCOUNTER — Telehealth: Payer: Self-pay | Admitting: *Deleted

## 2018-11-13 ENCOUNTER — Telehealth: Payer: Self-pay

## 2018-11-13 DIAGNOSIS — N521 Erectile dysfunction due to diseases classified elsewhere: Secondary | ICD-10-CM

## 2018-11-13 DIAGNOSIS — E1159 Type 2 diabetes mellitus with other circulatory complications: Secondary | ICD-10-CM

## 2018-11-13 NOTE — Telephone Encounter (Signed)
UNABLE TO CONTACT PATIENT BY PHONE. BOTH NUMBERS UNABLE TO LEAVE MESSAGES.   PATIENT WILL NEED TO CONTACT EYE DR. OFFICE BEFORE GETTING APPOINTMENT.

## 2018-11-18 NOTE — Telephone Encounter (Signed)
Reached out to patient regarding getting blood work done at [facility]. Patient refused and prefers to have tests done at clinic if possible. Will try to work on scheduling patient for blood work in the clinic.

## 2018-11-19 NOTE — Telephone Encounter (Signed)
Reached out to patient to schedule lab visit - 11/20/2018 at 9:30am for A1c and microalbumin.

## 2018-11-20 ENCOUNTER — Other Ambulatory Visit (INDEPENDENT_AMBULATORY_CARE_PROVIDER_SITE_OTHER): Payer: Medicare HMO

## 2018-11-20 ENCOUNTER — Other Ambulatory Visit: Payer: Self-pay

## 2018-11-20 DIAGNOSIS — E1159 Type 2 diabetes mellitus with other circulatory complications: Secondary | ICD-10-CM

## 2018-11-20 DIAGNOSIS — N521 Erectile dysfunction due to diseases classified elsewhere: Secondary | ICD-10-CM | POA: Diagnosis not present

## 2018-11-21 LAB — BMP8+ANION GAP
Anion Gap: 16 mmol/L (ref 10.0–18.0)
BUN/Creatinine Ratio: 21 (ref 10–24)
BUN: 28 mg/dL — ABNORMAL HIGH (ref 8–27)
CO2: 20 mmol/L (ref 20–29)
Calcium: 10.3 mg/dL — ABNORMAL HIGH (ref 8.6–10.2)
Chloride: 103 mmol/L (ref 96–106)
Creatinine, Ser: 1.36 mg/dL — ABNORMAL HIGH (ref 0.76–1.27)
GFR calc Af Amer: 65 mL/min/{1.73_m2} (ref >59)
GFR calc non Af Amer: 56 mL/min/{1.73_m2} — ABNORMAL LOW (ref >59)
Glucose: 179 mg/dL — ABNORMAL HIGH (ref 65–99)
Potassium: 5.1 mmol/L (ref 3.5–5.2)
Sodium: 139 mmol/L (ref 134–144)

## 2018-11-21 LAB — MICROALBUMIN / CREATININE URINE RATIO
Creatinine, Urine: 142.1 mg/dL
Microalb/Creat Ratio: 16 mg/g creat (ref 0–29)
Microalbumin, Urine: 23 ug/mL

## 2018-11-21 LAB — HEMOGLOBIN A1C
Est. average glucose Bld gHb Est-mCnc: 131 mg/dL
Hgb A1c MFr Bld: 6.2 % — ABNORMAL HIGH (ref 4.8–5.6)

## 2018-11-28 ENCOUNTER — Other Ambulatory Visit: Payer: Self-pay | Admitting: Internal Medicine

## 2018-12-11 ENCOUNTER — Telehealth: Payer: Self-pay

## 2018-12-11 NOTE — Telephone Encounter (Signed)
Requesting lab results. Please call pt back.  

## 2019-01-08 ENCOUNTER — Encounter: Payer: Self-pay | Admitting: Internal Medicine

## 2019-01-08 ENCOUNTER — Other Ambulatory Visit: Payer: Self-pay

## 2019-01-08 ENCOUNTER — Ambulatory Visit (INDEPENDENT_AMBULATORY_CARE_PROVIDER_SITE_OTHER): Payer: Medicare HMO | Admitting: Internal Medicine

## 2019-01-08 VITALS — BP 128/78 | HR 78 | Temp 98.3°F | Ht 63.0 in | Wt 186.4 lb

## 2019-01-08 DIAGNOSIS — E1159 Type 2 diabetes mellitus with other circulatory complications: Secondary | ICD-10-CM

## 2019-01-08 DIAGNOSIS — Z79899 Other long term (current) drug therapy: Secondary | ICD-10-CM

## 2019-01-08 DIAGNOSIS — L2489 Irritant contact dermatitis due to other agents: Secondary | ICD-10-CM

## 2019-01-08 DIAGNOSIS — Z0000 Encounter for general adult medical examination without abnormal findings: Secondary | ICD-10-CM

## 2019-01-08 DIAGNOSIS — L259 Unspecified contact dermatitis, unspecified cause: Secondary | ICD-10-CM | POA: Diagnosis not present

## 2019-01-08 DIAGNOSIS — I10 Essential (primary) hypertension: Secondary | ICD-10-CM | POA: Diagnosis not present

## 2019-01-08 DIAGNOSIS — Z7984 Long term (current) use of oral hypoglycemic drugs: Secondary | ICD-10-CM

## 2019-01-08 DIAGNOSIS — N521 Erectile dysfunction due to diseases classified elsewhere: Secondary | ICD-10-CM

## 2019-01-08 LAB — GLUCOSE, CAPILLARY: Glucose-Capillary: 93 mg/dL (ref 70–99)

## 2019-01-08 MED ORDER — FREESTYLE LIBRE 2 SENSOR SYSTM MISC: 1.0000 | Freq: Three times a day (TID) | 0 refills | Status: AC

## 2019-01-08 MED ORDER — FREESTYLE LIBRE 2 READER SYSTM DEVI: 1.0000 | Freq: Three times a day (TID) | 0 refills | Status: AC

## 2019-01-08 MED ORDER — HYDROCORTISONE 1 % EX CREA: TOPICAL_CREAM | CUTANEOUS | 1 refills | Status: DC

## 2019-01-08 NOTE — Assessment & Plan Note (Signed)
Hypertension: Well-controlled on amlodipine, losartan, Toprol-XL, spironolactone.  He remains asymptomatic and endorses compliance to medication.  BP Readings from Last 3 Encounters:  01/08/19 128/78  08/21/18 (!) 157/100  04/30/18 (!) 144/69   Plan: -Continue amlodipine 10 mg daily -Continue losartan 100 mg daily -Continue Toprol-XL 100 mg daily -Continue spironolactone 25 mg daily

## 2019-01-08 NOTE — Assessment & Plan Note (Signed)
Contact dermatitis/rash: John Dickerson states that for the past month he has been experiencing a pruritic rash located at the left calf, left upper extremity and left thigh.  He denies any recent changes with his laundry detergent, lotion or soap.  He does however state that he has been pulling weeds/leaves in his backyard recently.  Since onset, he has been using an over-the-counter cortisone cream.   No obvious vesicular lesions, nontender to palpation, scaly on patient.   Plan: - Avoid triggers -Trial of 1% hydrocortisone cream to be applied after he takes a shower

## 2019-01-08 NOTE — Assessment & Plan Note (Signed)
Type 2 diabetes mellitus: He was found to have an A1c of 7.1 in March 2019.  Today, his A1c 6.2%.  He does monitor his CBGs at home but does not have paper with him today.  He is requesting continuous glucose monitor and I have placed in a prescription for freestyle libre reader and sensor.  I also made an appointment for him to see [name].  Plan: -Continue metformin 1000 mg twice daily -Follow-up 3 months for A1c

## 2019-01-08 NOTE — Assessment & Plan Note (Signed)
I will schedule him for colonoscopy.

## 2019-01-08 NOTE — Addendum Note (Signed)
Addended by: Jean Rosenthal on: 01/08/2019 04:37 PM   Modules accepted: Orders

## 2019-01-08 NOTE — Progress Notes (Signed)
   CC: Follow-up hypertension, diabetes, new skin rash.  HPI:  [name] is a 58 y.o. with medical history listed below presenting to follow-up on chronic medical problems.  Please see problem based charting for further details.  Past Medical History:  Diagnosis Date  . Anemia   . Anemia due to medication 09/24/2015  . Arthritis    "feet" (11/19/2015)  . CAD (coronary artery disease), native coronary artery    a. remote LAD stent 1990s. b. s/p DESx2 to LAD 11/2015  . Hepatitis A immune 11/20/2016  . Hepatitis B immune 11/20/2016  . Hepatitis C    "going thru the treatments" (11/19/2015)  . High cholesterol   . History of gout    "just recently" (11/19/2015)  . HIV (human immunodeficiency virus infection) ([place])   . Hypertension   . Posterior pain of left hip 03/01/2018  . Type II diabetes mellitus ([place])    diagnosed 04-2015   Review of Systems:   As per HPI  Physical Exam:  Vitals:   01/08/19 1529  BP: 128/78  Pulse: 78  Temp: 98.3 F (36.8 C)  TempSrc: Oral  SpO2: 99%  Weight: 186 lb 6.4 oz (84.6 kg)  Height: 5\' 3"  (1.6 m)   Physical Exam Vitals signs and nursing note reviewed.  Cardiovascular:     Rate and Rhythm: Normal rate.     Heart sounds: Normal heart sounds. No murmur. No friction rub. No gallop.   Pulmonary:     Effort: Pulmonary effort is normal.     Breath sounds: Normal breath sounds. No wheezing, rhonchi or rales.  Abdominal:     General: Bowel sounds are normal.     Tenderness: There is no abdominal tenderness.  Musculoskeletal:     Right lower leg: No edema.     Left lower leg: No edema.  Skin:    General: Skin is dry.     Coloration: Skin is not pale.     Findings: Rash present. No bruising or lesion.       Assessment & Plan:   See Encounters Tab for problem based charting.  Patient discussed with Dr. Dareen Piano

## 2019-01-08 NOTE — Patient Instructions (Addendum)
John Dickerson,  It was a pleasure taking care of you at the clinic today.  I am glad to hear that you are doing very well.  For the rashes on your legs, arms and back.  This is what we called contact dermatitis from exposure to the leaves you have been pulling.  I am prescribing you a stronger cortisone cream that you can try.  For the glucose monitoring, there is a new device that has, called freestyle libre.  I have given you a prescription for this.  There is a new device coming out on July 27.  I would like for you to make an appointment to see our diabetes coordinator [name] who will show you how to use this device.  Take Care! Dr. Eileen Stanford  Please call the internal medicine center clinic if you have any questions or concerns, we may be able to help and keep you from a long and expensive emergency room wait. Our clinic and after hours phone number is [phone], the best time to call is Monday through Friday 9 am to 4 pm but there is always someone available 24/7 if you have an emergency. If you need medication refills please notify your pharmacy one week in advance and they will send [place] a request.

## 2019-01-09 ENCOUNTER — Telehealth: Payer: Self-pay | Admitting: *Deleted

## 2019-01-09 NOTE — Telephone Encounter (Signed)
Fax from [facility] is not covered by his ins plan. Please change/fax new rx. I will also ask Christene Slates for her help. Thanks

## 2019-01-09 NOTE — Progress Notes (Signed)
Internal Medicine Clinic Attending  Case discussed with Dr. Agyei at the time of the visit.  We reviewed the resident's history and exam and pertinent patient test results.  I agree with the assessment, diagnosis, and plan of care documented in the resident's note.    

## 2019-01-09 NOTE — Telephone Encounter (Signed)
Mr. Folks has Medicare. Here is Medicare CGM coverage guidance: Medicare criteria for personal CGM are:  -Have diabetes -Performing 4x/day testing -Insulin treated with 3 or more injections -Insulin treatment regimen requires frequent adjustment by the beneficiary on the basis of BGM or CGM testing results  -In-person visit with treating practitioner to evaluate diabetes control within 6 months prior to ordering CGM -Treating practitioner has an in-person visit with beneficently to assess adherence to the CGM regimen and diabetes treatment plan every 6 months following th initial prescription. Debera Lat, RD 01/09/2019 4:24 PM.

## 2019-01-15 ENCOUNTER — Encounter: Payer: Self-pay | Admitting: Dietician

## 2019-01-15 ENCOUNTER — Other Ambulatory Visit: Payer: Self-pay

## 2019-01-15 ENCOUNTER — Other Ambulatory Visit: Payer: Self-pay | Admitting: Dietician

## 2019-01-15 ENCOUNTER — Ambulatory Visit (INDEPENDENT_AMBULATORY_CARE_PROVIDER_SITE_OTHER): Payer: Medicare HMO | Admitting: Dietician

## 2019-01-15 DIAGNOSIS — E1159 Type 2 diabetes mellitus with other circulatory complications: Secondary | ICD-10-CM | POA: Diagnosis not present

## 2019-01-15 DIAGNOSIS — Z713 Dietary counseling and surveillance: Secondary | ICD-10-CM

## 2019-01-15 DIAGNOSIS — Z6833 Body mass index (BMI) 33.0-33.9, adult: Secondary | ICD-10-CM | POA: Diagnosis not present

## 2019-01-15 DIAGNOSIS — N521 Erectile dysfunction due to diseases classified elsewhere: Secondary | ICD-10-CM | POA: Diagnosis not present

## 2019-01-15 NOTE — Progress Notes (Signed)
Diabetes Self-Management Education  Visit Type:  Follow-up  Appt. Start Time: 918 Appt. End Time: 905  01/15/2019  [name], identified by name and date of birth, is a 58 y.o. male with a diagnosis of Diabetes:  [name]  Type 2 diabetes ASSESSMENT  Mr. [name] is here for his annual diabetes check up. He is doing well, but would like to take some weight off but did not have specific plans to achieve this goal. He hopes the gym will open back up. He does the shopping and cooking in his house. He takes a multivitamin with mineral daily. He has no questions or other concerns today. He is up to date on his health maintenance.   Weight 186 lb 14.4 oz (84.8 kg). Body mass index is 33.11 kg/m.   Lab Results  Component Value Date   HGBA1C 6.2 (H) 11/20/2018   HGBA1C 7.1 09/14/2017   HGBA1C 5.6 12/01/2016   HGBA1C 6.0 07/21/2016   HGBA1C 5.3 01/28/2016    Wt Readings from Last 5 Encounters:  01/15/19 186 lb 14.4 oz (84.8 kg)  01/08/19 186 lb 6.4 oz (84.6 kg)  08/21/18 188 lb (85.3 kg)  04/30/18 188 lb 1.6 oz (85.3 kg)  03/01/18 182 lb 3.2 oz (82.6 kg)    Diabetes Self-Management Education - 01/15/19 0800      Health Coping   How would you rate your overall health?  Good      Psychosocial Assessment   Patient Belief/Attitude about Diabetes  Motivated to manage diabetes    Self-management support  Family      Pre-Education Assessment   Patient understands the diabetes disease and treatment process.  Demonstrates understanding / competency    Patient understands incorporating nutritional management into lifestyle.  Demonstrates understanding / competency    Patient undertands incorporating physical activity into lifestyle.  Demonstrates understanding / competency    Patient understands using medications safely.  Demonstrates understanding / competency    Patient understands monitoring blood glucose, interpreting and using results  Demonstrates understanding / competency     Patient understands prevention, detection, and treatment of acute complications.  Demonstrates understanding / competency    Patient understands prevention, detection, and treatment of chronic complications.  Needs Review    Patient understands how to develop strategies to address psychosocial issues.  Demonstrates understanding / competency    Patient understands how to develop strategies to promote health/change behavior.  Needs Review      Complications   Last HgB A1C per patient/outside source  6.2 %    How often do you check your blood sugar?  0 times/day (not testing)    Have you had a dilated eye exam in the past 12 months?  Yes    Have you had a dental exam in the past 12 months?  No   assisted him in finding a dentst in network with his insuran   Are you checking your feet?  Yes    How many days per week are you checking your feet?  7      Dietary Intake   Breakfast  10 am -decaf coffee sometimes, cold cheerios cereal, 2 boiled egg, toast, 1% milk    Snack (morning)  takes meds    Lunch  tunafish sandwich, chips, boild egg, grapefruit    Dinner  wings, catrrots ansd celery dip, ranch dressding water or meat, vegetables, chickenbroccil, aspargus    Snack (evening)  nuts    Beverage(s)  water, oj- daily  Exercise   Exercise Type  Light (walking / raking leaves);Moderate (swimming / aerobic walking)    How many days per week to you exercise?  5    How many minutes per day do you exercise?  60    Total minutes per week of exercise  300      Patient Education   Chronic complications  Dental care    Personal strategies to promote health  Helped patient develop diabetes management plan for (enter comment)   changes he can make to help him with his goal of wt loss     Individualized Goals (developed by patient)   Nutrition  Other (comment)   eat fruits/smoothie nmore often instread of dougnuts, ch     Outcomes   Program Status  Completed      Subsequent Visit   Since  your last visit have you continued or begun to take your medications as prescribed?  Yes    Since your last visit have you had your blood pressure checked?  Yes    Is your most recent blood pressure lower, unchanged, or higher since your last visit?  Lower    Since your last visit have you experienced any weight changes?  No change    Since your last visit, are you checking your blood glucose at least once a day?  No       Learning Objective:  Patient will have a greater understanding of diabetes self-management. Patient education plan is to attend individual and/or group sessions per assessed needs and concerns.  My plan to support myself in continuing these changes to care for my diabetes is to attend or contact:   His wife, doctor's office CDE and the local gym and fitness center   Plan:   Patient Instructions   You are doing great overal with your diabetes and nutrition!!   To help with weight loss- try to find lower calorie foods to eat   Muffins instead of doughnuts Veggies instead chips   Give [place] a call about the flu shot in a few weeks.  Call [facility] about a dentist in network  See you next August 2021.   John Dickerson [phone]     Expected Outcomes:  Demonstrated interest in learning. Expect positive outcomes  Education material provided: My Plate  If problems or questions, patient to contact team via:  Phone  Future DSME appointment: - John Dickerson, RD 01/15/2019 9:12 AM.

## 2019-01-15 NOTE — Progress Notes (Unsigned)
Referral request

## 2019-01-15 NOTE — Patient Instructions (Addendum)
You are doing great overal with your diabetes and nutrition!!   To help with weight loss- try to find lower calorie foods to eat   Muffins instead of doughnuts Veggies instead chips   Give [place] a call about the flu shot in a few weeks.  Call [facility] about a dentist in network  See you next August 2021.   [name] [phone]

## 2019-01-16 ENCOUNTER — Other Ambulatory Visit: Payer: Self-pay | Admitting: Internal Medicine

## 2019-01-16 NOTE — Progress Notes (Signed)
Hi Donna,   I'm not seeing the order.

## 2019-01-16 NOTE — Addendum Note (Signed)
Addended by: Hulan Fray on: 01/16/2019 06:53 PM   Modules accepted: Orders

## 2019-01-16 NOTE — Telephone Encounter (Signed)
Needs refill on spironolactone (ALDACTONE) 25 MG tablet  ;pt contact John Dickerson, John Dickerson - [place]

## 2019-01-16 NOTE — Progress Notes (Unsigned)
It is in an Orders Only document from yesterday. I think you can go through patient station, double click it to open, then sign or find it in chart review, then encounter, then sign.

## 2019-01-17 ENCOUNTER — Other Ambulatory Visit: Payer: Self-pay | Admitting: Interventional Cardiology

## 2019-01-17 MED ORDER — SPIRONOLACTONE 25 MG PO TABS: ORAL_TABLET | ORAL | 0 refills | Status: DC

## 2019-01-17 NOTE — Telephone Encounter (Signed)
Pls contact pt [phone], pt calling regarding medicine

## 2019-01-17 NOTE — Telephone Encounter (Signed)
Call to patient to let him know that medication was refilled today by Dr Irish Lack.Despina Hidden Cassady7/31/202012:02 PM

## 2019-01-18 ENCOUNTER — Other Ambulatory Visit: Payer: Self-pay | Admitting: Internal Medicine

## 2019-01-21 ENCOUNTER — Other Ambulatory Visit: Payer: Self-pay | Admitting: Internal Medicine

## 2019-01-21 DIAGNOSIS — I10 Essential (primary) hypertension: Secondary | ICD-10-CM

## 2019-01-21 NOTE — Telephone Encounter (Signed)
metoprolol succinate (TOPROL-XL) 100 MG 24 hr tablet,  spironolactone (ALDACTONE) 25 MG tablet, refill request @  [id number] - [place], [address] AT [address] [phone] (Phone) [phone] (Fax)   Requesting meds to be for 90 days supply.

## 2019-01-30 ENCOUNTER — Other Ambulatory Visit: Payer: Self-pay | Admitting: Interventional Cardiology

## 2019-01-31 ENCOUNTER — Other Ambulatory Visit: Payer: Self-pay | Admitting: Internal Medicine

## 2019-01-31 DIAGNOSIS — E1159 Type 2 diabetes mellitus with other circulatory complications: Secondary | ICD-10-CM

## 2019-01-31 DIAGNOSIS — N521 Erectile dysfunction due to diseases classified elsewhere: Secondary | ICD-10-CM

## 2019-02-05 ENCOUNTER — Other Ambulatory Visit: Payer: Self-pay | Admitting: Interventional Cardiology

## 2019-02-05 MED ORDER — SPIRONOLACTONE 25 MG PO TABS: ORAL_TABLET | ORAL | 0 refills | Status: DC

## 2019-02-06 ENCOUNTER — Other Ambulatory Visit: Payer: Self-pay | Admitting: Infectious Diseases

## 2019-02-06 DIAGNOSIS — B20 Human immunodeficiency virus [HIV] disease: Secondary | ICD-10-CM

## 2019-02-25 ENCOUNTER — Other Ambulatory Visit: Payer: Self-pay | Admitting: Interventional Cardiology

## 2019-02-25 ENCOUNTER — Telehealth: Payer: Self-pay | Admitting: Internal Medicine

## 2019-02-25 MED ORDER — SPIRONOLACTONE 25 MG PO TABS: ORAL_TABLET | ORAL | 0 refills | Status: DC

## 2019-02-25 NOTE — Telephone Encounter (Signed)
Pt was calling about script from cardiology, gave him ph# to dr Irish Lack, he will call him

## 2019-02-25 NOTE — Telephone Encounter (Signed)
Pt is wanting someone to call back regarding his medicine [phone]

## 2019-02-27 ENCOUNTER — Encounter: Payer: Medicare HMO | Admitting: Internal Medicine

## 2019-03-02 NOTE — Progress Notes (Signed)
Cardiology Office Note   Date:  03/03/2019   ID:  John Dickerson, DOB 1960-08-05, MRN [id number]  PCP:  Jean Rosenthal, MD    No chief complaint on file.  CAD  Wt Readings from Last 3 Encounters:  03/03/19 185 lb 3.2 oz (84 kg)  01/15/19 186 lb 14.4 oz (84.8 kg)  01/08/19 186 lb 6.4 oz (84.6 kg)       History of Present Illness: John Dickerson is a 58 y.o. male  with history of CAD (remote LAD stent 1990s, s/p DESx2 to LAD 11/2015) HIV, DM, HTN, HLD, gout, anemia. Angina occurred with mowing the lawn.  Outpatient cath was done in June 2017demonstrating 80% mid LAD (in area previously treated with a stent) and 80% distal LAD, which were both treated with DES. He had residual nonobstructive disease otherwise - 50% ramus, 80% OM1, 70% ost LPDA, 50% mid-distal LAD. LVEF was normal. He was started on Plavix. Dr. Haroldine Laws discussed statin therapy with ID who cleared the patient for atorvastatin 65m daily. LFTs were normal 12/29/15. LDL in 10/2015 was 99. Of note, Hgb in 2016 was 15-16. In 2017 it has run in the 9-11 range.  Since the last visit, he has had some high BP readings up to the 150s.  He has been eating some extra salt.  He ate fried chicken and burgers last night.Last night was an exception.  Many readings at home are in the 1347Qsystolic as well.   Denies : Chest pain. Dizziness. Leg edema. Nitroglycerin use. Orthopnea. Palpitations. Paroxysmal nocturnal dyspnea. Shortness of breath. Syncope.   Walks for exercise.  He does keep his distance and wears a mask.  He is retired.    Past Medical History:  Diagnosis Date  . Anemia   . Anemia due to medication 09/24/2015  . Arthritis    "feet" (11/19/2015)  . CAD (coronary artery disease), native coronary artery    a. remote LAD stent 1990s. b. s/p DESx2 to LAD 11/2015  . Hepatitis A immune 11/20/2016  . Hepatitis B immune 11/20/2016  . Hepatitis C    "going thru the treatments" (11/19/2015)  . High cholesterol   .  History of gout    "just recently" (11/19/2015)  . [place] virus infection) [place]   . Hypertension   . Posterior pain of left hip 03/01/2018  . Type II diabetes mellitus ([place]    diagnosed 04-2015    Past Surgical History:  Procedure Laterality Date  . CARDIAC CATHETERIZATION N/A 11/19/2015   Procedure: Left Heart Cath and Coronary Angiography;  Surgeon: DJolaine Artist MD;  [facility];  Service: Cardiovascular;  Laterality: N/A;  . CARDIAC CATHETERIZATION N/A 11/19/2015   Procedure: Coronary Stent Intervention;  Surgeon: JJettie Booze MD;  [facility];  Service: Cardiovascular;  Laterality: N/A;  . CORONARY ANGIOPLASTY WITH STENT PLACEMENT  11/19/2015   "2 stents"     Current Outpatient Medications  Medication Sig Dispense Refill  . allopurinol (ZYLOPRIM) 100 MG tablet Take 1.5 tablets (150 mg total) by mouth daily. 135 tablet 6  . amLODipine (NORVASC) 10 MG tablet TAKE 1 TABLET(10 MG) BY MOUTH DAILY 90 tablet 1  . aspirin EC 81 MG tablet Take 1 tablet (81 mg total) by mouth daily. 90 tablet 3  . atorvastatin (LIPITOR) 20 MG tablet TAKE 1 TABLET(20 MG) BY MOUTH DAILY 90 tablet 1  . BIKTARVY 50-200-25 MG TABS tablet TAKE 1 TABLET BY  MOUTH DAILY 90 tablet 2  . blood glucose meter kit and supplies KIT Dispense based on patient and insurance preference. Use up to four times daily as directed. (FOR ICD-9 250.00, 250.01). 1 each 0  . Blood Glucose Monitoring Suppl ([place]) w/Device KIT 1 each by Does not apply route daily. 1 kit 0  . clopidogrel (PLAVIX) 75 MG tablet TAKE 1 TABLET BY MOUTH EVERY DAY 90 tablet 0  . Continuous Blood Gluc Receiver (FREESTYLE LIBRE 2 READER SYSTM) DEVI 1 Device by Does not apply route 3 (three) times daily. 1 Device 0  . Continuous Blood Gluc Sensor (FREESTYLE LIBRE 2 SENSOR SYSTM) MISC 1 Device by Does not apply route 3 (three) times daily. 1 each 0  . glucose blood (ONETOUCH VERIO)  test strip Check blood sugar up to 1 time a day 100 each 12  . losartan (COZAAR) 100 MG tablet TAKE 1 TABLET(100 MG) BY MOUTH DAILY 90 tablet 1  . metFORMIN (GLUCOPHAGE) 1000 MG tablet TAKE 1 TABLET BY MOUTH TWICE DAILY WITH A MEAL 180 tablet 0  . metoprolol succinate (TOPROL-XL) 100 MG 24 hr tablet TAKE 1 TABLET BY MOUTH EVERY DAY WITH OR IMMEDIATELY FOLLOWING A MEAL 90 tablet 0  . nitroGLYCERIN (NITROSTAT) 0.4 MG SL tablet Place 1 tablet (0.4 mg total) under the tongue every 5 (five) minutes as needed for chest pain. 25 tablet 3  . ONETOUCH DELICA LANCETS FINE MISC Check blood sugar up to 1 time a day 100 each 12  . spironolactone (ALDACTONE) 25 MG tablet TAKE 1 TABLET(25 MG) BY MOUTH DAILY. Please make overdue appt with Dr. Irish Lack before anymore refills. 3rd and Final attempt 15 tablet 0   No current facility-administered medications for this visit.     Allergies:   Patient has no known allergies.    Social History:  The patient  reports that he quit smoking about 4 years ago. His smoking use included cigarettes. He started smoking about 6 years ago. He has a 5.88 pack-year smoking history. He has never used smokeless tobacco. He reports that he does not drink alcohol or use drugs.   Family History:  The patient's family history includes Cancer in his mother; Diabetes in his mother; Hypertension in his father.    ROS:  Please see the history of present illness.   Otherwise, review of systems are positive for back pain.   All other systems are reviewed and negative.    PHYSICAL EXAM: VS:  BP (!) 156/80   Pulse 66   Ht '5\' 3"'  (1.6 m)   Wt 185 lb 3.2 oz (84 kg)   SpO2 99%   BMI 32.81 kg/m  , BMI Body mass index is 32.81 kg/m. GEN: Well nourished, well developed, in no acute distress  HEENT: normal  Neck: no JVD, carotid bruits, or masses Cardiac: RRR; no murmurs, rubs, or gallops,no edema  Respiratory:  clear to auscultation bilaterally, normal work of breathing GI: soft,  nontender, nondistended, + BS MS: no deformity or atrophy  Skin: warm and dry, no rash Neuro:  Strength and sensation are intact Psych: euthymic mood, full affect   EKG:   The ekg ordered today demonstrates NSR,old anterior MI, PVCs   Recent Labs: 08/12/2018: ALT 23; Hemoglobin 13.8; Platelets 356 11/20/2018: BUN 28; Creatinine, Ser 1.36; Potassium 5.1; Sodium 139   Lipid Panel    Component Value Date/Time   CHOL 145 08/12/2018 0855   CHOL 158 11/03/2015 1013   TRIG 81 08/12/2018  0855   HDL 39 (L) 08/12/2018 0855   HDL 39 (L) 11/03/2015 1013   CHOLHDL 3.7 08/12/2018 0855   VLDL 9 03/22/2016 1155   LDLCALC 89 08/12/2018 0855     Other studies Reviewed: Additional studies/ records that were reviewed today with results demonstrating: cath results; labs reviewed.   ASSESSMENT AND PLAN:  1. CAD: No angina.  COntinue aggressive prevention.   2. HTN: Some high readings.  I asked him to check at home.  He is planning on going back to the gym to increase exercise. Decrease salt intake.  Will give info about low salt diet.   3. Hyperlipidemia: LDL 89 in 07/2018.  4. HIV: Controlled per his report.   Current medicines are reviewed at length with the patient today.  The patient concerns regarding his medicines were addressed.  The following changes have been made:  No change  Labs/ tests ordered today include:  No orders of the defined types were placed in this encounter.   Recommend 150 minutes/week of aerobic exercise Low fat, low carb, high fiber diet recommended  Disposition:   FU in 1 year   Signed, Larae Grooms, MD  03/03/2019 8:38 AM    [facility] [address], [place], [place]  [address] Phone: [phone]; Fax: [phone]

## 2019-03-03 ENCOUNTER — Other Ambulatory Visit: Payer: Self-pay

## 2019-03-03 ENCOUNTER — Ambulatory Visit (INDEPENDENT_AMBULATORY_CARE_PROVIDER_SITE_OTHER): Payer: Medicare HMO | Admitting: Interventional Cardiology

## 2019-03-03 ENCOUNTER — Encounter: Payer: Self-pay | Admitting: Interventional Cardiology

## 2019-03-03 VITALS — BP 156/80 | HR 66 | Ht 63.0 in | Wt 185.2 lb

## 2019-03-03 DIAGNOSIS — E782 Mixed hyperlipidemia: Secondary | ICD-10-CM

## 2019-03-03 DIAGNOSIS — I2510 Atherosclerotic heart disease of native coronary artery without angina pectoris: Secondary | ICD-10-CM | POA: Diagnosis not present

## 2019-03-03 DIAGNOSIS — Z95820 Peripheral vascular angioplasty status with implants and grafts: Secondary | ICD-10-CM | POA: Diagnosis not present

## 2019-03-03 DIAGNOSIS — I10 Essential (primary) hypertension: Secondary | ICD-10-CM | POA: Diagnosis not present

## 2019-03-03 DIAGNOSIS — R0609 Other forms of dyspnea: Secondary | ICD-10-CM

## 2019-03-03 LAB — LIPID PANEL
Chol/HDL Ratio: 3.6 ratio (ref 0.0–5.0)
Cholesterol, Total: 164 mg/dL (ref 100–199)
HDL: 45 mg/dL (ref >39)
LDL Chol Calc (NIH): 95 mg/dL (ref 0–99)
Triglycerides: 133 mg/dL (ref 0–149)
VLDL Cholesterol Cal: 24 mg/dL (ref 5–40)

## 2019-03-03 LAB — COMPREHENSIVE METABOLIC PANEL
ALT: 27 IU/L (ref 0–44)
AST: 22 IU/L (ref 0–40)
Albumin/Globulin Ratio: 1.6 (ref 1.2–2.2)
Albumin: 4.4 g/dL (ref 3.8–4.9)
Alkaline Phosphatase: 75 IU/L (ref 39–117)
BUN/Creatinine Ratio: 18 (ref 10–24)
BUN: 23 mg/dL (ref 8–27)
Bilirubin Total: 0.2 mg/dL (ref 0.0–1.2)
CO2: 20 mmol/L (ref 20–29)
Calcium: 10.1 mg/dL (ref 8.6–10.2)
Chloride: 102 mmol/L (ref 96–106)
Creatinine, Ser: 1.25 mg/dL (ref 0.76–1.27)
GFR calc Af Amer: 72 mL/min/{1.73_m2} (ref >59)
GFR calc non Af Amer: 62 mL/min/{1.73_m2} (ref >59)
Globulin, Total: 2.8 g/dL (ref 1.5–4.5)
Glucose: 134 mg/dL — ABNORMAL HIGH (ref 65–99)
Potassium: 5.2 mmol/L (ref 3.5–5.2)
Sodium: 138 mmol/L (ref 134–144)
Total Protein: 7.2 g/dL (ref 6.0–8.5)

## 2019-03-03 MED ORDER — SPIRONOLACTONE 25 MG PO TABS: ORAL_TABLET | ORAL | 3 refills | Status: DC

## 2019-03-03 NOTE — Patient Instructions (Signed)
Medication Instructions:  Your physician recommends that you continue on your current medications as directed. Please refer to the Current Medication list given to you today.   If you need a refill on your cardiac medications before your next appointment, please call your pharmacy.   Lab work: Your physician recommends that you return for lab work today: [place]  If you have labs (blood work) drawn today and your tests are completely normal, you will receive your results only by: [name] MyChart Message (if you have MyChart) OR . A paper copy in the mail If you have any lab test that is abnormal or we need to change your treatment, we will call you to review the results.  Testing/Procedures: None Ordered  Follow-Up: At [facility], you and your health needs are our priority.  As part of our continuing mission to provide you with exceptional heart care, we have created designated Provider Care Teams.  These Care Teams include your primary Cardiologist (physician) and Advanced Practice Providers (APPs -  Physician Assistants and Nurse Practitioners) who all work together to provide you with the care you need, when you need it. You will need a follow up appointment in 1 years.  Please call our office 2 months in advance to schedule this appointment.  You may see Dr. Irish Lack or one of the following Advanced Practice Providers on your designated Care Team:   [place], PA-C Melina Copa, PA-C . Ermalinda Barrios, PA-C

## 2019-03-10 ENCOUNTER — Other Ambulatory Visit: Payer: Self-pay

## 2019-03-10 MED ORDER — CLOPIDOGREL BISULFATE 75 MG PO TABS: 75.0000 mg | ORAL_TABLET | Freq: Every day | ORAL | 0 refills | Status: DC

## 2019-03-10 NOTE — Telephone Encounter (Signed)
spironolactone (ALDACTONE) 25 MG tablet   clopidogrel (PLAVIX) 75 MG tablet   Refill request @  [facility] [place], [address] [place] [phone] (Phone) [phone] (Fax)

## 2019-03-10 NOTE — Telephone Encounter (Signed)
Spironolactone #90 with 3 refills sent 03/03/2019. Hubbard Hartshorn, RN, BSN

## 2019-03-12 ENCOUNTER — Other Ambulatory Visit: Payer: Self-pay | Admitting: Interventional Cardiology

## 2019-03-12 ENCOUNTER — Other Ambulatory Visit: Payer: Self-pay | Admitting: Infectious Diseases

## 2019-03-12 DIAGNOSIS — B20 Human immunodeficiency virus [HIV] disease: Secondary | ICD-10-CM

## 2019-03-12 MED ORDER — SPIRONOLACTONE 25 MG PO TABS: ORAL_TABLET | ORAL | 3 refills | Status: DC

## 2019-03-13 ENCOUNTER — Other Ambulatory Visit: Payer: Self-pay | Admitting: Infectious Diseases

## 2019-03-13 DIAGNOSIS — B20 Human immunodeficiency virus [HIV] disease: Secondary | ICD-10-CM

## 2019-03-21 ENCOUNTER — Other Ambulatory Visit: Payer: Self-pay

## 2019-03-21 DIAGNOSIS — E781 Pure hyperglyceridemia: Secondary | ICD-10-CM

## 2019-03-21 MED ORDER — ATORVASTATIN CALCIUM 40 MG PO TABS: 40.0000 mg | ORAL_TABLET | Freq: Every day | ORAL | 3 refills | Status: DC

## 2019-04-16 ENCOUNTER — Ambulatory Visit (INDEPENDENT_AMBULATORY_CARE_PROVIDER_SITE_OTHER): Payer: Medicare HMO | Admitting: Internal Medicine

## 2019-04-16 ENCOUNTER — Encounter: Payer: Self-pay | Admitting: Internal Medicine

## 2019-04-16 ENCOUNTER — Other Ambulatory Visit: Payer: Self-pay

## 2019-04-16 VITALS — BP 129/69 | HR 69 | Temp 98.3°F | Ht 63.0 in | Wt 189.8 lb

## 2019-04-16 DIAGNOSIS — E1159 Type 2 diabetes mellitus with other circulatory complications: Secondary | ICD-10-CM

## 2019-04-16 DIAGNOSIS — Z79899 Other long term (current) drug therapy: Secondary | ICD-10-CM

## 2019-04-16 DIAGNOSIS — N521 Erectile dysfunction due to diseases classified elsewhere: Secondary | ICD-10-CM | POA: Diagnosis not present

## 2019-04-16 DIAGNOSIS — Z0000 Encounter for general adult medical examination without abnormal findings: Secondary | ICD-10-CM

## 2019-04-16 DIAGNOSIS — M7918 Myalgia, other site: Secondary | ICD-10-CM | POA: Diagnosis not present

## 2019-04-16 DIAGNOSIS — I10 Essential (primary) hypertension: Secondary | ICD-10-CM | POA: Diagnosis not present

## 2019-04-16 DIAGNOSIS — Z7984 Long term (current) use of oral hypoglycemic drugs: Secondary | ICD-10-CM

## 2019-04-16 LAB — POCT GLYCOSYLATED HEMOGLOBIN (HGB A1C): Hemoglobin A1C: 6.3 % — AB (ref 4.0–5.6)

## 2019-04-16 LAB — GLUCOSE, CAPILLARY: Glucose-Capillary: 96 mg/dL (ref 70–99)

## 2019-04-16 NOTE — Assessment & Plan Note (Signed)
Health Maintenance: -Colonoscopy

## 2019-04-16 NOTE — Assessment & Plan Note (Addendum)
Left gluteal pain: He reports of a 1 year history of pain in his left gluteus the worsens with ambulation and improves with flexion of his spine.  He states there are times that the pain will worsen when he is in a supine position and improves with an upright position.  He used to try Tylenol which somewhat helped with his pain but he is not keen to taking pills.  On physical exams, the left gluteus is nontender to palpation, straight leg test is negative bilaterally and there is no palpable fluid pocket.  Assessment: His illness prescription is consistent with spinal stenosis however location is unusual.  Osteoarthritis less likely as the pain will most likely be in his groin, piriformis syndrome also very less likely as he denies symptoms of sciatica, gluteal abscess also very less likely without systemic symptoms.  Plan: -Follow-up MRI -If unrevealing, will refer to sports medicine   ADDENDUM:  IMPRESSION: 1. Epidural lipomatosis with severe spinal stenosis and complete CSF effacement from L3-S1. Disc bulging and a posterior disc protrusion contribute at L4-5. 2. Severe left neural foraminal stenosis at L4-5.

## 2019-04-16 NOTE — Assessment & Plan Note (Signed)
Type 2 diabetes mellitus: Well-controlled on monotherapy.  A1c today 6.3% from 6.2% when it was last checked.  Plan: -Continue metformin 1000 mg twice daily

## 2019-04-16 NOTE — Patient Instructions (Signed)
Mr. Nattress,  It was a pleasure taking care of the clinic today.  Your blood pressure is well today so I will make any medication changes.  Your diabetes is also well controlled continue eating well, exercising and taking care of yourself.  For the pain you are having at your left buttock, I will get an MRI to make sure that the bones and the nerves of your back.   Take Care! Dr. Eileen Stanford  Please call the internal medicine center clinic if you have any questions or concerns, we may be able to help and keep you from a long and expensive emergency room wait. Our clinic and after hours phone number is [phone], the best time to call is Monday through Friday 9 am to 4 pm but there is always someone available 24/7 if you have an emergency. If you need medication refills please notify your pharmacy one week in advance and they will send [place] a request.

## 2019-04-16 NOTE — Assessment & Plan Note (Signed)
Hypertension: Well controlled.  BP Readings from Last 3 Encounters:  04/16/19 129/69  03/03/19 (!) 156/80  01/08/19 128/78    Plan: -Continue amlodipine 10 mg daily -Continue losartan 100 mg daily -Continue Toprol-XL 100 mg daily -Continue spironolactone 25 mg daily

## 2019-04-16 NOTE — Progress Notes (Signed)
   CC: Follow up Hypertension   HPI:  [name] is a 58 y.o. with medical history listed below presented to follow-up on hypertension.  Please see problem based charting for further details.  Past Medical History:  Diagnosis Date  . Anemia   . Anemia due to medication 09/24/2015  . Arthritis    "feet" (11/19/2015)  . CAD (coronary artery disease), native coronary artery    a. remote LAD stent 1990s. b. s/p DESx2 to LAD 11/2015  . Hepatitis A immune 11/20/2016  . Hepatitis B immune 11/20/2016  . Hepatitis C    "going thru the treatments" (11/19/2015)  . High cholesterol   . History of gout    "just recently" (11/19/2015)  . HIV (human immunodeficiency virus infection) ([place])   . Hypertension   . Posterior pain of left hip 03/01/2018  . Type II diabetes mellitus ([place])    diagnosed 04-2015   Review of Systems:  As per HPI  Physical Exam:  Vitals:   04/16/19 1455 04/16/19 1520  BP: (!) 146/76 129/69  Pulse: 79 69  Temp: 98.3 F (36.8 C)   TempSrc: Oral   SpO2: 98%   Weight: 189 lb 12.8 oz (86.1 kg)   Height: 5\' 3"  (1.6 m)    Physical Exam  Constitutional: He is well-developed, well-nourished, and in no distress.  Cardiovascular: Normal rate, regular rhythm and normal heart sounds.  No murmur heard. Pulmonary/Chest: Effort normal and breath sounds normal. He has no wheezes.  Musculoskeletal: Normal range of motion.        General: No tenderness, deformity or edema.     Comments: Negative straight leg test bilaterally    Assessment & Plan:   See Encounters Tab for problem based charting.  Patient discussed with Dr. Lynnae January

## 2019-04-18 NOTE — Progress Notes (Signed)
Internal Medicine Clinic Attending  Case discussed with Dr. Agyei at the time of the visit.  We reviewed the resident's history and exam and pertinent patient test results.  I agree with the assessment, diagnosis, and plan of care documented in the resident's note.    

## 2019-04-20 ENCOUNTER — Other Ambulatory Visit: Payer: Self-pay | Admitting: Internal Medicine

## 2019-04-20 DIAGNOSIS — I10 Essential (primary) hypertension: Secondary | ICD-10-CM

## 2019-04-21 ENCOUNTER — Encounter: Payer: Self-pay | Admitting: Gastroenterology

## 2019-04-25 ENCOUNTER — Other Ambulatory Visit: Payer: Self-pay | Admitting: *Deleted

## 2019-04-25 DIAGNOSIS — I10 Essential (primary) hypertension: Secondary | ICD-10-CM

## 2019-04-26 MED ORDER — METOPROLOL SUCCINATE ER 100 MG PO TB24: ORAL_TABLET | ORAL | 0 refills | Status: DC

## 2019-04-30 ENCOUNTER — Other Ambulatory Visit: Payer: Self-pay | Admitting: Interventional Cardiology

## 2019-04-30 MED ORDER — LOSARTAN POTASSIUM 100 MG PO TABS: ORAL_TABLET | ORAL | 3 refills | Status: DC

## 2019-05-09 ENCOUNTER — Other Ambulatory Visit (HOSPITAL_COMMUNITY)
Admission: RE | Admit: 2019-05-09 | Discharge: 2019-05-09 | Disposition: A | Discharge status: Home / Self Care | Payer: Medicare HMO | Source: Ambulatory Visit | Attending: Infectious Diseases | Admitting: Infectious Diseases

## 2019-05-09 ENCOUNTER — Other Ambulatory Visit: Payer: Self-pay

## 2019-05-09 ENCOUNTER — Other Ambulatory Visit: Payer: Medicare HMO

## 2019-05-09 DIAGNOSIS — B20 Human immunodeficiency virus [HIV] disease: Secondary | ICD-10-CM

## 2019-05-09 DIAGNOSIS — Z79899 Other long term (current) drug therapy: Secondary | ICD-10-CM

## 2019-05-09 DIAGNOSIS — Z113 Encounter for screening for infections with a predominantly sexual mode of transmission: Secondary | ICD-10-CM | POA: Insufficient documentation

## 2019-05-09 LAB — T-HELPER CELL (CD4) - (RCID CLINIC ONLY)
CD4 % Helper T Cell: 30 % — ABNORMAL LOW (ref 33–65)
CD4 T Cell Abs: 634 /uL (ref 400–1790)

## 2019-05-12 ENCOUNTER — Telehealth: Payer: Self-pay | Admitting: Internal Medicine

## 2019-05-12 NOTE — Telephone Encounter (Signed)
Pls contact pt regarding a referral; pt contact [phone]

## 2019-05-12 NOTE — Telephone Encounter (Signed)
Spoke with this patient. Pt was requesting his MRI Appointment which is already sch for 06/05/2019 with [facility].  Instructions and Directions given to the patient.

## 2019-05-16 LAB — LIPID PANEL
Cholesterol: 128 mg/dL (ref <200)
HDL: 38 mg/dL — ABNORMAL LOW (ref >=40)
LDL Cholesterol (Calc): 72 mg/dL (calc)
Non-HDL Cholesterol (Calc): 90 mg/dL (calc) (ref <130)
Total CHOL/HDL Ratio: 3.4 (calc) (ref <5.0)
Triglycerides: 97 mg/dL (ref <150)

## 2019-05-16 LAB — RPR: RPR Ser Ql: NONREACTIVE

## 2019-05-16 LAB — COMPREHENSIVE METABOLIC PANEL
AG Ratio: 1.5 (calc) (ref 1.0–2.5)
ALT: 22 U/L (ref 9–46)
AST: 19 U/L (ref 10–35)
Albumin: 4.4 g/dL (ref 3.6–5.1)
Alkaline phosphatase (APISO): 61 U/L (ref 35–144)
BUN/Creatinine Ratio: 19 (calc) (ref 6–22)
BUN: 28 mg/dL — ABNORMAL HIGH (ref 7–25)
CO2: 26 mmol/L (ref 20–32)
Calcium: 10.2 mg/dL (ref 8.6–10.3)
Chloride: 102 mmol/L (ref 98–110)
Creat: 1.49 mg/dL — ABNORMAL HIGH (ref 0.70–1.25)
Globulin: 2.9 g/dL (calc) (ref 1.9–3.7)
Glucose, Bld: 170 mg/dL — ABNORMAL HIGH (ref 65–99)
Potassium: 4.8 mmol/L (ref 3.5–5.3)
Sodium: 139 mmol/L (ref 135–146)
Total Bilirubin: 0.3 mg/dL (ref 0.2–1.2)
Total Protein: 7.3 g/dL (ref 6.1–8.1)

## 2019-05-16 LAB — CBC
HCT: 38.6 % (ref 38.5–50.0)
Hemoglobin: 13.3 g/dL (ref 13.2–17.1)
MCH: 32.1 pg (ref 27.0–33.0)
MCHC: 34.5 g/dL (ref 32.0–36.0)
MCV: 93.2 fL (ref 80.0–100.0)
MPV: 9.4 fL (ref 7.5–12.5)
Platelets: 339 10*3/uL (ref 140–400)
RBC: 4.14 10*6/uL — ABNORMAL LOW (ref 4.20–5.80)
RDW: 12.3 % (ref 11.0–15.0)
WBC: 6.1 10*3/uL (ref 3.8–10.8)

## 2019-05-16 LAB — HIV-1 RNA QUANT-NO REFLEX-BLD
HIV 1 RNA Quant: <20 copies/mL — AB
HIV-1 RNA Quant, Log: <1.3 Log copies/mL — AB

## 2019-05-22 ENCOUNTER — Encounter: Payer: Self-pay | Admitting: Physician Assistant

## 2019-05-22 ENCOUNTER — Telehealth: Payer: Self-pay | Admitting: General Surgery

## 2019-05-22 ENCOUNTER — Ambulatory Visit (INDEPENDENT_AMBULATORY_CARE_PROVIDER_SITE_OTHER): Payer: Medicare HMO | Admitting: Physician Assistant

## 2019-05-22 ENCOUNTER — Other Ambulatory Visit: Payer: Self-pay

## 2019-05-22 VITALS — BP 140/77 | HR 80 | Temp 98.3°F | Ht 64.0 in | Wt 187.6 lb

## 2019-05-22 DIAGNOSIS — Z7901 Long term (current) use of anticoagulants: Secondary | ICD-10-CM | POA: Diagnosis not present

## 2019-05-22 DIAGNOSIS — Z1211 Encounter for screening for malignant neoplasm of colon: Secondary | ICD-10-CM

## 2019-05-22 DIAGNOSIS — Z01818 Encounter for other preprocedural examination: Secondary | ICD-10-CM

## 2019-05-22 DIAGNOSIS — Z1159 Encounter for screening for other viral diseases: Secondary | ICD-10-CM | POA: Diagnosis not present

## 2019-05-22 DIAGNOSIS — I25810 Atherosclerosis of coronary artery bypass graft(s) without angina pectoris: Secondary | ICD-10-CM

## 2019-05-22 DIAGNOSIS — Z1212 Encounter for screening for malignant neoplasm of rectum: Secondary | ICD-10-CM

## 2019-05-22 MED ORDER — NA SULFATE-K SULFATE-MG SULF 17.5-3.13-1.6 GM/177ML PO SOLN: 1.0000 | Freq: Once | ORAL | 0 refills | Status: AC

## 2019-05-22 NOTE — Telephone Encounter (Signed)
OK to hold Plavix 5 days prior to colonoscopy.  

## 2019-05-22 NOTE — Telephone Encounter (Signed)
[facility] Pre-operative Risk Assessment     Request for surgical clearance:     Endoscopy Procedure  What type of surgery is being performed?     Colonoscopy  When is this surgery scheduled?     06/02/2019  What type of clearance is required ?   Pharmacy  Are there any medications that need to be held prior to surgery and how long? Plavix for 5 days  Practice name and name of physician performing surgery?      [facility]  What is your office phone and fax number?      Phone- [phone]  [phone]  Anesthesia type (None, local, MAC, general) ?       MAC

## 2019-05-22 NOTE — Telephone Encounter (Signed)
Contacted the patient and told him to take his last plavix on 05/27/2019. He will need to be off for 5 days prior to his colonoscopy which is scheduled 05/23/2019/ The patient verbalized understanding and repeated back what I explained.

## 2019-05-22 NOTE — Progress Notes (Signed)
____________________________________________________________  Attending physician addendum:  Thank you for sending this case to me. I have reviewed the entire note, and the outlined plan seems appropriate.  Keniesha Adderly Danis, MD  ____________________________________________________________  

## 2019-05-22 NOTE — Telephone Encounter (Signed)
   Primary Cardiologist: Larae Grooms, MD  Chart reviewed as part of pre-operative protocol coverage. Given past medical history and time since last visit, based on ACC/AHA guidelines, John Dickerson would be at acceptable risk for the planned procedure without further cardiovascular testing.   Per Dr. Irish Lack, it would be acceptable to hold Plavix 5 days prior to colonoscopy then resume once okay per procedure team.  I will route this recommendation to the requesting party via [place] fax function and remove from pre-op pool.  Please call with questions.  Kathyrn Drown, NP 05/22/2019, 10:35 AM

## 2019-05-22 NOTE — Progress Notes (Signed)
Chief Complaint: Preprocedural visit for screening colonoscopy  HPI:    John Dickerson is a 58 year old African-American male with a past medical history as listed below including chronic anticoagulation with Plavix for CAD status post stent in 2017 who was referred to me by Jean Rosenthal, MD for a preprocedure visit for screening colonoscopy.      Today, the patient presents clinic and explains that he is doing well with no GI complaints.  He was told that it is time for his screening colonoscopy.  Tells me he has no further heart issues since 2017 when he had stents placed.  Denies ever having to stop his anticoagulation in the past.    Currently patient is doing a celery cleanse to help him maintain more regular bowel movements.  This is helping.    Denies fever, chills, weight loss, anorexia, nausea, vomiting, heartburn, reflux, abdominal pain, change in bowel habits or blood in his stool.  Past Medical History:  Diagnosis Date  . Anemia   . Anemia due to medication 09/24/2015  . Arthritis    "feet" (11/19/2015)  . CAD (coronary artery disease), native coronary artery    a. remote LAD stent 1990s. b. s/p DESx2 to LAD 11/2015  . Hepatitis A immune 11/20/2016  . Hepatitis B immune 11/20/2016  . Hepatitis C    "going thru the treatments" (11/19/2015)  . High cholesterol   . History of gout    "just recently" (11/19/2015)  . HIV (human immunodeficiency virus infection) ([place])   . Hypertension   . Posterior pain of left hip 03/01/2018  . Type II diabetes mellitus ([place])    diagnosed 04-2015    Past Surgical History:  Procedure Laterality Date  . CARDIAC CATHETERIZATION N/A 11/19/2015   Procedure: Left Heart Cath and Coronary Angiography;  Surgeon: Jolaine Artist, MD;  Location: [facility];  Service: Cardiovascular;  Laterality: N/A;  . CARDIAC CATHETERIZATION N/A 11/19/2015   Procedure: Coronary Stent Intervention;  Surgeon: Jettie Booze, MD;  Location: [facility];   Service: Cardiovascular;  Laterality: N/A;  . CORONARY ANGIOPLASTY WITH STENT PLACEMENT  11/19/2015   "2 stents"    Current Outpatient Medications  Medication Sig Dispense Refill  . allopurinol (ZYLOPRIM) 100 MG tablet Take 1.5 tablets (150 mg total) by mouth daily. 135 tablet 6  . amLODipine (NORVASC) 10 MG tablet TAKE 1 TABLET(10 MG) BY MOUTH DAILY 90 tablet 1  . aspirin EC 81 MG tablet Take 1 tablet (81 mg total) by mouth daily. 90 tablet 3  . atorvastatin (LIPITOR) 40 MG tablet Take 1 tablet (40 mg total) by mouth daily at 6 PM. 90 tablet 3  . BIKTARVY 50-200-25 MG TABS tablet TAKE 1 TABLET BY MOUTH DAILY 90 tablet 2  . blood glucose meter kit and supplies KIT Dispense based on patient and insurance preference. Use up to four times daily as directed. (FOR ICD-9 250.00, 250.01). 1 each 0  . Blood Glucose Monitoring Suppl ([place]) w/Device KIT 1 each by Does not apply route daily. 1 kit 0  . clopidogrel (PLAVIX) 75 MG tablet Take 1 tablet (75 mg total) by mouth daily. 90 tablet 0  . Continuous Blood Gluc Receiver (FREESTYLE LIBRE 2 READER SYSTM) DEVI 1 Device by Does not apply route 3 (three) times daily. 1 Device 0  . Continuous Blood Gluc Sensor (FREESTYLE LIBRE 2 SENSOR SYSTM) MISC 1 Device by Does not apply route 3 (three) times daily. 1 each  0  . glucose blood (ONETOUCH VERIO) test strip Check blood sugar up to 1 time a day 100 each 12  . losartan (COZAAR) 100 MG tablet TAKE 1 TABLET(100 MG) BY MOUTH DAILY 90 tablet 3  . metFORMIN (GLUCOPHAGE) 1000 MG tablet TAKE 1 TABLET BY MOUTH TWICE DAILY WITH A MEAL 180 tablet 0  . metoprolol succinate (TOPROL-XL) 100 MG 24 hr tablet Take with or immediately following a meal. 90 tablet 0  . nitroGLYCERIN (NITROSTAT) 0.4 MG SL tablet Place 1 tablet (0.4 mg total) under the tongue every 5 (five) minutes as needed for chest pain. 25 tablet 3  . ONETOUCH DELICA LANCETS FINE MISC Check blood sugar up to 1 time a day 100 each 12  .  spironolactone (ALDACTONE) 25 MG tablet TAKE 1 TABLET(25 MG) BY MOUTH DAILY. 90 tablet 3   No current facility-administered medications for this visit.     Allergies as of 05/22/2019  . (No Known Allergies)    Family History  Problem Relation Age of Onset  . Diabetes Mother   . Cancer Mother        unkown type  . Hypertension Father   . Colon cancer Neg Hx     Social History   Socioeconomic History  . Marital status: Married    Spouse name: Not on file  . Number of children: Not on file  . Years of education: Not on file  . Highest education level: Not on file  Occupational History  . Not on file  Social Needs  . Financial resource strain: Not on file  . Food insecurity    Worry: Not on file    Inability: Not on file  . Transportation needs    Medical: Not on file    Non-medical: Not on file  Tobacco Use  . Smoking status: Former Smoker    Packs/day: 0.12    Years: 49.00    Pack years: 5.88    Types: Cigarettes    Start date: 07/20/2012    Quit date: 02/18/2015    Years since quitting: 4.2  . Smokeless tobacco: Never Used  Substance and Sexual Activity  . Alcohol use: No    Alcohol/week: 0.0 standard drinks  . Drug use: No  . Sexual activity: Yes    Partners: Female    Comment: pt. given condoms  Lifestyle  . Physical activity    Days per week: Not on file    Minutes per session: Not on file  . Stress: Not on file  Relationships  . Social Herbalist on phone: Not on file    Gets together: Not on file    Attends religious service: Not on file    Active member of club or organization: Not on file    Attends meetings of clubs or organizations: Not on file    Relationship status: Not on file  . Intimate partner violence    Fear of current or ex partner: Not on file    Emotionally abused: Not on file    Physically abused: Not on file    Forced sexual activity: Not on file  Other Topics Concern  . Not on file  Social History Narrative  . Not  on file    Review of Systems:    Constitutional: No weight loss, fever or chills Skin: No rash  Cardiovascular: No chest pain Respiratory: No SOB  Gastrointestinal: See HPI and otherwise negative Genitourinary: No dysuria  Neurological: No headache Musculoskeletal: No  new muscle or joint pain Hematologic: No bleeding Psychiatric: No history of depression or anxiety   Physical Exam:  Vital signs: BP 140/77   Pulse 80   Temp 98.3 F (36.8 C)   Ht '5\' 4"'  (1.626 m)   Wt 187 lb 9.6 oz (85.1 kg)   SpO2 98%   BMI 32.20 kg/m   Constitutional:   Pleasant AA male appears to be in NAD, Well developed, Well nourished, alert and cooperative Head:  Normocephalic and atraumatic. Eyes:   PEERL, EOMI. No icterus. Conjunctiva pink. Ears:  Normal auditory acuity. Neck:  Supple Throat: Oral cavity and pharynx without inflammation, swelling or lesion.  Respiratory: Respirations even and unlabored. Lungs clear to auscultation bilaterally.   No wheezes, crackles, or rhonchi.  Cardiovascular: Normal S1, S2. No MRG. Regular rate and rhythm. No peripheral edema, cyanosis or pallor.  Gastrointestinal:  Soft, nondistended, nontender. No rebound or guarding. Normal bowel sounds. No appreciable masses or hepatomegaly. Rectal:  Not performed.  Msk:  Symmetrical without gross deformities. Without edema, no deformity or joint abnormality.  Neurologic:  Alert and  oriented x4;  grossly normal neurologically.  Skin:   Dry and intact without significant lesions or rashes. Psychiatric: Demonstrates good judgement and reason without abnormal affect or behaviors.  MOST RECENT LABS AND IMAGING: CBC    Component Value Date/Time   WBC 6.1 05/09/2019 0901   RBC 4.14 (L) 05/09/2019 0901   HGB 13.3 05/09/2019 0901   HCT 38.6 05/09/2019 0901   PLT 339 05/09/2019 0901   MCV 93.2 05/09/2019 0901   MCH 32.1 05/09/2019 0901   MCHC 34.5 05/09/2019 0901   RDW 12.3 05/09/2019 0901   LYMPHSABS 2,419 08/12/2018 0855    MONOABS 0.5 09/22/2013 0902   EOSABS 151 08/12/2018 0855   BASOSABS 43 08/12/2018 0855    CMP     Component Value Date/Time   NA 139 05/09/2019 0901   NA 138 03/03/2019 0903   K 4.8 05/09/2019 0901   CL 102 05/09/2019 0901   CO2 26 05/09/2019 0901   GLUCOSE 170 (H) 05/09/2019 0901   BUN 28 (H) 05/09/2019 0901   BUN 23 03/03/2019 0903   CREATININE 1.49 (H) 05/09/2019 0901   CALCIUM 10.2 05/09/2019 0901   PROT 7.3 05/09/2019 0901   PROT 7.2 03/03/2019 0903   ALBUMIN 4.4 03/03/2019 0903   AST 19 05/09/2019 0901   ALT 22 05/09/2019 0901   ALKPHOS 75 03/03/2019 0903   BILITOT 0.3 05/09/2019 0901   BILITOT 0.2 03/03/2019 0903   GFRNONAA 62 03/03/2019 0903   GFRNONAA 58 (L) 08/12/2018 0855   GFRAA 72 03/03/2019 0903   GFRAA 67 08/12/2018 0855    Assessment: 1.  Screening for colorectal cancer: Patient is 29 and has never had a colonoscopy  2.  Chronic anticoagulation: With Plavix 3.  CAD: Status post stents in 2017  Plan: 1.  Patient was already scheduled for screening colonoscopy in the [place] with Dr. Loletha Carrow.  Discussed risks, benefits, limitations and alternatives and the patient agrees to proceed.  Patient will be Covid tested 2 days prior to exam. 2.  Patient was advised to hold his Plavix for 5 days prior to time of procedure.  We will communicate with his prescribing physician to ensure that holding his Plavix is acceptable for him. 3.  Patient to follow in clinic per recommendations from Dr. Loletha Carrow after time of procedure.  Ellouise Newer, PA-C [facility] 05/22/2019, 9:18 AM  Cc: Jean Rosenthal, MD

## 2019-05-22 NOTE — Patient Instructions (Signed)
If you are age 60 or older, your body mass index should be between 23-30. Your Body mass index is 32.2 kg/m. If this is out of the aforementioned range listed, please consider follow up with your Primary Care Provider.  If you are age 23 or younger, your body mass index should be between 19-25. Your Body mass index is 32.2 kg/m. If this is out of the aformentioned range listed, please consider follow up with your Primary Care Provider.    You have been scheduled for a colonoscopy. Please follow written instructions given to you at your visit today.  Please pick up your prep supplies at the pharmacy within the next 1-3 days. If you use inhalers (even only as needed), please bring them with you on the day of your procedure.  You will be contaced by our office prior to your procedure for directions on holding your plavix. If you do not hear from our office 1 week prior to your scheduled procedure, please call [phone] to discuss.   Thank you for choosing me and [facility]

## 2019-05-22 NOTE — Telephone Encounter (Signed)
Dr. Irish Lack-  Can you please make recommendations on holding Plavix for 5 days prior to colonoscopy on 06/02/2019 although it seems there would be no contraindication at this time?   He has a prior history CAD with remote stenting to the LAD in the 1990s and DES x2 to the LAD in 11/2015.  Also with a history of hypertension, hyperlipidemia, HLD and DM2.  You last saw the patient 03/03/2019 and he was doing well from a cardiac perspective with no anginal symptoms.  Please send your recommendations to the preop pool  Thank you Sharee Pimple

## 2019-05-23 ENCOUNTER — Ambulatory Visit: Payer: Medicare HMO | Admitting: Infectious Diseases

## 2019-05-23 ENCOUNTER — Encounter: Payer: Medicare HMO | Admitting: Infectious Diseases

## 2019-05-29 ENCOUNTER — Other Ambulatory Visit: Payer: Self-pay | Admitting: Gastroenterology

## 2019-05-29 ENCOUNTER — Ambulatory Visit (INDEPENDENT_AMBULATORY_CARE_PROVIDER_SITE_OTHER): Payer: Medicare HMO

## 2019-05-29 ENCOUNTER — Encounter: Payer: Self-pay | Admitting: Gastroenterology

## 2019-05-29 DIAGNOSIS — Z1159 Encounter for screening for other viral diseases: Secondary | ICD-10-CM

## 2019-05-29 LAB — SARS CORONAVIRUS 2 (TAT 6-24 HRS): SARS Coronavirus 2: NEGATIVE

## 2019-05-30 ENCOUNTER — Other Ambulatory Visit: Payer: Self-pay | Admitting: *Deleted

## 2019-05-30 LAB — MOLECULAR ANCILLARY ONLY
Chlamydia: NEGATIVE
Comment: NEGATIVE
Comment: NORMAL
Neisseria Gonorrhea: NEGATIVE

## 2019-05-30 MED ORDER — CLOPIDOGREL BISULFATE 75 MG PO TABS: 75.0000 mg | ORAL_TABLET | Freq: Every day | ORAL | 0 refills | Status: DC

## 2019-06-02 ENCOUNTER — Encounter: Payer: Self-pay | Admitting: Gastroenterology

## 2019-06-02 ENCOUNTER — Other Ambulatory Visit: Payer: Self-pay

## 2019-06-02 ENCOUNTER — Ambulatory Visit (AMBULATORY_SURGERY_CENTER): Payer: Medicare HMO | Admitting: Gastroenterology

## 2019-06-02 VITALS — BP 137/84 | HR 75 | Temp 98.4°F | Resp 18 | Ht 64.0 in | Wt 187.0 lb

## 2019-06-02 DIAGNOSIS — Z1211 Encounter for screening for malignant neoplasm of colon: Secondary | ICD-10-CM | POA: Diagnosis not present

## 2019-06-02 MED ORDER — SODIUM CHLORIDE 0.9 % IV SOLN: 500.0000 mL | Freq: Once | INTRAVENOUS | Status: DC

## 2019-06-02 NOTE — Patient Instructions (Signed)
Impression/Recommendations:  Resume previous diet. Continue present medications.  Resume Plavix (clopidogreal) at prior dose today.  Repeat colonoscopy in 10 years for screening purposes.  YOU HAD AN ENDOSCOPIC PROCEDURE TODAY AT [facility]:   Refer to the procedure report that was given to you for any specific questions about what was found during the examination.  If the procedure report does not answer your questions, please call your gastroenterologist to clarify.  If you requested that your care partner not be given the details of your procedure findings, then the procedure report has been included in a sealed envelope for you to review at your convenience later.  YOU SHOULD EXPECT: Some feelings of bloating in the abdomen. Passage of more gas than usual.  Walking can help get rid of the air that was put into your GI tract during the procedure and reduce the bloating. If you had a lower endoscopy (such as a colonoscopy or flexible sigmoidoscopy) you may notice spotting of blood in your stool or on the toilet paper. If you underwent a bowel prep for your procedure, you may not have a normal bowel movement for a few days.  Please Note:  You might notice some irritation and congestion in your nose or some drainage.  This is from the oxygen used during your procedure.  There is no need for concern and it should clear up in a day or so.  SYMPTOMS TO REPORT IMMEDIATELY:   Following lower endoscopy (colonoscopy or flexible sigmoidoscopy):  Excessive amounts of blood in the stool  Significant tenderness or worsening of abdominal pains  Swelling of the abdomen that is new, acute  Fever of 100F or higher  For urgent or emergent issues, a gastroenterologist can be reached at any hour by calling [phone].   DIET:  We do recommend a small meal at first, but then you may proceed to your regular diet.  Drink plenty of fluids but you should avoid alcoholic beverages for 24  hours.  ACTIVITY:  You should plan to take it easy for the rest of today and you should NOT DRIVE or use heavy machinery until tomorrow (because of the sedation medicines used during the test).    FOLLOW UP: Our staff will call the number listed on your records 48-72 hours following your procedure to check on you and address any questions or concerns that you may have regarding the information given to you following your procedure. If we do not reach you, we will leave a message.  We will attempt to reach you two times.  During this call, we will ask if you have developed any symptoms of COVID 19. If you develop any symptoms (ie: fever, flu-like symptoms, shortness of breath, cough etc.) before then, please call [phone].  If you test positive for Covid 19 in the 2 weeks post procedure, please call and report this information to [place].    If any biopsies were taken you will be contacted by phone or by letter within the next 1-3 weeks.  Please call us at [phone] if you have not heard about the biopsies in 3 weeks.    SIGNATURES/CONFIDENTIALITY: You and/or your care partner have signed paperwork which will be entered into your electronic medical record.  These signatures attest to the fact that that the information above on your After Visit Summary has been reviewed and is understood.  Full responsibility of the confidentiality of this discharge information lies with you and/or your care-partner.

## 2019-06-02 NOTE — Progress Notes (Signed)
To PACU, VSS. Report to [email or web address] 

## 2019-06-02 NOTE — Op Note (Signed)
[name] Procedure Date: 06/02/2019 9:56 AM MRN: [id number] Endoscopist: Mallie Mussel L. Loletha Carrow , MD Age: 58 Referring MD:  Date of Birth: 03/15/1961 Gender: Male Account #: [id number] Procedure:                Colonoscopy Indications:              Screening for colorectal malignant neoplasm, This                            is the patient's first colonoscopy Medicines:                Monitored Anesthesia Care Procedure:                Pre-Anesthesia Assessment:                           - Prior to the procedure, a History and Physical                            was performed, and patient medications and                            allergies were reviewed. The patient's tolerance of                            previous anesthesia was also reviewed. The risks                            and benefits of the procedure and the sedation                            options and risks were discussed with the patient.                            All questions were answered, and informed consent                            was obtained. Prior Anticoagulants: The patient                            last took Plavix (clopidogrel) 5 days prior to the                            procedure (remained on aspirin). ASA Grade                            Assessment: III - A patient with severe systemic                            disease. After reviewing the risks and benefits,                            the patient was deemed in satisfactory condition to  undergo the procedure.                           After obtaining informed consent, the colonoscope                            was passed under direct vision. Throughout the                            procedure, the patient's blood pressure, pulse, and                            oxygen saturations were monitored continuously. The                            Colonoscope was introduced through the anus  and                            advanced to the the cecum, identified by                            appendiceal orifice and ileocecal valve. The                            colonoscopy was performed without difficulty. The                            patient tolerated the procedure well. The quality                            of the bowel preparation was excellent. The                            ileocecal valve, appendiceal orifice, and rectum                            were photographed. Scope In: 10:11:50 AM Scope Out: 10:26:41 AM Scope Withdrawal Time: 0 hours 6 minutes 20 seconds  Total Procedure Duration: 0 hours 14 minutes 51 seconds  Findings:                 The perianal and digital rectal examinations were                            normal.                           The entire examined colon appeared normal on direct                            and retroflexion views. Complications:            No immediate complications. Estimated Blood Loss:     Estimated blood loss: none. Impression:               - The entire examined colon is normal on direct and  retroflexion views.                           - No specimens collected. Recommendation:           - Patient has a contact number available for                            emergencies. The signs and symptoms of potential                            delayed complications were discussed with the                            patient. Return to normal activities tomorrow.                            Written discharge instructions were provided to the                            patient.                           - Resume previous diet.                           - Continue present medications.                           - Resume Plavix (clopidogrel) at prior dose today.                           - Repeat colonoscopy in 10 years for screening                            purposes. John Dickerson L. Loletha Carrow, MD 06/02/2019 10:32:46  AM This report has been signed electronically.

## 2019-06-04 ENCOUNTER — Telehealth: Payer: Self-pay

## 2019-06-04 ENCOUNTER — Telehealth: Payer: Self-pay | Admitting: *Deleted

## 2019-06-04 NOTE — Telephone Encounter (Signed)
No answer, unable to leave a message, B.Jatasia Gundrum RN. 

## 2019-06-04 NOTE — Telephone Encounter (Signed)
  Follow up Call-  Call back number 06/02/2019  Post procedure Call Back phone  # [phone]  Permission to leave phone message Yes  Some recent data might be hidden     Patient questions:  Do you have a fever, pain , or abdominal swelling? No. Pain Score  0 *  Have you tolerated food without any problems? Yes.    Have you been able to return to your normal activities? Yes.    Do you have any questions about your discharge instructions: Diet   No. Medications  No. Follow up visit  No.  Do you have questions or concerns about your Care? No.  Actions: * If pain score is 4 or above: No action needed, pain <4.

## 2019-06-05 ENCOUNTER — Other Ambulatory Visit: Payer: Self-pay

## 2019-06-05 ENCOUNTER — Ambulatory Visit (HOSPITAL_COMMUNITY)
Admission: RE | Admit: 2019-06-05 | Discharge: 2019-06-05 | Disposition: A | Discharge status: Home / Self Care | Payer: Medicare HMO | Source: Ambulatory Visit | Attending: Internal Medicine | Admitting: Internal Medicine

## 2019-06-05 DIAGNOSIS — M7918 Myalgia, other site: Secondary | ICD-10-CM | POA: Insufficient documentation

## 2019-06-06 ENCOUNTER — Other Ambulatory Visit: Payer: Self-pay | Admitting: *Deleted

## 2019-06-06 DIAGNOSIS — E1159 Type 2 diabetes mellitus with other circulatory complications: Secondary | ICD-10-CM

## 2019-06-06 MED ORDER — METFORMIN HCL 1000 MG PO TABS: ORAL_TABLET | ORAL | 0 refills | Status: DC

## 2019-06-09 ENCOUNTER — Other Ambulatory Visit: Payer: Self-pay | Admitting: Internal Medicine

## 2019-06-09 DIAGNOSIS — M7918 Myalgia, other site: Secondary | ICD-10-CM

## 2019-06-09 NOTE — Progress Notes (Signed)
Had the pleasure of speaking with John Dickerson regarding his MRI of his lumbar spine results below  IMPRESSION: 1. Epidural lipomatosis with severe spinal stenosis and complete CSF effacement from L3-S1. Disc bulging and a posterior disc protrusion contribute at L4-5. 2. Severe left neural foraminal stenosis at L4-5.  I made him aware that I would place a referral to orthopedic surgery for further evaluation.

## 2019-06-27 ENCOUNTER — Ambulatory Visit: Payer: Medicare HMO | Admitting: Infectious Diseases

## 2019-07-03 ENCOUNTER — Telehealth: Payer: Self-pay | Admitting: Internal Medicine

## 2019-07-03 NOTE — Telephone Encounter (Signed)
called pharm, it is a med from dr hatcher, they will call him

## 2019-07-03 NOTE — Telephone Encounter (Signed)
Pt received call from the pharmacy stating they are having a problem refilling ;pt contact [phone]

## 2019-07-10 ENCOUNTER — Telehealth: Payer: Self-pay

## 2019-07-10 NOTE — Telephone Encounter (Signed)
COVID-19 Pre-Screening Questions:07/10/19  Do you currently have a fever (>100 F), chills or unexplained body aches? NO  Are you currently experiencing new cough, shortness of breath, sore throat, runny nose? NO  Have you recently travelled outside the state of [place] in the last 14 days? NO  .  Have you been in contact with someone that is currently pending confirmation of Covid19 testing or has been confirmed to have the [place] virus?  NO  **If the patient answers NO to ALL questions -  advise the patient to please call the clinic before coming to the office should any symptoms develop.

## 2019-07-11 ENCOUNTER — Ambulatory Visit (INDEPENDENT_AMBULATORY_CARE_PROVIDER_SITE_OTHER): Payer: Medicare HMO | Admitting: Infectious Diseases

## 2019-07-11 ENCOUNTER — Encounter: Payer: Self-pay | Admitting: Infectious Diseases

## 2019-07-11 ENCOUNTER — Telehealth: Payer: Self-pay | Admitting: Pharmacy Technician

## 2019-07-11 ENCOUNTER — Other Ambulatory Visit: Payer: Self-pay

## 2019-07-11 VITALS — BP 146/82 | HR 66 | Temp 98.0°F | Wt 193.0 lb

## 2019-07-11 DIAGNOSIS — Z23 Encounter for immunization: Secondary | ICD-10-CM | POA: Diagnosis not present

## 2019-07-11 DIAGNOSIS — I2510 Atherosclerotic heart disease of native coronary artery without angina pectoris: Secondary | ICD-10-CM

## 2019-07-11 DIAGNOSIS — B20 Human immunodeficiency virus [HIV] disease: Secondary | ICD-10-CM

## 2019-07-11 DIAGNOSIS — E1159 Type 2 diabetes mellitus with other circulatory complications: Secondary | ICD-10-CM

## 2019-07-11 DIAGNOSIS — Z113 Encounter for screening for infections with a predominantly sexual mode of transmission: Secondary | ICD-10-CM

## 2019-07-11 DIAGNOSIS — N521 Erectile dysfunction due to diseases classified elsewhere: Secondary | ICD-10-CM

## 2019-07-11 DIAGNOSIS — E782 Mixed hyperlipidemia: Secondary | ICD-10-CM

## 2019-07-11 DIAGNOSIS — I10 Essential (primary) hypertension: Secondary | ICD-10-CM | POA: Diagnosis not present

## 2019-07-11 NOTE — Addendum Note (Signed)
Addended by: Aundria Rud on: 07/11/2019 12:15 PM   Modules accepted: Orders

## 2019-07-11 NOTE — Assessment & Plan Note (Signed)
Has been doing well, had CV f/u last year.  CP free.

## 2019-07-11 NOTE — Progress Notes (Signed)
   Subjective:    Patient ID: John Dickerson, male    DOB: 01/28/1961, 59 y.o.   MRN: [id number]  HPI 59yo M with hx of HIV+ was Dx 1998, DM2,CAD (remote LAD stent 1990s, s/p DESx2 to LAD 11/2015), and HTN. PreviousHep C 1a. 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  He was on atripla til changed to [place] for Hep C rx.  He was started on zepatier/sovaldi/rib (plan 16 weeks) on 2-25-18after he took harvoni for short course. His ART was changed to [place]/Descoveyat that time. He was changed to biktarvy 11-2016. Repeat Hep C RNAnegative(7-17 and 3-18).  He has had f/u at [facility] for his DM. Sugars are "doing alright I guess". He has been going to the gym every day. Hasn't had as good of a diet. Has tried "celery clens".  Had colon 05-2019.   HIV 1 RNA Quant (copies/mL)  Date Value  05/09/2019 <20 DETECTED (A)  08/12/2018 <20 NOT DETECTED  11/21/2017 <20 NOT DETECTED   CD4 T Cell Abs (/uL)  Date Value  05/09/2019 634  08/12/2018 600  11/21/2017 640    Review of Systems  Constitutional: Negative for appetite change and unexpected weight change.  Respiratory: Negative for cough and shortness of breath.   Gastrointestinal: Negative for constipation and diarrhea.  Genitourinary: Negative for difficulty urinating.  Neurological: Negative for numbness.  Psychiatric/Behavioral: Negative for sleep disturbance.       Objective:   Physical Exam        Assessment & Plan:

## 2019-07-11 NOTE — Assessment & Plan Note (Signed)
Mildly elevated today Will f/u with his PCP and CV.

## 2019-07-11 NOTE — Telephone Encounter (Addendum)
[facility] Patient Advocate Encounter  Patient came to [place] today with concern on getting his medication. He said he was uninsured but I located that he currently has active [employer] coverage.  [id number] [id number] PCN: [id number]  Upon review, our pharmacy system showed it was filled 07/07/2019 at [facility]. When I called, [facility] said the copay was $9.20 and the patient was aware that it was ready. I shared this information with Timmothy Sours so she is aware to fill out for SPAP instead of UMAP as the patient originally thought.   I attempted to get PAF in the interim but it is currently pending review and requires income documents. The patient is supposed to be bringing those in to the clinic on Monday.

## 2019-07-11 NOTE — Assessment & Plan Note (Signed)
Appreciate f/u of his PCP Will check his A1C today

## 2019-07-11 NOTE — Assessment & Plan Note (Signed)
On statin  Will check lipids and LFTs today

## 2019-07-11 NOTE — Assessment & Plan Note (Signed)
He is doing well Will check his labs today to start new year Offered/refused condoms.  PSV 23 today  no change in ART (biktarvy).  rtc in 9 months.

## 2019-07-15 ENCOUNTER — Encounter: Payer: Self-pay | Admitting: Infectious Diseases

## 2019-07-16 ENCOUNTER — Telehealth: Payer: Self-pay

## 2019-07-16 NOTE — Telephone Encounter (Signed)
[facility] Patient Advocate Encounter  Income document was dropped off today by the patient and has been uploaded to the PAF portal. I will monitor to see if they approve his coverage. This will cover the $9 copay at [facility] until SPAP is approved.

## 2019-07-16 NOTE — Telephone Encounter (Signed)
Patient SPAP is not approved as of yet. Application was sent today. Sent necessary information to [employer] to help with assistance.

## 2019-07-16 NOTE — Telephone Encounter (Signed)
Patient called office today stating he is having issues with getting medication. States that pharmacy is telling him he needs to pay a copay before he can get medication. Will forward message to Financial counselor to advise is SPAP is complete for patient. [place]

## 2019-07-18 ENCOUNTER — Other Ambulatory Visit: Payer: Self-pay | Admitting: *Deleted

## 2019-07-18 DIAGNOSIS — I10 Essential (primary) hypertension: Secondary | ICD-10-CM

## 2019-07-18 MED ORDER — METOPROLOL SUCCINATE ER 100 MG PO TB24: ORAL_TABLET | ORAL | 0 refills | Status: DC

## 2019-07-27 ENCOUNTER — Other Ambulatory Visit: Payer: Self-pay | Admitting: Internal Medicine

## 2019-08-29 ENCOUNTER — Ambulatory Visit (HOSPITAL_COMMUNITY)
Admission: EM | Admit: 2019-08-29 | Discharge: 2019-08-29 | Disposition: A | Discharge status: Home / Self Care | Payer: Medicare HMO | Attending: Family Medicine | Admitting: Family Medicine

## 2019-08-29 ENCOUNTER — Ambulatory Visit (INDEPENDENT_AMBULATORY_CARE_PROVIDER_SITE_OTHER): Payer: Medicare HMO

## 2019-08-29 ENCOUNTER — Other Ambulatory Visit: Payer: Self-pay

## 2019-08-29 ENCOUNTER — Encounter (HOSPITAL_COMMUNITY): Payer: Self-pay

## 2019-08-29 DIAGNOSIS — W228XXA Striking against or struck by other objects, initial encounter: Secondary | ICD-10-CM

## 2019-08-29 DIAGNOSIS — S92491A Other fracture of right great toe, initial encounter for closed fracture: Secondary | ICD-10-CM

## 2019-08-29 DIAGNOSIS — M79674 Pain in right toe(s): Secondary | ICD-10-CM

## 2019-08-29 MED ORDER — ACETAMINOPHEN 500 MG PO TABS: 1000.0000 mg | ORAL_TABLET | Freq: Four times a day (QID) | ORAL | 0 refills | Status: DC | PRN

## 2019-08-29 MED ORDER — HYDROCODONE-ACETAMINOPHEN 5-325 MG PO TABS: 1.0000 | ORAL_TABLET | ORAL | 0 refills | Status: AC | PRN

## 2019-08-29 NOTE — Discharge Instructions (Signed)
Take 1-2 Norco 5/Liken for moderate to severe pain -For mild to moderate pain take 2 extra strength Tylenol. -Ensure that if he had taken a dose of extra strength Tylenol it has been 6 hours since that dosing before he would take 2 of the Norco.  Follow-up with the orthopedic office supplied  Wear the cam walker boot at all times until you have seen orthopedics

## 2019-08-29 NOTE — ED Provider Notes (Signed)
[place]    CSN: [id number] Arrival date & time: 08/29/19  3810      History   Chief Complaint Chief Complaint  Patient presents with  . Toe Injury    HPI John Dickerson is a 59 y.o. male.   Patient with history of CAD, HIV, diabetes, gout presents with right great toe pain.  He reports pain in the toe on a piece of metal gym equipment 3 days ago.  He reports he was moving quickly through the gym and struck the toe in the front.  He was wearing tennis shoes.  He reports immediate pain throughout the toe.  He has noticed increased swelling and redness since then.  He reports the pain is gradually increased.  He reports has been difficult to sleep due to the pain.  He reports he has been able to walk and wear shoes but it has been somewhat painful. Denies fever and chills.   Patient has been compliant on his allopurinol and had no issues leading up to the event of the trauma. Has not had a gout flare in quite some time.     Past Medical History:  Diagnosis Date  . Anemia   . Anemia due to medication 09/24/2015  . Arthritis    "feet" (11/19/2015)  . CAD (coronary artery disease), native coronary artery    a. remote LAD stent 1990s. b. s/p DESx2 to LAD 11/2015  . Hepatitis A immune 11/20/2016  . Hepatitis B immune 11/20/2016  . Hepatitis C    "going thru the treatments" (11/19/2015)  . High cholesterol   . History of gout    "just recently" (11/19/2015)  . HIV (human immunodeficiency virus infection) ([place])   . Hypertension   . Posterior pain of left hip 03/01/2018  . Type II diabetes mellitus ([place])    diagnosed 04-2015    Patient Active Problem List   Diagnosis Date Noted  . Gluteal pain (left) 04/16/2019  . Contact dermatitis 01/08/2019  . Rash 10/29/2018  . Chronic hepatitis C without hepatic coma ([place]) 08/21/2018  . Cataracts, bilateral 05/21/2017  . S/P angioplasty with stent, 11/19/15 DES X 2 mLAD and dLAD, normal EF  11/20/2015  . Coronary artery disease  involving native coronary artery of native heart   . Exertional angina (HCC) 11/17/2015  . Gout 10/29/2015  . Type 2 diabetes mellitus with circulatory disorder causing erectile dysfunction ([place]) 05/11/2015  . Preventative health care 05/11/2015  . Hyperlipemia 05/11/2015  . Arthritis of shoulder region, left 03/24/2013  . Hypertriglyceridemia 03/24/2013  . ERECTILE DYSFUNCTION 09/12/2006  . Human immunodeficiency virus (HIV) disease ([place]) 08/09/2006  . Essential hypertension 08/09/2006    Past Surgical History:  Procedure Laterality Date  . CARDIAC CATHETERIZATION N/A 11/19/2015   Procedure: Left Heart Cath and Coronary Angiography;  Surgeon: Jolaine Artist, MD;  Location: [facility];  Service: Cardiovascular;  Laterality: N/A;  . CARDIAC CATHETERIZATION N/A 11/19/2015   Procedure: Coronary Stent Intervention;  Surgeon: Jettie Booze, MD;  Location: [facility];  Service: Cardiovascular;  Laterality: N/A;  . CORONARY ANGIOPLASTY WITH STENT PLACEMENT  11/19/2015   "2 stents"       Home Medications    Prior to Admission medications   Medication Sig Start Date End Date Taking? Authorizing Provider  acetaminophen (TYLENOL) 500 MG tablet Take 2 tablets (1,000 mg total) by mouth every 6 (six) hours as needed for moderate pain. 08/29/19   Iram Lundberg, Marguerita Beards, PA-C  allopurinol (ZYLOPRIM) 100 MG tablet TAKE 1 AND 1/2 TABLETS(150 MG) BY MOUTH DAILY 07/28/19   Agyei, Caprice Kluver, MD  amLODipine (NORVASC) 10 MG tablet TAKE 1 TABLET(10 MG) BY MOUTH DAILY 04/21/19   Jean Rosenthal, MD  aspirin EC 81 MG tablet Take 1 tablet (81 mg total) by mouth daily. 11/17/15   Bensimhon, Shaune Pascal, MD  atorvastatin (LIPITOR) 40 MG tablet Take 1 tablet (40 mg total) by mouth daily at 6 PM. 03/21/19   Jettie Booze, MD  BIKTARVY 50-200-25 MG TABS tablet TAKE 1 TABLET BY MOUTH DAILY 03/14/19   Campbell Riches, MD  blood glucose meter kit and supplies KIT Dispense based on patient and insurance  preference. Use up to four times daily as directed. (FOR ICD-9 250.00, 250.01). Patient not taking: Reported on 07/11/2019 08/03/15   Bethena Roys, MD  Blood Glucose Monitoring Suppl ([place]) w/Device KIT 1 each by Does not apply route daily. Patient not taking: Reported on 07/11/2019 12/22/15   Collier Salina, MD  clopidogrel (PLAVIX) 75 MG tablet Take 1 tablet (75 mg total) by mouth daily. 05/30/19   Jean Rosenthal, MD  Continuous Blood Gluc Receiver (FREESTYLE LIBRE 2 READER SYSTM) DEVI 1 Device by Does not apply route 3 (three) times daily. Patient not taking: Reported on 07/11/2019 01/08/19   Jean Rosenthal, MD  Continuous Blood Gluc Sensor (FREESTYLE LIBRE 2 SENSOR SYSTM) MISC 1 Device by Does not apply route 3 (three) times daily. Patient not taking: Reported on 07/11/2019 01/08/19   Jean Rosenthal, MD  glucose blood (ONETOUCH VERIO) test strip Check blood sugar up to 1 time a day Patient not taking: Reported on 07/11/2019 12/22/15   Collier Salina, MD  HYDROcodone-acetaminophen (NORCO/VICODIN) 5-325 MG tablet Take 1-2 tablets by mouth every 4 (four) hours as needed for up to 5 days for moderate pain or severe pain. 08/29/19 09/03/19  Arcangel Minion, Marguerita Beards, PA-C  losartan (COZAAR) 100 MG tablet TAKE 1 TABLET(100 MG) BY MOUTH DAILY 04/30/19   Jettie Booze, MD  metFORMIN (GLUCOPHAGE) 1000 MG tablet TAKE 1 TABLET BY MOUTH TWICE DAILY WITH A MEAL 06/06/19   Jean Rosenthal, MD  metoprolol succinate (TOPROL-XL) 100 MG 24 hr tablet Take with or immediately following a meal. 07/18/19   Agyei, Caprice Kluver, MD  nitroGLYCERIN (NITROSTAT) 0.4 MG SL tablet Place 1 tablet (0.4 mg total) under the tongue every 5 (five) minutes as needed for chest pain. 11/17/15   Bensimhon, Shaune Pascal, MD  [facility] DELICA LANCETS FINE MISC Check blood sugar up to 1 time a day Patient not taking: Reported on 07/11/2019 12/22/15   Collier Salina, MD  spironolactone (ALDACTONE) 25 MG tablet TAKE 1 TABLET(25 MG)  BY MOUTH DAILY. 03/12/19   Jettie Booze, MD    Family History Family History  Problem Relation Age of Onset  . Diabetes Mother   . Cancer Mother        unkown type  . Hypertension Father   . Colon cancer Neg Hx     Social History Social History   Tobacco Use  . Smoking status: Former Smoker    Packs/day: 0.12    Years: 49.00    Pack years: 5.88    Types: Cigarettes    Start date: 07/20/2012    Quit date: 02/18/2015    Years since quitting: 4.5  . Smokeless tobacco: Never Used  Substance Use Topics  . Alcohol use: No  Alcohol/week: 0.0 standard drinks  . Drug use: No     Allergies   Patient has no known allergies.   Review of Systems Review of Systems  Constitutional: Negative for chills and fever.  Musculoskeletal: Positive for joint swelling.       Toe pain     Physical Exam Triage Vital Signs ED Triage Vitals  Enc Vitals Group     BP 08/29/19 0840 (!) 154/78     Pulse Rate 08/29/19 0840 76     Resp 08/29/19 0840 17     Temp 08/29/19 0840 98.7 F (37.1 C)     Temp Source 08/29/19 0840 Oral     SpO2 08/29/19 0840 100 %     Weight --      Height --      Head Circumference --      Peak Flow --      Pain Score 08/29/19 0838 9     Pain Loc --      Pain Edu? --      Excl. in [place]? --    No data found.  Updated Vital Signs BP (!) 154/78 (BP Location: Right Arm)   Pulse 76   Temp 98.7 F (37.1 C) (Oral)   Resp 17   SpO2 100%   Visual Acuity Right Eye Distance:   Left Eye Distance:   Bilateral Distance:    Right Eye Near:   Left Eye Near:    Bilateral Near:     Physical Exam Vitals and nursing note reviewed.  Constitutional:      General: He is not in acute distress.    Appearance: Normal appearance. He is well-developed. He is not ill-appearing.  HENT:     Head: Normocephalic and atraumatic.  Cardiovascular:     Rate and Rhythm: Normal rate.  Pulmonary:     Effort: Pulmonary effort is normal. No respiratory distress.    Musculoskeletal:     Cervical back: Neck supple.     Comments: Right great toe with erythema and swelling with tenderness to palpation. Tenderness is greater over the distal inner phalangeal joint compared to the proximal MTP joint.  Skin:    General: Skin is warm and dry.  Neurological:     Mental Status: He is alert.      UC Treatments / Results  Labs (all labs ordered are listed, but only abnormal results are displayed) Labs Reviewed - No data to display  EKG   Radiology DG Toe Great Right  Result Date: 08/29/2019 CLINICAL DATA:  Toe injury. EXAM: RIGHT GREAT TOE COMPARISON:  Right foot study from 10/29/2015 FINDINGS: Comminuted fracture identified in the distal aspect of the great toe proximal phalanx. Intra-articular extension is identified at the IP joint. Hallux valgus deformity evident with degenerative changes at the MTP joint. IMPRESSION: Comminuted fracture involving the head of the great toe proximal phalanx with intra-articular extension. Electronically Signed   By: Misty Stanley M.D.   On: 08/29/2019 09:17    Procedures Procedures (including critical care time)  Medications Ordered in UC Medications - No data to display  Initial Impression / Assessment and Plan / UC Course  I have reviewed the triage vital signs and the nursing notes.  Pertinent labs & imaging results that were available during my care of the patient were reviewed by me and considered in my medical decision making (see chart for details).     #Right great toe fracture Patient is a 59 year old male presenting with acute injury  to his right great toe. X-ray read of comminuted with intra-articular involvement of the distal phalanx of the right great toe. Patient placed in cam walker and given pain medication prescription. Instructed to follow-up with orthopedics for further evaluation. -Patient verbally advised to not drive while using pain medication. -Patient verbalized understanding and will  follow up with orthopedics.  Final Clinical Impressions(s) / UC Diagnoses   Final diagnoses:  Other fracture of right great toe, initial encounter for closed fracture     Discharge Instructions     Take 1-2 Norco 5/Liken for moderate to severe pain -For mild to moderate pain take 2 extra strength Tylenol. -Ensure that if he had taken a dose of extra strength Tylenol it has been 6 hours since that dosing before he would take 2 of the Norco.  Follow-up with the orthopedic office supplied  Wear the cam walker boot at all times until you have seen orthopedics      ED Prescriptions    Medication Sig Dispense Auth. Provider   HYDROcodone-acetaminophen (NORCO/VICODIN) 5-325 MG tablet Take 1-2 tablets by mouth every 4 (four) hours as needed for up to 5 days for moderate pain or severe pain. 10 tablet Gerarda Conklin, Marguerita Beards, PA-C   acetaminophen (TYLENOL) 500 MG tablet Take 2 tablets (1,000 mg total) by mouth every 6 (six) hours as needed for moderate pain. 30 tablet Jahnia Hewes, Marguerita Beards, PA-C     I have reviewed the PDMP during this encounter.   Purnell Shoemaker, PA-C 08/29/19 [phone]

## 2019-08-29 NOTE — ED Triage Notes (Signed)
Pt presents with right big toe injury after stubbing it on a piece of metal at the gym on Tuesday.

## 2019-09-04 ENCOUNTER — Other Ambulatory Visit: Payer: Self-pay

## 2019-09-04 DIAGNOSIS — E1159 Type 2 diabetes mellitus with other circulatory complications: Secondary | ICD-10-CM

## 2019-09-04 DIAGNOSIS — N521 Erectile dysfunction due to diseases classified elsewhere: Secondary | ICD-10-CM

## 2019-09-04 MED ORDER — METFORMIN HCL 1000 MG PO TABS: ORAL_TABLET | ORAL | 0 refills | Status: DC

## 2019-09-08 ENCOUNTER — Other Ambulatory Visit: Payer: Self-pay | Admitting: *Deleted

## 2019-09-08 MED ORDER — CLOPIDOGREL BISULFATE 75 MG PO TABS: 75.0000 mg | ORAL_TABLET | Freq: Every day | ORAL | 0 refills | Status: DC

## 2019-10-19 ENCOUNTER — Other Ambulatory Visit: Payer: Self-pay | Admitting: Internal Medicine

## 2019-10-19 DIAGNOSIS — I10 Essential (primary) hypertension: Secondary | ICD-10-CM

## 2019-10-22 NOTE — Telephone Encounter (Signed)
This patient has an appt with his PCP on 12/15/2019.

## 2019-11-03 ENCOUNTER — Other Ambulatory Visit: Payer: Self-pay

## 2019-11-03 DIAGNOSIS — I10 Essential (primary) hypertension: Secondary | ICD-10-CM

## 2019-11-03 MED ORDER — AMLODIPINE BESYLATE 10 MG PO TABS: 10.0000 mg | ORAL_TABLET | Freq: Every day | ORAL | 3 refills | Status: AC

## 2019-11-29 ENCOUNTER — Other Ambulatory Visit: Payer: Self-pay | Admitting: Infectious Diseases

## 2019-11-29 DIAGNOSIS — B20 Human immunodeficiency virus [HIV] disease: Secondary | ICD-10-CM

## 2019-12-01 ENCOUNTER — Other Ambulatory Visit: Payer: Self-pay | Admitting: *Deleted

## 2019-12-01 DIAGNOSIS — E1159 Type 2 diabetes mellitus with other circulatory complications: Secondary | ICD-10-CM

## 2019-12-01 MED ORDER — METFORMIN HCL 1000 MG PO TABS: ORAL_TABLET | ORAL | 1 refills | Status: DC

## 2019-12-04 ENCOUNTER — Other Ambulatory Visit: Payer: Self-pay | Admitting: *Deleted

## 2019-12-04 MED ORDER — CLOPIDOGREL BISULFATE 75 MG PO TABS: 75.0000 mg | ORAL_TABLET | Freq: Every day | ORAL | 1 refills | Status: DC

## 2019-12-15 ENCOUNTER — Encounter: Payer: Medicare HMO | Admitting: Internal Medicine

## 2020-01-02 ENCOUNTER — Ambulatory Visit: Payer: Medicare HMO

## 2020-01-02 ENCOUNTER — Other Ambulatory Visit: Payer: Self-pay

## 2020-01-05 ENCOUNTER — Encounter: Payer: Self-pay | Admitting: Infectious Diseases

## 2020-02-18 ENCOUNTER — Encounter: Payer: Medicare HMO | Admitting: Internal Medicine

## 2020-02-19 ENCOUNTER — Other Ambulatory Visit: Payer: Self-pay

## 2020-02-19 ENCOUNTER — Encounter (HOSPITAL_COMMUNITY): Payer: Self-pay

## 2020-02-19 ENCOUNTER — Ambulatory Visit (INDEPENDENT_AMBULATORY_CARE_PROVIDER_SITE_OTHER): Payer: Medicare HMO

## 2020-02-19 ENCOUNTER — Ambulatory Visit (HOSPITAL_COMMUNITY)
Admission: EM | Admit: 2020-02-19 | Discharge: 2020-02-19 | Disposition: A | Discharge status: Home / Self Care | Payer: Medicare HMO | Attending: Family Medicine | Admitting: Family Medicine

## 2020-02-19 DIAGNOSIS — R1011 Right upper quadrant pain: Secondary | ICD-10-CM | POA: Diagnosis present

## 2020-02-19 DIAGNOSIS — R109 Unspecified abdominal pain: Secondary | ICD-10-CM

## 2020-02-19 LAB — CBC WITH DIFFERENTIAL/PLATELET
Abs Immature Granulocytes: 0.04 10*3/uL (ref 0.00–0.07)
Basophils Absolute: 0 10*3/uL (ref 0.0–0.1)
Basophils Relative: 0 %
Eosinophils Absolute: 0.1 10*3/uL (ref 0.0–0.5)
Eosinophils Relative: 1 %
HCT: 37.7 % — ABNORMAL LOW (ref 39.0–52.0)
Hemoglobin: 13.2 g/dL (ref 13.0–17.0)
Immature Granulocytes: 0 %
Lymphocytes Relative: 21 %
Lymphs Abs: 2 10*3/uL (ref 0.7–4.0)
MCH: 32.8 pg (ref 26.0–34.0)
MCHC: 35 g/dL (ref 30.0–36.0)
MCV: 93.8 fL (ref 80.0–100.0)
Monocytes Absolute: 1 10*3/uL (ref 0.1–1.0)
Monocytes Relative: 10 %
Neutro Abs: 6.7 10*3/uL (ref 1.7–7.7)
Neutrophils Relative %: 68 %
Platelets: 419 10*3/uL — ABNORMAL HIGH (ref 150–400)
RBC: 4.02 MIL/uL — ABNORMAL LOW (ref 4.22–5.81)
RDW: 12.5 % (ref 11.5–15.5)
WBC: 9.9 10*3/uL (ref 4.0–10.5)
nRBC: 0 % (ref 0.0–0.2)

## 2020-02-19 LAB — COMPREHENSIVE METABOLIC PANEL
ALT: 20 U/L (ref 0–44)
AST: 19 U/L (ref 15–41)
Albumin: 3.9 g/dL (ref 3.5–5.0)
Alkaline Phosphatase: 78 U/L (ref 38–126)
Anion gap: 11 (ref 5–15)
BUN: 24 mg/dL — ABNORMAL HIGH (ref 8–23)
CO2: 25 mmol/L (ref 22–32)
Calcium: 10.2 mg/dL (ref 8.9–10.3)
Chloride: 101 mmol/L (ref 98–111)
Creatinine, Ser: 1.45 mg/dL — ABNORMAL HIGH (ref 0.61–1.24)
GFR calc Af Amer: 60 mL/min — ABNORMAL LOW (ref >60)
GFR calc non Af Amer: 52 mL/min — ABNORMAL LOW (ref >60)
Glucose, Bld: 125 mg/dL — ABNORMAL HIGH (ref 70–99)
Potassium: 4.9 mmol/L (ref 3.5–5.1)
Sodium: 137 mmol/L (ref 135–145)
Total Bilirubin: 0.7 mg/dL (ref 0.3–1.2)
Total Protein: 7.8 g/dL (ref 6.5–8.1)

## 2020-02-19 LAB — LIPASE, BLOOD: Lipase: 38 U/L (ref 11–51)

## 2020-02-19 LAB — AMYLASE: Amylase: 196 U/L — ABNORMAL HIGH (ref 28–100)

## 2020-02-19 NOTE — Discharge Instructions (Addendum)
Based on exam there is some concern for gallbladder issue or gallstones.  Do not think you need to go straight to the ER right now.  Recommend follow-up with your primary for possible ultrasound.  In the meantime you can try a low-fat diet and try doing some MiraLAX to move your bowels. Drink plenty of water If your pain worsens and you start having nausea or vomiting you will need to go to the ER. I will call you with any abnormal lab work

## 2020-02-19 NOTE — ED Triage Notes (Signed)
Pt presents with complaints of right upper abdominal pain x 1 week. Denies any other symptoms. Denies taking any otc medications. Reports initially thinking it was from working out. Reports the pain has continued and is worrisome. RUQ is tender to touch.

## 2020-02-21 NOTE — ED Provider Notes (Signed)
[place]    CSN: [id number] Arrival date & time: 02/19/20  0807      History   Chief Complaint Chief Complaint  Patient presents with  . Abdominal Pain    HPI John Dickerson is a 59 y.o. male.   Patient is a 1-year male who presents now with approximate 1 week of right upper quadrant pain.  This is been constant, waxing waning.  Has not taken any for symptoms.  Denies any associated nausea, vomiting, diarrhea or fevers.  Does suffer from constipation from time to time.     Past Medical History:  Diagnosis Date  . Anemia   . Anemia due to medication 09/24/2015  . Arthritis    "feet" (11/19/2015)  . CAD (coronary artery disease), native coronary artery    a. remote LAD stent 1990s. b. s/p DESx2 to LAD 11/2015  . Hepatitis A immune 11/20/2016  . Hepatitis B immune 11/20/2016  . Hepatitis C    "going thru the treatments" (11/19/2015)  . High cholesterol   . History of gout    "just recently" (11/19/2015)  . HIV (human immunodeficiency virus infection) ([place])   . Hypertension   . Posterior pain of left hip 03/01/2018  . Type II diabetes mellitus ([place])    diagnosed 04-2015    Patient Active Problem List   Diagnosis Date Noted  . Gluteal pain (left) 04/16/2019  . Contact dermatitis 01/08/2019  . Rash 10/29/2018  . Chronic hepatitis C without hepatic coma ([place]) 08/21/2018  . Cataracts, bilateral 05/21/2017  . S/P angioplasty with stent, 11/19/15 DES X 2 mLAD and dLAD, normal EF  11/20/2015  . Coronary artery disease involving native coronary artery of native heart   . Exertional angina (HCC) 11/17/2015  . Gout 10/29/2015  . Type 2 diabetes mellitus with circulatory disorder causing erectile dysfunction ([place]) 05/11/2015  . Preventative health care 05/11/2015  . Hyperlipemia 05/11/2015  . Arthritis of shoulder region, left 03/24/2013  . Hypertriglyceridemia 03/24/2013  . ERECTILE DYSFUNCTION 09/12/2006  . Human immunodeficiency virus (HIV) disease ([place])  08/09/2006  . Essential hypertension 08/09/2006    Past Surgical History:  Procedure Laterality Date  . CARDIAC CATHETERIZATION N/A 11/19/2015   Procedure: Left Heart Cath and Coronary Angiography;  Surgeon: Jolaine Artist, MD;  Location: [facility];  Service: Cardiovascular;  Laterality: N/A;  . CARDIAC CATHETERIZATION N/A 11/19/2015   Procedure: Coronary Stent Intervention;  Surgeon: Jettie Booze, MD;  Location: [facility];  Service: Cardiovascular;  Laterality: N/A;  . CORONARY ANGIOPLASTY WITH STENT PLACEMENT  11/19/2015   "2 stents"       Home Medications    Prior to Admission medications   Medication Sig Start Date End Date Taking? Authorizing Provider  acetaminophen (TYLENOL) 500 MG tablet Take 2 tablets (1,000 mg total) by mouth every 6 (six) hours as needed for moderate pain. 08/29/19   Darr, Marguerita Beards, PA-C  allopurinol (ZYLOPRIM) 100 MG tablet TAKE 1 AND 1/2 TABLETS(150 MG) BY MOUTH DAILY 07/28/19   Jean Rosenthal, MD  amLODipine (NORVASC) 10 MG tablet Take 1 tablet (10 mg total) by mouth daily. 11/03/19   Axel Filler, MD  aspirin EC 81 MG tablet Take 1 tablet (81 mg total) by mouth daily. 11/17/15   Bensimhon, Shaune Pascal, MD  atorvastatin (LIPITOR) 40 MG tablet Take 1 tablet (40 mg total) by mouth daily at 6 PM. 03/21/19   Jettie Booze, MD  BIKTARVY 50-200-25 MG TABS tablet  TAKE 1 TABLET BY MOUTH DAILY 12/01/19   Campbell Riches, MD  blood glucose meter kit and supplies KIT Dispense based on patient and insurance preference. Use up to four times daily as directed. (FOR ICD-9 250.00, 250.01). Patient not taking: Reported on 07/11/2019 08/03/15   Bethena Roys, MD  Blood Glucose Monitoring Suppl ([place]) w/Device KIT 1 each by Does not apply route daily. Patient not taking: Reported on 07/11/2019 12/22/15   Collier Salina, MD  clopidogrel (PLAVIX) 75 MG tablet Take 1 tablet (75 mg total) by mouth daily. 12/04/19    Jean Rosenthal, MD  Continuous Blood Gluc Receiver (FREESTYLE LIBRE 2 READER SYSTM) DEVI 1 Device by Does not apply route 3 (three) times daily. Patient not taking: Reported on 07/11/2019 01/08/19   Jean Rosenthal, MD  Continuous Blood Gluc Sensor (FREESTYLE LIBRE 2 SENSOR SYSTM) MISC 1 Device by Does not apply route 3 (three) times daily. Patient not taking: Reported on 07/11/2019 01/08/19   Jean Rosenthal, MD  glucose blood (ONETOUCH VERIO) test strip Check blood sugar up to 1 time a day Patient not taking: Reported on 07/11/2019 12/22/15   Collier Salina, MD  losartan (COZAAR) 100 MG tablet TAKE 1 TABLET(100 MG) BY MOUTH DAILY 04/30/19   Jettie Booze, MD  metFORMIN (GLUCOPHAGE) 1000 MG tablet TAKE 1 TABLET BY MOUTH TWICE DAILY WITH A MEAL 12/01/19   Agyei, Caprice Kluver, MD  metoprolol succinate (TOPROL-XL) 100 MG 24 hr tablet TAKE 1 TABLET BY MOUTH DAILY WITH OR IMMEDAITELY FOLLOWING A MEAL 10/21/19   Agyei, Caprice Kluver, MD  nitroGLYCERIN (NITROSTAT) 0.4 MG SL tablet Place 1 tablet (0.4 mg total) under the tongue every 5 (five) minutes as needed for chest pain. 11/17/15   Bensimhon, Shaune Pascal, MD  [facility] DELICA LANCETS FINE MISC Check blood sugar up to 1 time a day Patient not taking: Reported on 07/11/2019 12/22/15   Collier Salina, MD  spironolactone (ALDACTONE) 25 MG tablet TAKE 1 TABLET(25 MG) BY MOUTH DAILY. 03/12/19   Jettie Booze, MD    Family History Family History  Problem Relation Age of Onset  . Diabetes Mother   . Cancer Mother        unkown type  . Hypertension Father   . Colon cancer Neg Hx     Social History Social History   Tobacco Use  . Smoking status: Former Smoker    Packs/day: 0.12    Years: 49.00    Pack years: 5.88    Types: Cigarettes    Start date: 07/20/2012    Quit date: 02/18/2015    Years since quitting: 5.0  . Smokeless tobacco: Never Used  Vaping Use  . Vaping Use: Never used  Substance Use Topics  . Alcohol use: No    Alcohol/week: 0.0 standard  drinks  . Drug use: No     Allergies   Patient has no known allergies.   Review of Systems Review of Systems   Physical Exam Triage Vital Signs ED Triage Vitals  Enc Vitals Group     BP 02/19/20 0855 (!) 142/72     Pulse Rate 02/19/20 0855 77     Resp 02/19/20 0855 18     Temp 02/19/20 0855 98.4 F (36.9 C)     Temp src --      SpO2 02/19/20 0855 98 %     Weight --      Height --  Head Circumference --      Peak Flow --      Pain Score 02/19/20 0854 8     Pain Loc --      Pain Edu? --      Excl. in [place]? --    No data found.  Updated Vital Signs BP (!) 142/72   Pulse 77   Temp 98.4 F (36.9 C)   Resp 18   SpO2 98%   Visual Acuity Right Eye Distance:   Left Eye Distance:   Bilateral Distance:    Right Eye Near:   Left Eye Near:    Bilateral Near:     Physical Exam Vitals and nursing note reviewed.  Constitutional:      Appearance: Normal appearance.  HENT:     Head: Normocephalic and atraumatic.     Nose: Nose normal.  Eyes:     Conjunctiva/sclera: Conjunctivae normal.  Cardiovascular:     Rate and Rhythm: Normal rate and regular rhythm.  Pulmonary:     Effort: Pulmonary effort is normal.     Breath sounds: Normal breath sounds.  Abdominal:     General: Bowel sounds are normal.     Palpations: Abdomen is soft.     Tenderness: There is abdominal tenderness in the right upper quadrant. Positive signs include Murphy's sign.  Musculoskeletal:        General: Normal range of motion.     Cervical back: Normal range of motion.  Skin:    General: Skin is warm and dry.  Neurological:     Mental Status: He is alert.  Psychiatric:        Mood and Affect: Mood normal.      UC Treatments / Results  Labs (all labs ordered are listed, but only abnormal results are displayed) Labs Reviewed  CBC WITH DIFFERENTIAL/PLATELET - Abnormal; Notable for the following components:      Result Value   RBC 4.02 (*)    HCT 37.7 (*)    Platelets 419 (*)      All other components within normal limits  COMPREHENSIVE METABOLIC PANEL - Abnormal; Notable for the following components:   Glucose, Bld 125 (*)    BUN 24 (*)    Creatinine, Ser 1.45 (*)    GFR calc non Af Amer 52 (*)    GFR calc Af Amer 60 (*)    All other components within normal limits  AMYLASE - Abnormal; Notable for the following components:   Amylase 196 (*)    All other components within normal limits  LIPASE, BLOOD    EKG   Radiology No results found.  Procedures Procedures (including critical care time)  Medications Ordered in UC Medications - No data to display  Initial Impression / Assessment and Plan / UC Course  I have reviewed the triage vital signs and the nursing notes.  Pertinent labs & imaging results that were available during my care of the patient were reviewed by me and considered in my medical decision making (see chart for details).     Right upper quadrant abdominal pain Based on exam and lab work concern for gallbladder issue/gallstones Otherwise he is not on ill-appearing or nontoxic.  Vital signs are stable.  He is not having any active vomiting Amylase 196 Recommended follow-up with his primary care for ultrasound.  If his symptoms worsen he will need to go to the ER.' MiraLAX for constipation Push fluids Follow up as needed for continued or worsening symptoms  Final Clinical Impressions(s) / UC Diagnoses   Final diagnoses:  Right upper quadrant abdominal pain     Discharge Instructions     Based on exam there is some concern for gallbladder issue or gallstones.  Do not think you need to go straight to the ER right now.  Recommend follow-up with your primary for possible ultrasound.  In the meantime you can try a low-fat diet and try doing some MiraLAX to move your bowels. Drink plenty of water If your pain worsens and you start having nausea or vomiting you will need to go to the ER. I will call you with any abnormal lab  work    ED Prescriptions    None     PDMP not reviewed this encounter.   Loura Halt A, NP 02/21/20 1201

## 2020-02-25 ENCOUNTER — Other Ambulatory Visit: Payer: Self-pay | Admitting: Interventional Cardiology

## 2020-02-25 MED ORDER — SPIRONOLACTONE 25 MG PO TABS
25.0000 mg | ORAL_TABLET | Freq: Every day | ORAL | 0 refills | Status: DC
Start: 2020-02-25 — End: 2020-03-24

## 2020-02-25 NOTE — Addendum Note (Signed)
Addended by: Jeremy Johann on: 02/25/2020 10:54 AM   Modules accepted: Orders

## 2020-03-03 ENCOUNTER — Ambulatory Visit (INDEPENDENT_AMBULATORY_CARE_PROVIDER_SITE_OTHER): Payer: Medicare HMO | Admitting: Internal Medicine

## 2020-03-03 ENCOUNTER — Encounter: Payer: Self-pay | Admitting: Internal Medicine

## 2020-03-03 ENCOUNTER — Ambulatory Visit (HOSPITAL_COMMUNITY)
Admission: RE | Admit: 2020-03-03 | Discharge: 2020-03-03 | Disposition: A | Discharge status: Home / Self Care | Payer: Medicare HMO | Source: Ambulatory Visit | Attending: Internal Medicine | Admitting: Internal Medicine

## 2020-03-03 ENCOUNTER — Other Ambulatory Visit: Payer: Self-pay

## 2020-03-03 VITALS — BP 120/71 | HR 80 | Temp 98.5°F | Ht 63.0 in | Wt 177.2 lb

## 2020-03-03 DIAGNOSIS — N521 Erectile dysfunction due to diseases classified elsewhere: Secondary | ICD-10-CM

## 2020-03-03 DIAGNOSIS — E1159 Type 2 diabetes mellitus with other circulatory complications: Secondary | ICD-10-CM

## 2020-03-03 DIAGNOSIS — K819 Cholecystitis, unspecified: Secondary | ICD-10-CM | POA: Insufficient documentation

## 2020-03-03 DIAGNOSIS — D72829 Elevated white blood cell count, unspecified: Secondary | ICD-10-CM

## 2020-03-03 DIAGNOSIS — Z23 Encounter for immunization: Secondary | ICD-10-CM | POA: Diagnosis not present

## 2020-03-03 DIAGNOSIS — D473 Essential (hemorrhagic) thrombocythemia: Secondary | ICD-10-CM

## 2020-03-03 DIAGNOSIS — R1011 Right upper quadrant pain: Secondary | ICD-10-CM

## 2020-03-03 LAB — POCT GLYCOSYLATED HEMOGLOBIN (HGB A1C): Hemoglobin A1C: 6.4 % — AB (ref 4.0–5.6)

## 2020-03-03 LAB — GLUCOSE, CAPILLARY: Glucose-Capillary: 111 mg/dL — ABNORMAL HIGH (ref 70–99)

## 2020-03-03 NOTE — Progress Notes (Signed)
   CC: Right upper quadrant pain  HPI:  [name] is a 59 y.o. with medical history significant for chronic treated hepatitis C, HIV, hypertension, type 2 diabetes mellitus presenting with complaint of right upper quadrant pain.  Please see problem based charting for further details.  Past Medical History:  Diagnosis Date  . Anemia   . Anemia due to medication 09/24/2015  . Arthritis    "feet" (11/19/2015)  . CAD (coronary artery disease), native coronary artery    a. remote LAD stent 1990s. b. s/p DESx2 to LAD 11/2015  . Hepatitis A immune 11/20/2016  . Hepatitis B immune 11/20/2016  . Hepatitis C    "going thru the treatments" (11/19/2015)  . High cholesterol   . History of gout    "just recently" (11/19/2015)  . HIV (human immunodeficiency virus infection) ([place])   . Hypertension   . Posterior pain of left hip 03/01/2018  . Type II diabetes mellitus ([place])    diagnosed 04-2015   Review of Systems:  As per HPI  Physical Exam:  Vitals:   03/03/20 0848 03/03/20 0853  BP:  120/71  Pulse:  80  Temp:  98.5 F (36.9 C)  TempSrc:  Oral  SpO2:  100%  Weight: 177 lb 3.2 oz (80.4 kg)   Height: 5\' 3"  (1.6 m)    Physical Exam Eyes:     General: No scleral icterus. Cardiovascular:     Rate and Rhythm: Normal rate.  Abdominal:     General: Distension: Due to body habitus.     Palpations: There is no mass.     Tenderness: There is abdominal tenderness (RUQ). There is no guarding or rebound.     Hernia: No hernia is present.  Skin:    Coloration: Skin is not jaundiced or pale.     Findings: No bruising, erythema, lesion or rash.  Neurological:     Mental Status: He is alert.  Psychiatric:        Mood and Affect: Mood normal.        Behavior: Behavior normal.     Assessment & Plan:   See Encounters Tab for problem based charting.  Patient discussed with Dr. Rebeca Alert

## 2020-03-03 NOTE — Progress Notes (Signed)
Internal Medicine Clinic Attending  Case discussed with Dr. Agyei at the time of the visit.  We reviewed the resident's history and exam and pertinent patient test results.  I agree with the assessment, diagnosis, and plan of care documented in the resident's note.  Emarion Toral, M.D., Ph.D.  

## 2020-03-03 NOTE — Assessment & Plan Note (Addendum)
Right upper quadrant pain: John Dickerson reports that he was in his usual state of health until 3 weeks ago when he began experiencing constant right upper quadrant pain.  He rates the pain at 8/10 when it is worse.  He states that the pain is exacerbated when he lies on his right side and also on his abdomen.  Other times, the pain is worsened with diet but not always.  He denies any systemic symptoms such as fevers, chills, nausea, vomiting, diarrhea, change in the color of the stool, rashes or radiation of his pain.  He denies any NSAIDs use with exception of his low-dose aspirin.  He was evaluated at the urgent care on February 19, 2020 and his CMP reported elevated amylase however lipase was unremarkable.  CBC was also unremarkable as well.  An abdominal x-ray was performed which was normal as well.  He was instructed to follow-up with me.  He did have an abdominal ultrasound performed in 2018 which showed hepatocellular changes consistent with fatty liver or hepatocellular disease.  Assessment: Musculoskeletal versus gastritis versus peptic ulcer disease versus other hepatobiliary insult.  Plan: -Follow-up CMP, CBC, amylase, lipase, right upper quadrant ultrasound -Advised for him to try lidocaine patches for now and given strict return precautions.  ADDENDUM: I thoroughly reviewed John Dickerson's labs.  CMP showed hyperkalemia 5.5, serum creatinine has improved.  Alkaline phosphatase mildly elevated at 125, serum amylase is improving at 144.  CBC shows leukocytosis of 12, hemoglobin of 12.4, hematocrit of 36.4, thrombocytosis with platelet of 516 from 419.  His abdominal ultrasound did not reveal acute cholecystitis.  In regards to his abnormal laboratory findings, he did not appear to have any signs of infection during my encounter.  In regards to his leukocytosis, this could be reactive from dehydration, iron deficiency versus bone marrow proliferative process given that he has leukocytosis  with thrombocytosis.  I would repeat CBC with differential.  If it shows consistent elevated leukocytosis with thrombocytosis, I would obtain JAK2 lab with blood smear.    ADDENDUM: Labs show persistently elevated leukocytosis that is actually increasing to 14.1 with neutrophil predominance.  Thrombocytosis of 545.  I will call John Dickerson and place a STAT order for CT abdomen and pelvis. If unrevealing, will order MRCP  John Dickerson reports that his pain is still present.  I have advised him to go to the emergency department if his symptoms worsens or he begin to experience fevers, chills, nausea, vomiting or feeling unwell.  ADDENDUM:  CT Abdomen and Pelvis: IMPRESSION: 1. Constellation of findings are consistent with acute cholecystitis. No definitive radiopaque choledocholithiasis Visualized.  Advised John Dickerson to go to the ED

## 2020-03-03 NOTE — Patient Instructions (Signed)
Mr. Bissonette,   I am going to order an abdominal ultrasound and labs. For the meantime, you can try lidocaine patches. If you start to feel worse, experience, fevers, chills, nausea, vomiting, please call us or go to the emergency department.   Take care! Dr. Eileen Stanford  Please call the internal medicine center clinic if you have any questions or concerns, we may be able to help and keep you from a long and expensive emergency room wait. Our clinic and after hours phone number is [phone], the best time to call is Monday through Friday 9 am to 4 pm but there is always someone available 24/7 if you have an emergency. If you need medication refills please notify your pharmacy one week in advance and they will send [place] a request.

## 2020-03-03 NOTE — Progress Notes (Signed)
6.4

## 2020-03-04 ENCOUNTER — Other Ambulatory Visit: Payer: Self-pay | Admitting: Internal Medicine

## 2020-03-04 ENCOUNTER — Other Ambulatory Visit (INDEPENDENT_AMBULATORY_CARE_PROVIDER_SITE_OTHER): Payer: Medicare HMO

## 2020-03-04 ENCOUNTER — Other Ambulatory Visit: Payer: Self-pay

## 2020-03-04 DIAGNOSIS — R1011 Right upper quadrant pain: Secondary | ICD-10-CM

## 2020-03-04 LAB — CBC
Hematocrit: 36.4 % — ABNORMAL LOW (ref 37.5–51.0)
Hemoglobin: 12.4 g/dL — ABNORMAL LOW (ref 13.0–17.7)
MCH: 31.3 pg (ref 26.6–33.0)
MCHC: 34.1 g/dL (ref 31.5–35.7)
MCV: 92 fL (ref 79–97)
Platelets: 516 10*3/uL — ABNORMAL HIGH (ref 150–450)
RBC: 3.96 x10E6/uL — ABNORMAL LOW (ref 4.14–5.80)
RDW: 11.3 % — ABNORMAL LOW (ref 11.6–15.4)
WBC: 12 10*3/uL — ABNORMAL HIGH (ref 3.4–10.8)

## 2020-03-04 LAB — CMP14 + ANION GAP
ALT: 17 IU/L (ref 0–44)
AST: 17 IU/L (ref 0–40)
Albumin/Globulin Ratio: 1.2 (ref 1.2–2.2)
Albumin: 4.1 g/dL (ref 3.8–4.8)
Alkaline Phosphatase: 125 IU/L — ABNORMAL HIGH (ref 44–121)
Anion Gap: 16 mmol/L (ref 10.0–18.0)
BUN/Creatinine Ratio: 13 (ref 10–24)
BUN: 16 mg/dL (ref 8–27)
Bilirubin Total: 0.3 mg/dL (ref 0.0–1.2)
CO2: 21 mmol/L (ref 20–29)
Calcium: 10.1 mg/dL (ref 8.6–10.2)
Chloride: 99 mmol/L (ref 96–106)
Creatinine, Ser: 1.28 mg/dL — ABNORMAL HIGH (ref 0.76–1.27)
GFR calc Af Amer: 69 mL/min/{1.73_m2} (ref >59)
GFR calc non Af Amer: 60 mL/min/{1.73_m2} (ref >59)
Globulin, Total: 3.3 g/dL (ref 1.5–4.5)
Glucose: 122 mg/dL — ABNORMAL HIGH (ref 65–99)
Potassium: 5.5 mmol/L — ABNORMAL HIGH (ref 3.5–5.2)
Sodium: 136 mmol/L (ref 134–144)
Total Protein: 7.4 g/dL (ref 6.0–8.5)

## 2020-03-04 LAB — LIPASE: Lipase: 39 U/L (ref 13–78)

## 2020-03-04 LAB — HEPATITIS PANEL, ACUTE
Hep A IgM: NEGATIVE
Hep B C IgM: NEGATIVE
Hep C Virus Ab: <0.1 s/co ratio (ref 0.0–0.9)
Hepatitis B Surface Ag: NEGATIVE

## 2020-03-04 LAB — AMYLASE: Amylase: 144 U/L — ABNORMAL HIGH (ref 31–110)

## 2020-03-04 NOTE — Telephone Encounter (Signed)
Requesting to speak with a nurse about getting a refill on muscle relaxer med. Please call pt back.

## 2020-03-04 NOTE — Telephone Encounter (Signed)
RTC, self identified VM obtained, [place] compliant message left nurse returning call and to call back and ask for any triage nurse. SChaplin, RN,BSN

## 2020-03-04 NOTE — Progress Notes (Signed)
I thoroughly reviewed John Dickerson's labs.  CMP showed hyperkalemia 5.5, serum creatinine has improved.  Alkaline phosphatase mildly elevated at 125, serum amylase is improving at 144.  CBC shows leukocytosis of 12, hemoglobin of 12.4, hematocrit of 36.4, thrombocytosis with platelet of 516 from 419.  His abdominal ultrasound did not reveal acute cholecystitis.  In regards to his abnormal laboratory findings, he did not appear to have any signs of infection during my encounter.  In regards to his leukocytosis, this could be reactive from dehydration, iron deficiency versus bone marrow proliferative process given that he has leukocytosis with thrombocytosis.  I would repeat CBC with differential.  If it shows consistent elevated leukocytosis with thrombocytosis, I would obtain JAK2 lab with blood smear.

## 2020-03-05 ENCOUNTER — Ambulatory Visit (HOSPITAL_COMMUNITY)
Admission: RE | Admit: 2020-03-05 | Discharge: 2020-03-05 | Disposition: A | Discharge status: Home / Self Care | Payer: Medicare HMO | Source: Ambulatory Visit | Attending: Internal Medicine | Admitting: Internal Medicine

## 2020-03-05 ENCOUNTER — Other Ambulatory Visit: Payer: Self-pay

## 2020-03-05 ENCOUNTER — Telehealth: Payer: Self-pay

## 2020-03-05 ENCOUNTER — Telehealth: Payer: Self-pay | Admitting: *Deleted

## 2020-03-05 ENCOUNTER — Other Ambulatory Visit: Payer: Self-pay | Admitting: Internal Medicine

## 2020-03-05 ENCOUNTER — Inpatient Hospital Stay (HOSPITAL_COMMUNITY)
Admission: EM | Admit: 2020-03-05 | Discharge: 2020-03-09 | DRG: 445 | Disposition: A | Discharge status: Home Health | Payer: Medicare HMO | Attending: Internal Medicine | Admitting: Internal Medicine

## 2020-03-05 DIAGNOSIS — I129 Hypertensive chronic kidney disease with stage 1 through stage 4 chronic kidney disease, or unspecified chronic kidney disease: Secondary | ICD-10-CM | POA: Diagnosis present

## 2020-03-05 DIAGNOSIS — I2510 Atherosclerotic heart disease of native coronary artery without angina pectoris: Secondary | ICD-10-CM | POA: Diagnosis present

## 2020-03-05 DIAGNOSIS — Z7984 Long term (current) use of oral hypoglycemic drugs: Secondary | ICD-10-CM

## 2020-03-05 DIAGNOSIS — E7800 Pure hypercholesterolemia, unspecified: Secondary | ICD-10-CM | POA: Diagnosis present

## 2020-03-05 DIAGNOSIS — M109 Gout, unspecified: Secondary | ICD-10-CM | POA: Diagnosis present

## 2020-03-05 DIAGNOSIS — E785 Hyperlipidemia, unspecified: Secondary | ICD-10-CM | POA: Diagnosis present

## 2020-03-05 DIAGNOSIS — K810 Acute cholecystitis: Principal | ICD-10-CM | POA: Diagnosis present

## 2020-03-05 DIAGNOSIS — M1990 Unspecified osteoarthritis, unspecified site: Secondary | ICD-10-CM | POA: Diagnosis present

## 2020-03-05 DIAGNOSIS — Z7902 Long term (current) use of antithrombotics/antiplatelets: Secondary | ICD-10-CM

## 2020-03-05 DIAGNOSIS — Z8249 Family history of ischemic heart disease and other diseases of the circulatory system: Secondary | ICD-10-CM

## 2020-03-05 DIAGNOSIS — R1084 Generalized abdominal pain: Secondary | ICD-10-CM

## 2020-03-05 DIAGNOSIS — E1122 Type 2 diabetes mellitus with diabetic chronic kidney disease: Secondary | ICD-10-CM | POA: Diagnosis present

## 2020-03-05 DIAGNOSIS — Z87891 Personal history of nicotine dependence: Secondary | ICD-10-CM

## 2020-03-05 DIAGNOSIS — N179 Acute kidney failure, unspecified: Secondary | ICD-10-CM | POA: Diagnosis present

## 2020-03-05 DIAGNOSIS — N1831 Chronic kidney disease, stage 3a: Secondary | ICD-10-CM | POA: Diagnosis present

## 2020-03-05 DIAGNOSIS — Z20822 Contact with and (suspected) exposure to covid-19: Secondary | ICD-10-CM | POA: Diagnosis present

## 2020-03-05 DIAGNOSIS — R1011 Right upper quadrant pain: Secondary | ICD-10-CM | POA: Insufficient documentation

## 2020-03-05 DIAGNOSIS — E1159 Type 2 diabetes mellitus with other circulatory complications: Secondary | ICD-10-CM | POA: Diagnosis present

## 2020-03-05 DIAGNOSIS — Z79899 Other long term (current) drug therapy: Secondary | ICD-10-CM

## 2020-03-05 DIAGNOSIS — K819 Cholecystitis, unspecified: Secondary | ICD-10-CM | POA: Diagnosis present

## 2020-03-05 DIAGNOSIS — Z7982 Long term (current) use of aspirin: Secondary | ICD-10-CM

## 2020-03-05 DIAGNOSIS — Z833 Family history of diabetes mellitus: Secondary | ICD-10-CM

## 2020-03-05 DIAGNOSIS — I10 Essential (primary) hypertension: Secondary | ICD-10-CM | POA: Diagnosis present

## 2020-03-05 DIAGNOSIS — Z8619 Personal history of other infectious and parasitic diseases: Secondary | ICD-10-CM

## 2020-03-05 DIAGNOSIS — B20 Human immunodeficiency virus [HIV] disease: Secondary | ICD-10-CM | POA: Diagnosis present

## 2020-03-05 DIAGNOSIS — Z955 Presence of coronary angioplasty implant and graft: Secondary | ICD-10-CM

## 2020-03-05 DIAGNOSIS — E781 Pure hyperglyceridemia: Secondary | ICD-10-CM | POA: Diagnosis present

## 2020-03-05 LAB — CBC WITH DIFFERENTIAL/PLATELET
Abs Immature Granulocytes: 0.04 10*3/uL (ref 0.00–0.07)
Basophils Absolute: 0 10*3/uL (ref 0.0–0.2)
Basophils Absolute: 0 10*3/uL (ref 0.0–0.1)
Basophils Relative: 0 %
Basos: 0 %
EOS (ABSOLUTE): 0 10*3/uL (ref 0.0–0.4)
Eos: 0 %
Eosinophils Absolute: 0.2 10*3/uL (ref 0.0–0.5)
Eosinophils Relative: 1 %
HCT: 36.4 % — ABNORMAL LOW (ref 39.0–52.0)
Hematocrit: 35.6 % — ABNORMAL LOW (ref 37.5–51.0)
Hemoglobin: 12.1 g/dL — ABNORMAL LOW (ref 13.0–17.7)
Hemoglobin: 11.9 g/dL — ABNORMAL LOW (ref 13.0–17.0)
Immature Grans (Abs): 0 10*3/uL (ref 0.0–0.1)
Immature Granulocytes: 0 %
Immature Granulocytes: 0 %
Lymphocytes Absolute: 1.1 10*3/uL (ref 0.7–3.1)
Lymphocytes Relative: 13 %
Lymphs Abs: 1.7 10*3/uL (ref 0.7–4.0)
Lymphs: 8 %
MCH: 31.1 pg (ref 26.6–33.0)
MCH: 31.1 pg (ref 26.0–34.0)
MCHC: 34 g/dL (ref 31.5–35.7)
MCHC: 32.7 g/dL (ref 30.0–36.0)
MCV: 92 fL (ref 79–97)
MCV: 95 fL (ref 80.0–100.0)
Monocytes Absolute: 1.1 10*3/uL — ABNORMAL HIGH (ref 0.1–0.9)
Monocytes Absolute: 1.4 10*3/uL — ABNORMAL HIGH (ref 0.1–1.0)
Monocytes Relative: 11 %
Monocytes: 8 %
Neutro Abs: 9.4 10*3/uL — ABNORMAL HIGH (ref 1.7–7.7)
Neutrophils Absolute: 11.8 10*3/uL — ABNORMAL HIGH (ref 1.4–7.0)
Neutrophils Relative %: 75 %
Neutrophils: 84 %
Platelets: 545 10*3/uL — ABNORMAL HIGH (ref 150–450)
Platelets: 486 10*3/uL — ABNORMAL HIGH (ref 150–400)
RBC: 3.89 x10E6/uL — ABNORMAL LOW (ref 4.14–5.80)
RBC: 3.83 MIL/uL — ABNORMAL LOW (ref 4.22–5.81)
RDW: 11.6 % (ref 11.6–15.4)
RDW: 12 % (ref 11.5–15.5)
WBC: 14.1 10*3/uL — ABNORMAL HIGH (ref 3.4–10.8)
WBC: 12.7 10*3/uL — ABNORMAL HIGH (ref 4.0–10.5)
nRBC: 0 % (ref 0.0–0.2)

## 2020-03-05 LAB — COMPREHENSIVE METABOLIC PANEL
ALT: 24 U/L (ref 0–44)
AST: 24 U/L (ref 15–41)
Albumin: 3.1 g/dL — ABNORMAL LOW (ref 3.5–5.0)
Alkaline Phosphatase: 114 U/L (ref 38–126)
Anion gap: 11 (ref 5–15)
BUN: 20 mg/dL (ref 8–23)
CO2: 25 mmol/L (ref 22–32)
Calcium: 9.7 mg/dL (ref 8.9–10.3)
Chloride: 100 mmol/L (ref 98–111)
Creatinine, Ser: 1.63 mg/dL — ABNORMAL HIGH (ref 0.61–1.24)
GFR calc Af Amer: 52 mL/min — ABNORMAL LOW (ref >60)
GFR calc non Af Amer: 45 mL/min — ABNORMAL LOW (ref >60)
Glucose, Bld: 97 mg/dL (ref 70–99)
Potassium: 5 mmol/L (ref 3.5–5.1)
Sodium: 136 mmol/L (ref 135–145)
Total Bilirubin: 0.6 mg/dL (ref 0.3–1.2)
Total Protein: 7.7 g/dL (ref 6.5–8.1)

## 2020-03-05 LAB — BMP8+ANION GAP
Anion Gap: 16 mmol/L (ref 10.0–18.0)
BUN/Creatinine Ratio: 13 (ref 10–24)
BUN: 18 mg/dL (ref 8–27)
CO2: 21 mmol/L (ref 20–29)
Calcium: 9.6 mg/dL (ref 8.6–10.2)
Chloride: 95 mmol/L — ABNORMAL LOW (ref 96–106)
Creatinine, Ser: 1.36 mg/dL — ABNORMAL HIGH (ref 0.76–1.27)
GFR calc Af Amer: 64 mL/min/{1.73_m2} (ref >59)
GFR calc non Af Amer: 56 mL/min/{1.73_m2} — ABNORMAL LOW (ref >59)
Glucose: 147 mg/dL — ABNORMAL HIGH (ref 65–99)
Potassium: 4.7 mmol/L (ref 3.5–5.2)
Sodium: 132 mmol/L — ABNORMAL LOW (ref 134–144)

## 2020-03-05 LAB — LIPASE, BLOOD: Lipase: 34 U/L (ref 11–51)

## 2020-03-05 MED ORDER — IOHEXOL 9 MG/ML PO SOLN
ORAL | Status: AC
  Filled 2020-03-05: qty 1000

## 2020-03-05 NOTE — ED Triage Notes (Signed)
Reported was sent by PCP d/t gallbladder problems.

## 2020-03-05 NOTE — Addendum Note (Signed)
Addended by: Truddie Crumble on: 03/05/2020 11:36 AM   Modules accepted: Orders

## 2020-03-05 NOTE — Telephone Encounter (Signed)
Call report from Opal Sidles - pt had CT abd/pelvis which showed "Acute cholecystitis". Dr Eileen Stanford informed.

## 2020-03-05 NOTE — Telephone Encounter (Signed)
Authorization for CT has been approved by [facility], authorization # [id number] start from 9/17-10/17.

## 2020-03-05 NOTE — ED Triage Notes (Signed)
Emergency Medicine Provider Triage Evaluation Note  John Dickerson , a 59 y.o. male  was evaluated in triage.  Pt complains of right-sided abdominal pain.  Patient had an outpatient CT ordered by PCP today which showed acute infection of the gallbladder.  Patient denies fevers, chills, nausea, vomiting, abnormal urination abnormal bowel movements.  Chart review shows CT performed today which was positive for acute cholecystitis.  Review of Systems  Positive: R sided abd pain Negative: No fevers, n/v, abnormal BMs  Physical Exam  There were no vitals taken for this visit. Gen:   Awake, no distress   HEENT:  Atraumatic  Resp:  Normal effort  Cardiac:  Normal rate  Abd:   TTP of R sided abd. +murphys. +ventral hernia MSK:   Moves extremities without difficulty  Neuro:  Speech clear   Medical Decision Making  Medically screening exam initiated at 2:31 PM.  Appropriate orders placed.  Doreen Beam was informed that the remainder of the evaluation will be completed by another provider, this initial triage assessment does not replace that evaluation, and the importance of remaining in the ED until their evaluation is complete.  Clinical Impression   Acute cholecystitis dx by CT today.  Will obtain labs and preop Covid test.   Franchot Heidelberg, PA-C 03/05/20 1433

## 2020-03-06 ENCOUNTER — Emergency Department (HOSPITAL_COMMUNITY): Payer: Medicare HMO

## 2020-03-06 ENCOUNTER — Encounter (HOSPITAL_COMMUNITY): Payer: Self-pay | Admitting: Student

## 2020-03-06 DIAGNOSIS — E785 Hyperlipidemia, unspecified: Secondary | ICD-10-CM | POA: Diagnosis present

## 2020-03-06 DIAGNOSIS — Z79899 Other long term (current) drug therapy: Secondary | ICD-10-CM | POA: Diagnosis not present

## 2020-03-06 DIAGNOSIS — E781 Pure hyperglyceridemia: Secondary | ICD-10-CM | POA: Diagnosis present

## 2020-03-06 DIAGNOSIS — Z7982 Long term (current) use of aspirin: Secondary | ICD-10-CM | POA: Diagnosis not present

## 2020-03-06 DIAGNOSIS — Z7984 Long term (current) use of oral hypoglycemic drugs: Secondary | ICD-10-CM | POA: Diagnosis not present

## 2020-03-06 DIAGNOSIS — Z87891 Personal history of nicotine dependence: Secondary | ICD-10-CM | POA: Diagnosis not present

## 2020-03-06 DIAGNOSIS — Z8249 Family history of ischemic heart disease and other diseases of the circulatory system: Secondary | ICD-10-CM | POA: Diagnosis not present

## 2020-03-06 DIAGNOSIS — B20 Human immunodeficiency virus [HIV] disease: Secondary | ICD-10-CM | POA: Diagnosis present

## 2020-03-06 DIAGNOSIS — I2510 Atherosclerotic heart disease of native coronary artery without angina pectoris: Secondary | ICD-10-CM | POA: Diagnosis present

## 2020-03-06 DIAGNOSIS — Z833 Family history of diabetes mellitus: Secondary | ICD-10-CM | POA: Diagnosis not present

## 2020-03-06 DIAGNOSIS — E1122 Type 2 diabetes mellitus with diabetic chronic kidney disease: Secondary | ICD-10-CM | POA: Diagnosis present

## 2020-03-06 DIAGNOSIS — K810 Acute cholecystitis: Secondary | ICD-10-CM | POA: Diagnosis present

## 2020-03-06 DIAGNOSIS — Z7902 Long term (current) use of antithrombotics/antiplatelets: Secondary | ICD-10-CM | POA: Diagnosis not present

## 2020-03-06 DIAGNOSIS — N1831 Chronic kidney disease, stage 3a: Secondary | ICD-10-CM | POA: Diagnosis present

## 2020-03-06 DIAGNOSIS — Z20822 Contact with and (suspected) exposure to covid-19: Secondary | ICD-10-CM | POA: Diagnosis present

## 2020-03-06 DIAGNOSIS — N179 Acute kidney failure, unspecified: Secondary | ICD-10-CM | POA: Diagnosis present

## 2020-03-06 DIAGNOSIS — Z955 Presence of coronary angioplasty implant and graft: Secondary | ICD-10-CM | POA: Diagnosis not present

## 2020-03-06 DIAGNOSIS — M1990 Unspecified osteoarthritis, unspecified site: Secondary | ICD-10-CM | POA: Diagnosis present

## 2020-03-06 DIAGNOSIS — I129 Hypertensive chronic kidney disease with stage 1 through stage 4 chronic kidney disease, or unspecified chronic kidney disease: Secondary | ICD-10-CM | POA: Diagnosis present

## 2020-03-06 DIAGNOSIS — Z8619 Personal history of other infectious and parasitic diseases: Secondary | ICD-10-CM | POA: Diagnosis not present

## 2020-03-06 DIAGNOSIS — M109 Gout, unspecified: Secondary | ICD-10-CM | POA: Diagnosis present

## 2020-03-06 DIAGNOSIS — R1011 Right upper quadrant pain: Secondary | ICD-10-CM | POA: Diagnosis present

## 2020-03-06 DIAGNOSIS — E1159 Type 2 diabetes mellitus with other circulatory complications: Secondary | ICD-10-CM | POA: Diagnosis present

## 2020-03-06 DIAGNOSIS — E7800 Pure hypercholesterolemia, unspecified: Secondary | ICD-10-CM | POA: Diagnosis present

## 2020-03-06 HISTORY — PX: IR PERC CHOLECYSTOSTOMY: IMG2326

## 2020-03-06 LAB — CBG MONITORING, ED
Glucose-Capillary: 110 mg/dL — ABNORMAL HIGH (ref 70–99)
Glucose-Capillary: 107 mg/dL — ABNORMAL HIGH (ref 70–99)

## 2020-03-06 LAB — GLUCOSE, CAPILLARY
Glucose-Capillary: 124 mg/dL — ABNORMAL HIGH (ref 70–99)
Glucose-Capillary: 94 mg/dL (ref 70–99)

## 2020-03-06 LAB — GAMMA GT: GGT: 62 IU/L (ref 0–65)

## 2020-03-06 LAB — PROTIME-INR
INR: 1 (ref 0.8–1.2)
Prothrombin Time: 13.2 seconds (ref 11.4–15.2)

## 2020-03-06 LAB — SPECIMEN STATUS REPORT

## 2020-03-06 LAB — SARS CORONAVIRUS 2 BY RT PCR (HOSPITAL ORDER, PERFORMED IN ~~LOC~~ HOSPITAL LAB): SARS Coronavirus 2: NEGATIVE

## 2020-03-06 MED ORDER — HYDROMORPHONE HCL 1 MG/ML IJ SOLN
1.0000 mg | INTRAMUSCULAR | Status: DC | PRN
  Administered 2020-03-06 – 2020-03-08 (×5): 1 mg via INTRAVENOUS
  Filled 2020-03-06 (×5): qty 1

## 2020-03-06 MED ORDER — INSULIN ASPART 100 UNIT/ML ~~LOC~~ SOLN
0.0000 [IU] | SUBCUTANEOUS | Status: DC
  Administered 2020-03-06 – 2020-03-07 (×3): 1 [IU] via SUBCUTANEOUS
  Administered 2020-03-08: 2 [IU] via SUBCUTANEOUS
  Administered 2020-03-08 – 2020-03-09 (×4): 1 [IU] via SUBCUTANEOUS

## 2020-03-06 MED ORDER — METOPROLOL SUCCINATE ER 100 MG PO TB24
100.0000 mg | ORAL_TABLET | Freq: Every day | ORAL | Status: DC
  Administered 2020-03-06 – 2020-03-09 (×4): 100 mg via ORAL
  Filled 2020-03-06 (×4): qty 1

## 2020-03-06 MED ORDER — LIDOCAINE HCL (PF) 1 % IJ SOLN
INTRAMUSCULAR | Status: AC | PRN
  Administered 2020-03-06: 20 mL

## 2020-03-06 MED ORDER — MIDAZOLAM HCL 2 MG/2ML IJ SOLN
INTRAMUSCULAR | Status: AC | PRN
  Administered 2020-03-06 (×2): 1 mg via INTRAVENOUS

## 2020-03-06 MED ORDER — FENTANYL CITRATE (PF) 100 MCG/2ML IJ SOLN
INTRAMUSCULAR | Status: AC | PRN
  Administered 2020-03-06 (×3): 50 ug via INTRAVENOUS

## 2020-03-06 MED ORDER — BICTEGRAVIR-EMTRICITAB-TENOFOV 50-200-25 MG PO TABS
1.0000 | ORAL_TABLET | Freq: Every day | ORAL | Status: DC
  Administered 2020-03-06 – 2020-03-09 (×4): 1 via ORAL
  Filled 2020-03-06 (×4): qty 1

## 2020-03-06 MED ORDER — ALLOPURINOL 300 MG PO TABS
150.0000 mg | ORAL_TABLET | Freq: Every day | ORAL | Status: DC
  Administered 2020-03-06 – 2020-03-09 (×4): 150 mg via ORAL
  Filled 2020-03-06 (×2): qty 1
  Filled 2020-03-06: qty 0.5
  Filled 2020-03-06: qty 1

## 2020-03-06 MED ORDER — SODIUM CHLORIDE 0.9 % IV SOLN: INTRAVENOUS | Status: AC

## 2020-03-06 MED ORDER — CHLORHEXIDINE GLUCONATE 4 % EX LIQD
CUTANEOUS | Status: AC
  Filled 2020-03-06: qty 15

## 2020-03-06 MED ORDER — FENTANYL CITRATE (PF) 100 MCG/2ML IJ SOLN
INTRAMUSCULAR | Status: AC
  Filled 2020-03-06: qty 4

## 2020-03-06 MED ORDER — ONDANSETRON HCL 4 MG PO TABS: 4.0000 mg | ORAL_TABLET | Freq: Four times a day (QID) | ORAL | Status: DC | PRN

## 2020-03-06 MED ORDER — LIDOCAINE HCL 1 % IJ SOLN
INTRAMUSCULAR | Status: AC
  Filled 2020-03-06: qty 20

## 2020-03-06 MED ORDER — ASPIRIN EC 81 MG PO TBEC
81.0000 mg | DELAYED_RELEASE_TABLET | Freq: Every day | ORAL | Status: DC
  Administered 2020-03-07 – 2020-03-09 (×3): 81 mg via ORAL
  Filled 2020-03-06 (×3): qty 1

## 2020-03-06 MED ORDER — PIPERACILLIN-TAZOBACTAM 3.375 G IVPB 30 MIN
3.3750 g | Freq: Once | INTRAVENOUS | Status: AC
  Administered 2020-03-06: 3.375 g via INTRAVENOUS
  Filled 2020-03-06: qty 50

## 2020-03-06 MED ORDER — ATORVASTATIN CALCIUM 40 MG PO TABS
40.0000 mg | ORAL_TABLET | Freq: Every day | ORAL | Status: DC
  Administered 2020-03-07 – 2020-03-08 (×2): 40 mg via ORAL
  Filled 2020-03-06 (×2): qty 1

## 2020-03-06 MED ORDER — MORPHINE SULFATE (PF) 4 MG/ML IV SOLN
4.0000 mg | Freq: Once | INTRAVENOUS | Status: AC
  Administered 2020-03-06: 4 mg via INTRAVENOUS
  Filled 2020-03-06: qty 1

## 2020-03-06 MED ORDER — ONDANSETRON HCL 4 MG/2ML IJ SOLN: 4.0000 mg | Freq: Four times a day (QID) | INTRAMUSCULAR | Status: DC | PRN

## 2020-03-06 MED ORDER — ONDANSETRON HCL 4 MG/2ML IJ SOLN
4.0000 mg | Freq: Once | INTRAMUSCULAR | Status: AC
  Administered 2020-03-06: 4 mg via INTRAVENOUS
  Filled 2020-03-06: qty 2

## 2020-03-06 MED ORDER — SENNOSIDES-DOCUSATE SODIUM 8.6-50 MG PO TABS: 1.0000 | ORAL_TABLET | Freq: Every evening | ORAL | Status: DC | PRN

## 2020-03-06 MED ORDER — MIDAZOLAM HCL 2 MG/2ML IJ SOLN
INTRAMUSCULAR | Status: AC
  Filled 2020-03-06: qty 6

## 2020-03-06 MED ORDER — PIPERACILLIN-TAZOBACTAM 3.375 G IVPB 30 MIN
3.3750 g | Freq: Four times a day (QID) | INTRAVENOUS | Status: DC
  Administered 2020-03-06 – 2020-03-07 (×4): 3.375 g via INTRAVENOUS
  Filled 2020-03-06 (×6): qty 50

## 2020-03-06 MED ORDER — LACTATED RINGERS IV SOLN: INTRAVENOUS | Status: AC

## 2020-03-06 MED ORDER — HYDROMORPHONE HCL 1 MG/ML IJ SOLN: 0.5000 mg | INTRAMUSCULAR | Status: DC | PRN

## 2020-03-06 MED ORDER — SODIUM CHLORIDE 0.9% FLUSH
5.0000 mL | Freq: Three times a day (TID) | INTRAVENOUS | Status: DC
  Administered 2020-03-07 – 2020-03-09 (×6): 5 mL

## 2020-03-06 MED ORDER — IOHEXOL 300 MG/ML  SOLN
50.0000 mL | Freq: Once | INTRAMUSCULAR | Status: AC | PRN
  Administered 2020-03-06: 15 mL

## 2020-03-06 MED ORDER — SODIUM CHLORIDE 0.9 % IV BOLUS
500.0000 mL | Freq: Once | INTRAVENOUS | Status: AC
  Administered 2020-03-06: 500 mL via INTRAVENOUS

## 2020-03-06 NOTE — ED Provider Notes (Signed)
Medical screening examination/treatment/procedure(s) were conducted as a shared visit with non-physician practitioner(s) and myself.  I personally evaluated the patient during the encounter.  Here with abdominal pain. Going on and worsening for a few days. Seen by PCP, [place] a few days abo ok. CT today showed cholecystitis.  On my exam is distended with some e/o peritonitis. VS WNL. Plan fo rpain meds. Abx. Surgical consultation.         Merrily Pew, MD 03/06/20 [phone]

## 2020-03-06 NOTE — Progress Notes (Signed)
Patient seen and examined Several weeks of abdominal pain mainly in right upper abdomen Pain started worsening over the past few days Reports that he last took Plavix yesterday  Alert, resting comfortably Reducible fat-containing umbilical hernia, He has a transverse upper abdominal incision extending into right upper quadrant Significant right upper quadrant tenderness to palpation  Imaging reviewed  Acalculous cholecystitis  Given the chronicity of his symptoms, right upper quadrant inflammation on CT, prior upper abdominal surgery I think that he would be best managed with IV antibiotics and percutaneous cholecystostomy tube during this admission.  I am concerned about the degree of inflammation the right upper quadrant and potential scar tissue from his prior surgery as an infant which could increase risk for injury to surrounding structures/conversion to open procedure.  He states that he had surgery as an infant-given the incision location it is most consistent with pyloromyotomy although the length of the incision is slightly generous for a pyloromyotomy alone it is unclear what intra-abdominal procedure he has had in the past.  It is unclear whether or not he truly has cholelithiasis.  He does have a large amount of sludge and is possible he could have tiny gallstones within the sludge  If he truly only has acalculus cholecystitis he does not necessarily ultimately need cholecystectomy.  Can finalize that plan later on  Will consult IR for percutaneous cholecystostomy tube Check coags N.p.o. except meds  John Dickerson. Redmond Pulling, MD, FACS General, Bariatric, & Minimally Invasive Surgery [facility], [place]

## 2020-03-06 NOTE — Hospital Course (Addendum)
Acute cholecystitis s/p percutaneous drainage on 9/18 Patient presented with 3 weeks of RUQ pain worse after eating. Outpatient RUQ [place] with biliary sludge and gallbaldder enlargement, CT confirmed acute cholecystitis though no definite stones seen. Started on Zosyn. Surgery evaluated and with his lack of stones and hx of RUQ surgery as a child (likely for pyloric stenosis), decided IR procedure would be advantageous. IR performed percutaneous cholecystotomy on 9/18 with drainage of bile. Fever on 9/19 but improving abdominal exam. Changed Zosyn to Unasyn. Patient recovering well at this time, pain controlled with PO medications and tolerating solid foods. BM***. Discharged with Augmentin to complete a 5 day course postop.  Protuberant umbilicus, possible hernia  Patient reports this is a new problem since the time his RUQ pain started. On exam it is soft, tender, not dusky, not easily reproducible due to pain. CT scan without concern. F/u outpatient for resolution.  AKI on CKD - resolved Baseline creatinine around 1.2-1.3, presented with creatinine 1.63. Resolved with IV fluids, likely pre-renal.

## 2020-03-06 NOTE — Consult Note (Signed)
Reason for Consult:cholecystitis Referring Physician: Merrily Pew  John Dickerson is an 59 y.o. male.  HPI: 59yo M with PMHx HIV, DM, and HTN C/O L3 fell into right upper quadrant abdominal pain starting 3 weeks ago.  Gradually worsened and his primary care arrange for an ultrasound last Wednesday.  This showed gallbladder sludge but no clear acute cholecystitis.  He continued and worsening right upper quadrant pain and he underwent an outpatient CT scan of the abdomen pelvis on Friday at [facility].  This showed acute cholecystitis.  [facility] was referred to [facility] for [facility] and waited in the emergency department waiting room for some time.  White Blood cell count 12,700, LFTs normal.  Past Medical History:  Diagnosis Date  . Anemia   . Anemia due to medication 09/24/2015  . Arthritis    "feet" (11/19/2015)  . CAD (coronary artery disease), native coronary artery    a. remote LAD stent 1990s. b. s/p DESx2 to LAD 11/2015  . Hepatitis A immune 11/20/2016  . Hepatitis B immune 11/20/2016  . Hepatitis C    "going thru the treatments" (11/19/2015)  . High cholesterol   . History of gout    "just recently" (11/19/2015)  . HIV (human immunodeficiency virus infection) ([place])   . Hypertension   . Posterior pain of left hip 03/01/2018  . Type II diabetes mellitus ([place])    diagnosed 04-2015    Past Surgical History:  Procedure Laterality Date  . CARDIAC CATHETERIZATION N/A 11/19/2015   Procedure: Left Heart Cath and Coronary Angiography;  Surgeon: Jolaine Artist, MD;  Location: [place] [facility];  Service: Cardiovascular;  Laterality: N/A;  . CARDIAC CATHETERIZATION N/A 11/19/2015   Procedure: Coronary Stent Intervention;  Surgeon: Jettie Booze, MD;  Location: [place] [facility];  Service: Cardiovascular;  Laterality: N/A;  . CORONARY ANGIOPLASTY WITH STENT PLACEMENT  11/19/2015   "2 stents"    Family History  Problem Relation Age of Onset  . Diabetes Mother   . Cancer Mother         unkown type  . Hypertension Father   . Colon cancer Neg Hx     Social History:  reports that he quit smoking about 5 years ago. His smoking use included cigarettes. He started smoking about 7 years ago. He has a 5.88 pack-year smoking history. He has never used smokeless tobacco. He reports that he does not drink alcohol and does not use drugs.  Allergies: No Known Allergies  Medications: I have reviewed the patient's current medications.  Results for orders placed or performed during the hospital encounter of 03/05/20 (from the past 48 hour(s))  SARS Coronavirus 2 by RT PCR (hospital order, performed in [facility] hospital lab) Nasopharyngeal Nasopharyngeal Swab     Status: None   Collection Time: 03/05/20  5:45 AM   Specimen: Nasopharyngeal Swab  Result Value Ref Range   SARS Coronavirus 2 NEGATIVE NEGATIVE    Comment: (NOTE) SARS-CoV-2 target nucleic acids are NOT DETECTED.  The SARS-CoV-2 RNA is generally detectable in upper and lower respiratory specimens during the acute phase of infection. The lowest concentration of SARS-CoV-2 viral copies this assay can detect is 250 copies / mL. A negative result does not preclude SARS-CoV-2 infection and should not be used as the sole basis for treatment or other patient management decisions.  A negative result may occur with improper specimen collection / handling, submission of specimen other than nasopharyngeal swab, presence of viral mutation(s) within  the areas targeted by this assay, and inadequate number of viral copies (<250 copies / mL). A negative result must be combined with clinical observations, patient history, and epidemiological information.  Fact Sheet for Patients:   StrictlyIdeas.no  Fact Sheet for Healthcare Providers: BankingDealers.co.za  This test is not yet approved or  cleared by the [place] FDA and has been authorized for detection and/or diagnosis  of SARS-CoV-2 by FDA under an Emergency Use Authorization (EUA).  This EUA will remain in effect (meaning this test can be used) for the duration of the COVID-19 declaration under Section 564(b)(1) of the Act, 21 U.S.C. section 360bbb-3(b)(1), unless the authorization is terminated or revoked sooner.  Performed at [facility], [place] [address] [place], [place] [address]   CBC with Differential     Status: Abnormal   Collection Time: 03/05/20  2:57 PM  Result Value Ref Range   WBC 12.7 (H) 4.0 - 10.5 K/uL   RBC 3.83 (L) 4.22 - 5.81 MIL/uL   Hemoglobin 11.9 (L) 13.0 - 17.0 g/dL   HCT 36.4 (L) 39 - 52 %   MCV 95.0 80.0 - 100.0 fL   MCH 31.1 26.0 - 34.0 pg   MCHC 32.7 30.0 - 36.0 g/dL   RDW 12.0 11.5 - 15.5 %   Platelets 486 (H) 150 - 400 K/uL   nRBC 0.0 0.0 - 0.2 %   Neutrophils Relative % 75 %   Neutro Abs 9.4 (H) 1.7 - 7.7 K/uL   Lymphocytes Relative 13 %   Lymphs Abs 1.7 0.7 - 4.0 K/uL   Monocytes Relative 11 %   Monocytes Absolute 1.4 (H) 0 - 1 K/uL   Eosinophils Relative 1 %   Eosinophils Absolute 0.2 0 - 0 K/uL   Basophils Relative 0 %   Basophils Absolute 0.0 0 - 0 K/uL   Immature Granulocytes 0 %   Abs Immature Granulocytes 0.04 0.00 - 0.07 K/uL    Comment: Performed at [facility], [place] [address] [place], [place] [address]  Comprehensive metabolic panel     Status: Abnormal   Collection Time: 03/05/20  2:57 PM  Result Value Ref Range   Sodium 136 135 - 145 mmol/L   Potassium 5.0 3.5 - 5.1 mmol/L   Chloride 100 98 - 111 mmol/L   CO2 25 22 - 32 mmol/L   Glucose, Bld 97 70 - 99 mg/dL    Comment: Glucose reference range applies only to samples taken after fasting for at least 8 hours.   BUN 20 8 - 23 mg/dL   Creatinine, Ser 1.63 (H) 0.61 - 1.24 mg/dL   Calcium 9.7 8.9 - 10.3 mg/dL   Total Protein 7.7 6.5 - 8.1 g/dL   Albumin 3.1 (L) 3.5 - 5.0 g/dL   AST 24 15 - 41 U/L   ALT 24 0 - 44 U/L   Alkaline Phosphatase 114 38 - 126 U/L   Total Bilirubin 0.6  0.3 - 1.2 mg/dL   GFR calc non Af Amer 45 (L) >60 mL/min   GFR calc Af Amer 52 (L) >60 mL/min   Anion gap 11 5 - 15    Comment: Performed at [facility] [address] [place], [place] [address]  Lipase, blood     Status: None   Collection Time: 03/05/20  2:57 PM  Result Value Ref Range   Lipase 34 11 - 51 U/L    Comment: Performed at [facility], [address] [place],  [address]    CT Abdomen Pelvis Wo Contrast  Result Date: 03/05/2020 CLINICAL DATA:  RIGHT-sided abdominal pain for 2 weeks. EXAM: CT ABDOMEN AND PELVIS WITHOUT CONTRAST TECHNIQUE: Multidetector CT imaging of the abdomen and pelvis was performed following the standard protocol without IV contrast. COMPARISON:  March 03, 2020 FINDINGS: Lower chest: Coronary artery atherosclerotic calcifications. Scattered bibasilar atelectasis. Gynecomastia. Hepatobiliary: The gallbladder is distended with a thickened wall and adjacent fat stranding. No definitive intrahepatic biliary ductal dilation on this noncontrast examination. No definitive choledocholithiasis visualized on this noncontrast examination. Focal fatty deposition adjacent to the falciform ligament. Pancreas: Unremarkable noncontrast appearance of the pancreas. Spleen: Normal in size without focal abnormality. Adrenals/Urinary Tract: Bilateral adrenal glands are unremarkable. No nephrolithiasis. No hydronephrosis. Bladder is unremarkable. Stomach/Bowel: Stomach is within normal limits. Appendix appears normal. No evidence of bowel wall thickening, distention, or inflammatory changes. Vascular/Lymphatic: Moderate to severe aortic atherosclerotic calcifications. No suspicious lymphadenopathy. Reproductive: Prostate is unremarkable. Other: Fat containing umbilical hernia. Musculoskeletal: Degenerative changes of the lumbar spine. IMPRESSION: 1. Constellation of findings are consistent with acute cholecystitis. No definitive radiopaque choledocholithiasis  visualized. Aortic Atherosclerosis (ICD10-I70.0). These results will be called to the ordering clinician or representative by the Radiologist Assistant, and communication documented in the PACS or [employer]. Electronically Signed   By: Valentino Saxon MD   On: 03/05/2020 13:00    Review of Systems  Constitutional: Negative.   HENT: Negative.   Eyes: Negative.   Respiratory: Negative for chest tightness and shortness of breath.   Cardiovascular: Negative for chest pain.  Gastrointestinal: Positive for abdominal pain. Negative for diarrhea and vomiting.  Endocrine: Negative.   Genitourinary: Negative.   Musculoskeletal: Negative.   Allergic/Immunologic: Negative.   Neurological: Negative.   Hematological: Negative.   Psychiatric/Behavioral: Negative.    Blood pressure (!) 150/72, pulse 88, temperature 97.9 F (36.6 C), temperature source Oral, resp. rate 18, SpO2 98 %. Physical Exam Constitutional:      Appearance: He is well-developed. He is not ill-appearing.  HENT:     Head: Normocephalic.     Mouth/Throat:     Mouth: Mucous membranes are moist.  Eyes:     Extraocular Movements: Extraocular movements intact.     Pupils: Pupils are equal, round, and reactive to light.  Cardiovascular:     Rate and Rhythm: Normal rate and regular rhythm.     Heart sounds: Normal heart sounds.  Pulmonary:     Effort: Pulmonary effort is normal. No respiratory distress.  Abdominal:     General: Bowel sounds are decreased.     Palpations: Abdomen is soft. There is no fluid wave.     Tenderness: There is abdominal tenderness in the right upper quadrant. There is guarding. There is no rebound.     Hernia: A hernia is present. Hernia is present in the umbilical area.  Skin:    General: Skin is warm and dry.     Capillary Refill: Capillary refill takes 2 to 3 seconds.  Neurological:     Mental Status: He is alert and oriented to person, place, and time.  Psychiatric:        Mood and  Affect: Mood normal.     Assessment/Plan: Acute cholecystitis -agree with Zosyn and medical admission.  We will plan laparoscopic cholecystectomy depending on how recently he hsr tsken his Plavix.  I will discuss further with Dr. Redmond Pulling.   HIV, hypertension, and possible CKD per primary.  Zenovia Jarred 03/06/2020,  6:48 AM

## 2020-03-06 NOTE — ED Provider Notes (Signed)
[facility] EMERGENCY DEPARTMENT Provider Note   CSN: [id number] Arrival date & time: 03/05/20  1401     History Chief Complaint  Patient presents with  . Abdominal Pain    John Dickerson is a 59 y.o. male with history of HIV, T2DM, hypertension, hypercholesterolemia, hepatitis C, and CAD who presents to the emergency department with complaints of intermittent abdominal pain for the past 4 weeks which has acutely worsened over the past few days.  Patient states pain is located in the right upper quadrant, is now constant, severe, feels like it is progressively worsening.  No alleviating or aggravating factors.  He can only eat small amounts.  He went to his PCP and had an outpatient CT scan which showed findings concerning for acute cholecystitis and he was sent to the emergency department.  He states his pain has worsened since arrival.  He denies fever, chills, nausea, vomiting, chest pain, dyspnea, diarrhea, melena, hematochezia, or dysuria.  Last p.o. intake was 7 PM last night.  HPI     Past Medical History:  Diagnosis Date  . Anemia   . Anemia due to medication 09/24/2015  . Arthritis    "feet" (11/19/2015)  . CAD (coronary artery disease), native coronary artery    a. remote LAD stent 1990s. b. s/p DESx2 to LAD 11/2015  . Hepatitis A immune 11/20/2016  . Hepatitis B immune 11/20/2016  . Hepatitis C    "going thru the treatments" (11/19/2015)  . High cholesterol   . History of gout    "just recently" (11/19/2015)  . HIV (human immunodeficiency virus infection) ([place])   . Hypertension   . Posterior pain of left hip 03/01/2018  . Type II diabetes mellitus ([place])    diagnosed 04-2015    Patient Active Problem List   Diagnosis Date Noted  . RUQ pain 03/03/2020  . Gluteal pain (left) 04/16/2019  . Contact dermatitis 01/08/2019  . Rash 10/29/2018  . Chronic hepatitis C without hepatic coma ([place]) 08/21/2018  . Cataracts, bilateral 05/21/2017  . S/P angioplasty  with stent, 11/19/15 DES X 2 mLAD and dLAD, normal EF  11/20/2015  . Coronary artery disease involving native coronary artery of native heart   . Exertional angina (HCC) 11/17/2015  . Gout 10/29/2015  . Type 2 diabetes mellitus with circulatory disorder causing erectile dysfunction ([place]) 05/11/2015  . Preventative health care 05/11/2015  . Hyperlipemia 05/11/2015  . Arthritis of shoulder region, left 03/24/2013  . Hypertriglyceridemia 03/24/2013  . ERECTILE DYSFUNCTION 09/12/2006  . Human immunodeficiency virus (HIV) disease ([place]) 08/09/2006  . Essential hypertension 08/09/2006    Past Surgical History:  Procedure Laterality Date  . CARDIAC CATHETERIZATION N/A 11/19/2015   Procedure: Left Heart Cath and Coronary Angiography;  Surgeon: Jolaine Artist, MD;  Location: [facility];  Service: Cardiovascular;  Laterality: N/A;  . CARDIAC CATHETERIZATION N/A 11/19/2015   Procedure: Coronary Stent Intervention;  Surgeon: Jettie Booze, MD;  Location: [facility];  Service: Cardiovascular;  Laterality: N/A;  . CORONARY ANGIOPLASTY WITH STENT PLACEMENT  11/19/2015   "2 stents"       Family History  Problem Relation Age of Onset  . Diabetes Mother   . Cancer Mother        unkown type  . Hypertension Father   . Colon cancer Neg Hx     Social History   Tobacco Use  . Smoking status: Former Smoker    Packs/day: 0.12    Years:  49.00    Pack years: 5.88    Types: Cigarettes    Start date: 07/20/2012    Quit date: 02/18/2015    Years since quitting: 5.0  . Smokeless tobacco: Never Used  Vaping Use  . Vaping Use: Never used  Substance Use Topics  . Alcohol use: No    Alcohol/week: 0.0 standard drinks  . Drug use: No    Home Medications Prior to Admission medications   Medication Sig Start Date End Date Taking? Authorizing Provider  acetaminophen (TYLENOL) 500 MG tablet Take 2 tablets (1,000 mg total) by mouth every 6 (six) hours as needed for moderate pain. 08/29/19    Darr, Marguerita Beards, PA-C  allopurinol (ZYLOPRIM) 100 MG tablet TAKE 1 AND 1/2 TABLETS(150 MG) BY MOUTH DAILY 07/28/19   Jean Rosenthal, MD  amLODipine (NORVASC) 10 MG tablet Take 1 tablet (10 mg total) by mouth daily. 11/03/19   Axel Filler, MD  aspirin EC 81 MG tablet Take 1 tablet (81 mg total) by mouth daily. 11/17/15   Bensimhon, Shaune Pascal, MD  atorvastatin (LIPITOR) 40 MG tablet Take 1 tablet (40 mg total) by mouth daily at 6 PM. 03/21/19   Jettie Booze, MD  BIKTARVY 50-200-25 MG TABS tablet TAKE 1 TABLET BY MOUTH DAILY 12/01/19   Campbell Riches, MD  blood glucose meter kit and supplies KIT Dispense based on patient and insurance preference. Use up to four times daily as directed. (FOR ICD-9 250.00, 250.01). Patient not taking: Reported on 07/11/2019 08/03/15   Bethena Roys, MD  Blood Glucose Monitoring Suppl ([place]) w/Device KIT 1 each by Does not apply route daily. Patient not taking: Reported on 07/11/2019 12/22/15   Collier Salina, MD  clopidogrel (PLAVIX) 75 MG tablet Take 1 tablet (75 mg total) by mouth daily. 12/04/19   Jean Rosenthal, MD  Continuous Blood Gluc Receiver (FREESTYLE LIBRE 2 READER SYSTM) DEVI 1 Device by Does not apply route 3 (three) times daily. Patient not taking: Reported on 07/11/2019 01/08/19   Jean Rosenthal, MD  Continuous Blood Gluc Sensor (FREESTYLE LIBRE 2 SENSOR SYSTM) MISC 1 Device by Does not apply route 3 (three) times daily. Patient not taking: Reported on 07/11/2019 01/08/19   Jean Rosenthal, MD  glucose blood (ONETOUCH VERIO) test strip Check blood sugar up to 1 time a day Patient not taking: Reported on 07/11/2019 12/22/15   Collier Salina, MD  losartan (COZAAR) 100 MG tablet TAKE 1 TABLET(100 MG) BY MOUTH DAILY 04/30/19   Jettie Booze, MD  metFORMIN (GLUCOPHAGE) 1000 MG tablet TAKE 1 TABLET BY MOUTH TWICE DAILY WITH A MEAL 12/01/19   Agyei, Caprice Kluver, MD  metoprolol succinate (TOPROL-XL) 100 MG 24 hr tablet TAKE  1 TABLET BY MOUTH DAILY WITH OR IMMEDAITELY FOLLOWING A MEAL 10/21/19   Agyei, Caprice Kluver, MD  nitroGLYCERIN (NITROSTAT) 0.4 MG SL tablet Place 1 tablet (0.4 mg total) under the tongue every 5 (five) minutes as needed for chest pain. 11/17/15   Bensimhon, Shaune Pascal, MD  [facility] DELICA LANCETS FINE MISC Check blood sugar up to 1 time a day Patient not taking: Reported on 07/11/2019 12/22/15   Collier Salina, MD  spironolactone (ALDACTONE) 25 MG tablet Take 1 tablet (25 mg total) by mouth daily. Need to make appt with provider for further refills - 1st attempt 02/25/20   Jettie Booze, MD    Allergies    Patient has no known allergies.  Review of Systems   Review of Systems  Constitutional: Negative for chills and fever.  Respiratory: Negative for shortness of breath.   Cardiovascular: Negative for chest pain.  Gastrointestinal: Positive for abdominal pain. Negative for blood in stool, constipation, diarrhea, nausea and vomiting.  Genitourinary: Negative for dysuria.  Neurological: Negative for syncope.  All other systems reviewed and are negative.   Physical Exam Updated Vital Signs BP (!) 150/72   Pulse 88   Temp 97.9 F (36.6 C) (Oral)   Resp 18   SpO2 98%   Physical Exam Vitals and nursing note reviewed.  Constitutional:      General: He is in acute distress (mild appears uncomfortable).     Appearance: He is well-developed. He is not toxic-appearing.  HENT:     Head: Normocephalic and atraumatic.  Eyes:     General:        Right eye: No discharge.        Left eye: No discharge.     Conjunctiva/sclera: Conjunctivae normal.  Cardiovascular:     Rate and Rhythm: Normal rate and regular rhythm.  Pulmonary:     Effort: Pulmonary effort is normal. No respiratory distress.     Breath sounds: Normal breath sounds. No wheezing, rhonchi or rales.  Abdominal:     General: There is distension.     Palpations: Abdomen is soft.     Tenderness: There is abdominal tenderness  (generalized w/ increased focality in the RUQ). There is guarding and rebound. Positive signs include Murphy's sign.  Musculoskeletal:     Cervical back: Neck supple.  Skin:    General: Skin is warm and dry.     Findings: No rash.  Neurological:     Mental Status: He is alert.     Comments: Clear speech.   Psychiatric:        Behavior: Behavior normal.    ED Results / Procedures / Treatments   Labs (all labs ordered are listed, but only abnormal results are displayed) Labs Reviewed  CBC WITH DIFFERENTIAL/PLATELET - Abnormal; Notable for the following components:      Result Value   WBC 12.7 (*)    RBC 3.83 (*)    Hemoglobin 11.9 (*)    HCT 36.4 (*)    Platelets 486 (*)    Neutro Abs 9.4 (*)    Monocytes Absolute 1.4 (*)    All other components within normal limits  COMPREHENSIVE METABOLIC PANEL - Abnormal; Notable for the following components:   Creatinine, Ser 1.63 (*)    Albumin 3.1 (*)    GFR calc non Af Amer 45 (*)    GFR calc Af Amer 52 (*)    All other components within normal limits  SARS CORONAVIRUS 2 BY RT PCR (HOSPITAL ORDER, [facility])  LIPASE, BLOOD    EKG None  Radiology CT Abdomen Pelvis Wo Contrast  Result Date: 03/05/2020 CLINICAL DATA:  RIGHT-sided abdominal pain for 2 weeks. EXAM: CT ABDOMEN AND PELVIS WITHOUT CONTRAST TECHNIQUE: Multidetector CT imaging of the abdomen and pelvis was performed following the standard protocol without IV contrast. COMPARISON:  March 03, 2020 FINDINGS: Lower chest: Coronary artery atherosclerotic calcifications. Scattered bibasilar atelectasis. Gynecomastia. Hepatobiliary: The gallbladder is distended with a thickened wall and adjacent fat stranding. No definitive intrahepatic biliary ductal dilation on this noncontrast examination. No definitive choledocholithiasis visualized on this noncontrast examination. Focal fatty deposition adjacent to the falciform ligament. Pancreas: Unremarkable  noncontrast appearance of the pancreas. Spleen:  Normal in size without focal abnormality. Adrenals/Urinary Tract: Bilateral adrenal glands are unremarkable. No nephrolithiasis. No hydronephrosis. Bladder is unremarkable. Stomach/Bowel: Stomach is within normal limits. Appendix appears normal. No evidence of bowel wall thickening, distention, or inflammatory changes. Vascular/Lymphatic: Moderate to severe aortic atherosclerotic calcifications. No suspicious lymphadenopathy. Reproductive: Prostate is unremarkable. Other: Fat containing umbilical hernia. Musculoskeletal: Degenerative changes of the lumbar spine. IMPRESSION: 1. Constellation of findings are consistent with acute cholecystitis. No definitive radiopaque choledocholithiasis visualized. Aortic Atherosclerosis (ICD10-I70.0). These results will be called to the ordering clinician or representative by the Radiologist Assistant, and communication documented in the PACS or [employer]. Electronically Signed   By: Valentino Saxon MD   On: 03/05/2020 13:00    Procedures Procedures (including critical care time)  Medications Ordered in ED Medications - No data to display  ED Course  I have reviewed the triage vital signs and the nursing notes.  Pertinent labs & imaging results that were available during my care of the patient were reviewed by me and considered in my medical decision making (see chart for details).    MDM Rules/Calculators/A&P                         Patient presents to the ED with complaints of abdominal pain.  Patient appears mildly uncomfortable, his blood pressure is mildly elevated, vitals otherwise within normal limits.  On exam patient has abdominal distention, tenderness, as well as peritoneal signs.  Additional history obtained:  Additional history obtained from chart review and nursing note reviewed.  CT imaging yesterday showed findings of acute cholecystitis..   Lab Tests:  I reviewed and interpreted labs,  which included:  CBC: Mild leukocytosis. Anemia fairly similar to prior.  CMP: Uptrending creatinine.  LFTs within normal limits. Lipase: Within normal limits CovidTest: Negative  Patient exam concerning for peritonitis, CT w/ cholecystitis yesterday.  Will start Zosyn, have also ordered analgesics, antiemetics, and fluids. Consult placed to general surgery.   04:59: CONSULT: Discussed patient with general surgeon Dr. Grandville Silos- will see patient in consultation.  --> Re-discussed w/ Dr. Grandville Silos, plan to admit to medicine.  06:49: CONSULT: Discussed with internal medicine service- accept admission.   06:55: RE-EVAL: Patient feeling improved, resting more comfortable, has been seen by general surgery.   Findings and plan of care discussed with supervising physician Dr. Dayna Barker who is in agreement.   Portions of this note were generated with [employer]. Dictation errors may occur despite best attempts at proofreading.  Final Clinical Impression(s) / ED Diagnoses Final diagnoses:  Generalized abdominal pain  Acute cholecystitis    Rx / DC Orders ED Discharge Orders    None       Leafy Kindle 03/06/20 0034    Mesner, Corene Cornea, MD 03/06/20 9179    Merrily Pew, MD 03/06/20 2311

## 2020-03-06 NOTE — ED Notes (Signed)
Transported to IR 

## 2020-03-06 NOTE — H&P (Addendum)
Date: 03/06/2020               Patient Name:  John Dickerson MRN: [id number]  DOB: [birth year] Age / Sex: 59 y.o., male   PCP: Jean Rosenthal, MD         Medical Service: Internal Medicine Teaching Service         Attending Physician: Dr. Lalla Brothers    First Contact: Dr. Bridgett Larsson Pager: [phone]  Second Contact: Dr. Myrtie Hawk Pager: [phone]       After Hours (After 5p/  First Contact Pager: [phone]  weekends / holidays): Second Contact Pager: [phone]   Chief Complaint: RUQ abdominal pain  History of Present Illness:   59 y/o M with hx of type II DM, HTN, HIV, HLD, hep C, CAD s/p stents presenting with RUQ abdominal pain which started 3 weeks ago, gradually worsening over time. Sometimes worse after eating. Denies fever, chills, n/d, diarrhea, constipation. Mild lightheadedness when standing up quickly. No appetite currently. Outpatient RUQ [place] with gallbladder sludge but no acute cholecystitis, then subsequent CT abd/pelvis demonstrated acute cholecystitis. Has never had this pain before. Had an unknown abdominal surgery as a child, scar noted over R mid abdomen. Pain is currently well controlled with medications here. Zosyn started.   Meds:  Current Outpatient Medications  Medication Instructions   acetaminophen (TYLENOL) 1,000 mg, Oral, Every 6 hours PRN   allopurinol (ZYLOPRIM) 100 MG tablet TAKE 1 AND 1/2 TABLETS(150 MG) BY MOUTH DAILY   amLODipine (NORVASC) 10 mg, Oral, Daily   aspirin EC 81 mg, Oral, Daily   atorvastatin (LIPITOR) 40 mg, Oral, Daily-1800   BIKTARVY 50-200-25 MG TABS tablet TAKE 1 TABLET BY MOUTH DAILY   blood glucose meter kit and supplies KIT Dispense based on patient and insurance preference. Use up to four times daily as directed. (FOR ICD-9 250.00, 250.01).   Blood Glucose Monitoring Suppl (ONETOUCH VERIO FLEX SYSTEM) w/Device KIT 1 each, Does not apply, Daily   clopidogrel (PLAVIX) 75 mg, Oral, Daily   Continuous Blood Gluc Receiver  (FREESTYLE LIBRE 2 READER SYSTM) DEVI 1 Device, Does not apply, 3 times daily   Continuous Blood Gluc Sensor (FREESTYLE LIBRE 2 SENSOR SYSTM) MISC 1 Device, Does not apply, 3 times daily   glucose blood (ONETOUCH VERIO) test strip Check blood sugar up to 1 time a day   losartan (COZAAR) 100 MG tablet TAKE 1 TABLET(100 MG) BY MOUTH DAILY   metFORMIN (GLUCOPHAGE) 1000 MG tablet TAKE 1 TABLET BY MOUTH TWICE DAILY WITH A MEAL   metoprolol succinate (TOPROL-XL) 100 MG 24 hr tablet TAKE 1 TABLET BY MOUTH DAILY WITH OR IMMEDAITELY FOLLOWING A MEAL   nitroGLYCERIN (NITROSTAT) 0.4 mg, Sublingual, Every 5 min PRN   ONETOUCH DELICA LANCETS FINE MISC Check blood sugar up to 1 time a day   spironolactone (ALDACTONE) 25 mg, Oral, Daily, Need to make appt with provider for further refills - 1st attempt     Allergies: Allergies as of 03/05/2020   (No Known Allergies)   Past Medical History:  Diagnosis Date   Anemia    Anemia due to medication 09/24/2015   Arthritis    "feet" (11/19/2015)   CAD (coronary artery disease), native coronary artery    a. remote LAD stent 1990s. b. s/p DESx2 to LAD 11/2015   Hepatitis A immune 11/20/2016   Hepatitis B immune 11/20/2016   Hepatitis C    "going thru the treatments" (11/19/2015)   High cholesterol  History of gout    "just recently" (11/19/2015)   HIV (human immunodeficiency virus infection) ([place])    Hypertension    Posterior pain of left hip 03/01/2018   Type II diabetes mellitus ([place])    diagnosed 04-2015    Family History:  Family History  Problem Relation Age of Onset   Diabetes Mother    Cancer Mother        unkown type   Hypertension Father    Colon cancer Neg Hx     Social History:  Social History   Tobacco Use   Smoking status: Former Smoker    Packs/day: 0.12    Years: 49.00    Pack years: 5.88    Types: Cigarettes    Start date: 07/20/2012    Quit date: 02/18/2015    Years since quitting: 5.0   Smokeless  tobacco: Never Used  Vaping Use   Vaping Use: Never used  Substance Use Topics   Alcohol use: No    Alcohol/week: 0.0 standard drinks   Drug use: No    Review of Systems: Review of Systems  Constitutional: Negative for chills and fever.  HENT: Negative for congestion and sore throat.   Eyes: Negative for blurred vision and double vision.  Respiratory: Negative for cough and shortness of breath.   Cardiovascular: Negative for chest pain and palpitations.  Gastrointestinal: Positive for abdominal pain. Negative for blood in stool, constipation, diarrhea, nausea and vomiting.  Genitourinary: Negative for dysuria and frequency.  Musculoskeletal: Negative for back pain and myalgias.  Skin: Negative for itching and rash.  Neurological: Positive for dizziness. Negative for loss of consciousness and headaches.     Physical Exam: Blood pressure (!) 150/72, pulse 88, temperature 97.9 F (36.6 C), temperature source Oral, resp. rate 18, SpO2 98 %. Physical Exam Vitals and nursing note reviewed.  Constitutional:      General: He is not in acute distress.    Appearance: He is ill-appearing.  HENT:     Head: Normocephalic and atraumatic.     Mouth/Throat:     Mouth: Mucous membranes are moist.  Eyes:     Extraocular Movements: Extraocular movements intact.  Cardiovascular:     Rate and Rhythm: Normal rate and regular rhythm.     Heart sounds: Normal heart sounds. No murmur heard.  No friction rub. No gallop.   Pulmonary:     Effort: Pulmonary effort is normal. No respiratory distress.     Breath sounds: Normal breath sounds. No stridor. No wheezing, rhonchi or rales.  Abdominal:     General: Bowel sounds are decreased. There is distension.     Palpations: Abdomen is soft.     Tenderness: There is abdominal tenderness in the right upper quadrant, right lower quadrant and epigastric area. There is rebound. There is no guarding. Positive signs include Murphy's sign. Negative signs  include McBurney's sign.  Skin:    General: Skin is warm and dry.     Capillary Refill: Capillary refill takes less than 2 seconds.  Neurological:     General: No focal deficit present.     Mental Status: He is alert and oriented to person, place, and time.  Psychiatric:        Mood and Affect: Mood normal.        Behavior: Behavior normal.     EKG this admission still pending personally reviewed most recent EKG from 03/04/19, my interpretation is NSR with old T wave inversions and occasional PVCs  Assessment &  Plan by Problem: Active Problems:   Human immunodeficiency virus (HIV) disease ([place])   Essential hypertension   Hypertriglyceridemia   Type 2 diabetes mellitus with circulatory disorder causing erectile dysfunction (HCC)   Coronary artery disease involving native coronary artery of native heart   S/P angioplasty with stent, 11/19/15 DES X 2 mLAD and dLAD, normal EF    Chronic hepatitis C without hepatic coma ([place])   Acute cholecystitis  John Dickerson is a 59 y.o. year old male with hx of type II DM, HTN, HIV, HLD, hep C, CAD s/p stents presenting with acute cholecystitis. Currently stable, pending surgery most likely today.  Acute cholecystitis RUQ pain for 3 weeks, CT consistent with acute cholecystitis. Murphy sign positive, rebound tenderness in the RUQ. Hemodynamically stable. Started on Zosyn.  -surgery following, planning on lap chole -continue Zosyn -maintenance LR -zofran prn, no nausea currently  AKI on CKD Baseline 1.2-1.3, presenting with creatinine of 1.63. Likely pre-rental -maintenance LR -daily BMP  CAD s/p DES stents in 2017 Reports angina in 2017, had a cath with severe LAD disease, DES stents to mid LAD and distal LAD. On ASA and Plavix at home. Last cardiology visit September 2020. No echo on file.  -no need for Plavix this far out from his stents, will discontinue -resume home ASA, lipitor 6m after surgery  Chronic hepatits C Positive Ab  in 2008, treated by ID. Last [place] without concern. Last 2 HCV quant negative.   HIV Stable on Biktarvy daily. Last testing in November 2020 with quant RNA <20, CD4 count 634. -continue Biktarvy  Essential HTN On amlodipine 151m losartan 10072mToprol 100m59mpironolactone 25mg67mhome. BP 130-150/70s this admission. -resume home BP meds after surgery  DM Last A1c of 6.4 on 03/03/20. Well controlled on metformin 1000mg 69mat home.  -SSI   Dispo: Admit patient to Inpatient with expected length of stay greater than 2 midnights.  Signed: Herny Scurlock, Andrew Au/18/2021, 6:57 AM  Pager: [phone] 5pm on weekdays and 1pm on weekends: On Call pager: [phone]

## 2020-03-06 NOTE — Procedures (Signed)
Pre procedural Dx: Acute cholecysitis Post procedural Dx: Same  Technically successful [place] and Fluoro guided placement of a 10 Fr drainage catheter placement into the gallbladder lumen. Chole tube connected to gravity bag.  A sample of aspirated purulent bile was capped and sent to the lab for analysis.   EBL: None Complications: None immediate  Ronny Bacon, MD Pager #: [phone]

## 2020-03-06 NOTE — Consult Note (Signed)
Chief Complaint: Patient was seen in consultation today for acute cholecystitis/cholecystostomy tube placement.  Referring Physician(s): Greer Pickerel ([place])  Supervising Physician: Sandi Mariscal  Patient Status: [facility] - ED  History of Present Illness: John Dickerson is a 59 y.o. male with a past medical history of hypertension, high cholesterol, CAD s/p stenting 1990s and 2017, hepatitis C, diabetes mellitus type II, HIV, arthritis, and gout. He presented to [facility] ED 03/05/2020 at the recommendation of his PCP due to CT abdomen/pelvis concerning for acute cholecystitis. Per notes, patient has had intermittent abdominal pain x 4 weeks and went to his PCP for further evaluation. Patient was admitted for further management. General surgery was consulted who states no role for surgical intervention at this time, and recommends IR consult for possible cholecystostomy tube placement.  CT abdomen/pelvix 03/05/2020: 1. Constellation of findings are consistent with acute cholecystitis. No definitive radiopaque choledocholithiasis visualized.  IR consulted by Dr. Redmond Pulling for possible image-guided percutaneous cholecystostomy tube placement. Patient awake and alert laying in bed. Complains of abdominal pain, rated 8/10 at this time, stable since admission. Denies N/V associated with abdominal pain. Denies fever, chills, chest pain, dyspnea, or headache.  LD Plavix yesterday 03/05/2020.   Past Medical History:  Diagnosis Date   Anemia    Anemia due to medication 09/24/2015   Arthritis    "feet" (11/19/2015)   CAD (coronary artery disease), native coronary artery    a. remote LAD stent 1990s. b. s/p DESx2 to LAD 11/2015   Hepatitis A immune 11/20/2016   Hepatitis B immune 11/20/2016   Hepatitis C    "going thru the treatments" (11/19/2015)   High cholesterol    History of gout    "just recently" (11/19/2015)   HIV (human immunodeficiency virus infection) ([place])    Hypertension    Posterior  pain of left hip 03/01/2018   Type II diabetes mellitus ([place])    diagnosed 04-2015    Past Surgical History:  Procedure Laterality Date   CARDIAC CATHETERIZATION N/A 11/19/2015   Procedure: Left Heart Cath and Coronary Angiography;  Surgeon: Jolaine Artist, MD;  Location: [place] [facility];  Service: Cardiovascular;  Laterality: N/A;   CARDIAC CATHETERIZATION N/A 11/19/2015   Procedure: Coronary Stent Intervention;  Surgeon: Jettie Booze, MD;  Location: [place] [facility];  Service: Cardiovascular;  Laterality: N/A;   CORONARY ANGIOPLASTY WITH STENT PLACEMENT  11/19/2015   "2 stents"   other     unknown abdominal surgery in childhood    Allergies: Patient has no known allergies.  Medications: Prior to Admission medications   Medication Sig Start Date End Date Taking? Authorizing Provider  acetaminophen (TYLENOL) 500 MG tablet Take 2 tablets (1,000 mg total) by mouth every 6 (six) hours as needed for moderate pain. 08/29/19  Yes Darr, Marguerita Beards, PA-C  allopurinol (ZYLOPRIM) 100 MG tablet TAKE 1 AND 1/2 TABLETS(150 MG) BY MOUTH DAILY Patient taking differently: Take 150 mg by mouth daily.  07/28/19  Yes Agyei, Caprice Kluver, MD  amLODipine (NORVASC) 10 MG tablet Take 1 tablet (10 mg total) by mouth daily. 11/03/19  Yes Axel Filler, MD  aspirin EC 81 MG tablet Take 1 tablet (81 mg total) by mouth daily. 11/17/15  Yes Bensimhon, Shaune Pascal, MD  atorvastatin (LIPITOR) 40 MG tablet Take 1 tablet (40 mg total) by mouth daily at 6 PM. 03/21/19  Yes Jettie Booze, MD  BIKTARVY 50-200-25 MG TABS tablet TAKE 1 TABLET BY MOUTH DAILY 12/01/19  Yes  Campbell Riches, MD  clopidogrel (PLAVIX) 75 MG tablet Take 1 tablet (75 mg total) by mouth daily. 12/04/19  Yes Agyei, Caprice Kluver, MD  lidocaine (LIDODERM) 5 % Place 1 patch onto the skin daily as needed (flank pain). Remove & Discard patch within 12 hours or as directed by MD   Yes [provider]  losartan (COZAAR) 100 MG tablet TAKE  1 TABLET(100 MG) BY MOUTH DAILY 04/30/19  Yes Jettie Booze, MD  metFORMIN (GLUCOPHAGE) 1000 MG tablet TAKE 1 TABLET BY MOUTH TWICE DAILY WITH A MEAL 12/01/19  Yes Agyei, Caprice Kluver, MD  metoprolol succinate (TOPROL-XL) 100 MG 24 hr tablet TAKE 1 TABLET BY MOUTH DAILY WITH OR IMMEDAITELY FOLLOWING A MEAL 10/21/19  Yes Agyei, Caprice Kluver, MD  Multiple Vitamin (MULTIVITAMIN WITH MINERALS) TABS tablet Take 1 tablet by mouth daily.   Yes [provider]  nitroGLYCERIN (NITROSTAT) 0.4 MG SL tablet Place 1 tablet (0.4 mg total) under the tongue every 5 (five) minutes as needed for chest pain. 11/17/15  Yes Bensimhon, Shaune Pascal, MD  spironolactone (ALDACTONE) 25 MG tablet Take 1 tablet (25 mg total) by mouth daily. Need to make appt with provider for further refills - 1st attempt 02/25/20  Yes Jettie Booze, MD  blood glucose meter kit and supplies KIT Dispense based on patient and insurance preference. Use up to four times daily as directed. (FOR ICD-9 250.00, 250.01). Patient not taking: Reported on 07/11/2019 08/03/15   Bethena Roys, MD  Blood Glucose Monitoring Suppl ([place]) w/Device KIT 1 each by Does not apply route daily. Patient not taking: Reported on 07/11/2019 12/22/15   Collier Salina, MD  Continuous Blood Gluc Receiver (FREESTYLE LIBRE 2 READER SYSTM) DEVI 1 Device by Does not apply route 3 (three) times daily. Patient not taking: Reported on 07/11/2019 01/08/19   Jean Rosenthal, MD  Continuous Blood Gluc Sensor (FREESTYLE LIBRE 2 SENSOR SYSTM) MISC 1 Device by Does not apply route 3 (three) times daily. Patient not taking: Reported on 07/11/2019 01/08/19   Jean Rosenthal, MD  glucose blood (ONETOUCH VERIO) test strip Check blood sugar up to 1 time a day Patient not taking: Reported on 07/11/2019 12/22/15   Collier Salina, MD  [facility] DELICA LANCETS FINE MISC Check blood sugar up to 1 time a day Patient not taking: Reported on 07/11/2019 12/22/15   Collier Salina, MD     Family History  Problem Relation Age of Onset   Diabetes Mother    Cancer Mother        unkown type   Hypertension Father    Colon cancer Neg Hx     Social History   Socioeconomic History   Marital status: Legally Separated    Spouse name: Not on file   Number of children: Not on file   Years of education: Not on file   Highest education level: Not on file  Occupational History   Not on file  Tobacco Use   Smoking status: Former Smoker    Packs/day: 0.12    Years: 49.00    Pack years: 5.88    Types: Cigarettes    Start date: 07/20/2012    Quit date: 02/18/2015    Years since quitting: 5.0   Smokeless tobacco: Never Used  Vaping Use   Vaping Use: Never used  Substance and Sexual Activity   Alcohol use: Yes    Alcohol/week: 6.0 standard drinks    Types:  6 Standard drinks or equivalent per week   Drug use: No   Sexual activity: Yes    Partners: Female    Comment: pt. given condoms  Other Topics Concern   Not on file  Social History Narrative   Not on file   Social Determinants of Health   Financial Resource Strain:    Difficulty of Paying Living Expenses: Not on file  Food Insecurity:    Worried About Charity fundraiser in the Last Year: Not on file   [employer] of Food in the Last Year: Not on file  Transportation Needs:    Lack of Transportation (Medical): Not on file   Lack of Transportation (Non-Medical): Not on file  Physical Activity:    Days of Exercise per Week: Not on file   Minutes of Exercise per Session: Not on file  Stress:    Feeling of Stress : Not on file  Social Connections:    Frequency of Communication with Friends and Family: Not on file   Frequency of Social Gatherings with Friends and Family: Not on file   Attends Religious Services: Not on file   Active Member of Clubs or Organizations: Not on file   Attends Archivist Meetings: Not on file   Marital Status: Not on file      Review of Systems: A 12 point ROS discussed and pertinent positives are indicated in the HPI above.  All other systems are negative.  Review of Systems  Constitutional: Negative for chills and fever.  Respiratory: Negative for shortness of breath and wheezing.   Cardiovascular: Negative for chest pain and palpitations.  Gastrointestinal: Positive for abdominal pain. Negative for nausea and vomiting.  Neurological: Negative for headaches.  Psychiatric/Behavioral: Negative for behavioral problems and confusion.    Vital Signs: BP 135/79 (BP Location: Right Arm)    Pulse 84    Temp 98.6 F (37 C) (Oral)    Resp 16    SpO2 95%   Physical Exam Vitals and nursing note reviewed.  Constitutional:      General: He is not in acute distress.    Appearance: Normal appearance.  Cardiovascular:     Rate and Rhythm: Normal rate and regular rhythm.     Heart sounds: Normal heart sounds. No murmur heard.   Pulmonary:     Effort: Pulmonary effort is normal. No respiratory distress.     Breath sounds: Normal breath sounds. No wheezing.  Abdominal:     Palpations: Abdomen is soft.     Tenderness: There is no guarding.     Comments: (+) RUQ tenderness.  Skin:    General: Skin is warm and dry.  Neurological:     Mental Status: He is alert and oriented to person, place, and time.      MD Evaluation Airway: WNL Heart: WNL Abdomen: WNL Chest/ Lungs: WNL ASA  Classification: 3 Mallampati/Airway Score: Two   Imaging: CT Abdomen Pelvis Wo Contrast  Result Date: 03/05/2020 CLINICAL DATA:  RIGHT-sided abdominal pain for 2 weeks. EXAM: CT ABDOMEN AND PELVIS WITHOUT CONTRAST TECHNIQUE: Multidetector CT imaging of the abdomen and pelvis was performed following the standard protocol without IV contrast. COMPARISON:  March 03, 2020 FINDINGS: Lower chest: Coronary artery atherosclerotic calcifications. Scattered bibasilar atelectasis. Gynecomastia. Hepatobiliary: The gallbladder is  distended with a thickened wall and adjacent fat stranding. No definitive intrahepatic biliary ductal dilation on this noncontrast examination. No definitive choledocholithiasis visualized on this noncontrast examination. Focal fatty deposition adjacent to the  falciform ligament. Pancreas: Unremarkable noncontrast appearance of the pancreas. Spleen: Normal in size without focal abnormality. Adrenals/Urinary Tract: Bilateral adrenal glands are unremarkable. No nephrolithiasis. No hydronephrosis. Bladder is unremarkable. Stomach/Bowel: Stomach is within normal limits. Appendix appears normal. No evidence of bowel wall thickening, distention, or inflammatory changes. Vascular/Lymphatic: Moderate to severe aortic atherosclerotic calcifications. No suspicious lymphadenopathy. Reproductive: Prostate is unremarkable. Other: Fat containing umbilical hernia. Musculoskeletal: Degenerative changes of the lumbar spine. IMPRESSION: 1. Constellation of findings are consistent with acute cholecystitis. No definitive radiopaque choledocholithiasis visualized. Aortic Atherosclerosis (ICD10-I70.0). These results will be called to the ordering clinician or representative by the Radiologist Assistant, and communication documented in the PACS or [employer]. Electronically Signed   By: Valentino Saxon MD   On: 03/05/2020 13:00   DG Abd 1 View  Result Date: 02/19/2020 CLINICAL DATA:  Abdominal pain EXAM: ABDOMEN - 1 VIEW COMPARISON:  05/25/2017 abdominal ultrasound FINDINGS: Nonobstructive bowel-gas pattern. No abnormal abdominal calcifications. No appendicolith. Vascular calcifications within the pelvis. IMPRESSION: No acute findings. Electronically Signed   By: Abigail Miyamoto M.D.   On: 02/19/2020 09:35   US Abdomen Limited RUQ  Result Date: 03/03/2020 CLINICAL DATA:  Right upper quadrant abdominal pain for 3 days. EXAM: ULTRASOUND ABDOMEN LIMITED RIGHT UPPER QUADRANT COMPARISON:  Prior ultrasound examination from 2018.  FINDINGS: Gallbladder: Gallbladder distension is noted. Gallbladder contains a significant amount echogenic sludge. I do not see any definite echogenic shadowing gallstones. No significant gallbladder wall thickening or pericholecystic fluid and no sonographic Murphy sign to suggest acute cholecystitis. Common bile duct: Diameter: 6.0 mm Liver: Normal echogenicity without focal lesion or biliary dilatation. Portal vein is patent on color Doppler imaging with normal direction of blood flow towards the liver. Other: None. IMPRESSION: 1. Distended gallbladder with a significant amount of echogenic sludge. No definite gallstones or findings for acute cholecystitis. 2. Normal caliber common bile duct. 3. Unremarkable sonographic appearance of the liver. Electronically Signed   By: Marijo Sanes M.D.   On: 03/03/2020 10:19    Labs:  CBC: Recent Labs    02/19/20 1009 03/03/20 0922 03/04/20 0941 03/05/20 1457  WBC 9.9 12.0* 14.1* 12.7*  HGB 13.2 12.4* 12.1* 11.9*  HCT 37.7* 36.4* 35.6* 36.4*  PLT 419* 516* 545* 486*    COAGS: No results for input(s): INR, APTT in the last 8760 hours.  BMP: Recent Labs    02/19/20 1009 03/03/20 0922 03/04/20 0941 03/05/20 1457  NA 137 136 132* 136  K 4.9 5.5* 4.7 5.0  CL 101 99 95* 100  CO2 '25 21 21 25  ' GLUCOSE 125* 122* 147* 97  BUN 24* '16 18 20  ' CALCIUM 10.2 10.1 9.6 9.7  CREATININE 1.45* 1.28* 1.36* 1.63*  GFRNONAA 52* 60 56* 45*  GFRAA 60* 69 64 52*    LIVER FUNCTION TESTS: Recent Labs    05/09/19 0901 02/19/20 1009 03/03/20 0922 03/05/20 1457  BILITOT 0.3 0.7 0.3 0.6  AST '19 19 17 24  ' ALT '22 20 17 24  ' ALKPHOS  --  78 125* 114  PROT 7.3 7.8 7.4 7.7  ALBUMIN  --  3.9 4.1 3.1*     Assessment and Plan:  Acute cholecystitis. Plan for image-guided percutaneous cholecystostomy tube placement today in IR. Patient is NPO. Afebrile. Ok to proceed with Plavix use per Dr. Pascal Lux. INR pending.  Risks and benefits were discussed with the  patient including, but not limited to, bleeding, infection, gallbladder perforation, bile leak, sepsis or even death. All of the patient's  questions were answered, patient is agreeable to proceed. Consent signed and in IR control room.   Thank you for this interesting consult.  I greatly enjoyed meeting John Dickerson and look forward to participating in their care.  A copy of this report was sent to the requesting provider on this date.  Electronically Signed: Earley Abide, PA-C 03/06/2020, 9:33 AM   I spent a total of 40 Minutes in face to face in clinical consultation, greater than 50% of which was counseling/coordinating care for acute cholecystitis/cholecystostomy tube placement.

## 2020-03-06 NOTE — ED Notes (Signed)
ED Provider at bedside. 

## 2020-03-06 NOTE — ED Notes (Signed)
Patient remains in IR

## 2020-03-07 DIAGNOSIS — Z7984 Long term (current) use of oral hypoglycemic drugs: Secondary | ICD-10-CM

## 2020-03-07 DIAGNOSIS — K810 Acute cholecystitis: Principal | ICD-10-CM

## 2020-03-07 DIAGNOSIS — E1159 Type 2 diabetes mellitus with other circulatory complications: Secondary | ICD-10-CM

## 2020-03-07 DIAGNOSIS — R1905 Periumbilic swelling, mass or lump: Secondary | ICD-10-CM

## 2020-03-07 DIAGNOSIS — I129 Hypertensive chronic kidney disease with stage 1 through stage 4 chronic kidney disease, or unspecified chronic kidney disease: Secondary | ICD-10-CM

## 2020-03-07 DIAGNOSIS — Z955 Presence of coronary angioplasty implant and graft: Secondary | ICD-10-CM

## 2020-03-07 DIAGNOSIS — I2510 Atherosclerotic heart disease of native coronary artery without angina pectoris: Secondary | ICD-10-CM

## 2020-03-07 DIAGNOSIS — E1122 Type 2 diabetes mellitus with diabetic chronic kidney disease: Secondary | ICD-10-CM

## 2020-03-07 DIAGNOSIS — B20 Human immunodeficiency virus [HIV] disease: Secondary | ICD-10-CM

## 2020-03-07 DIAGNOSIS — N179 Acute kidney failure, unspecified: Secondary | ICD-10-CM

## 2020-03-07 DIAGNOSIS — N189 Chronic kidney disease, unspecified: Secondary | ICD-10-CM

## 2020-03-07 DIAGNOSIS — B182 Chronic viral hepatitis C: Secondary | ICD-10-CM

## 2020-03-07 LAB — COMPREHENSIVE METABOLIC PANEL
ALT: 109 U/L — ABNORMAL HIGH (ref 0–44)
AST: 100 U/L — ABNORMAL HIGH (ref 15–41)
Albumin: 2.5 g/dL — ABNORMAL LOW (ref 3.5–5.0)
Alkaline Phosphatase: 435 U/L — ABNORMAL HIGH (ref 38–126)
Anion gap: 12 (ref 5–15)
BUN: 14 mg/dL (ref 8–23)
CO2: 22 mmol/L (ref 22–32)
Calcium: 9.2 mg/dL (ref 8.9–10.3)
Chloride: 98 mmol/L (ref 98–111)
Creatinine, Ser: 1.38 mg/dL — ABNORMAL HIGH (ref 0.61–1.24)
GFR calc Af Amer: >60 mL/min (ref >60)
GFR calc non Af Amer: 55 mL/min — ABNORMAL LOW (ref >60)
Glucose, Bld: 113 mg/dL — ABNORMAL HIGH (ref 70–99)
Potassium: 4.8 mmol/L (ref 3.5–5.1)
Sodium: 132 mmol/L — ABNORMAL LOW (ref 135–145)
Total Bilirubin: 1.3 mg/dL — ABNORMAL HIGH (ref 0.3–1.2)
Total Protein: 7.1 g/dL (ref 6.5–8.1)

## 2020-03-07 LAB — GLUCOSE, CAPILLARY
Glucose-Capillary: 102 mg/dL — ABNORMAL HIGH (ref 70–99)
Glucose-Capillary: 103 mg/dL — ABNORMAL HIGH (ref 70–99)
Glucose-Capillary: 110 mg/dL — ABNORMAL HIGH (ref 70–99)
Glucose-Capillary: 122 mg/dL — ABNORMAL HIGH (ref 70–99)
Glucose-Capillary: 123 mg/dL — ABNORMAL HIGH (ref 70–99)
Glucose-Capillary: 131 mg/dL — ABNORMAL HIGH (ref 70–99)
Glucose-Capillary: 98 mg/dL (ref 70–99)

## 2020-03-07 LAB — CBC
HCT: 35.8 % — ABNORMAL LOW (ref 39.0–52.0)
Hemoglobin: 11.9 g/dL — ABNORMAL LOW (ref 13.0–17.0)
MCH: 31.6 pg (ref 26.0–34.0)
MCHC: 33.2 g/dL (ref 30.0–36.0)
MCV: 95 fL (ref 80.0–100.0)
Platelets: 483 10*3/uL — ABNORMAL HIGH (ref 150–400)
RBC: 3.77 MIL/uL — ABNORMAL LOW (ref 4.22–5.81)
RDW: 11.9 % (ref 11.5–15.5)
WBC: 12.7 10*3/uL — ABNORMAL HIGH (ref 4.0–10.5)
nRBC: 0 % (ref 0.0–0.2)

## 2020-03-07 LAB — PROTIME-INR
INR: 1.2 (ref 0.8–1.2)
Prothrombin Time: 14.7 seconds (ref 11.4–15.2)

## 2020-03-07 MED ORDER — SODIUM CHLORIDE 0.9 % IV SOLN
INTRAVENOUS | Status: DC | PRN
  Administered 2020-03-07: 250 mL via INTRAVENOUS

## 2020-03-07 MED ORDER — PIPERACILLIN-TAZOBACTAM 3.375 G IVPB
3.3750 g | Freq: Three times a day (TID) | INTRAVENOUS | Status: DC
  Administered 2020-03-07 – 2020-03-08 (×4): 3.375 g via INTRAVENOUS
  Filled 2020-03-07 (×3): qty 50

## 2020-03-07 NOTE — Progress Notes (Signed)
Subjective/Chief Complaint: RUQ pain improving   Objective: Vital signs in last 24 hours: Temp:  [98.6 F (37 C)-99.8 F (37.7 C)] 99.8 F (37.7 C) (09/19 0426) Pulse Rate:  [72-84] 76 (09/19 0426) Resp:  [13-20] 17 (09/19 0426) BP: (117-144)/(65-88) 130/74 (09/19 0426) SpO2:  [93 %-100 %] 96 % (09/19 0426) Weight:  [78.2 kg] 78.2 kg (09/19 0031) Last BM Date: 03/02/20  Intake/Output from previous day: 09/18 0701 - 09/19 0700 In: 250 [IV Piggyback:100] Out: 300 [Urine:300] Intake/Output this shift: No intake/output data recorded.  General appearance: cooperative Resp: clear to auscultation bilaterally Cardio: regular rate and rhythm GI: soft, less tender RUQ, drain functional, chronic UH  Lab Results:  Recent Labs    03/04/20 0941 03/05/20 1457  WBC 14.1* 12.7*  HGB 12.1* 11.9*  HCT 35.6* 36.4*  PLT 545* 486*   BMET Recent Labs    03/04/20 0941 03/05/20 1457  NA 132* 136  K 4.7 5.0  CL 95* 100  CO2 21 25  GLUCOSE 147* 97  BUN 18 20  CREATININE 1.36* 1.63*  CALCIUM 9.6 9.7   PT/INR Recent Labs    03/06/20 1307  LABPROT 13.2  INR 1.0   ABG No results for input(s): PHART, HCO3 in the last 72 hours.  Invalid input(s): PCO2, PO2  Studies/Results: CT Abdomen Pelvis Wo Contrast  Result Date: 03/05/2020 CLINICAL DATA:  RIGHT-sided abdominal pain for 2 weeks. EXAM: CT ABDOMEN AND PELVIS WITHOUT CONTRAST TECHNIQUE: Multidetector CT imaging of the abdomen and pelvis was performed following the standard protocol without IV contrast. COMPARISON:  March 03, 2020 FINDINGS: Lower chest: Coronary artery atherosclerotic calcifications. Scattered bibasilar atelectasis. Gynecomastia. Hepatobiliary: The gallbladder is distended with a thickened wall and adjacent fat stranding. No definitive intrahepatic biliary ductal dilation on this noncontrast examination. No definitive choledocholithiasis visualized on this noncontrast examination. Focal fatty deposition  adjacent to the falciform ligament. Pancreas: Unremarkable noncontrast appearance of the pancreas. Spleen: Normal in size without focal abnormality. Adrenals/Urinary Tract: Bilateral adrenal glands are unremarkable. No nephrolithiasis. No hydronephrosis. Bladder is unremarkable. Stomach/Bowel: Stomach is within normal limits. Appendix appears normal. No evidence of bowel wall thickening, distention, or inflammatory changes. Vascular/Lymphatic: Moderate to severe aortic atherosclerotic calcifications. No suspicious lymphadenopathy. Reproductive: Prostate is unremarkable. Other: Fat containing umbilical hernia. Musculoskeletal: Degenerative changes of the lumbar spine. IMPRESSION: 1. Constellation of findings are consistent with acute cholecystitis. No definitive radiopaque choledocholithiasis visualized. Aortic Atherosclerosis (ICD10-I70.0). These results will be called to the ordering clinician or representative by the Radiologist Assistant, and communication documented in the PACS or [employer]. Electronically Signed   By: Valentino Saxon MD   On: 03/05/2020 13:00   IR Perc Cholecystostomy  Result Date: 03/06/2020 INDICATION: Acute cholecystitis. Poor operative candidate due to currently being on Plavix. Please perform image guided cholecystostomy tube placement for infection source control purposes. EXAM: ULTRASOUND AND FLUOROSCOPIC-GUIDED CHOLECYSTOSTOMY TUBE PLACEMENT COMPARISON:  None. MEDICATIONS: The patient is currently admitted to the hospital and on intravenous antibiotics. Antibiotics were administered within an appropriate time frame prior to skin puncture. ANESTHESIA/SEDATION: Moderate (conscious) sedation was employed during this procedure. A total of Versed 2 mg and Fentanyl 150 mcg was administered intravenously. Moderate Sedation Time: 16 minutes. The patient's level of consciousness and vital signs were monitored continuously by radiology nursing throughout the procedure under my  direct supervision. CONTRAST:  105mL OMNIPAQUE IOHEXOL 300 MG/ML SOLN - administered into the gallbladder fossa. FLUOROSCOPY TIME:  48 seconds (18 mGy) COMPLICATIONS: None immediate. PROCEDURE: Informed written consent was  obtained from the patient after a discussion of the risks, benefits and alternatives to treatment. Questions regarding the procedure were encouraged and answered. A timeout was performed prior to the initiation of the procedure. The right upper abdominal quadrant was prepped and draped in the usual sterile fashion, and a sterile drape was applied covering the operative field. Maximum barrier sterile technique with sterile gowns and gloves were used for the procedure. A timeout was performed prior to the initiation of the procedure. Local anesthesia was provided with 1% lidocaine with epinephrine. Ultrasound scanning of the right upper quadrant demonstrates a markedly dilated gallbladder. Of note, the patient reported pain with ultrasound imaging over the gallbladder. Utilizing a transhepatic approach, a 22 gauge needle was advanced into the gallbladder under direct ultrasound guidance. An ultrasound image was saved for documentation purposes. Appropriate intraluminal puncture was confirmed with the efflux of bile and advancement of an 0.018 wire into the gallbladder lumen. The needle was exchanged for an [place] set. A small amount of contrast was injected to confirm appropriate intraluminal positioning. Over a [place] wire, a 69.2-French Cook cholecystomy tube was advanced into the gallbladder fossa, coiled and locked. Small amount of purulent appearing bile was aspirated, capped and sent to the laboratory for analysis. A small amount of contrast was injected as several post procedural spot radiographic images were obtained in various obliquities. The catheter was secured to the skin with suture, connected to a drainage bag and a dressing was placed. The patient tolerated the procedure well  without immediate post procedural complication. IMPRESSION: Successful ultrasound and fluoroscopic guided placement of a 10.2 French cholecystostomy tube. Small amount of aspirated bile was capped and sent to the laboratory for analysis. Electronically Signed   By: Sandi Mariscal M.D.   On: 03/06/2020 12:34    Anti-infectives: Anti-infectives (From admission, onward)   Start     Dose/Rate Route Frequency Ordered Stop   03/06/20 1200  piperacillin-tazobactam (ZOSYN) IVPB 3.375 g        3.375 g 100 mL/hr over 30 Minutes Intravenous Every 6 hours 03/06/20 1100 03/13/20 1159   03/06/20 1200  bictegravir-emtricitabine-tenofovir AF (BIKTARVY) 50-200-25 MG per tablet 1 tablet        1 tablet Oral Daily 03/06/20 1128     03/06/20 0515  piperacillin-tazobactam (ZOSYN) IVPB 3.375 g        3.375 g 100 mL/hr over 30 Minutes Intravenous  Once 03/06/20 0501 03/06/20 0552      Assessment/Plan: Cholecystitis - S/P perc chole tube by IR 9/18. Continue ABX per primary. Recommend clears and advance diet to heart healthy as tolerated. Dr. Redmond Pulling will be seeing him in F/U at [place] after discharge.  LOS: 1 day    Zenovia Jarred 03/07/2020

## 2020-03-07 NOTE — Progress Notes (Addendum)
Subjective:   Patient interviewed at bedside. States that his pain is controlled, but still present. Feels somewhat better after gallbladder was drained yesterday. Denies CP, n/v, SOB. He wants to eat. No BM or gas yet.  Objective:  Vital signs in last 24 hours: Vitals:   03/06/20 1734 03/06/20 2010 03/07/20 0031 03/07/20 0426  BP: (!) 144/79 127/75 (!) 143/70 130/74  Pulse: 74 78 81 76  Resp: 16 17 16 17   Temp: 99.4 F (37.4 C) 99.6 F (37.6 C) 99.5 F (37.5 C) 99.8 F (37.7 C)  TempSrc: Oral Oral Oral Oral  SpO2: 100% 100% 100% 96%  Weight:   78.2 kg     Physical Exam Constitutional: no acute distress Head: atraumatic ENT: external ears normal Eyes: EOMI Cardiovascular: regular rate and rhythm, normal heart sounds Pulmonary: effort normal, lungs clear to ascultation bilaterally Abdominal: moderately distended, severely tender in RUQ and umbilicus which is protruding, not dusky, unable to reduce due to pain, rest of abdomen is diffusely mildly tender, decreased bowel sounds, drain RUQ with sanguinous bile Skin: warm and dry Neurological: alert, no focal deficit Psychiatric: normal mood and affect  Assessment/Plan: ADEMIDE SCHABERG is a 59 y.o. male with  hx of type II DM, HTN, HIV, HLD, hep C, CAD s/p stents presenting with acute acalculous cholecystitis, s/p percutaneous drainage by IR 9/18.   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 CT consistent with acute  cholecystitis, no definite stones seen. Hemodynamically stable. Abdomen still quite tender. Umbilicus is protuberant and very tender, reports this has only been since his Sx started. Too painful to reduce, will perform additional exam when able to tolerate more.  -surgery and IR following -start clear liquid diet, advance as tolerated to heart healthy -continue Zosyn, day 2 -maintenance NS -zofran prn, no nausea currently -f/u with IR outpatient   AKI on CKD Baseline 1.2-1.3, presenting with creatinine of 1.63. Likely pre-renal -maintenance NS, clear liquid diet started -Holding home losaratan and spironolactone. Can restart with renal function recovers. -daily BMP   CAD s/p DES stents in 2017 Reports angina in 2017, had a cath with severe LAD disease, DES stents to mid LAD and distal LAD. On ASA and Plavix at home. Last cardiology visit September 2020. No echo on file.  -no need for Plavix this far out from his stents, will discontinue -resume home ASA and lipitor 40mg  -continue home metoprolol succinate 100mg  daily   HIV Stable on Biktarvy daily. Last testing in November 2020 with quant RNA <20, CD4 count 634. -continue Biktarvy   Essential HTN On amlodipine 10mg , losartan 100mg , Toprol 100mg , spironolactone 25mg  at home. BP 130-150/70s this admission. -resume home BP meds after surgery   DM Last A1c of 6.4 on 03/03/20. Glucose 102 this morning. Well controlled on metformin 1000mg  BID at home.  -SSI  Diet: clear liquids, advance as tolerated IVF: NS VTE: SCDs Prior to Admission Living Arrangement: home Anticipated Discharge Location: home Barriers to Discharge: bowel recovery, PO intake Dispo: Anticipated discharge in approximately 1-2 day(s).  Andrew Au, MD 03/07/2020, 6:13 AM Pager: [phone] After 5pm on weekdays and 1pm on weekends: On Call pager [phone]

## 2020-03-07 NOTE — Progress Notes (Signed)
Referring Physician(s): Greer Pickerel ([place])  Supervising Physician: Sandi Mariscal  Patient Status:  [facility] - In-pt  Chief Complaint: "Pain at drain"  Subjective:  History of acute cholecystitis s/p percutaneous cholecystostomy tube placement in IR 03/06/2020. Patient awake and alert laying in bed watching TV. Complains of pain at drain insertion site, as expected. Otherwise RUQ pain has improved since yesterday. Cholecystostomy tube site c/d/i.   Allergies: Patient has no known allergies.  Medications: Prior to Admission medications   Medication Sig Start Date End Date Taking? Authorizing Provider  acetaminophen (TYLENOL) 500 MG tablet Take 2 tablets (1,000 mg total) by mouth every 6 (six) hours as needed for moderate pain. 08/29/19  Yes Darr, Marguerita Beards, PA-C  allopurinol (ZYLOPRIM) 100 MG tablet TAKE 1 AND 1/2 TABLETS(150 MG) BY MOUTH DAILY Patient taking differently: Take 150 mg by mouth daily.  07/28/19  Yes Agyei, Caprice Kluver, MD  amLODipine (NORVASC) 10 MG tablet Take 1 tablet (10 mg total) by mouth daily. 11/03/19  Yes Axel Filler, MD  aspirin EC 81 MG tablet Take 1 tablet (81 mg total) by mouth daily. 11/17/15  Yes Bensimhon, Shaune Pascal, MD  atorvastatin (LIPITOR) 40 MG tablet Take 1 tablet (40 mg total) by mouth daily at 6 PM. 03/21/19  Yes Jettie Booze, MD  BIKTARVY 50-200-25 MG TABS tablet TAKE 1 TABLET BY MOUTH DAILY 12/01/19  Yes Campbell Riches, MD  clopidogrel (PLAVIX) 75 MG tablet Take 1 tablet (75 mg total) by mouth daily. 12/04/19  Yes Agyei, Caprice Kluver, MD  lidocaine (LIDODERM) 5 % Place 1 patch onto the skin daily as needed (flank pain). Remove & Discard patch within 12 hours or as directed by MD   Yes [provider]  losartan (COZAAR) 100 MG tablet TAKE 1 TABLET(100 MG) BY MOUTH DAILY 04/30/19  Yes Jettie Booze, MD  metFORMIN (GLUCOPHAGE) 1000 MG tablet TAKE 1 TABLET BY MOUTH TWICE DAILY WITH A MEAL 12/01/19  Yes Agyei, Caprice Kluver, MD  metoprolol  succinate (TOPROL-XL) 100 MG 24 hr tablet TAKE 1 TABLET BY MOUTH DAILY WITH OR IMMEDAITELY FOLLOWING A MEAL 10/21/19  Yes Agyei, Caprice Kluver, MD  Multiple Vitamin (MULTIVITAMIN WITH MINERALS) TABS tablet Take 1 tablet by mouth daily.   Yes [provider]  nitroGLYCERIN (NITROSTAT) 0.4 MG SL tablet Place 1 tablet (0.4 mg total) under the tongue every 5 (five) minutes as needed for chest pain. 11/17/15  Yes Bensimhon, Shaune Pascal, MD  spironolactone (ALDACTONE) 25 MG tablet Take 1 tablet (25 mg total) by mouth daily. Need to make appt with provider for further refills - 1st attempt 02/25/20  Yes Jettie Booze, MD  blood glucose meter kit and supplies KIT Dispense based on patient and insurance preference. Use up to four times daily as directed. (FOR ICD-9 250.00, 250.01). Patient not taking: Reported on 07/11/2019 08/03/15   Bethena Roys, MD  Blood Glucose Monitoring Suppl ([place]) w/Device KIT 1 each by Does not apply route daily. Patient not taking: Reported on 07/11/2019 12/22/15   Collier Salina, MD  Continuous Blood Gluc Receiver (FREESTYLE LIBRE 2 READER SYSTM) DEVI 1 Device by Does not apply route 3 (three) times daily. Patient not taking: Reported on 07/11/2019 01/08/19   Jean Rosenthal, MD  Continuous Blood Gluc Sensor (FREESTYLE LIBRE 2 SENSOR SYSTM) MISC 1 Device by Does not apply route 3 (three) times daily. Patient not taking: Reported on 07/11/2019 01/08/19   Jean Rosenthal, MD  glucose blood (ONETOUCH VERIO) test strip Check blood sugar up to 1 time a day Patient not taking: Reported on 07/11/2019 12/22/15   Collier Salina, MD  [facility] DELICA LANCETS FINE MISC Check blood sugar up to 1 time a day Patient not taking: Reported on 07/11/2019 12/22/15   Collier Salina, MD     Vital Signs: BP 130/74 (BP Location: Left Arm)   Pulse 76   Temp 99.8 F (37.7 C) (Oral)   Resp 17   Wt 172 lb 6.4 oz (78.2 kg)   SpO2 96%   BMI 30.54 kg/m   Physical  Exam Vitals and nursing note reviewed.  Constitutional:      General: He is not in acute distress. Pulmonary:     Effort: Pulmonary effort is normal. No respiratory distress.  Abdominal:     Comments: (+) RUQ tenderness. Cholecystostomy tube site with mild tenderness, no erythema, drainage, or active bleeding; approximately 75 cc blood-tinged bile with debris in gravity bag.  Skin:    General: Skin is warm and dry.  Neurological:     Mental Status: He is alert and oriented to person, place, and time.     Imaging: CT Abdomen Pelvis Wo Contrast  Result Date: 03/05/2020 CLINICAL DATA:  RIGHT-sided abdominal pain for 2 weeks. EXAM: CT ABDOMEN AND PELVIS WITHOUT CONTRAST TECHNIQUE: Multidetector CT imaging of the abdomen and pelvis was performed following the standard protocol without IV contrast. COMPARISON:  March 03, 2020 FINDINGS: Lower chest: Coronary artery atherosclerotic calcifications. Scattered bibasilar atelectasis. Gynecomastia. Hepatobiliary: The gallbladder is distended with a thickened wall and adjacent fat stranding. No definitive intrahepatic biliary ductal dilation on this noncontrast examination. No definitive choledocholithiasis visualized on this noncontrast examination. Focal fatty deposition adjacent to the falciform ligament. Pancreas: Unremarkable noncontrast appearance of the pancreas. Spleen: Normal in size without focal abnormality. Adrenals/Urinary Tract: Bilateral adrenal glands are unremarkable. No nephrolithiasis. No hydronephrosis. Bladder is unremarkable. Stomach/Bowel: Stomach is within normal limits. Appendix appears normal. No evidence of bowel wall thickening, distention, or inflammatory changes. Vascular/Lymphatic: Moderate to severe aortic atherosclerotic calcifications. No suspicious lymphadenopathy. Reproductive: Prostate is unremarkable. Other: Fat containing umbilical hernia. Musculoskeletal: Degenerative changes of the lumbar spine. IMPRESSION: 1.  Constellation of findings are consistent with acute cholecystitis. No definitive radiopaque choledocholithiasis visualized. Aortic Atherosclerosis (ICD10-I70.0). These results will be called to the ordering clinician or representative by the Radiologist Assistant, and communication documented in the PACS or [employer]. Electronically Signed   By: Valentino Saxon MD   On: 03/05/2020 13:00   IR Perc Cholecystostomy  Result Date: 03/06/2020 INDICATION: Acute cholecystitis. Poor operative candidate due to currently being on Plavix. Please perform image guided cholecystostomy tube placement for infection source control purposes. EXAM: ULTRASOUND AND FLUOROSCOPIC-GUIDED CHOLECYSTOSTOMY TUBE PLACEMENT COMPARISON:  None. MEDICATIONS: The patient is currently admitted to the hospital and on intravenous antibiotics. Antibiotics were administered within an appropriate time frame prior to skin puncture. ANESTHESIA/SEDATION: Moderate (conscious) sedation was employed during this procedure. A total of Versed 2 mg and Fentanyl 150 mcg was administered intravenously. Moderate Sedation Time: 16 minutes. The patient's level of consciousness and vital signs were monitored continuously by radiology nursing throughout the procedure under my direct supervision. CONTRAST:  54m OMNIPAQUE IOHEXOL 300 MG/ML SOLN - administered into the gallbladder fossa. FLUOROSCOPY TIME:  48 seconds (18 mGy) COMPLICATIONS: None immediate. PROCEDURE: Informed written consent was obtained from the patient after a discussion of the risks, benefits and alternatives to treatment. Questions regarding the procedure were encouraged and  answered. A timeout was performed prior to the initiation of the procedure. The right upper abdominal quadrant was prepped and draped in the usual sterile fashion, and a sterile drape was applied covering the operative field. Maximum barrier sterile technique with sterile gowns and gloves were used for the procedure. A  timeout was performed prior to the initiation of the procedure. Local anesthesia was provided with 1% lidocaine with epinephrine. Ultrasound scanning of the right upper quadrant demonstrates a markedly dilated gallbladder. Of note, the patient reported pain with ultrasound imaging over the gallbladder. Utilizing a transhepatic approach, a 22 gauge needle was advanced into the gallbladder under direct ultrasound guidance. An ultrasound image was saved for documentation purposes. Appropriate intraluminal puncture was confirmed with the efflux of bile and advancement of an 0.018 wire into the gallbladder lumen. The needle was exchanged for an [place] set. A small amount of contrast was injected to confirm appropriate intraluminal positioning. Over a Benson wire, a 88.2-French Cook cholecystomy tube was advanced into the gallbladder fossa, coiled and locked. Small amount of purulent appearing bile was aspirated, capped and sent to the laboratory for analysis. A small amount of contrast was injected as several post procedural spot radiographic images were obtained in various obliquities. The catheter was secured to the skin with suture, connected to a drainage bag and a dressing was placed. The patient tolerated the procedure well without immediate post procedural complication. IMPRESSION: Successful ultrasound and fluoroscopic guided placement of a 10.2 French cholecystostomy tube. Small amount of aspirated bile was capped and sent to the laboratory for analysis. Electronically Signed   By: Sandi Mariscal M.D.   On: 03/06/2020 12:34   US Abdomen Limited RUQ  Result Date: 03/03/2020 CLINICAL DATA:  Right upper quadrant abdominal pain for 3 days. EXAM: ULTRASOUND ABDOMEN LIMITED RIGHT UPPER QUADRANT COMPARISON:  Prior ultrasound examination from 2018. FINDINGS: Gallbladder: Gallbladder distension is noted. Gallbladder contains a significant amount echogenic sludge. I do not see any definite echogenic shadowing  gallstones. No significant gallbladder wall thickening or pericholecystic fluid and no sonographic Murphy sign to suggest acute cholecystitis. Common bile duct: Diameter: 6.0 mm Liver: Normal echogenicity without focal lesion or biliary dilatation. Portal vein is patent on color Doppler imaging with normal direction of blood flow towards the liver. Other: None. IMPRESSION: 1. Distended gallbladder with a significant amount of echogenic sludge. No definite gallstones or findings for acute cholecystitis. 2. Normal caliber common bile duct. 3. Unremarkable sonographic appearance of the liver. Electronically Signed   By: Marijo Sanes M.D.   On: 03/03/2020 10:19    Labs:  CBC: Recent Labs    02/19/20 1009 03/03/20 0922 03/04/20 0941 03/05/20 1457  WBC 9.9 12.0* 14.1* 12.7*  HGB 13.2 12.4* 12.1* 11.9*  HCT 37.7* 36.4* 35.6* 36.4*  PLT 419* 516* 545* 486*    COAGS: Recent Labs    03/06/20 1307  INR 1.0    BMP: Recent Labs    02/19/20 1009 03/03/20 0922 03/04/20 0941 03/05/20 1457  NA 137 136 132* 136  K 4.9 5.5* 4.7 5.0  CL 101 99 95* 100  CO2 _0 GLUCOSE 125* 122* 147* 97  BUN 24* _1 CALCIUM 10.2 10.1 9.6 9.7  CREATININE 1.45* 1.28* 1.36* 1.63*  GFRNONAA 52* 60 56* 45*  GFRAA 60* 69 64 52*    LIVER FUNCTION TESTS: Recent Labs    05/09/19 0901 02/19/20 1009 03/03/20 0922 03/05/20 1457  BILITOT 0.3 0.7 0.3 0.6  AST  _0 ALT _1 ALKPHOS  --  78 125* 114  PROT 7.3 7.8 7.4 7.7  ALBUMIN  --  3.9 4.1 3.1*    Assessment and Plan:  History of acute cholecystitis s/p percutaneous cholecystostomy tube placement in IR 03/06/2020. Cholecystostomy tube stable with approximately 75 cc blood-tinged bile with debris in gravity bag (additional 150 cc output from drain in past 24 hours per chart). Continue current management- continue with Qshift flushes/monitor of output. Further plans per IMTS/CCS- appreciate and agree with management. IR to  follow.   Electronically Signed: Earley Abide, PA-C 03/07/2020, 8:32 AM   I spent a total of 25 Minutes at the the patient's bedside AND on the patient's hospital floor or unit, greater than 50% of which was counseling/coordinating care for acute cholecystitis s/p cholecystostomy tube placement.

## 2020-03-08 LAB — CBC
HCT: 36.5 % — ABNORMAL LOW (ref 39.0–52.0)
Hemoglobin: 12.3 g/dL — ABNORMAL LOW (ref 13.0–17.0)
MCH: 31.6 pg (ref 26.0–34.0)
MCHC: 33.7 g/dL (ref 30.0–36.0)
MCV: 93.8 fL (ref 80.0–100.0)
Platelets: 480 10*3/uL — ABNORMAL HIGH (ref 150–400)
RBC: 3.89 MIL/uL — ABNORMAL LOW (ref 4.22–5.81)
RDW: 11.9 % (ref 11.5–15.5)
WBC: 12.6 10*3/uL — ABNORMAL HIGH (ref 4.0–10.5)
nRBC: 0 % (ref 0.0–0.2)

## 2020-03-08 LAB — GLUCOSE, CAPILLARY
Glucose-Capillary: 116 mg/dL — ABNORMAL HIGH (ref 70–99)
Glucose-Capillary: 143 mg/dL — ABNORMAL HIGH (ref 70–99)
Glucose-Capillary: 110 mg/dL — ABNORMAL HIGH (ref 70–99)
Glucose-Capillary: 191 mg/dL — ABNORMAL HIGH (ref 70–99)
Glucose-Capillary: 129 mg/dL — ABNORMAL HIGH (ref 70–99)

## 2020-03-08 LAB — BASIC METABOLIC PANEL
Anion gap: 11 (ref 5–15)
BUN: 11 mg/dL (ref 8–23)
CO2: 25 mmol/L (ref 22–32)
Calcium: 9.4 mg/dL (ref 8.9–10.3)
Chloride: 95 mmol/L — ABNORMAL LOW (ref 98–111)
Creatinine, Ser: 1.36 mg/dL — ABNORMAL HIGH (ref 0.61–1.24)
GFR calc Af Amer: >60 mL/min (ref >60)
GFR calc non Af Amer: 56 mL/min — ABNORMAL LOW (ref >60)
Glucose, Bld: 119 mg/dL — ABNORMAL HIGH (ref 70–99)
Potassium: 4.5 mmol/L (ref 3.5–5.1)
Sodium: 131 mmol/L — ABNORMAL LOW (ref 135–145)

## 2020-03-08 MED ORDER — SODIUM CHLORIDE 0.9 % IV SOLN
3.0000 g | Freq: Four times a day (QID) | INTRAVENOUS | Status: DC
  Administered 2020-03-08 – 2020-03-09 (×4): 3 g via INTRAVENOUS
  Filled 2020-03-08: qty 3
  Filled 2020-03-08 (×3): qty 8
  Filled 2020-03-08 (×2): qty 3
  Filled 2020-03-08: qty 8

## 2020-03-08 MED ORDER — LOSARTAN POTASSIUM 50 MG PO TABS
50.0000 mg | ORAL_TABLET | Freq: Every day | ORAL | Status: DC
  Administered 2020-03-08 – 2020-03-09 (×2): 50 mg via ORAL
  Filled 2020-03-08 (×2): qty 1

## 2020-03-08 MED ORDER — POLYETHYLENE GLYCOL 3350 17 G PO PACK
17.0000 g | PACK | Freq: Two times a day (BID) | ORAL | Status: DC
  Administered 2020-03-08: 17 g via ORAL
  Filled 2020-03-08 (×2): qty 1

## 2020-03-08 MED ORDER — SODIUM CHLORIDE 0.9 % IJ SOLN: INTRAMUSCULAR | 1 refills | Status: AC

## 2020-03-08 MED ORDER — SENNOSIDES-DOCUSATE SODIUM 8.6-50 MG PO TABS
1.0000 | ORAL_TABLET | Freq: Two times a day (BID) | ORAL | Status: DC
  Administered 2020-03-08 – 2020-03-09 (×2): 1 via ORAL
  Filled 2020-03-08 (×2): qty 1

## 2020-03-08 MED ORDER — HYDROMORPHONE HCL 2 MG PO TABS: 1.0000 mg | ORAL_TABLET | ORAL | Status: DC | PRN

## 2020-03-08 NOTE — Plan of Care (Signed)
  Problem: Coping: Goal: Level of anxiety will decrease Outcome: Completed/Met   Problem: Elimination: Goal: Will not experience complications related to urinary retention Outcome: Completed/Met   

## 2020-03-08 NOTE — Progress Notes (Signed)
Subjective/Chief Complaint: No complaints Eating breakfast and tolerating it well Still with mild RUQ pjain   Objective: Vital signs in last 24 hours: Temp:  [98.1 F (36.7 C)-100.6 F (38.1 C)] 99.9 F (37.7 C) (09/20 0426) Pulse Rate:  [78-96] 80 (09/20 0426) Resp:  [17-18] 17 (09/20 0426) BP: (131-151)/(75-81) 136/80 (09/20 0426) SpO2:  [96 %-100 %] 96 % (09/20 0426) Weight:  [78 kg] 78 kg (09/20 0424) Last BM Date: 03/02/20 (pt just started eating soft foods )  Intake/Output from previous day: 09/19 0701 - 09/20 0700 In: 530 [P.O.:480; IV Piggyback:50] Out: 1950 [Urine:1650; Drains:300] Intake/Output this shift: No intake/output data recorded.  Exam: Awake and alert Appears comfortable Abdomen soft, drain working well in the RUQ, mild tenderness  Lab Results:  Recent Labs    03/07/20 1222 03/08/20 0417  WBC 12.7* 12.6*  HGB 11.9* 12.3*  HCT 35.8* 36.5*  PLT 483* 480*   BMET Recent Labs    03/07/20 1222 03/08/20 0417  NA 132* 131*  K 4.8 4.5  CL 98 95*  CO2 22 25  GLUCOSE 113* 119*  BUN 14 11  CREATININE 1.38* 1.36*  CALCIUM 9.2 9.4   PT/INR Recent Labs    03/06/20 1307 03/07/20 1222  LABPROT 13.2 14.7  INR 1.0 1.2   ABG No results for input(s): PHART, HCO3 in the last 72 hours.  Invalid input(s): PCO2, PO2  Studies/Results: IR Perc Cholecystostomy  Result Date: 03/06/2020 INDICATION: Acute cholecystitis. Poor operative candidate due to currently being on Plavix. Please perform image guided cholecystostomy tube placement for infection source control purposes. EXAM: ULTRASOUND AND FLUOROSCOPIC-GUIDED CHOLECYSTOSTOMY TUBE PLACEMENT COMPARISON:  None. MEDICATIONS: The patient is currently admitted to the hospital and on intravenous antibiotics. Antibiotics were administered within an appropriate time frame prior to skin puncture. ANESTHESIA/SEDATION: Moderate (conscious) sedation was employed during this procedure. A total of Versed 2 mg  and Fentanyl 150 mcg was administered intravenously. Moderate Sedation Time: 16 minutes. The patient's level of consciousness and vital signs were monitored continuously by radiology nursing throughout the procedure under my direct supervision. CONTRAST:  31mL OMNIPAQUE IOHEXOL 300 MG/ML SOLN - administered into the gallbladder fossa. FLUOROSCOPY TIME:  48 seconds (18 mGy) COMPLICATIONS: None immediate. PROCEDURE: Informed written consent was obtained from the patient after a discussion of the risks, benefits and alternatives to treatment. Questions regarding the procedure were encouraged and answered. A timeout was performed prior to the initiation of the procedure. The right upper abdominal quadrant was prepped and draped in the usual sterile fashion, and a sterile drape was applied covering the operative field. Maximum barrier sterile technique with sterile gowns and gloves were used for the procedure. A timeout was performed prior to the initiation of the procedure. Local anesthesia was provided with 1% lidocaine with epinephrine. Ultrasound scanning of the right upper quadrant demonstrates a markedly dilated gallbladder. Of note, the patient reported pain with ultrasound imaging over the gallbladder. Utilizing a transhepatic approach, a 22 gauge needle was advanced into the gallbladder under direct ultrasound guidance. An ultrasound image was saved for documentation purposes. Appropriate intraluminal puncture was confirmed with the efflux of bile and advancement of an 0.018 wire into the gallbladder lumen. The needle was exchanged for an [place] set. A small amount of contrast was injected to confirm appropriate intraluminal positioning. Over a Benson wire, a 68.2-French Cook cholecystomy tube was advanced into the gallbladder fossa, coiled and locked. Small amount of purulent appearing bile was aspirated, capped and sent to the laboratory  for analysis. A small amount of contrast was injected as several post  procedural spot radiographic images were obtained in various obliquities. The catheter was secured to the skin with suture, connected to a drainage bag and a dressing was placed. The patient tolerated the procedure well without immediate post procedural complication. IMPRESSION: Successful ultrasound and fluoroscopic guided placement of a 10.2 French cholecystostomy tube. Small amount of aspirated bile was capped and sent to the laboratory for analysis. Electronically Signed   By: Sandi Mariscal M.D.   On: 03/06/2020 12:34    Anti-infectives: Anti-infectives (From admission, onward)   Start     Dose/Rate Route Frequency Ordered Stop   03/07/20 1400  piperacillin-tazobactam (ZOSYN) IVPB 3.375 g        3.375 g 12.5 mL/hr over 240 Minutes Intravenous Every 8 hours 03/07/20 0927 03/13/20 1359   03/06/20 1200  piperacillin-tazobactam (ZOSYN) IVPB 3.375 g  Status:  Discontinued        3.375 g 100 mL/hr over 30 Minutes Intravenous Every 6 hours 03/06/20 1100 03/07/20 0927   03/06/20 1200  bictegravir-emtricitabine-tenofovir AF (BIKTARVY) 50-200-25 MG per tablet 1 tablet        1 tablet Oral Daily 03/06/20 1128     03/06/20 0515  piperacillin-tazobactam (ZOSYN) IVPB 3.375 g        3.375 g 100 mL/hr over 30 Minutes Intravenous  Once 03/06/20 0501 03/06/20 0552      Assessment/Plan: Cholecystitis s/p IR placed cholecystostomy tube on 9/18  From a surgical standpoint, he is stable. Will need to go home with the perc chole tube in place Dr. Redmond Pulling will need to see him at [facility] as an outpt in about 3 weeks to plan eventual cholecystectomy. Will sign off from a surgical standpoint and see PRN while in the hospital.   LOS: 2 days    Coralie Keens MD 03/08/2020

## 2020-03-08 NOTE — Progress Notes (Signed)
Subjective:   He had some of his pancakes for breakfast, feels ready to continue trying a solid diet. No BM yet but is passing gas. He notes that his umbilicus is still protruding but denies other abdominal distention. He denies nausea, vomiting; however, he states he has not had a bowel movement in 6-7 days. He states he usually has a bowel movement a couple of times per day. Denies CP, SOB, feeling feverish, or chills.   Objective:  Vital signs in last 24 hours: Vitals:   03/07/20 2125 03/08/20 0213 03/08/20 0424 03/08/20 0426  BP:    136/80  Pulse:    80  Resp:    17  Temp: (!) 100.6 F (38.1 C) 99.9 F (37.7 C)  99.9 F (37.7 C)  TempSrc: Oral Oral  Oral  SpO2:    96%  Weight:   78 kg     Physical Exam Constitutional: no acute distress Head: atraumatic ENT: external ears normal Eyes: EOMI Cardiovascular: regular rate and rhythm, normal heart sounds Pulmonary: effort normal, lungs clear to ascultation bilaterally Abdominal: flat, mildly tender in RUQ, moderately tender and protruding umbilicus, not clearly reducible due to pain, remainder of abdomen is nontender, gallbladder drain with minimal sanguinous fluid Skin: warm and dry Neurological: alert, no focal deficit Psychiatric: normal mood and affect  Assessment/Plan: John Dickerson is a 59 y.o. male with  hx of type II DM, HTN, HIV, HLD, hep C, CAD s/p stents presenting with acute acalculous cholecystitis, s/p percutaneous drainage by IR 9/18. Tolerating diet well, no BM yet, pain controlled. To follow up with IR and surgery outpatient to determine whether cholecystectomy ultimately needed.  Principal Problem:   Acute cholecystitis Active Problems:   Human immunodeficiency virus (HIV) disease ([place])   Essential hypertension   Type 2 diabetes mellitus with circulatory disorder causing erectile dysfunction (HCC)   Coronary artery disease involving native coronary artery of native heart  Acute cholecystitis s/p  percutaneous drainage on 9/18 Protuberant umbilicus, possible hernia POD 2. CT consistent with acute cholecystitis, no definite stones seen. Hemodynamically stable. Abdomen much less tender and distended. Umbilicus is protuberant and less tender, reports this has only been since his Sx started.  -surgery signed off, patient okay to dc from their perspective -IR left catheter care instructions in AVS, will schedule f/u in their drain clinic -advance to heart healthy diet -continue Zosyn, day 3 -start senna and miralax   AKI on CKD - resolved Creatinine 1.36 this morning. Presented with creatinine of 1.63, baseline 1.2-1.3. Likely pre-renal  -good PO intake -daily BMP   CAD s/p DES stents in 2017 Reports angina in 2017, had a cath with severe LAD disease, DES stents to mid LAD and distal LAD. On ASA and Plavix at home. Last cardiology visit September 2020. No echo on file.  -no need for Plavix this far out from his stents, will discontinue -continue home ASA, lipitor 40mg , and metoprolol succinate 100mg  daily   HIV Stable on Biktarvy daily. Last testing in November 2020 with quant RNA <20, CD4 count 634. -continue Biktarvy   Essential HTN On amlodipine 10mg , losartan 100mg , Toprol 100mg , spironolactone 25mg  at home. BP 130-150/70s this admission. -continue Toprol  -restart losartan at 50mg  -hold rest given normotensive   DM Last A1c of 6.4 on 03/03/20. Glucose 102 this morning. Well controlled on metformin 1000mg  BID at home.  -SSI  Diet: heart healthy IVF: n/a VTE: SCDs Prior to Admission Living Arrangement: home Anticipated Discharge Location: home Barriers  to Discharge: bowel recovery, PO intake Dispo: Anticipated discharge in approximately 0-1 day(s).   Andrew Au, MD 03/08/2020, 6:27 AM Pager: [phone] After 5pm on weekdays and 1pm on weekends: On Call pager [phone]

## 2020-03-08 NOTE — Progress Notes (Signed)
Referring Physician(s): Greer Pickerel  Supervising Physician: Aletta Edouard  Patient Status:  [facility] - In-pt  Chief Complaint: Follow up percutaneous cholecystostomy placed 03/06/20 in IR  Subjective:  Sitting up in bed watching TV, denies complaints, wondering when he can go home.  Allergies: Patient has no known allergies.  Medications: Prior to Admission medications   Medication Sig Start Date End Date Taking? Authorizing Provider  acetaminophen (TYLENOL) 500 MG tablet Take 2 tablets (1,000 mg total) by mouth every 6 (six) hours as needed for moderate pain. 08/29/19  Yes Darr, Marguerita Beards, PA-C  allopurinol (ZYLOPRIM) 100 MG tablet TAKE 1 AND 1/2 TABLETS(150 MG) BY MOUTH DAILY Patient taking differently: Take 150 mg by mouth daily.  07/28/19  Yes Agyei, Caprice Kluver, MD  amLODipine (NORVASC) 10 MG tablet Take 1 tablet (10 mg total) by mouth daily. 11/03/19  Yes Axel Filler, MD  aspirin EC 81 MG tablet Take 1 tablet (81 mg total) by mouth daily. 11/17/15  Yes Bensimhon, Shaune Pascal, MD  atorvastatin (LIPITOR) 40 MG tablet Take 1 tablet (40 mg total) by mouth daily at 6 PM. 03/21/19  Yes Jettie Booze, MD  BIKTARVY 50-200-25 MG TABS tablet TAKE 1 TABLET BY MOUTH DAILY 12/01/19  Yes Campbell Riches, MD  clopidogrel (PLAVIX) 75 MG tablet Take 1 tablet (75 mg total) by mouth daily. 12/04/19  Yes Agyei, Caprice Kluver, MD  lidocaine (LIDODERM) 5 % Place 1 patch onto the skin daily as needed (flank pain). Remove & Discard patch within 12 hours or as directed by MD   Yes [provider]  losartan (COZAAR) 100 MG tablet TAKE 1 TABLET(100 MG) BY MOUTH DAILY 04/30/19  Yes Jettie Booze, MD  metFORMIN (GLUCOPHAGE) 1000 MG tablet TAKE 1 TABLET BY MOUTH TWICE DAILY WITH A MEAL 12/01/19  Yes Agyei, Caprice Kluver, MD  metoprolol succinate (TOPROL-XL) 100 MG 24 hr tablet TAKE 1 TABLET BY MOUTH DAILY WITH OR IMMEDAITELY FOLLOWING A MEAL 10/21/19  Yes Agyei, Caprice Kluver, MD  Multiple Vitamin (MULTIVITAMIN  WITH MINERALS) TABS tablet Take 1 tablet by mouth daily.   Yes [provider]  nitroGLYCERIN (NITROSTAT) 0.4 MG SL tablet Place 1 tablet (0.4 mg total) under the tongue every 5 (five) minutes as needed for chest pain. 11/17/15  Yes Bensimhon, Shaune Pascal, MD  spironolactone (ALDACTONE) 25 MG tablet Take 1 tablet (25 mg total) by mouth daily. Need to make appt with provider for further refills - 1st attempt 02/25/20  Yes Jettie Booze, MD  blood glucose meter kit and supplies KIT Dispense based on patient and insurance preference. Use up to four times daily as directed. (FOR ICD-9 250.00, 250.01). Patient not taking: Reported on 07/11/2019 08/03/15   Bethena Roys, MD  Blood Glucose Monitoring Suppl ([place]) w/Device KIT 1 each by Does not apply route daily. Patient not taking: Reported on 07/11/2019 12/22/15   Collier Salina, MD  Continuous Blood Gluc Receiver (FREESTYLE LIBRE 2 READER SYSTM) DEVI 1 Device by Does not apply route 3 (three) times daily. Patient not taking: Reported on 07/11/2019 01/08/19   Jean Rosenthal, MD  Continuous Blood Gluc Sensor (FREESTYLE LIBRE 2 SENSOR SYSTM) MISC 1 Device by Does not apply route 3 (three) times daily. Patient not taking: Reported on 07/11/2019 01/08/19   Jean Rosenthal, MD  glucose blood (ONETOUCH VERIO) test strip Check blood sugar up to 1 time a day Patient not taking: Reported on 07/11/2019 12/22/15  Rice, Resa Miner, MD  [facility] DELICA LANCETS FINE MISC Check blood sugar up to 1 time a day Patient not taking: Reported on 07/11/2019 12/22/15   Collier Salina, MD     Vital Signs: BP 139/82 (BP Location: Left Arm)   Pulse 80   Temp 99.4 F (37.4 C) (Oral)   Resp 17   Wt 171 lb 15.3 oz (78 kg)   SpO2 98%   BMI 30.46 kg/m   Physical Exam Vitals reviewed.  Constitutional:      General: He is not in acute distress. HENT:     Head: Normocephalic.  Cardiovascular:     Rate and Rhythm: Normal rate.    Pulmonary:     Effort: Pulmonary effort is normal.  Abdominal:     Palpations: Abdomen is soft.     Comments: (+) RUQ drain to gravity with ~15 cc serosanguineous output. Flushes easily. Insertion site unremarkable.  Skin:    General: Skin is warm and dry.  Neurological:     Mental Status: He is alert. Mental status is at baseline.     Imaging: CT Abdomen Pelvis Wo Contrast  Result Date: 03/05/2020 CLINICAL DATA:  RIGHT-sided abdominal pain for 2 weeks. EXAM: CT ABDOMEN AND PELVIS WITHOUT CONTRAST TECHNIQUE: Multidetector CT imaging of the abdomen and pelvis was performed following the standard protocol without IV contrast. COMPARISON:  March 03, 2020 FINDINGS: Lower chest: Coronary artery atherosclerotic calcifications. Scattered bibasilar atelectasis. Gynecomastia. Hepatobiliary: The gallbladder is distended with a thickened wall and adjacent fat stranding. No definitive intrahepatic biliary ductal dilation on this noncontrast examination. No definitive choledocholithiasis visualized on this noncontrast examination. Focal fatty deposition adjacent to the falciform ligament. Pancreas: Unremarkable noncontrast appearance of the pancreas. Spleen: Normal in size without focal abnormality. Adrenals/Urinary Tract: Bilateral adrenal glands are unremarkable. No nephrolithiasis. No hydronephrosis. Bladder is unremarkable. Stomach/Bowel: Stomach is within normal limits. Appendix appears normal. No evidence of bowel wall thickening, distention, or inflammatory changes. Vascular/Lymphatic: Moderate to severe aortic atherosclerotic calcifications. No suspicious lymphadenopathy. Reproductive: Prostate is unremarkable. Other: Fat containing umbilical hernia. Musculoskeletal: Degenerative changes of the lumbar spine. IMPRESSION: 1. Constellation of findings are consistent with acute cholecystitis. No definitive radiopaque choledocholithiasis visualized. Aortic Atherosclerosis (ICD10-I70.0). These results will  be called to the ordering clinician or representative by the Radiologist Assistant, and communication documented in the PACS or [employer]. Electronically Signed   By: Valentino Saxon MD   On: 03/05/2020 13:00   IR Perc Cholecystostomy  Result Date: 03/06/2020 INDICATION: Acute cholecystitis. Poor operative candidate due to currently being on Plavix. Please perform image guided cholecystostomy tube placement for infection source control purposes. EXAM: ULTRASOUND AND FLUOROSCOPIC-GUIDED CHOLECYSTOSTOMY TUBE PLACEMENT COMPARISON:  None. MEDICATIONS: The patient is currently admitted to the hospital and on intravenous antibiotics. Antibiotics were administered within an appropriate time frame prior to skin puncture. ANESTHESIA/SEDATION: Moderate (conscious) sedation was employed during this procedure. A total of Versed 2 mg and Fentanyl 150 mcg was administered intravenously. Moderate Sedation Time: 16 minutes. The patient's level of consciousness and vital signs were monitored continuously by radiology nursing throughout the procedure under my direct supervision. CONTRAST:  69m OMNIPAQUE IOHEXOL 300 MG/ML SOLN - administered into the gallbladder fossa. FLUOROSCOPY TIME:  48 seconds (18 mGy) COMPLICATIONS: None immediate. PROCEDURE: Informed written consent was obtained from the patient after a discussion of the risks, benefits and alternatives to treatment. Questions regarding the procedure were encouraged and answered. A timeout was performed prior to the initiation of the procedure. The right  upper abdominal quadrant was prepped and draped in the usual sterile fashion, and a sterile drape was applied covering the operative field. Maximum barrier sterile technique with sterile gowns and gloves were used for the procedure. A timeout was performed prior to the initiation of the procedure. Local anesthesia was provided with 1% lidocaine with epinephrine. Ultrasound scanning of the right upper quadrant  demonstrates a markedly dilated gallbladder. Of note, the patient reported pain with ultrasound imaging over the gallbladder. Utilizing a transhepatic approach, a 22 gauge needle was advanced into the gallbladder under direct ultrasound guidance. An ultrasound image was saved for documentation purposes. Appropriate intraluminal puncture was confirmed with the efflux of bile and advancement of an 0.018 wire into the gallbladder lumen. The needle was exchanged for an [place] set. A small amount of contrast was injected to confirm appropriate intraluminal positioning. Over a Benson wire, a 23.2-French Cook cholecystomy tube was advanced into the gallbladder fossa, coiled and locked. Small amount of purulent appearing bile was aspirated, capped and sent to the laboratory for analysis. A small amount of contrast was injected as several post procedural spot radiographic images were obtained in various obliquities. The catheter was secured to the skin with suture, connected to a drainage bag and a dressing was placed. The patient tolerated the procedure well without immediate post procedural complication. IMPRESSION: Successful ultrasound and fluoroscopic guided placement of a 10.2 French cholecystostomy tube. Small amount of aspirated bile was capped and sent to the laboratory for analysis. Electronically Signed   By: Sandi Mariscal M.D.   On: 03/06/2020 12:34    Labs:  CBC: Recent Labs    03/04/20 0941 03/05/20 1457 03/07/20 1222 03/08/20 0417  WBC 14.1* 12.7* 12.7* 12.6*  HGB 12.1* 11.9* 11.9* 12.3*  HCT 35.6* 36.4* 35.8* 36.5*  PLT 545* 486* 483* 480*    COAGS: Recent Labs    03/06/20 1307 03/07/20 1222  INR 1.0 1.2    BMP: Recent Labs    03/04/20 0941 03/05/20 1457 03/07/20 1222 03/08/20 0417  NA 132* 136 132* 131*  K 4.7 5.0 4.8 4.5  CL 95* 100 98 95*  CO2 '21 25 22 25  ' GLUCOSE 147* 97 113* 119*  BUN '18 20 14 11  ' CALCIUM 9.6 9.7 9.2 9.4  CREATININE 1.36* 1.63* 1.38* 1.36*    GFRNONAA 56* 45* 55* 56*  GFRAA 64 52* >60 >60    LIVER FUNCTION TESTS: Recent Labs    02/19/20 1009 03/03/20 0922 03/05/20 1457 03/07/20 1222  BILITOT 0.7 0.3 0.6 1.3*  AST '19 17 24 ' 100*  ALT '20 17 24 ' 109*  ALKPHOS 78 125* 114 435*  PROT 7.8 7.4 7.7 7.1  ALBUMIN 3.9 4.1 3.1* 2.5*    Assessment and Plan:  59 y/o M s/p percutaneous cholecystostomy placement due to acute acalculous cholecystitis 03/06/20 in IR. Per I/O 300 cc output last 24H. On exam ~15 cc serosanguineous output, no issues with flushing.   Per [facility] note from today ok for d/c and follow up with their office in 3-4 weeks to discuss cholecystectomy.  Upon discharge patient will need to flush catheter QD with 5 cc NS (flushes eRx to pharmacy today) and record output at least once daily, this was demonstrated to patient by myself today at bedside.. Do not submerge drain, may shower if drain is covered with water tight dressing, dressing changes every 3 days or sooner if soiled/wet. He will be scheduled for a drain injection in IR clinic in about 4 weeks unless  this is no longer needed after he is seen by [facility]. Contact information and written drain care instructions have been placed in his AVS today.  Please call with questions or concerns.  Electronically Signed: Joaquim Nam, PA-C 03/08/2020, 10:20 AM   I spent a total of 15 Minutes at the the patient's bedside AND on the patient's hospital floor or unit, greater than 50% of which was counseling/coordinating care for percutaneous cholecystostomy follow up.

## 2020-03-09 LAB — GLUCOSE, CAPILLARY
Glucose-Capillary: 114 mg/dL — ABNORMAL HIGH (ref 70–99)
Glucose-Capillary: 121 mg/dL — ABNORMAL HIGH (ref 70–99)
Glucose-Capillary: 123 mg/dL — ABNORMAL HIGH (ref 70–99)
Glucose-Capillary: 123 mg/dL — ABNORMAL HIGH (ref 70–99)

## 2020-03-09 LAB — BASIC METABOLIC PANEL
Anion gap: 11 (ref 5–15)
BUN: 17 mg/dL (ref 8–23)
CO2: 25 mmol/L (ref 22–32)
Calcium: 9.4 mg/dL (ref 8.9–10.3)
Chloride: 98 mmol/L (ref 98–111)
Creatinine, Ser: 1.5 mg/dL — ABNORMAL HIGH (ref 0.61–1.24)
GFR calc Af Amer: 57 mL/min — ABNORMAL LOW (ref >60)
GFR calc non Af Amer: 50 mL/min — ABNORMAL LOW (ref >60)
Glucose, Bld: 120 mg/dL — ABNORMAL HIGH (ref 70–99)
Potassium: 4.2 mmol/L (ref 3.5–5.1)
Sodium: 134 mmol/L — ABNORMAL LOW (ref 135–145)

## 2020-03-09 LAB — CBC
HCT: 36.2 % — ABNORMAL LOW (ref 39.0–52.0)
Hemoglobin: 12.2 g/dL — ABNORMAL LOW (ref 13.0–17.0)
MCH: 31.7 pg (ref 26.0–34.0)
MCHC: 33.7 g/dL (ref 30.0–36.0)
MCV: 94 fL (ref 80.0–100.0)
Platelets: 525 10*3/uL — ABNORMAL HIGH (ref 150–400)
RBC: 3.85 MIL/uL — ABNORMAL LOW (ref 4.22–5.81)
RDW: 11.8 % (ref 11.5–15.5)
WBC: 10.5 10*3/uL (ref 4.0–10.5)
nRBC: 0 % (ref 0.0–0.2)

## 2020-03-09 LAB — HEPATIC FUNCTION PANEL
ALT: 73 U/L — ABNORMAL HIGH (ref 0–44)
AST: 47 U/L — ABNORMAL HIGH (ref 15–41)
Albumin: 2.5 g/dL — ABNORMAL LOW (ref 3.5–5.0)
Alkaline Phosphatase: 494 U/L — ABNORMAL HIGH (ref 38–126)
Bilirubin, Direct: 0.1 mg/dL (ref 0.0–0.2)
Indirect Bilirubin: 0.5 mg/dL (ref 0.3–0.9)
Total Bilirubin: 0.6 mg/dL (ref 0.3–1.2)
Total Protein: 7.9 g/dL (ref 6.5–8.1)

## 2020-03-09 MED ORDER — AMOXICILLIN-POT CLAVULANATE ER 1000-62.5 MG PO TB12: 1.0000 | ORAL_TABLET | Freq: Two times a day (BID) | ORAL | 0 refills | Status: AC

## 2020-03-09 NOTE — Progress Notes (Signed)
Subjective:   John Dickerson feels well this morning. Abdominal pain has improved, no opioids requested last night. Tolerating heart healthy diet without difficulty. Had a loose BM yesterday. Denies n/v, SOB, CP. He is comfortable with discharge today.  Objective:  Vital signs in last 24 hours: Vitals:   03/08/20 0750 03/08/20 1127 03/08/20 2024 03/09/20 0355  BP: 139/82 129/82 130/76 119/80  Pulse: 80 81 83 84  Resp: 17 16 19 15   Temp: 99.4 F (37.4 C) 99 F (37.2 C) 100 F (37.8 C) 99.4 F (37.4 C)  TempSrc: Oral Oral Oral Oral  SpO2: 98% 98% 99% 99%  Weight:    76.7 kg    Physical Exam Constitutional: no acute distress Head: atraumatic ENT: external ears normal Eyes: EOMI Cardiovascular: regular rate and rhythm, normal heart sounds Pulmonary: effort normal, lungs clear to ascultation bilaterally Abdominal: flat, mildly tender in RUQ, mildly tender and protruding umbilicus, not clearly reducible due to pain, remainder of abdomen is nontender, gallbladder drain with mostly serous fluid with small streaks of blood Skin: warm and dry Neurological: alert, no focal deficit Psychiatric: normal mood and affect  Assessment/Plan: John Dickerson is a 59 y.o. male with  hx of type II DM, HTN, HIV, HLD, hep C, CAD s/p stents presenting with acute acalculous cholecystitis, s/p percutaneous drainage by IR 9/18. Tolerating diet well, BM yesterday, pain controlled. To follow up with IR and surgery outpatient to determine whether cholecystectomy ultimately needed.  Principal Problem:   Acute cholecystitis Active Problems:   Human immunodeficiency virus (HIV) disease ([place])   Essential hypertension   Type 2 diabetes mellitus with circulatory disorder causing erectile dysfunction (HCC)   Coronary artery disease involving native coronary artery of native heart  Acute cholecystitis s/p percutaneous drainage on 9/18 Protuberant umbilicus, possible hernia POD 3. CT was consistent  with acute cholecystitis, no definite stones seen. Hemodynamically stable. Abdomen much less tender and distended. Umbilicus is protuberant and less tender, reports this has only been since his Sx started. Last fever was 9/19, WBC continues trending down. -surgery signed off, patient okay to dc from their perspective -IR left catheter care instructions in AVS, will schedule f/u in their drain clinic -advance to heart healthy diet -switched to Unasyn, day 4 of Abx - change to Augment on discharge -start senna and miralax   AKI on CKD  Creatinine bump 1.5 < 1.36 this morning. Presented with creatinine of 1.63, baseline 1.2-1.3. Likely pre-renal, somewhat low PO intake over past day -encouraged PO intake -daily BMP   CAD s/p DES stents in 2017 Reports angina in 2017, had a cath with severe LAD disease, DES stents to mid LAD and distal LAD. On ASA and Plavix at home. Last cardiology visit September 2020. No echo on file.  -no need for Plavix this far out from his stents, will discontinue -continue home ASA, lipitor 40mg , and metoprolol succinate 100mg  daily   HIV Stable on Biktarvy daily. Last testing in November 2020 with quant RNA <20, CD4 count 634. -continue Biktarvy   Essential HTN On amlodipine 10mg , losartan 100mg , Toprol 100mg , spironolactone 25mg  at home. BP 130-150/70s this admission. -continue Toprol  -continue losartan at 50mg  -hold rest given normotensive   DM Last A1c of 6.4 on 03/03/20. Glucose 102 this morning. Well controlled on metformin 1000mg  BID at home.  -SSI  Diet: heart healthy IVF: n/a VTE: SCDs Prior to Admission Living Arrangement: home Anticipated Discharge Location: home Barriers to Discharge: medically stable for discharge Dispo: Anticipated  discharge in approximately 0 day(s).   Andrew Au, MD 03/09/2020, 7:04 AM Pager: [phone] After 5pm on weekdays and 1pm on weekends: On Call pager [phone]

## 2020-03-09 NOTE — TOC Transition Note (Signed)
Transition of Care [facility]) - CM/SW Discharge Note   Patient Details  Name: John Dickerson MRN: [id number] Date of Birth: 1961-03-22  Transition of Care [facility]) CM/SW Contact:  Zenon Mayo, RN Phone Number: 03/09/2020, 3:37 PM   Clinical Narrative:    Patient is for dc today, NCM offered choice for [facility] for drain care.  He states Alvis Lemmings is ok with him, NCM made referral to [facility] with Alvis Lemmings ,he is able to take referral.     Final next level of care: Home w Home Health Services Barriers to Discharge: No Barriers Identified   Patient Goals and CMS Choice Patient states their goals for this hospitalization and ongoing recovery are:: get better CMS Medicare.gov Compare Post Acute Care list provided to:: Patient Choice offered to / list presented to : Patient  Discharge Placement                       Discharge Plan and Services                  DME Agency: NA       HH Arranged: RN [facility] Agency: [place] Date [place]: 03/09/20 Time [place]: [phone] Representative spoke with at [place]: [place] ([place]) Interventions     Readmission Risk Interventions No flowsheet data found.

## 2020-03-09 NOTE — Discharge Summary (Addendum)
Name: John Dickerson MRN: [id number] DOB: 1961/04/22 59 y.o. PCP: Jean Rosenthal, MD  Date of Admission: 03/05/2020  2:21 PM Date of Discharge: 03/09/2020 Attending Physician: Lenice Pressman, MD Discharge Diagnosis: 1. Acute cholecystitis 2. AKI on CKD  Discharge Medications: Allergies as of 03/09/2020   No Known Allergies      Medication List     STOP taking these medications    clopidogrel 75 MG tablet Commonly known as: PLAVIX       TAKE these medications    acetaminophen 500 MG tablet Commonly known as: TYLENOL Take 2 tablets (1,000 mg total) by mouth every 6 (six) hours as needed for moderate pain.   allopurinol 100 MG tablet Commonly known as: ZYLOPRIM TAKE 1 AND 1/2 TABLETS(150 MG) BY MOUTH DAILY What changed: See the new instructions.   amLODipine 10 MG tablet Commonly known as: NORVASC Take 1 tablet (10 mg total) by mouth daily.   amoxicillin-clavulanate 1000-62.5 MG 12 hr tablet Commonly known as: Augmentin XR Take 1 tablet by mouth 2 (two) times daily for 3 days.   aspirin EC 81 MG tablet Take 1 tablet (81 mg total) by mouth daily.   atorvastatin 40 MG tablet Commonly known as: LIPITOR Take 1 tablet (40 mg total) by mouth daily at 6 PM.   Biktarvy 50-200-25 MG Tabs tablet Generic drug: bictegravir-emtricitabine-tenofovir AF TAKE 1 TABLET BY MOUTH DAILY   blood glucose meter kit and supplies Kit Dispense based on patient and insurance preference. Use up to four times daily as directed. (FOR ICD-9 250.00, 250.01).   FreeStyle [place] 2 Reader Systm Devi 1 Device by Does not apply route 3 (three) times daily.   FreeStyle Libre 2 Sensor Systm Misc 1 Device by Does not apply route 3 (three) times daily.   glucose blood test strip Commonly known as: OneTouch Verio Check blood sugar up to 1 time a day   lidocaine 5 % Commonly known as: LIDODERM Place 1 patch onto the skin daily as needed (flank pain). Remove & Discard patch within 12  hours or as directed by MD   losartan 100 MG tablet Commonly known as: COZAAR TAKE 1 TABLET(100 MG) BY MOUTH DAILY   metFORMIN 1000 MG tablet Commonly known as: GLUCOPHAGE TAKE 1 TABLET BY MOUTH TWICE DAILY WITH A MEAL   metoprolol succinate 100 MG 24 hr tablet Commonly known as: TOPROL-XL TAKE 1 TABLET BY MOUTH DAILY WITH OR IMMEDAITELY FOLLOWING A MEAL   multivitamin with minerals Tabs tablet Take 1 tablet by mouth daily.   nitroGLYCERIN 0.4 MG SL tablet Commonly known as: NITROSTAT Place 1 tablet (0.4 mg total) under the tongue every 5 (five) minutes as needed for chest pain.   OneTouch Delica Lancets Fine Misc Check blood sugar up to 1 time a day   OneTouch Verio Flex System w/Device Kit 1 each by Does not apply route daily.   sodium chloride 0.9 % injection Instill 5 mL once daily into drain.   spironolactone 25 MG tablet Commonly known as: ALDACTONE Take 1 tablet (25 mg total) by mouth daily. Need to make appt with provider for further refills - 1st attempt               Discharge Care Instructions  (From admission, onward)           Start     Ordered   03/09/20 0000  Discharge wound care:       Comments: As instructed by interventional radiology  03/09/20 1247            Disposition and follow-up:   John Dickerson was discharged from [facility] in Good condition.  At the hospital follow up visit please address:  1.  Follow up      A. Acute cholecystitis s/p percutaneous cholecystotomy - f/u with surgery to consider eventual cholecystectomy, f/u with IR drain clinic      B. AKI on CKD 3a - pre-renal, resolved but then had mild bump on day of discharge, encouraged PO intake, f/u creatinine      C. Umbilical hernia - started with cholecystitis Sx, examine for reducibility  2.  Labs / imaging needed at time of follow-up: BMP, CBC  3.  Pending labs/ test needing follow-up: none  Follow-up Appointments:  Follow-up  Information     Greer Pickerel, MD. Schedule an appointment as soon as possible for a visit in 3 week(s).   Specialty: General Surgery Why: call office to make and appoint after discharge in 3 to 4 weeks Contact information: [address] [place] [address] [phone]         Sandi Mariscal, MD Follow up in 4 week(s).   Specialties: Interventional Radiology, Radiology Why: IR scheduler will call you with appointment date/time. Please call with any questions or concerns prior to your appointment. Contact information: [address] [place] [address] [phone]         Jean Rosenthal, MD.   Specialty: Internal Medicine Why: Please follow up in a week Contact information: [address] [address] [phone]         Care, [facility] Follow up.   Specialty: Home Health Services Why: [place] information: [address]  [place] [address] [phone]                 Hospital Course by problem list: Acute cholecystitis s/p percutaneous drainage on 9/18 Patient presented with 3 weeks of RUQ pain worse after eating. Outpatient RUQ [place] with biliary sludge and gallbaldder enlargement, CT confirmed acute cholecystitis though no definite stones seen. Started on Zosyn. Surgery evaluated and with his lack of stones and hx of RUQ surgery as a child (likely for pyloric stenosis), decided IR procedure would be advantageous. IR performed percutaneous cholecystostomy on 9/18 with drainage of bile. Fever on 9/19 but improving abdominal exam. Changed Zosyn to Unasyn. Patient recovering well, pain controlled without any pain medications on day of discharge and tolerating solid foods. BM on 9/20 which was loose. Discharged with Augmentin to complete course.  Protuberant umbilicus, possibly hernia  Patient reports this is a new problem since the time his RUQ pain started. On exam it is soft, tender, not  dusky, not easily reproducible due to pain. CT scan without concern. F/u outpatient and ensure it is reducible.  AKI on CKD  Baseline creatinine around 1.2-1.3, presented with creatinine 1.63. Resolved with IV fluids, likely pre-renal, then had bump to 1.5 on day of discharge, likely due to poor PO intake. Ensure resolution on outpatient f/u.  Discharge Vitals:   BP (!) 146/84 (BP Location: Left Arm)   Pulse 90   Temp 99 F (37.2 C) (Oral)   Resp 18   Wt 76.7 kg   SpO2 98%   BMI 29.97 kg/m   Pertinent Labs, Studies, and Procedures:   CT Abdomen Pelvis Wo Contrast  Result Date: 03/05/2020 CLINICAL DATA:  RIGHT-sided  abdominal pain for 2 weeks. EXAM: CT ABDOMEN AND PELVIS WITHOUT CONTRAST TECHNIQUE: Multidetector CT imaging of the abdomen and pelvis was performed following the standard protocol without IV contrast. COMPARISON:  March 03, 2020 FINDINGS: Lower chest: Coronary artery atherosclerotic calcifications. Scattered bibasilar atelectasis. Gynecomastia. Hepatobiliary: The gallbladder is distended with a thickened wall and adjacent fat stranding. No definitive intrahepatic biliary ductal dilation on this noncontrast examination. No definitive choledocholithiasis visualized on this noncontrast examination. Focal fatty deposition adjacent to the falciform ligament. Pancreas: Unremarkable noncontrast appearance of the pancreas. Spleen: Normal in size without focal abnormality. Adrenals/Urinary Tract: Bilateral adrenal glands are unremarkable. No nephrolithiasis. No hydronephrosis. Bladder is unremarkable. Stomach/Bowel: Stomach is within normal limits. Appendix appears normal. No evidence of bowel wall thickening, distention, or inflammatory changes. Vascular/Lymphatic: Moderate to severe aortic atherosclerotic calcifications. No suspicious lymphadenopathy. Reproductive: Prostate is unremarkable. Other: Fat containing umbilical hernia. Musculoskeletal: Degenerative changes of the lumbar spine.  IMPRESSION: 1. Constellation of findings are consistent with acute cholecystitis. No definitive radiopaque choledocholithiasis visualized. Aortic Atherosclerosis (ICD10-I70.0). These results will be called to the ordering clinician or representative by the Radiologist Assistant, and communication documented in the PACS or [employer]. Electronically Signed   By: Valentino Saxon MD   On: 03/05/2020 13:00   IR Perc Cholecystostomy  Result Date: 03/06/2020 INDICATION: Acute cholecystitis. Poor operative candidate due to currently being on Plavix. Please perform image guided cholecystostomy tube placement for infection source control purposes. EXAM: ULTRASOUND AND FLUOROSCOPIC-GUIDED CHOLECYSTOSTOMY TUBE PLACEMENT COMPARISON:  None. MEDICATIONS: The patient is currently admitted to the hospital and on intravenous antibiotics. Antibiotics were administered within an appropriate time frame prior to skin puncture. ANESTHESIA/SEDATION: Moderate (conscious) sedation was employed during this procedure. A total of Versed 2 mg and Fentanyl 150 mcg was administered intravenously. Moderate Sedation Time: 16 minutes. The patient's level of consciousness and vital signs were monitored continuously by radiology nursing throughout the procedure under my direct supervision. CONTRAST:  2m OMNIPAQUE IOHEXOL 300 MG/ML SOLN - administered into the gallbladder fossa. FLUOROSCOPY TIME:  48 seconds (18 mGy) COMPLICATIONS: None immediate. PROCEDURE: Informed written consent was obtained from the patient after a discussion of the risks, benefits and alternatives to treatment. Questions regarding the procedure were encouraged and answered. A timeout was performed prior to the initiation of the procedure. The right upper abdominal quadrant was prepped and draped in the usual sterile fashion, and a sterile drape was applied covering the operative field. Maximum barrier sterile technique with sterile gowns and gloves were used for  the procedure. A timeout was performed prior to the initiation of the procedure. Local anesthesia was provided with 1% lidocaine with epinephrine. Ultrasound scanning of the right upper quadrant demonstrates a markedly dilated gallbladder. Of note, the patient reported pain with ultrasound imaging over the gallbladder. Utilizing a transhepatic approach, a 22 gauge needle was advanced into the gallbladder under direct ultrasound guidance. An ultrasound image was saved for documentation purposes. Appropriate intraluminal puncture was confirmed with the efflux of bile and advancement of an 0.018 wire into the gallbladder lumen. The needle was exchanged for an ANiagara Fallsset. A small amount of contrast was injected to confirm appropriate intraluminal positioning. Over a Benson wire, a 1752-French Cook cholecystomy tube was advanced into the gallbladder fossa, coiled and locked. Small amount of purulent appearing bile was aspirated, capped and sent to the laboratory for analysis. A small amount of contrast was injected as several post procedural spot radiographic images were obtained in various obliquities. The catheter was secured to  the skin with suture, connected to a drainage bag and a dressing was placed. The patient tolerated the procedure well without immediate post procedural complication. IMPRESSION: Successful ultrasound and fluoroscopic guided placement of a 10.2 French cholecystostomy tube. Small amount of aspirated bile was capped and sent to the laboratory for analysis. Electronically Signed   By: Sandi Mariscal M.D.   On: 03/06/2020 12:34    Discharge Instructions: Discharge Instructions     Call MD for:  extreme fatigue   Complete by: As directed    Call MD for:  persistant dizziness or light-headedness   Complete by: As directed    Diet Carb Modified   Complete by: As directed    Discharge instructions   Complete by: As directed    Mr. Reinig, it has been a pleasure taking care of you. We  treated you for acute cholecystitis by draining your gallbaldder. Please follow up with surgery and interventional radiology.  Here are your discharge instructions.  1) START Augmentin, 1 tablet twice a day, for 3 days 2) STOP Plavix. Continue aspirin. This is for your heart stent.  3) follow up with your PCP, you will be receiving a phone call 4) Follow up with surgery 5) Follow up with interventional radiology  Flush catheter daily with 5 mL normal saline and record output at least once daily. Do not submerge drain, may shower if drain is covered with water tight dressing, dressing changes every 3 days or sooner if soiled/wet. You will be scheduled for a drain injection in IR clinic in about 4 weeks unless this is no longer needed after he is seen by surgery.   Discharge wound care:   Complete by: As directed    As instructed by interventional radiology   Face-to-face encounter (required for Medicare/Medicaid patients)   Complete by: As directed    [facility] certify that this patient is under my care and that I, or a nurse practitioner or physician's assistant working with me, had a face-to-face encounter that meets the physician face-to-face encounter requirements with this patient on 03/09/2020. The encounter with the patient was in whole, or in part for the following medical condition(s) which is the primary reason for home health care (List medical condition): acute cholecystitis, drained with percutaneous cholecystotomy  Requires home health for drain care and wound care.   The encounter with the patient was in whole, or in part, for the following medical condition, which is the primary reason for home health care: acute cholecystitis   I certify that, based on my findings, the following services are medically necessary home health services: Nursing   Reason for Medically Necessary Home Health Services: Skilled Nursing- Complex Wound Care   My clinical findings support the need for  the above services: OTHER SEE COMMENTS   Further, I certify that my clinical findings support that this patient is homebound due to: Unable to leave home safely without assistance   Home Health   Complete by: As directed    Requires home health RN for drain care and wound care   To provide the following care/treatments: RN   Increase activity slowly   Complete by: As directed        Signed: Andrew Au, MD 03/09/2020, 4:48 PM   Pager: [phone]

## 2020-03-09 NOTE — Progress Notes (Signed)
D/C instructions given and reviewed. No questions voiced, encouraged to call with any concerns. IV removed and tolerated well. Dressing change and drain flush supplies given to pt.

## 2020-03-10 ENCOUNTER — Other Ambulatory Visit: Payer: Self-pay | Admitting: General Surgery

## 2020-03-10 DIAGNOSIS — K810 Acute cholecystitis: Secondary | ICD-10-CM

## 2020-03-11 LAB — AEROBIC/ANAEROBIC CULTURE W GRAM STAIN (SURGICAL/DEEP WOUND)

## 2020-03-15 ENCOUNTER — Telehealth: Payer: Self-pay | Admitting: Internal Medicine

## 2020-03-15 NOTE — Telephone Encounter (Signed)
Returned call to Shauna Hugh, Therapist, sports with [facility]. Verbal auth given for 2 week 1 and 1 week 3 to assist patient in managing new drain from gall bladder. Will route to PCP for agreement/denial. Hubbard Hartshorn, BSN, RN-BC

## 2020-03-15 NOTE — Telephone Encounter (Signed)
Agree 

## 2020-03-15 NOTE — Telephone Encounter (Signed)
Pls contact regarding VO [phone]

## 2020-03-16 ENCOUNTER — Encounter: Payer: Self-pay | Admitting: Internal Medicine

## 2020-03-16 ENCOUNTER — Other Ambulatory Visit: Payer: Self-pay

## 2020-03-16 ENCOUNTER — Ambulatory Visit (INDEPENDENT_AMBULATORY_CARE_PROVIDER_SITE_OTHER): Payer: Medicare HMO | Admitting: Internal Medicine

## 2020-03-16 VITALS — BP 128/75 | HR 74 | Temp 98.3°F | Ht 63.0 in | Wt 170.1 lb

## 2020-03-16 DIAGNOSIS — K810 Acute cholecystitis: Secondary | ICD-10-CM

## 2020-03-16 DIAGNOSIS — K819 Cholecystitis, unspecified: Secondary | ICD-10-CM

## 2020-03-16 DIAGNOSIS — I10 Essential (primary) hypertension: Secondary | ICD-10-CM | POA: Diagnosis not present

## 2020-03-16 DIAGNOSIS — N183 Chronic kidney disease, stage 3 unspecified: Secondary | ICD-10-CM

## 2020-03-16 NOTE — Assessment & Plan Note (Signed)
  Acalculous cholecystitis: He was recently admitted to the hospital from March 05, 2020 to March 09, 2020 for management of acalculus cholecystitis now status post percutaneous cholecystostomy by interventional radiology.  During his hospitalization was initially started on Zosyn, transitioned to Unasyn and subsequently discharged on Augmentin.  Today he states that he is doing well.  His abdominal pain has resolved.  He denies nausea, vomiting.  In regards to his percutaneous drain, he changes it every day and the drain is clear in color.  Plan: -Follow-up with general surgery and interventional radiology

## 2020-03-16 NOTE — Progress Notes (Signed)
   CC: Follow-up acalculous cholecystitis  HPI:  [name] is a 59 y.o. with medical history listed below who was recently admitted for management of a calculus cholecystitis here for follow-up.  Please see problem based charting for further details.  Past Medical History:  Diagnosis Date  . Anemia   . Anemia due to medication 09/24/2015  . Arthritis    "feet" (11/19/2015)  . CAD (coronary artery disease), native coronary artery    a. remote LAD stent 1990s. b. s/p DESx2 to LAD 11/2015  . Hepatitis A immune 11/20/2016  . Hepatitis B immune 11/20/2016  . Hepatitis C    "going thru the treatments" (11/19/2015)  . High cholesterol   . History of gout    "just recently" (11/19/2015)  . HIV (human immunodeficiency virus infection) ([place])   . Hypertension   . Posterior pain of left hip 03/01/2018  . Type II diabetes mellitus ([place])    diagnosed 04-2015   Review of Systems:  As per HPI  Physical Exam:  Vitals:   03/16/20 0906  BP: 128/75  Pulse: 74  Temp: 98.3 F (36.8 C)  TempSrc: Oral  SpO2: 99%  Weight: 170 lb 1.6 oz (77.2 kg)  Height: 5\' 3"  (1.6 m)   Physical Exam Vitals and nursing note reviewed.  Constitutional:      Appearance: He is not toxic-appearing.  Cardiovascular:     Rate and Rhythm: Normal rate.     Heart sounds: No murmur heard.   Pulmonary:     Breath sounds: No rales.  Abdominal:     General: Bowel sounds are normal. There is no distension.     Palpations: Abdomen is soft.     Tenderness: There is no abdominal tenderness. There is no guarding.       Comments: Percutaneous drain in place with no output in bag.  Neurological:     Mental Status: He is alert.  Psychiatric:        Mood and Affect: Mood normal.        Behavior: Behavior normal.     Assessment & Plan:   See Encounters Tab for problem based charting.  Patient discussed with Dr. Jimmye Norman

## 2020-03-16 NOTE — Assessment & Plan Note (Signed)
AoCKD3: Baseline sCr 1.4.  Was noted to have slightly elevated serum creatinine and mildly decreased GFR during his hospitalization.  Plan: -Follow BMP

## 2020-03-16 NOTE — Patient Instructions (Signed)
Mr. Carrero,  Thanks for seeing me today.  I am glad you are feeling well.  I have placed a referral for you to see the general surgeon.  Please follow-up with the interventional radiologist on October 19.  Take care! Dr. Eileen Stanford  Please call the internal medicine center clinic if you have any questions or concerns, we may be able to help and keep you from a long and expensive emergency room wait. Our clinic and after hours phone number is [phone], the best time to call is Monday through Friday 9 am to 4 pm but there is always someone available 24/7 if you have an emergency. If you need medication refills please notify your pharmacy one week in advance and they will send [place] a request.

## 2020-03-16 NOTE — Progress Notes (Signed)
Internal Medicine Clinic Attending  Case discussed with Dr. Agyei  At the time of the visit.  We reviewed the resident's history and exam and pertinent patient test results.  I agree with the assessment, diagnosis, and plan of care documented in the resident's note.  

## 2020-03-17 ENCOUNTER — Other Ambulatory Visit: Payer: Self-pay | Admitting: Internal Medicine

## 2020-03-17 ENCOUNTER — Ambulatory Visit (INDEPENDENT_AMBULATORY_CARE_PROVIDER_SITE_OTHER): Payer: Medicare HMO | Admitting: Student

## 2020-03-17 ENCOUNTER — Encounter: Payer: Self-pay | Admitting: Student

## 2020-03-17 ENCOUNTER — Other Ambulatory Visit: Payer: Self-pay

## 2020-03-17 VITALS — BP 122/73 | HR 83 | Temp 98.0°F | Ht 63.0 in | Wt 169.0 lb

## 2020-03-17 DIAGNOSIS — I10 Essential (primary) hypertension: Secondary | ICD-10-CM

## 2020-03-17 DIAGNOSIS — R55 Syncope and collapse: Secondary | ICD-10-CM

## 2020-03-17 DIAGNOSIS — R42 Dizziness and giddiness: Secondary | ICD-10-CM | POA: Diagnosis not present

## 2020-03-17 DIAGNOSIS — K819 Cholecystitis, unspecified: Secondary | ICD-10-CM

## 2020-03-17 LAB — CBC WITH DIFFERENTIAL/PLATELET
Abs Immature Granulocytes: 0.05 10*3/uL (ref 0.00–0.07)
Basophils Absolute: 0 10*3/uL (ref 0.0–0.1)
Basophils Relative: 1 %
Eosinophils Absolute: 0.2 10*3/uL (ref 0.0–0.5)
Eosinophils Relative: 2 %
HCT: 37.4 % — ABNORMAL LOW (ref 39.0–52.0)
Hemoglobin: 12.2 g/dL — ABNORMAL LOW (ref 13.0–17.0)
Immature Granulocytes: 1 %
Lymphocytes Relative: 34 %
Lymphs Abs: 2.7 10*3/uL (ref 0.7–4.0)
MCH: 30.3 pg (ref 26.0–34.0)
MCHC: 32.6 g/dL (ref 30.0–36.0)
MCV: 92.8 fL (ref 80.0–100.0)
Monocytes Absolute: 0.9 10*3/uL (ref 0.1–1.0)
Monocytes Relative: 11 %
Neutro Abs: 4.1 10*3/uL (ref 1.7–7.7)
Neutrophils Relative %: 51 %
Platelets: 600 10*3/uL — ABNORMAL HIGH (ref 150–400)
RBC: 4.03 MIL/uL — ABNORMAL LOW (ref 4.22–5.81)
RDW: 12.1 % (ref 11.5–15.5)
WBC: 7.8 10*3/uL (ref 4.0–10.5)
nRBC: 0 % (ref 0.0–0.2)

## 2020-03-17 LAB — COMPREHENSIVE METABOLIC PANEL
ALT: 47 U/L — ABNORMAL HIGH (ref 0–44)
AST: 48 U/L — ABNORMAL HIGH (ref 15–41)
Albumin: 3 g/dL — ABNORMAL LOW (ref 3.5–5.0)
Alkaline Phosphatase: 197 U/L — ABNORMAL HIGH (ref 38–126)
Anion gap: 10 (ref 5–15)
BUN: 21 mg/dL (ref 8–23)
CO2: 25 mmol/L (ref 22–32)
Calcium: 10.4 mg/dL — ABNORMAL HIGH (ref 8.9–10.3)
Chloride: 101 mmol/L (ref 98–111)
Creatinine, Ser: 1.21 mg/dL (ref 0.61–1.24)
GFR calc Af Amer: >60 mL/min (ref >60)
GFR calc non Af Amer: >60 mL/min (ref >60)
Glucose, Bld: 91 mg/dL (ref 70–99)
Potassium: 4.3 mmol/L (ref 3.5–5.1)
Sodium: 136 mmol/L (ref 135–145)
Total Bilirubin: 0.5 mg/dL (ref 0.3–1.2)
Total Protein: 7.7 g/dL (ref 6.5–8.1)

## 2020-03-17 LAB — GLUCOSE, CAPILLARY: Glucose-Capillary: 157 mg/dL — ABNORMAL HIGH (ref 70–99)

## 2020-03-17 LAB — BMP8+ANION GAP
Anion Gap: 18 mmol/L (ref 10.0–18.0)
BUN/Creatinine Ratio: 16 (ref 10–24)
BUN: 23 mg/dL (ref 8–27)
CO2: 21 mmol/L (ref 20–29)
Calcium: 10.8 mg/dL — ABNORMAL HIGH (ref 8.6–10.2)
Chloride: 97 mmol/L (ref 96–106)
Creatinine, Ser: 1.43 mg/dL — ABNORMAL HIGH (ref 0.76–1.27)
GFR calc Af Amer: 61 mL/min/{1.73_m2} (ref >59)
GFR calc non Af Amer: 52 mL/min/{1.73_m2} — ABNORMAL LOW (ref >59)
Glucose: 107 mg/dL — ABNORMAL HIGH (ref 65–99)
Potassium: 5.8 mmol/L — ABNORMAL HIGH (ref 3.5–5.2)
Sodium: 136 mmol/L (ref 134–144)

## 2020-03-17 LAB — LACTIC ACID, PLASMA: Lactic Acid, Venous: 1.1 mmol/L (ref 0.5–1.9)

## 2020-03-17 NOTE — Addendum Note (Signed)
Addended by: Truddie Crumble on: 03/17/2020 02:40 PM   Modules accepted: Orders

## 2020-03-17 NOTE — Patient Instructions (Signed)
John Dickerson  It was a pleasure seeing you today!  Today you had a spell prior to drawing blood. Your blood work looks good and your blood pressure is perfect! If this happens again, make sure to sit down or lay down and rest. If you continue to have these problems, let us know and we can evaluate further.  We look forward to seeing you next time. Please call our clinic at [phone] if you have any questions or concerns. The best time to call is Monday-Friday from 9am-4pm, but there is someone available 24/7 at the same number. If you need medication refills, please notify your pharmacy one week in advance and they will send [place] a request.  Thank you for letting us take part in your care. Wishing you the best!  Thank you, Dr. Sanjuan Dame, MD

## 2020-03-19 DIAGNOSIS — R55 Syncope and collapse: Secondary | ICD-10-CM | POA: Insufficient documentation

## 2020-03-19 NOTE — Assessment & Plan Note (Signed)
Patient presents to clinic today for lab-only appointment. States once he sat down in the office after walking from parking lot, he felt lightheaded, hot, and experienced epigastric abdominal pain. Denies previous similar episodes, fevers, chest pain, palpitations. Described abdominal pain similar to GERD. Mentions he ate breakfast this morning, including oatmeal and chips. Says the episode lasted a few minutes, but now feels back to baseline.  A/P: -Glucose 156. Blood pressure 122/73 -Patient recently admitted for acalculous cholecystitis, currently has drain in RUQ. -Stat CBC, CMP without marked anemia or electrolyte abnormalities -Likely patient experienced vasovagal episode. Discussed if patient feels similar in the future to sit or lay down until feeling passes. Instructed patient if he continues to have further episodes to make an appointment with the clinic.

## 2020-03-19 NOTE — Progress Notes (Signed)
   CC: pre-syncopal episode  HPI:  [name] is a 59 y.o. who presents to clinic for lab-only appointment with pre-syncopal episode while in office.  Please see problem-based list for further evaluation and detailed planning.  Past Medical History:  Diagnosis Date  . Anemia   . Anemia due to medication 09/24/2015  . Arthritis    "feet" (11/19/2015)  . CAD (coronary artery disease), native coronary artery    a. remote LAD stent 1990s. b. s/p DESx2 to LAD 11/2015  . Hepatitis A immune 11/20/2016  . Hepatitis B immune 11/20/2016  . Hepatitis C    "going thru the treatments" (11/19/2015)  . High cholesterol   . History of gout    "just recently" (11/19/2015)  . HIV (human immunodeficiency virus infection) ([place])   . Hypertension   . Posterior pain of left hip 03/01/2018  . Type II diabetes mellitus ([place])    diagnosed 04-2015   Review of Systems:  As per HPI  Physical Exam:  Vitals:   03/17/20 1401  BP: 122/73  Pulse: 83  Temp: 98 F (36.7 C)  TempSrc: Oral  SpO2: 99%  Weight: 169 lb (76.7 kg)  Height: 5\' 3"  (1.6 m)   General: Pleasant, sitting in chair, no acute distress. CV: Regular rate, rhythm. No murmurs, gallops, rubs. Abd: Soft, non-distended, non-tender. RUQ drain covered with bandage, no warmth or erythema.  Assessment & Plan:   See Encounters Tab for problem based charting.  Patient seen with Dr. Jimmye Norman.

## 2020-03-22 ENCOUNTER — Telehealth: Payer: Self-pay | Admitting: Internal Medicine

## 2020-03-22 ENCOUNTER — Other Ambulatory Visit (HOSPITAL_COMMUNITY): Payer: Self-pay | Admitting: General Surgery

## 2020-03-22 ENCOUNTER — Other Ambulatory Visit: Payer: Self-pay | Admitting: Radiology

## 2020-03-22 ENCOUNTER — Other Ambulatory Visit (HOSPITAL_COMMUNITY)
Admission: RE | Admit: 2020-03-22 | Discharge: 2020-03-22 | Disposition: A | Discharge status: Home / Self Care | Payer: Medicare HMO | Source: Ambulatory Visit | Attending: Infectious Diseases | Admitting: Infectious Diseases

## 2020-03-22 ENCOUNTER — Other Ambulatory Visit: Payer: Medicare HMO

## 2020-03-22 ENCOUNTER — Other Ambulatory Visit: Payer: Self-pay

## 2020-03-22 DIAGNOSIS — Z113 Encounter for screening for infections with a predominantly sexual mode of transmission: Secondary | ICD-10-CM | POA: Diagnosis present

## 2020-03-22 DIAGNOSIS — B20 Human immunodeficiency virus [HIV] disease: Secondary | ICD-10-CM

## 2020-03-22 DIAGNOSIS — T85518A Breakdown (mechanical) of other gastrointestinal prosthetic devices, implants and grafts, initial encounter: Secondary | ICD-10-CM

## 2020-03-22 DIAGNOSIS — E782 Mixed hyperlipidemia: Secondary | ICD-10-CM

## 2020-03-22 NOTE — Progress Notes (Signed)
Internal Medicine Clinic Attending  I saw and evaluated the patient.  I personally confirmed the key portions of the history and exam documented by Dr. Braswell and I reviewed pertinent patient test results.  The assessment, diagnosis, and plan were formulated together and I agree with the documentation in the resident's note.  

## 2020-03-22 NOTE — Telephone Encounter (Addendum)
   Reason for call:   I received a call from [name] at AM indicating he has some bloody drainage in the bag that is attached to his abdomen.   Pertinent Data:   No other symptoms.  His only concern is some bloody drainage from the drain since yesterday.  No GI symptoms, denies lightheadedness or dizziness.  He mentions that he feels very well.   Assessment / Plan / Recommendations:   Patient was admitted on hospital 9/17-9/21 for acute acalculous cholecystitis.  He is a status post percutaneous cholecystostomy and has had a drain placed.  He is supposed to follow-up with surgery and IR in few weeks.  He noticed some bloody drainage in the bag yesterday.  He denies any nausea vomiting, abdominal pain, change in appetite.  He was seen in clinic 3 days ago and had some dizziness/presyncopal episode but stat CBC and BMP were unremarkable.  Denies any symptoms in the past 2 days.  I called IR clinic to see if patient can get a sooner appointment there.  Clinic coordinator call patient and ask him about the finding again.  She tells me that this can be common draining tube and was not concerned about major complication but IR will evaluate patient tomorrow in [place] [facility]. (Patient received an appointment for 1 PM tomorrow and with interventional radiologist and [place] [facility]).  I called patient in few hours to check on him and make sure he is aware of the plan.  He endorses that he is doing well, does not have any dizziness, headache, nausea, vomiting, abdominal pain, diarrhea, constipation or abdominal distention.  He had a small amount of bloody drainage in the bag since this morning and since we talked.     He does not have any symptoms concerning for acute intra-abdominal pathology to be evaluated promptly in ED, however some bloody drainage, I would like him to be evaluated by IR tomorrow. (IR coordinator clinic nurse mentions that, this finding can be common in setting of  tissue/skin irritation around the drain.   Patient is agreeable with above plan and will follow up in IR clinic tomorrow.  No further significant bleeding currently, but he is aware that he should call us again if he experiences significant bleeding or new symptoms.  He verbalizes understanding.   As always, pt is advised that if symptoms worsen or new symptoms  (such as dizziness, abdominal pain significant blood), arise, they should go to ER for further evaluation.   John Hatch, MD   03/22/2020, 4:14 PM

## 2020-03-23 ENCOUNTER — Ambulatory Visit (HOSPITAL_COMMUNITY)
Admission: RE | Admit: 2020-03-23 | Discharge: 2020-03-23 | Disposition: A | Discharge status: Home / Self Care | Payer: Medicare HMO | Source: Ambulatory Visit | Attending: General Surgery | Admitting: General Surgery

## 2020-03-23 ENCOUNTER — Other Ambulatory Visit (HOSPITAL_COMMUNITY): Payer: Self-pay | Admitting: General Surgery

## 2020-03-23 ENCOUNTER — Other Ambulatory Visit: Payer: Self-pay | Admitting: Internal Medicine

## 2020-03-23 ENCOUNTER — Telehealth: Payer: Self-pay | Admitting: *Deleted

## 2020-03-23 DIAGNOSIS — T85518A Breakdown (mechanical) of other gastrointestinal prosthetic devices, implants and grafts, initial encounter: Secondary | ICD-10-CM | POA: Insufficient documentation

## 2020-03-23 DIAGNOSIS — Y839 Surgical procedure, unspecified as the cause of abnormal reaction of the patient, or of later complication, without mention of misadventure at the time of the procedure: Secondary | ICD-10-CM | POA: Diagnosis not present

## 2020-03-23 HISTORY — PX: IR CHOLANGIOGRAM EXISTING TUBE: IMG6040

## 2020-03-23 LAB — URINE CYTOLOGY ANCILLARY ONLY
Chlamydia: NEGATIVE
Comment: NEGATIVE
Comment: NORMAL
Neisseria Gonorrhea: NEGATIVE

## 2020-03-23 LAB — T-HELPER CELL (CD4) - (RCID CLINIC ONLY)
CD4 % Helper T Cell: 33 % (ref 33–65)
CD4 T Cell Abs: 716 /uL (ref 400–1790)

## 2020-03-23 MED ORDER — IOHEXOL 300 MG/ML  SOLN
50.0000 mL | Freq: Once | INTRAMUSCULAR | Status: AC | PRN
  Administered 2020-03-23: 20 mL

## 2020-03-23 NOTE — Telephone Encounter (Signed)
Relayed results and plan to patient. He will come Thursday morning for redraw. Please advise if he needs to change any medication. Landis Gandy, RN

## 2020-03-23 NOTE — Addendum Note (Signed)
Addended by: Jean Rosenthal on: 03/23/2020 02:47 PM   Modules accepted: Orders

## 2020-03-23 NOTE — Telephone Encounter (Signed)
-----   Message from Campbell Riches, MD sent at 03/23/2020  1:56 PM EDT ----- Needs repeat potassium thanks

## 2020-03-24 ENCOUNTER — Encounter: Payer: Self-pay | Admitting: Internal Medicine

## 2020-03-24 ENCOUNTER — Other Ambulatory Visit: Payer: Self-pay | Admitting: Internal Medicine

## 2020-03-24 ENCOUNTER — Telehealth: Payer: Self-pay | Admitting: Internal Medicine

## 2020-03-24 LAB — CBC
HCT: 37 % — ABNORMAL LOW (ref 38.5–50.0)
Hemoglobin: 12.6 g/dL — ABNORMAL LOW (ref 13.2–17.1)
MCH: 31.1 pg (ref 27.0–33.0)
MCHC: 34.1 g/dL (ref 32.0–36.0)
MCV: 91.4 fL (ref 80.0–100.0)
MPV: 9.1 fL (ref 7.5–12.5)
Platelets: 607 10*3/uL — ABNORMAL HIGH (ref 140–400)
RBC: 4.05 10*6/uL — ABNORMAL LOW (ref 4.20–5.80)
RDW: 11.6 % (ref 11.0–15.0)
WBC: 8.9 10*3/uL (ref 3.8–10.8)

## 2020-03-24 LAB — COMPREHENSIVE METABOLIC PANEL
AG Ratio: 1.1 (calc) (ref 1.0–2.5)
ALT: 141 U/L — ABNORMAL HIGH (ref 9–46)
AST: 34 U/L (ref 10–35)
Albumin: 3.9 g/dL (ref 3.6–5.1)
Alkaline phosphatase (APISO): 372 U/L — ABNORMAL HIGH (ref 35–144)
BUN/Creatinine Ratio: 16 (calc) (ref 6–22)
BUN: 21 mg/dL (ref 7–25)
CO2: 29 mmol/L (ref 20–32)
Calcium: 10.9 mg/dL — ABNORMAL HIGH (ref 8.6–10.3)
Chloride: 99 mmol/L (ref 98–110)
Creat: 1.31 mg/dL — ABNORMAL HIGH (ref 0.70–1.25)
Globulin: 3.7 g/dL (calc) (ref 1.9–3.7)
Glucose, Bld: 124 mg/dL — ABNORMAL HIGH (ref 65–99)
Potassium: 5.7 mmol/L — ABNORMAL HIGH (ref 3.5–5.3)
Sodium: 137 mmol/L (ref 135–146)
Total Bilirubin: 0.4 mg/dL (ref 0.2–1.2)
Total Protein: 7.6 g/dL (ref 6.1–8.1)

## 2020-03-24 LAB — LIPID PANEL
Cholesterol: 112 mg/dL (ref <200)
HDL: 37 mg/dL — ABNORMAL LOW (ref >=40)
LDL Cholesterol (Calc): 53 mg/dL (calc)
Non-HDL Cholesterol (Calc): 75 mg/dL (calc) (ref <130)
Total CHOL/HDL Ratio: 3 (calc) (ref <5.0)
Triglycerides: 135 mg/dL (ref <150)

## 2020-03-24 LAB — HIV-1 RNA QUANT-NO REFLEX-BLD
HIV 1 RNA Quant: <20 Copies/mL
HIV-1 RNA Quant, Log: <1.3 Log cps/mL

## 2020-03-24 NOTE — Telephone Encounter (Signed)
Pls contact pt [phone]; pt forgot what medicine the dr told him to stop taking

## 2020-03-24 NOTE — Telephone Encounter (Signed)
Ok I will call him. Thanks

## 2020-03-24 NOTE — Telephone Encounter (Signed)
TC to patient and relayed Dr. Nelia Shi message belowfrom earlier today.  He read back the name of the medication he is to stop taking and verbalized understanding. Burgundy Matuszak, RN,BSN     Hi K+ during recent ID visit was 5.7. I will have him STOP aldactone and repeat labs which has already been scheduled by ID.   In regards to BP regimen while he is off spironolactone, we can consider addition of thiazide diuretic depending on how his BP looks at clinic follow up.

## 2020-03-24 NOTE — Progress Notes (Signed)
Hi K+ during recent ID visit was 5.7. I will have him STOP aldactone and repeat labs which has already been scheduled by ID.   In regards to BP regimen while he is off spironolactone, we can consider addition of thiazide diuretic depending on how his BP looks at clinic follow up.

## 2020-03-25 ENCOUNTER — Other Ambulatory Visit: Payer: Self-pay

## 2020-03-25 ENCOUNTER — Other Ambulatory Visit: Payer: Medicare HMO

## 2020-03-25 DIAGNOSIS — E875 Hyperkalemia: Secondary | ICD-10-CM

## 2020-03-26 LAB — COMPLETE METABOLIC PANEL WITH GFR
AG Ratio: 1.2 (calc) (ref 1.0–2.5)
ALT: 63 U/L — ABNORMAL HIGH (ref 9–46)
AST: 22 U/L (ref 10–35)
Albumin: 3.9 g/dL (ref 3.6–5.1)
Alkaline phosphatase (APISO): 277 U/L — ABNORMAL HIGH (ref 35–144)
BUN/Creatinine Ratio: 21 (calc) (ref 6–22)
BUN: 26 mg/dL — ABNORMAL HIGH (ref 7–25)
CO2: 31 mmol/L (ref 20–32)
Calcium: 11.2 mg/dL — ABNORMAL HIGH (ref 8.6–10.3)
Chloride: 100 mmol/L (ref 98–110)
Creat: 1.24 mg/dL (ref 0.70–1.25)
GFR, Est African American: 72 mL/min/{1.73_m2} (ref >=60)
GFR, Est Non African American: 62 mL/min/{1.73_m2} (ref >=60)
Globulin: 3.2 g/dL (calc) (ref 1.9–3.7)
Glucose, Bld: 109 mg/dL — ABNORMAL HIGH (ref 65–99)
Potassium: 5.6 mmol/L — ABNORMAL HIGH (ref 3.5–5.3)
Sodium: 138 mmol/L (ref 135–146)
Total Bilirubin: 0.4 mg/dL (ref 0.2–1.2)
Total Protein: 7.1 g/dL (ref 6.1–8.1)

## 2020-03-30 ENCOUNTER — Encounter: Payer: Self-pay | Admitting: Internal Medicine

## 2020-03-30 ENCOUNTER — Other Ambulatory Visit: Payer: Self-pay

## 2020-03-30 ENCOUNTER — Ambulatory Visit (INDEPENDENT_AMBULATORY_CARE_PROVIDER_SITE_OTHER): Payer: Medicare HMO | Admitting: Internal Medicine

## 2020-03-30 VITALS — BP 126/78 | HR 86 | Temp 98.1°F | Ht 63.0 in | Wt 169.8 lb

## 2020-03-30 DIAGNOSIS — E1159 Type 2 diabetes mellitus with other circulatory complications: Secondary | ICD-10-CM

## 2020-03-30 DIAGNOSIS — E1165 Type 2 diabetes mellitus with hyperglycemia: Secondary | ICD-10-CM

## 2020-03-30 DIAGNOSIS — E1122 Type 2 diabetes mellitus with diabetic chronic kidney disease: Secondary | ICD-10-CM

## 2020-03-30 DIAGNOSIS — N521 Erectile dysfunction due to diseases classified elsewhere: Secondary | ICD-10-CM | POA: Diagnosis not present

## 2020-03-30 DIAGNOSIS — N183 Chronic kidney disease, stage 3 unspecified: Secondary | ICD-10-CM | POA: Diagnosis not present

## 2020-03-30 DIAGNOSIS — Z0000 Encounter for general adult medical examination without abnormal findings: Secondary | ICD-10-CM

## 2020-03-30 DIAGNOSIS — I10 Essential (primary) hypertension: Secondary | ICD-10-CM

## 2020-03-30 MED ORDER — LOSARTAN POTASSIUM 50 MG PO TABS: 50.0000 mg | ORAL_TABLET | Freq: Every day | ORAL | 3 refills | Status: AC

## 2020-03-30 NOTE — Assessment & Plan Note (Addendum)
John Dickerson is a 59 yo M w/ PMH of CKD3, T2DM, HTN, HIV on Biktarvy and recent admission for acalculous cholecystitis presenting to [facility] for follow up management of his chronic conditions. He mentions he feels well since his last visit but was told to return to clinic for repeat labs due to persistent high potassium. He denies any chest pain, palpitations, numbness, tingling or cramps. He did have 1 episode of near syncope when he came for repeat labs last visit. He mentions discontinuing spironolactone as instructed but takes all of his other medications as prescribed.  A/P Noted to have worsening hypercalcemia on recent labs. Likely due to secondary hyperparathyroidism from renal disease. Not severe enough to suggest MM but on the differential. Could also be medication induced although not on thiazides, lithium, vitamin or calcium supplements. Will check PTH to confirm. - CMP - PTH - Discussed sxs of hypercalcemia

## 2020-03-30 NOTE — Assessment & Plan Note (Addendum)
BP Readings from Last 3 Encounters:  03/30/20 126/78  03/17/20 122/73  03/16/20 128/75   At goal. Had episode of near-syncopal event last clinic visit. Denies any headache, blurry vision, chest pain, palpitations. Currently on amlodipine 10mg , losartan 100mg . Previously on spironolactone but discontinued due to hyperkalemia. Insetting of persistent hyperkalemia and hx of near syncopal episode event 2 weeks prior, will decrease losartan dose. Not a candidate for thiazide diuretics due to hypercalcemia  - Reduce losartan dose from 100mg  to 50mg  - C/w amlodipine 10mg 

## 2020-03-30 NOTE — Assessment & Plan Note (Signed)
Lab Results  Component Value Date   HGBA1C 6.4 (A) 03/03/2020   At goal. Currently on metformin 1000mg  daily. Mentions not checking blood sugars at home due to pain at his finger tips. Mentions wanting further education on proper monitoring of glucose at home.  - Discussed methods to check blood sugars - Referral to [facility] for MNT

## 2020-03-30 NOTE — Assessment & Plan Note (Signed)
Currently vaccinated against COVID and influenza. Discussed timeline for receiving 3rd booster shot considering his hx of HIV

## 2020-03-30 NOTE — Patient Instructions (Signed)
Thank you for allowing [place] to provide your care today. Today we discussed your high potassium    I have ordered cmp, pth labs for you. I will call if any are abnormal.    Today we made the following changes to your medications.    Please reduce your losartan dose to 50mg  daily  Please follow-up in 3 months unless the labs are abnormal.    Should you have any questions or concerns please call the internal medicine clinic at [phone].     DASH Eating Plan DASH stands for "Dietary Approaches to Stop Hypertension." The DASH eating plan is a healthy eating plan that has been shown to reduce high blood pressure (hypertension). It may also reduce your risk for type 2 diabetes, heart disease, and stroke. The DASH eating plan may also help with weight loss. What are tips for following this plan?  General guidelines  Avoid eating more than 2,300 mg (milligrams) of salt (sodium) a day. If you have hypertension, you may need to reduce your sodium intake to 1,500 mg a day.  Limit alcohol intake to no more than 1 drink a day for nonpregnant women and 2 drinks a day for men. One drink equals 12 oz of beer, 5 oz of wine, or 1 oz of hard liquor.  Work with your health care provider to maintain a healthy body weight or to lose weight. Ask what an ideal weight is for you.  Get at least 30 minutes of exercise that causes your heart to beat faster (aerobic exercise) most days of the week. Activities may include walking, swimming, or biking.  Work with your health care provider or diet and nutrition specialist (dietitian) to adjust your eating plan to your individual calorie needs. Reading food labels   Check food labels for the amount of sodium per serving. Choose foods with less than 5 percent of the Daily Value of sodium. Generally, foods with less than 300 mg of sodium per serving fit into this eating plan.  To find whole grains, look for the word "whole" as the first word in the ingredient  list. Shopping  Buy products labeled as "low-sodium" or "no salt added."  Buy fresh foods. Avoid canned foods and premade or frozen meals. Cooking  Avoid adding salt when cooking. Use salt-free seasonings or herbs instead of table salt or sea salt. Check with your health care provider or pharmacist before using salt substitutes.  Do not fry foods. Cook foods using healthy methods such as baking, boiling, grilling, and broiling instead.  Cook with heart-healthy oils, such as olive, canola, soybean, or sunflower oil. Meal planning  Eat a balanced diet that includes: ? 5 or more servings of fruits and vegetables each day. At each meal, try to fill half of your plate with fruits and vegetables. ? Up to 6-8 servings of whole grains each day. ? Less than 6 oz of lean meat, poultry, or fish each day. A 3-oz serving of meat is about the same size as a deck of cards. One egg equals 1 oz. ? 2 servings of low-fat dairy each day. ? A serving of nuts, seeds, or beans 5 times each week. ? Heart-healthy fats. Healthy fats called Omega-3 fatty acids are found in foods such as flaxseeds and coldwater fish, like sardines, salmon, and mackerel.  Limit how much you eat of the following: ? Canned or prepackaged foods. ? Food that is high in trans fat, such as fried foods. ? Food that is high  in saturated fat, such as fatty meat. ? Sweets, desserts, sugary drinks, and other foods with added sugar. ? Full-fat dairy products.  Do not salt foods before eating.  Try to eat at least 2 vegetarian meals each week.  Eat more home-cooked food and less restaurant, buffet, and fast food.  When eating at a restaurant, ask that your food be prepared with less salt or no salt, if possible. What foods are recommended? The items listed may not be a complete list. Talk with your dietitian about what dietary choices are best for you. Grains Whole-grain or whole-wheat bread. Whole-grain or whole-wheat pasta. Brown  rice. [place]. Bulgur. Whole-grain and low-sodium cereals. Pita bread. Low-fat, low-sodium crackers. Whole-wheat flour tortillas. Vegetables Fresh or frozen vegetables (raw, steamed, roasted, or grilled). Low-sodium or reduced-sodium tomato and vegetable juice. Low-sodium or reduced-sodium tomato sauce and tomato paste. Low-sodium or reduced-sodium canned vegetables. Fruits All fresh, dried, or frozen fruit. Canned fruit in natural juice (without added sugar). Meat and other protein foods Skinless chicken or [place]. Ground chicken or [place]. Pork with fat trimmed off. Fish and seafood. Egg whites. Dried beans, peas, or lentils. Unsalted nuts, nut butters, and seeds. Unsalted canned beans. Lean cuts of beef with fat trimmed off. Low-sodium, lean deli meat. Dairy Low-fat (1%) or fat-free (skim) milk. Fat-free, low-fat, or reduced-fat cheeses. Nonfat, low-sodium ricotta or cottage cheese. Low-fat or nonfat yogurt. Low-fat, low-sodium cheese. Fats and oils Soft margarine without trans fats. Vegetable oil. Low-fat, reduced-fat, or light mayonnaise and salad dressings (reduced-sodium). Canola, safflower, olive, soybean, and sunflower oils. Avocado. Seasoning and other foods Herbs. Spices. Seasoning mixes without salt. Unsalted popcorn and pretzels. Fat-free sweets. What foods are not recommended? The items listed may not be a complete list. Talk with your dietitian about what dietary choices are best for you. Grains Baked goods made with fat, such as croissants, muffins, or some breads. Dry pasta or rice meal packs. Vegetables Creamed or fried vegetables. Vegetables in a cheese sauce. Regular canned vegetables (not low-sodium or reduced-sodium). Regular canned tomato sauce and paste (not low-sodium or reduced-sodium). Regular tomato and vegetable juice (not low-sodium or reduced-sodium). Angie Fava. Olives. Fruits Canned fruit in a light or heavy syrup. Fried fruit. Fruit in cream or butter  sauce. Meat and other protein foods Fatty cuts of meat. Ribs. Fried meat. Berniece Salines. Sausage. Bologna and other processed lunch meats. Salami. Fatback. Hotdogs. Bratwurst. Salted nuts and seeds. Canned beans with added salt. Canned or smoked fish. Whole eggs or egg yolks. Chicken or [place] with skin. Dairy Whole or 2% milk, cream, and half-and-half. Whole or full-fat cream cheese. Whole-fat or sweetened yogurt. Full-fat cheese. Nondairy creamers. Whipped toppings. Processed cheese and cheese spreads. Fats and oils Butter. Stick margarine. Lard. Shortening. Ghee. Bacon fat. Tropical oils, such as coconut, palm kernel, or palm oil. Seasoning and other foods Salted popcorn and pretzels. Onion salt, garlic salt, seasoned salt, table salt, and sea salt. Worcestershire sauce. Tartar sauce. Barbecue sauce. Teriyaki sauce. Soy sauce, including reduced-sodium. Steak sauce. Canned and packaged gravies. Fish sauce. Oyster sauce. Cocktail sauce. Horseradish that you find on the shelf. Ketchup. Mustard. Meat flavorings and tenderizers. Bouillon cubes. Hot sauce and Tabasco sauce. Premade or packaged marinades. Premade or packaged taco seasonings. Relishes. Regular salad dressings. Where to find more information:  National Heart, Lung, and [place]: https://wilson-eaton.com/  American Heart Association: www.heart.org Summary  The DASH eating plan is a healthy eating plan that has been shown to reduce high blood pressure (hypertension). It may  also reduce your risk for type 2 diabetes, heart disease, and stroke.  With the DASH eating plan, you should limit salt (sodium) intake to 2,300 mg a day. If you have hypertension, you may need to reduce your sodium intake to 1,500 mg a day.  When on the DASH eating plan, aim to eat more fresh fruits and vegetables, whole grains, lean proteins, low-fat dairy, and heart-healthy fats.  Work with your health care provider or diet and nutrition specialist (dietitian) to adjust  your eating plan to your individual calorie needs. This information is not intended to replace advice given to you by your health care provider. Make sure you discuss any questions you have with your health care provider. Document Revised: 05/18/2017 Document Reviewed: 05/29/2016 Elsevier Patient Education  2020 [employer].

## 2020-03-30 NOTE — Assessment & Plan Note (Addendum)
Lab Results  Component Value Date   CREATININE 1.24 03/25/2020   CREATININE 1.31 (H) 03/22/2020   CREATININE 1.21 03/17/2020   Presents with history of CKD3a. Renal fx checked 5 days prior at baseline. Currently appear euvolemic. Noted mild hyperkalemia with K of 5.7 improved to 5.6 after d/cing spironolactone. Currently denies any palpitations, cramps, numbness, tingling.   - Re-check renal fx today

## 2020-03-30 NOTE — Progress Notes (Signed)
CC: Blood pressure  HPI: [name] is a 59 y.o. with PMH listed below presenting with complaint of high blood pressure. Please see problem based assessment and plan for further details.  Past Medical History:  Diagnosis Date  . Anemia   . Anemia due to medication 09/24/2015  . Arthritis    "feet" (11/19/2015)  . CAD (coronary artery disease), native coronary artery    a. remote LAD stent 1990s. b. s/p DESx2 to LAD 11/2015  . Hepatitis A immune 11/20/2016  . Hepatitis B immune 11/20/2016  . Hepatitis C    "going thru the treatments" (11/19/2015)  . High cholesterol   . History of gout    "just recently" (11/19/2015)  . HIV (human immunodeficiency virus infection) ([place])   . Hypertension   . Posterior pain of left hip 03/01/2018  . Type II diabetes mellitus ([place])    diagnosed 04-2015   Review of Systems: Review of Systems  Constitutional: Negative for chills, fever and malaise/fatigue.  HENT: Negative for hearing loss.   Eyes: Negative for blurred vision.  Respiratory: Negative for shortness of breath.   Cardiovascular: Negative for chest pain, palpitations and leg swelling.  Gastrointestinal: Negative for abdominal pain, constipation, diarrhea, nausea and vomiting.  Genitourinary: Negative for dysuria.  Neurological: Negative for dizziness, sensory change and headaches.     Physical Exam: Vitals:   03/30/20 0909  BP: 126/78  Pulse: 86  Temp: 98.1 F (36.7 C)  TempSrc: Oral  SpO2: 98%  Weight: 169 lb 12.8 oz (77 kg)  Height: 5\' 3"  (1.6 m)   Gen: Well-developed, well nourished, NAD HEENT: NCAT head, hearing intact CV: RRR, S1, S2 normal Pulm: CTAB, No rales, no wheezes Abd: NTND, BS+, Stable cholecystomy drain with sanguinous drainage. Drain site appear clean without surrounding erythema, tenderness, edema or leakage Extm: ROM intact, Peripheral pulses intact, No peripheral edema Skin: Dry, Warm, normal turgor  Assessment & Plan:   Essential hypertension BP  Readings from Last 3 Encounters:  03/30/20 126/78  03/17/20 122/73  03/16/20 128/75   At goal. Had episode of near-syncopal event last clinic visit. Denies any headache, blurry vision, chest pain, palpitations. Currently on amlodipine 10mg , losartan 100mg . Previously on spironolactone but discontinued due to hyperkalemia. Insetting of persistent hyperkalemia and hx of near syncopal episode event 2 weeks prior, will decrease losartan dose. Not a candidate for thiazide diuretics due to hypercalcemia  - Reduce losartan dose from 100mg  to 50mg  - C/w amlodipine 10mg    CKD (chronic kidney disease) stage 3, GFR 30-59 ml/min (HCC) Lab Results  Component Value Date   CREATININE 1.24 03/25/2020   CREATININE 1.31 (H) 03/22/2020   CREATININE 1.21 03/17/2020   Presents with history of CKD3a. Renal fx checked 5 days prior at baseline. Currently appear euvolemic. Noted mild hyperkalemia with K of 5.7 improved to 5.6 after d/cing spironolactone. Currently denies any palpitations, cramps, numbness, tingling.   - Re-check renal fx today  Preventative health care Currently vaccinated against COVID and influenza. Discussed timeline for receiving 3rd booster shot considering his hx of HIV  Type 2 diabetes mellitus with circulatory disorder causing erectile dysfunction (HCC) Lab Results  Component Value Date   HGBA1C 6.4 (A) 03/03/2020   At goal. Currently on metformin 1000mg  daily. Mentions not checking blood sugars at home due to pain at his finger tips. Mentions wanting further education on proper monitoring of glucose at home.  - Discussed methods to check blood sugars - Referral to [facility] for MNT  Hypercalcemia [name] is a 59 yo M w/ PMH of CKD3, T2DM, HTN, HIV on Biktarvy and recent admission for acalculous cholecystitis presenting to [facility] for follow up management of his chronic conditions. He mentions he feels well since his last visit but was told to return to clinic for repeat labs due to  persistent high potassium. He denies any chest pain, palpitations, numbness, tingling or cramps. He did have 1 episode of near syncope when he came for repeat labs last visit. He mentions discontinuing spironolactone as instructed but takes all of his other medications as prescribed.  A/P Noted to have worsening hypercalcemia on recent labs. Likely due to secondary hyperparathyroidism from renal disease. Not severe enough to suggest MM but on the differential. Could also be medication induced although not on thiazides, lithium, vitamin or calcium supplements. Will check PTH to confirm. - CMP - PTH - Discussed sxs of hypercalcemia    Patient discussed with Dr. Jimmye Norman  -John Dickerson, [facility] Pager: [phone]

## 2020-03-31 LAB — CMP14 + ANION GAP
ALT: 29 IU/L (ref 0–44)
AST: 18 IU/L (ref 0–40)
Albumin/Globulin Ratio: 1.3 (ref 1.2–2.2)
Albumin: 4.2 g/dL (ref 3.8–4.8)
Alkaline Phosphatase: 250 IU/L — ABNORMAL HIGH (ref 44–121)
Anion Gap: 16 mmol/L (ref 10.0–18.0)
BUN/Creatinine Ratio: 18 (ref 10–24)
BUN: 20 mg/dL (ref 8–27)
Bilirubin Total: 0.2 mg/dL (ref 0.0–1.2)
CO2: 25 mmol/L (ref 20–29)
Calcium: 10.4 mg/dL — ABNORMAL HIGH (ref 8.6–10.2)
Chloride: 97 mmol/L (ref 96–106)
Creatinine, Ser: 1.1 mg/dL (ref 0.76–1.27)
GFR calc Af Amer: 83 mL/min/{1.73_m2} (ref >59)
GFR calc non Af Amer: 72 mL/min/{1.73_m2} (ref >59)
Globulin, Total: 3.2 g/dL (ref 1.5–4.5)
Glucose: 126 mg/dL — ABNORMAL HIGH (ref 65–99)
Potassium: 4.8 mmol/L (ref 3.5–5.2)
Sodium: 138 mmol/L (ref 134–144)
Total Protein: 7.4 g/dL (ref 6.0–8.5)

## 2020-03-31 LAB — PTH, INTACT AND CALCIUM
Calcium: 10.2 mg/dL (ref 8.6–10.2)
PTH: 13 pg/mL — ABNORMAL LOW (ref 15–65)

## 2020-04-02 ENCOUNTER — Telehealth: Payer: Self-pay | Admitting: Internal Medicine

## 2020-04-02 NOTE — Progress Notes (Signed)
Internal Medicine Clinic Attending  Case discussed with Dr. Lee  At the time of the visit.  We reviewed the resident's history and exam and pertinent patient test results.  I agree with the assessment, diagnosis, and plan of care documented in the resident's note.    

## 2020-04-02 NOTE — Telephone Encounter (Signed)
Attempted to call [name] regarding lab results. Patient did not pick up. Left voicemail with callback number.

## 2020-04-04 ENCOUNTER — Other Ambulatory Visit: Payer: Self-pay | Admitting: Interventional Cardiology

## 2020-04-04 DIAGNOSIS — E781 Pure hyperglyceridemia: Secondary | ICD-10-CM

## 2020-04-06 ENCOUNTER — Ambulatory Visit
Admission: RE | Admit: 2020-04-06 | Discharge: 2020-04-06 | Disposition: A | Discharge status: Home / Self Care | Payer: Medicare HMO | Source: Ambulatory Visit | Attending: Physician Assistant | Admitting: Physician Assistant

## 2020-04-06 ENCOUNTER — Encounter: Payer: Self-pay | Admitting: Radiology

## 2020-04-06 ENCOUNTER — Ambulatory Visit
Admission: RE | Admit: 2020-04-06 | Discharge: 2020-04-06 | Disposition: A | Discharge status: Home / Self Care | Payer: Medicare HMO | Source: Ambulatory Visit | Attending: General Surgery | Admitting: General Surgery

## 2020-04-06 ENCOUNTER — Ambulatory Visit (INDEPENDENT_AMBULATORY_CARE_PROVIDER_SITE_OTHER): Payer: Medicare HMO | Admitting: Dietician

## 2020-04-06 ENCOUNTER — Other Ambulatory Visit: Payer: Self-pay | Admitting: Internal Medicine

## 2020-04-06 DIAGNOSIS — Z713 Dietary counseling and surveillance: Secondary | ICD-10-CM | POA: Diagnosis not present

## 2020-04-06 DIAGNOSIS — N521 Erectile dysfunction due to diseases classified elsewhere: Secondary | ICD-10-CM | POA: Diagnosis not present

## 2020-04-06 DIAGNOSIS — K810 Acute cholecystitis: Secondary | ICD-10-CM

## 2020-04-06 DIAGNOSIS — E1159 Type 2 diabetes mellitus with other circulatory complications: Secondary | ICD-10-CM

## 2020-04-06 HISTORY — PX: IR RADIOLOGIST EVAL & MGMT: IMG5224

## 2020-04-06 NOTE — Patient Instructions (Addendum)
Thank you for your visit today!  I will ask for an eye referral. Dr. [name] per your request.  You said you would call [employer] to find out  1- if you have dental benefits in 2021  If you do- ask them for the name of a Human dentist in network  if you do not- ask them how you can get dental coverage  The weight you are right now is good- try to maintain it.   [name] [phone]

## 2020-04-06 NOTE — Progress Notes (Signed)
  Diabetes Self-Management Education  Visit Type: Follow-up (yearly follow up)  Appt. Start Time: 1400 Appt. End Time: 9937  04/06/2020  [name], identified by name and date of birth, is a 59 y.o. male with a diagnosis of Diabetes:  .   ASSESSMENT Dentist- would like help scheduling. He is not sure if he has dental coverage- advised him to call [facility] customer service.  Eye doctor- He would like a referral to Dr. [name]  He asked about Continuous glucose monitoring. He was educated about it and its qualifications for it to be covered by Medicare. He  is not on insulin so may not qualify for it. He also did not understand that it was to be used to check blood sugar 6+ times a day and that he would have to wear it continuously and change is every 10-14 days.   Estimated body mass index is 30.08 kg/m as calculated from the following:   Height as of 03/30/20: 5\' 3"  (1.6 m).   Weight as of 03/30/20: 169 lb 12.8 oz (77 kg).  Wt Readings from Last 10 Encounters:  03/30/20 169 lb 12.8 oz (77 kg)  03/17/20 169 lb (76.7 kg)  03/16/20 170 lb 1.6 oz (77.2 kg)  03/09/20 169 lb 3.2 oz (76.7 kg)  03/03/20 177 lb 3.2 oz (80.4 kg)  07/11/19 193 lb (87.5 kg)  06/02/19 187 lb (84.8 kg)  05/22/19 187 lb 9.6 oz (85.1 kg)  04/16/19 189 lb 12.8 oz (86.1 kg)  03/03/19 185 lb 3.2 oz (84 kg)      Diabetes Self-Management Education - 04/06/20 1600      Visit Information   Visit Type Follow-up   yearly follow up          Individualized Plan for Diabetes Self-Management Training:   Learning Objective:  Patient will have a greater understanding of diabetes self-management. Patient education plan is to attend individual and/or group sessions per assessed needs and concerns.   Plan:   Patient Instructions  Thank you for your visit today!  I will ask for an eye referral. Dr. [name] per your request.  You said you would call [employer] to find out  1- if you have dental benefits in  2021  If you do- ask them for the name of a Human dentist in network  if you do not- ask them how you can get dental coverage  The weight you are right now is good- try to maintain it.   [name] [phone]    Expected Outcomes:  Demonstrated interest in learning. Expect positive outcomes  Education material provided: Diabetes Resources  If problems or questions, patient to contact team via:  Phone  Future DSME appointment: 3-4 months  Debera Lat, RD 04/06/2020 4:35 PM.

## 2020-04-06 NOTE — Progress Notes (Signed)
Chief Complaint: Patient was seen in consultation today for perc chole drain   History of Present Illness: John Dickerson is a 59 y.o. male here for follow up of perc chole drain placed on 9/18 for acute acalculous cholecystitis. He had cholangiogram on 10/5 prior to discharge that showed patent cystic and CBD. He's been doing well. No fevers, chills, abd pain, N/V Not much output from drain. Reports he has appointment later this week to see surgeon.  Past Medical History:  Diagnosis Date  . Anemia   . Anemia due to medication 09/24/2015  . Arthritis    "feet" (11/19/2015)  . CAD (coronary artery disease), native coronary artery    a. remote LAD stent 1990s. b. s/p DESx2 to LAD 11/2015  . Hepatitis A immune 11/20/2016  . Hepatitis B immune 11/20/2016  . Hepatitis C    "going thru the treatments" (11/19/2015)  . High cholesterol   . History of gout    "just recently" (11/19/2015)  . HIV (human immunodeficiency virus infection) ([place])   . Hypertension   . Posterior pain of left hip 03/01/2018  . Type II diabetes mellitus ([place])    diagnosed 04-2015    Past Surgical History:  Procedure Laterality Date  . CARDIAC CATHETERIZATION N/A 11/19/2015   Procedure: Left Heart Cath and Coronary Angiography;  Surgeon: Jolaine Artist, MD;  Location: [facility];  Service: Cardiovascular;  Laterality: N/A;  . CARDIAC CATHETERIZATION N/A 11/19/2015   Procedure: Coronary Stent Intervention;  Surgeon: Jettie Booze, MD;  Location: [facility];  Service: Cardiovascular;  Laterality: N/A;  . CORONARY ANGIOPLASTY WITH STENT PLACEMENT  11/19/2015   "2 stents"  . IR CHOLANGIOGRAM EXISTING TUBE  03/23/2020  . IR PERC CHOLECYSTOSTOMY  03/06/2020  . IR RADIOLOGIST EVAL & MGMT  04/06/2020  . other     unknown abdominal surgery in childhood    Allergies: Patient has no known allergies.  Medications: Prior to Admission medications   Medication Sig Start Date End Date Taking?  Authorizing Provider  acetaminophen (TYLENOL) 500 MG tablet Take 2 tablets (1,000 mg total) by mouth every 6 (six) hours as needed for moderate pain. 08/29/19   Darr, Marguerita Beards, PA-C  allopurinol (ZYLOPRIM) 100 MG tablet TAKE 1 AND 1/2 TABLETS(150 MG) BY MOUTH DAILY Patient taking differently: Take 150 mg by mouth daily.  07/28/19   Jean Rosenthal, MD  amLODipine (NORVASC) 10 MG tablet Take 1 tablet (10 mg total) by mouth daily. 11/03/19   Axel Filler, MD  aspirin EC 81 MG tablet Take 1 tablet (81 mg total) by mouth daily. 11/17/15   Bensimhon, Shaune Pascal, MD  atorvastatin (LIPITOR) 40 MG tablet Take 1 tablet (40 mg total) by mouth daily at 6 PM. 03/21/19   Jettie Booze, MD  BIKTARVY 50-200-25 MG TABS tablet TAKE 1 TABLET BY MOUTH DAILY 12/01/19   Campbell Riches, MD  blood glucose meter kit and supplies KIT Dispense based on patient and insurance preference. Use up to four times daily as directed. (FOR ICD-9 250.00, 250.01). Patient not taking: Reported on 07/11/2019 08/03/15   Bethena Roys, MD  Blood Glucose Monitoring Suppl ([place]) w/Device KIT 1 each by Does not apply route daily. Patient not taking: Reported on 07/11/2019 12/22/15   Collier Salina, MD  Continuous Blood Gluc Receiver (FREESTYLE LIBRE 2 READER SYSTM) DEVI 1 Device by Does not apply route 3 (three) times daily. Patient not taking:  Reported on 07/11/2019 01/08/19   Jean Rosenthal, MD  Continuous Blood Gluc Sensor (FREESTYLE LIBRE 2 SENSOR SYSTM) MISC 1 Device by Does not apply route 3 (three) times daily. Patient not taking: Reported on 07/11/2019 01/08/19   Jean Rosenthal, MD  glucose blood (ONETOUCH VERIO) test strip Check blood sugar up to 1 time a day Patient not taking: Reported on 07/11/2019 12/22/15   Collier Salina, MD  lidocaine (LIDODERM) 5 % Place 1 patch onto the skin daily as needed (flank pain). Remove & Discard patch within 12 hours or as directed by MD    [provider]   losartan (COZAAR) 50 MG tablet Take 1 tablet (50 mg total) by mouth daily. 03/30/20   Mosetta Anis, MD  metFORMIN (GLUCOPHAGE) 1000 MG tablet TAKE 1 TABLET BY MOUTH TWICE DAILY WITH A MEAL 12/01/19   Agyei, Caprice Kluver, MD  metoprolol succinate (TOPROL-XL) 100 MG 24 hr tablet TAKE 1 TABLET BY MOUTH DAILY WITH OR IMMEDAITELY FOLLOWING A MEAL 10/21/19   Agyei, Caprice Kluver, MD  Multiple Vitamin (MULTIVITAMIN WITH MINERALS) TABS tablet Take 1 tablet by mouth daily.    [provider]  nitroGLYCERIN (NITROSTAT) 0.4 MG SL tablet Place 1 tablet (0.4 mg total) under the tongue every 5 (five) minutes as needed for chest pain. 11/17/15   Bensimhon, Shaune Pascal, MD  [facility] DELICA LANCETS FINE MISC Check blood sugar up to 1 time a day Patient not taking: Reported on 07/11/2019 12/22/15   Collier Salina, MD  sodium chloride 0.9 % injection Instill 5 mL once daily into drain. 03/08/20 04/07/20  Joaquim Nam, PA-C     Family History  Problem Relation Age of Onset  . Diabetes Mother   . Cancer Mother        unkown type  . Hypertension Father   . Colon cancer Neg Hx     Social History   Socioeconomic History  . Marital status: Legally Separated    Spouse name: Not on file  . Number of children: Not on file  . Years of education: Not on file  . Highest education level: Not on file  Occupational History  . Not on file  Tobacco Use  . Smoking status: Former Smoker    Packs/day: 0.12    Years: 49.00    Pack years: 5.88    Types: Cigarettes    Start date: 07/20/2012    Quit date: 02/18/2015    Years since quitting: 5.1  . Smokeless tobacco: Never Used  Vaping Use  . Vaping Use: Never used  Substance and Sexual Activity  . Alcohol use: Yes    Alcohol/week: 6.0 standard drinks    Types: 6 Standard drinks or equivalent per week  . Drug use: No  . Sexual activity: Yes    Partners: Female  Other Topics Concern  . Not on file  Social History Narrative  . Not on file   Social Determinants  of Health   Financial Resource Strain:   . Difficulty of Paying Living Expenses: Not on file  Food Insecurity:   . Worried About Charity fundraiser in the Last Year: Not on file  . Ran Out of Food in the Last Year: Not on file  Transportation Needs:   . Lack of Transportation (Medical): Not on file  . Lack of Transportation (Non-Medical): Not on file  Physical Activity:   . Days of Exercise per Week: Not on file  . Minutes of Exercise  per Session: Not on file  Stress:   . Feeling of Stress : Not on file  Social Connections:   . Frequency of Communication with Friends and Family: Not on file  . Frequency of Social Gatherings with Friends and Family: Not on file  . Attends Religious Services: Not on file  . Active Member of Clubs or Organizations: Not on file  . Attends Archivist Meetings: Not on file  . Marital Status: Not on file    Review of Systems: A 12 point ROS discussed and pertinent positives are indicated in the HPI above.  All other systems are negative.  Review of Systems  Vital Signs: BP 124/71   Pulse 75   Temp 98.4 F (36.9 C)   SpO2 100%   Physical Exam Cardiovascular:     Rate and Rhythm: Normal rate and regular rhythm.     Heart sounds: Normal heart sounds.  Pulmonary:     Effort: Pulmonary effort is normal.     Breath sounds: Normal breath sounds.  Abdominal:     General: Abdomen is flat.     Palpations: Abdomen is soft.     Comments: RUQ drain intact site clean, NT  Neurological:     Mental Status: He is alert.     Imaging: Cholangiogram today shows gb filling and patent cystic and common ducts with ultimate contrast filling into the duodenum.  Labs:  CBC: Recent Labs    03/08/20 0417 03/09/20 0349 03/17/20 1355 03/22/20 0929  WBC 12.6* 10.5 7.8 8.9  HGB 12.3* 12.2* 12.2* 12.6*  HCT 36.5* 36.2* 37.4* 37.0*  PLT 480* 525* 600* 607*    COAGS: Recent Labs    03/06/20 1307 03/07/20 1222  INR 1.0 1.2    BMP: Recent  Labs    03/09/20 0349 03/16/20 0949 03/17/20 1355 03/22/20 0929 03/25/20 0928 03/30/20 0938  NA   < > 136 136 137 138 138  K   < > 5.8* 4.3 5.7* 5.6* 4.8  CL   < > 97 101 99 100 97  CO2   < > _0 GLUCOSE   < > 107* 91 124* 109* 126*  BUN   < > _1 26* 20  CALCIUM   < > 10.8* 10.4* 10.9* 11.2* 10.2  10.4*  CREATININE   < > 1.43* 1.21 1.31* 1.24 1.10  GFRNONAA  --  52* >60  --  62 72  GFRAA  --  61 >60  --  72 83   < > = values in this interval not displayed.    LIVER FUNCTION TESTS: Recent Labs    03/07/20 1222 03/07/20 1222 03/09/20 0349 03/09/20 0349 03/17/20 1355 03/22/20 0929 03/25/20 0928 03/30/20 0938  BILITOT 1.3*   < > 0.6   < > 0.5 0.4 0.4 0.2  AST 100*   < > 47*   < > 48* 34 22 18  ALT 109*   < > 73*   < > 47* 141* 63* 29  ALKPHOS 435*  --  494*  --  197*  --   --  250*  PROT 7.1   < > 7.9   < > 7.7 7.6 7.1 7.4  ALBUMIN 2.5*  --  2.5*  --  3.0*  --   --  4.2   < > = values in this interval not displayed.    TUMOR MARKERS: No results for input(s): AFPTM, CEA, CA199, CHROMGRNA in the last  8760 hours.  Assessment and Plan: Acalculous cholecystitis S/p perc chole drain 10/18 Ducts patent. Will cap drain and give pt extra bag to reconnect prn sxs. He is otherwise to see surgeon later this week for decision on cholecystectomy. If no plans for surgery, drain could removed in another 2 weeks.   Thank you for this interesting consult.  I greatly enjoyed meeting JENO CALLEROS and look forward to participating in their care.  A copy of this report was sent to the requesting provider on this date.  Electronically Signed: Ascencion Dike 04/06/2020, 1:22 PM   I spent a total of 20 minutes in face to face in clinical consultation, greater than 50% of which was counseling/coordinating care for perc chole drain

## 2020-04-09 ENCOUNTER — Ambulatory Visit (INDEPENDENT_AMBULATORY_CARE_PROVIDER_SITE_OTHER): Payer: Medicare HMO | Admitting: Infectious Diseases

## 2020-04-09 ENCOUNTER — Encounter: Payer: Self-pay | Admitting: Infectious Diseases

## 2020-04-09 ENCOUNTER — Other Ambulatory Visit: Payer: Self-pay

## 2020-04-09 VITALS — BP 124/71 | HR 74 | Wt 168.0 lb

## 2020-04-09 DIAGNOSIS — N183 Chronic kidney disease, stage 3 unspecified: Secondary | ICD-10-CM

## 2020-04-09 DIAGNOSIS — E1159 Type 2 diabetes mellitus with other circulatory complications: Secondary | ICD-10-CM

## 2020-04-09 DIAGNOSIS — B20 Human immunodeficiency virus [HIV] disease: Secondary | ICD-10-CM

## 2020-04-09 DIAGNOSIS — N521 Erectile dysfunction due to diseases classified elsewhere: Secondary | ICD-10-CM

## 2020-04-09 DIAGNOSIS — Z113 Encounter for screening for infections with a predominantly sexual mode of transmission: Secondary | ICD-10-CM

## 2020-04-09 DIAGNOSIS — B182 Chronic viral hepatitis C: Secondary | ICD-10-CM

## 2020-04-09 DIAGNOSIS — Z79899 Other long term (current) drug therapy: Secondary | ICD-10-CM

## 2020-04-09 NOTE — Progress Notes (Signed)
   Subjective:    Patient ID: John Dickerson, male    DOB: 1960/09/19, 59 y.o.   MRN: [id number]  HPI  59yo M with hx of HIV+ was Dx 1998, DM2,CAD (remote LAD stent 1990s, s/p DESx2 to LAD 11/2015),and HTN. PreviousHep C 1a. 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  He was on atripla til changed to [place] for Hep C rx.  He was started on zepatier/sovaldi/rib (plan 16 weeks) on 2-25-18after he took harvoni for short course. His ART was changed to [place]/Descoveyat that time. He was changed to biktarvy 11-2016. Repeat Hep C RNAnegative(7-17 and 3-18).  He has had f/u at [facility] for his DM.A1C in hospital 6.4%.   He was adm 02-2020 with acute, acalculous cholecystitis. He did not have surgery and has been feeling better but drained.  No problems with biktarvy.  Interested in monthly shot.  Interested in [place] booster.   HIV 1 RNA Quant  Date Value  03/22/2020 <20 Copies/mL  05/09/2019 <20 DETECTED copies/mL (A)  08/12/2018 <20 NOT DETECTED copies/mL   CD4 T Cell Abs (/uL)  Date Value  03/22/2020 716  05/09/2019 634  08/12/2018 600    Review of Systems  Constitutional: Negative for appetite change, chills, fever and unexpected weight change.  Respiratory: Negative for cough and shortness of breath.   Gastrointestinal: Positive for abdominal pain. Negative for constipation and diarrhea.  Genitourinary: Negative for difficulty urinating.  Neurological: Negative for numbness.  All other systems reviewed and are negative.      Objective:   Physical Exam Vitals reviewed.  Constitutional:      General: He is not in acute distress.    Appearance: Normal appearance. He is not ill-appearing.  HENT:     Mouth/Throat:     Mouth: Mucous membranes are moist.     Pharynx: No oropharyngeal exudate.  Eyes:     Extraocular Movements: Extraocular movements intact.     Pupils: Pupils are equal, round, and reactive to light.  Cardiovascular:     Rate and Rhythm: Normal rate and regular  rhythm.  Pulmonary:     Effort: Pulmonary effort is normal.     Breath sounds: Normal breath sounds.  Abdominal:     General: Bowel sounds are normal. There is no distension.     Palpations: Abdomen is soft.     Tenderness: There is no abdominal tenderness.  Musculoskeletal:     Cervical back: Normal range of motion and neck supple.     Right lower leg: No edema.     Left lower leg: No edema.  Neurological:     Mental Status: He is alert.  Psychiatric:        Mood and Affect: Mood normal.           Assessment & Plan:

## 2020-04-09 NOTE — Assessment & Plan Note (Signed)
Ultrasound 02-2020: Normal echogenicity without focal lesion or biliary dilatation. Portal vein is patent on color Doppler imaging with normal direction of blood flow towards the liver.  Will repeat imaging prn.

## 2020-04-09 NOTE — Assessment & Plan Note (Signed)
His GFR has improved Will continue to monitor.

## 2020-04-09 NOTE — Assessment & Plan Note (Signed)
Has gotten flu shot Will schedule for booster Will give info on [place] study Offered/refused condoms.  Will see him back in 9 months.

## 2020-04-09 NOTE — Assessment & Plan Note (Addendum)
His A1C was < 7.  He is managed by [facility] and I greatly appreciate their care.  Will re-refer for ophtho.

## 2020-04-12 ENCOUNTER — Telehealth: Payer: Self-pay | Admitting: *Deleted

## 2020-04-12 NOTE — Telephone Encounter (Signed)
Vickie with IR called back. States patient was seen on 04/06/2020 and was instructed to uncap tube and replace bag if he experienced drainage. She will contact patient now to discuss. Hubbard Hartshorn, BSN, RN-BC

## 2020-04-12 NOTE — Telephone Encounter (Signed)
Patient called in c/o 4 day hx of green drainage around cholecystotomy tube that was placed during admission on 03/05/2020. Also, with pain at site; rates 5/10 at present, and chills. Area is not red nor malodorous. Discused with Attending and patient to call IR. Left detailed VMB message for Vickie at IR [phone] with patient info and requesting return call to clinic. Patient also given contact info for IR. Hubbard Hartshorn, BSN, RN-BC

## 2020-04-12 NOTE — Telephone Encounter (Signed)
Thank you for following up with this.

## 2020-04-15 ENCOUNTER — Telehealth: Payer: Self-pay

## 2020-04-15 NOTE — Telephone Encounter (Signed)
He needs to keep the drain in until he follows up with IR.

## 2020-04-15 NOTE — Telephone Encounter (Signed)
Requesting to speak with a nurse about white/tan stool. Please call pt back.

## 2020-04-15 NOTE — Telephone Encounter (Signed)
RTC, patient states his stool has been white, clay-colored X 1 week now and he is wanting to know if this is normal.  Per chart review, patient has cholecystitis and currently has cholecystotomy tube.  Informed patient that when there is a lack of bile, stools may appear white in color.  Patient denies any pain currently.  Patient does have a f/u appt scheduled tomorrow at 0900 with ID for c/o drainage around tube.     Patient wanting to know how long he will need to have cholecystotomy drain. Will route to yellow team to advise. SChaplin, RN,BSN

## 2020-04-16 ENCOUNTER — Other Ambulatory Visit: Payer: Self-pay

## 2020-04-16 ENCOUNTER — Ambulatory Visit (INDEPENDENT_AMBULATORY_CARE_PROVIDER_SITE_OTHER): Payer: Medicare HMO

## 2020-04-16 DIAGNOSIS — Z23 Encounter for immunization: Secondary | ICD-10-CM

## 2020-04-16 NOTE — Telephone Encounter (Signed)
Attempted to call patient, patient did not pick up. SChaplin, RN,BSN

## 2020-04-16 NOTE — Progress Notes (Signed)
° °  Covid-19 Vaccination Clinic  Name:  John Dickerson    MRN: [id number] DOB: [birth year]  04/16/2020  [name] was observed post Covid-19 immunization for 15 minutes without incident. He was provided with Vaccine Information Sheet and instruction to access the V-Safe system.   [name] was instructed to call 911 with any severe reactions post vaccine:  Difficulty breathing   Swelling of face and throat   A fast heartbeat   A bad rash all over body   Dizziness and weakness    Theresia Majors CMA

## 2020-04-17 NOTE — Progress Notes (Signed)
Cardiology Office Note   Date:  04/19/2020   ID:  [name], DOB 06/16/1961, MRN [id number]  PCP:  Jean Rosenthal, MD    No chief complaint on file.  CAD  Wt Readings from Last 3 Encounters:  04/19/20 163 lb 6.4 oz (74.1 kg)  04/09/20 168 lb (76.2 kg)  03/30/20 169 lb 12.8 oz (77 kg)       History of Present Illness: John Dickerson is a 59 y.o. male  with history of CAD (remote LAD stent 1990s, s/p DESx2 to LAD 11/2015) HIV, DM, HTN, HLD, gout, anemia. Angina occurred with mowing the lawn.  Outpatient cath was done in June 2017demonstrating 80% mid LAD (in area previously treated with a stent) and 80% distal LAD, which were both treated with DES. He had residual nonobstructive disease otherwise - 50% ramus, 80% OM1, 70% ost LPDA, 50% mid-distal LAD. LVEF was normal. He was started on Plavix. Dr. Haroldine Laws discussed statin therapy with ID who cleared the patient for atorvastatin 27m daily. LFTs were normal 12/29/15. LDL in 10/2015 was 99. Of note, Hgb in 2016 was 15-16. In 2017 it has run in the 9-11 range.  He has had some high BP readings up to the 150s, when he eats some extra salt.   Since the last visit, he has had cholecystitis.  Treated with a drain but cholecystectomy is not needed.  He did well with COVID booster.    Weight loss.  No appetite since gallbladders.  Mild bleeding at the drain site but nothing prolonged.  Denies : Chest pain. Dizziness. Leg edema. Nitroglycerin use. Orthopnea. Palpitations. Paroxysmal nocturnal dyspnea. Shortness of breath. Syncope.    Past Medical History:  Diagnosis Date  . Anemia   . Anemia due to medication 09/24/2015  . Arthritis    "feet" (11/19/2015)  . CAD (coronary artery disease), native coronary artery    a. remote LAD stent 1990s. b. s/p DESx2 to LAD 11/2015  . Hepatitis A immune 11/20/2016  . Hepatitis B immune 11/20/2016  . Hepatitis C    "going thru the treatments" (11/19/2015)  . High cholesterol   .  History of gout    "just recently" (11/19/2015)  . HIV ([place] immunodeficiency virus infection) ([place]   . Hypertension   . Posterior pain of left hip 03/01/2018  . Type II diabetes mellitus ([place]    diagnosed 04-2015    Past Surgical History:  Procedure Laterality Date  . CARDIAC CATHETERIZATION N/A 11/19/2015   Procedure: Left Heart Cath and Coronary Angiography;  Surgeon: DJolaine Artist MD;  [facility];  Service: Cardiovascular;  Laterality: N/A;  . CARDIAC CATHETERIZATION N/A 11/19/2015   Procedure: Coronary Stent Intervention;  Surgeon: JJettie Booze MD;  [facility];  Service: Cardiovascular;  Laterality: N/A;  . CORONARY ANGIOPLASTY WITH STENT PLACEMENT  11/19/2015   "2 stents"  . IR CHOLANGIOGRAM EXISTING TUBE  03/23/2020  . IR PERC CHOLECYSTOSTOMY  03/06/2020  . IR RADIOLOGIST EVAL & MGMT  04/06/2020  . other     unknown abdominal surgery in childhood     Current Outpatient Medications  Medication Sig Dispense Refill  . acetaminophen (TYLENOL) 500 MG tablet Take 2 tablets (1,000 mg total) by mouth every 6 (six) hours as needed for moderate pain. 30 tablet 0  . allopurinol (ZYLOPRIM) 100 MG tablet TAKE 1 AND 1/2 TABLETS(150 MG) BY MOUTH DAILY (Patient taking differently: Take 150 mg by mouth daily. )  135 tablet 1  . amLODipine (NORVASC) 10 MG tablet Take 1 tablet (10 mg total) by mouth daily. 90 tablet 3  . aspirin EC 81 MG tablet Take 1 tablet (81 mg total) by mouth daily. 90 tablet 3  . atorvastatin (LIPITOR) 40 MG tablet Take 1 tablet (40 mg total) by mouth daily at 6 PM. Please make overdue appt with Dr. Irish Lack before anymore refills. 1st attempt 30 tablet 0  . BIKTARVY 50-200-25 MG TABS tablet TAKE 1 TABLET BY MOUTH DAILY 90 tablet 1  . blood glucose meter kit and supplies KIT Dispense based on patient and insurance preference. Use up to four times daily as directed. (FOR ICD-9 250.00, 250.01). 1 each 0  . Blood Glucose Monitoring  Suppl ([place]) w/Device KIT 1 each by Does not apply route daily. 1 kit 0  . Continuous Blood Gluc Receiver (FREESTYLE LIBRE 2 READER SYSTM) DEVI 1 Device by Does not apply route 3 (three) times daily. 1 Device 0  . Continuous Blood Gluc Sensor (FREESTYLE LIBRE 2 SENSOR SYSTM) MISC 1 Device by Does not apply route 3 (three) times daily. 1 each 0  . glucose blood (ONETOUCH VERIO) test strip Check blood sugar up to 1 time a day 100 each 12  . lidocaine (LIDODERM) 5 % Place 1 patch onto the skin daily as needed (flank pain). Remove & Discard patch within 12 hours or as directed by MD     . losartan (COZAAR) 50 MG tablet Take 1 tablet (50 mg total) by mouth daily. 90 tablet 3  . metFORMIN (GLUCOPHAGE) 1000 MG tablet TAKE 1 TABLET BY MOUTH TWICE DAILY WITH A MEAL 180 tablet 1  . metoprolol succinate (TOPROL-XL) 100 MG 24 hr tablet TAKE 1 TABLET BY MOUTH DAILY WITH OR IMMEDAITELY FOLLOWING A MEAL 90 tablet 6  . Multiple Vitamin (MULTIVITAMIN WITH MINERALS) TABS tablet Take 1 tablet by mouth daily.    . nitroGLYCERIN (NITROSTAT) 0.4 MG SL tablet Place 1 tablet (0.4 mg total) under the tongue every 5 (five) minutes as needed for chest pain. 25 tablet 3  . ONETOUCH DELICA LANCETS FINE MISC Check blood sugar up to 1 time a day 100 each 12   No current facility-administered medications for this visit.    Allergies:   Patient has no known allergies.    Social History:  The patient  reports that he quit smoking about 5 years ago. His smoking use included cigarettes. He started smoking about 7 years ago. He has a 5.88 pack-year smoking history. He has never used smokeless tobacco. He reports current alcohol use of about 6.0 standard drinks of alcohol per week. He reports that he does not use drugs.   Family History:  The patient's family history includes Cancer in his mother; Diabetes in his mother; Hypertension in his father.    ROS:  Please see the history of present illness.    Otherwise, review of systems are positive for side pain where gall bladder drain is present.   All other systems are reviewed and negative.    PHYSICAL EXAM: VS:  BP 122/60   Pulse 72   Ht '5\' 3"'  (1.6 m)   Wt 163 lb 6.4 oz (74.1 kg)   SpO2 99%   BMI 28.95 kg/m  , BMI Body mass index is 28.95 kg/m. GEN: Well nourished, well developed, in no acute distress  HEENT: normal  Neck: no JVD, carotid bruits, or masses Cardiac: RRR; no murmurs, rubs, or gallops,no edema  Respiratory:  clear to auscultation bilaterally, normal work of breathing GI: soft, nontender, nondistended, + BS MS: no deformity or atrophy  Skin: warm and dry, no rash Neuro:  Strength and sensation are intact Psych: euthymic mood, full affect   EKG:   The ekg ordered today demonstrates NSR, nonspecific ST changes   Recent Labs: 03/22/2020: Hemoglobin 12.6; Platelets 607 03/30/2020: ALT 29; BUN 20; Creatinine, Ser 1.10; Potassium 4.8; Sodium 138   Lipid Panel    Component Value Date/Time   CHOL 112 03/22/2020 0929   CHOL 164 03/03/2019 0903   TRIG 135 03/22/2020 0929   HDL 37 (L) 03/22/2020 0929   HDL 45 03/03/2019 0903   CHOLHDL 3.0 03/22/2020 0929   VLDL 9 03/22/2016 1155   LDLCALC 53 03/22/2020 0929     Other studies Reviewed: Additional studies/ records that were reviewed today with results demonstrating: LDL 53 in 10/21.   ASSESSMENT AND PLAN:  1. CAD: No angina. Continue aggressive secondary prevention.   2. HTN: The current medical regimen is effective;  continue present plan and medications. 3. Hyperlipidemia: Whole food, plant based diet recommended.  COntinue atorvastatin.  Well controlled.  4. HIV: Followed by ID.  On medical therapy.  5. Cholecystitis: Will refer back to Dr. Pascal Lux, [facility].   Current medicines are reviewed at length with the patient today.  The patient concerns regarding his medicines were addressed.  The following changes have been made:  No change  Labs/ tests  ordered today include:  No orders of the defined types were placed in this encounter.   Recommend 150 minutes/week of aerobic exercise Low fat, low carb, high fiber diet recommended  Disposition:   FU in 1 year   Signed, Larae Grooms, MD  04/19/2020 9:48 AM    [facility] [address], [place], [place]  [address] Phone: [phone]; Fax: [phone]

## 2020-04-19 ENCOUNTER — Encounter: Payer: Self-pay | Admitting: Interventional Cardiology

## 2020-04-19 ENCOUNTER — Other Ambulatory Visit: Payer: Self-pay | Admitting: General Surgery

## 2020-04-19 ENCOUNTER — Other Ambulatory Visit: Payer: Self-pay

## 2020-04-19 ENCOUNTER — Ambulatory Visit (INDEPENDENT_AMBULATORY_CARE_PROVIDER_SITE_OTHER): Payer: Medicare HMO | Admitting: Interventional Cardiology

## 2020-04-19 VITALS — BP 122/60 | HR 72 | Ht 63.0 in | Wt 163.4 lb

## 2020-04-19 DIAGNOSIS — K810 Acute cholecystitis: Secondary | ICD-10-CM

## 2020-04-19 DIAGNOSIS — I2510 Atherosclerotic heart disease of native coronary artery without angina pectoris: Secondary | ICD-10-CM | POA: Diagnosis not present

## 2020-04-19 DIAGNOSIS — E782 Mixed hyperlipidemia: Secondary | ICD-10-CM

## 2020-04-19 DIAGNOSIS — I10 Essential (primary) hypertension: Secondary | ICD-10-CM

## 2020-04-19 DIAGNOSIS — Z95820 Peripheral vascular angioplasty status with implants and grafts: Secondary | ICD-10-CM

## 2020-04-19 NOTE — Patient Instructions (Signed)
Medication Instructions:  Your physician recommends that you continue on your current medications as directed. Please refer to the Current Medication list given to you today.  *If you need a refill on your cardiac medications before your next appointment, please call your pharmacy*   Lab Work: None  If you have labs (blood work) drawn today and your tests are completely normal, you will receive your results only by: [name] MyChart Message (if you have MyChart) OR . A paper copy in the mail If you have any lab test that is abnormal or we need to change your treatment, we will call you to review the results.   Testing/Procedures: None   Follow-Up: At [facility], you and your health needs are our priority.  As part of our continuing mission to provide you with exceptional heart care, we have created designated Provider Care Teams.  These Care Teams include your primary Cardiologist (physician) and Advanced Practice Providers (APPs -  Physician Assistants and Nurse Practitioners) who all work together to provide you with the care you need, when you need it.  We recommend signing up for the patient portal called "MyChart".  Sign up information is provided on this After Visit Summary.  MyChart is used to connect with patients for Virtual Visits (Telemedicine).  Patients are able to view lab/test results, encounter notes, upcoming appointments, etc.  Non-urgent messages can be sent to your provider as well.   To learn more about what you can do with MyChart, go to [email or web address].    Your next appointment:   12 month(s)  The format for your next appointment:   In Person  Provider:   You may see Larae Grooms, MD or one of the following Advanced Practice Providers on your designated Care Team:    Melina Copa, PA-C  Ermalinda Barrios, PA-C    Other Instructions Call Interventional Radiology to have your drain removed.   Sandi Mariscal, MD Follow up in 4 week(s).   Specialties:  Interventional Radiology, Radiology Why: IR scheduler will call you with appointment date/time. Please call with any questions or concerns prior to your appointment. Contact information: [address] [place] [address] [phone]

## 2020-04-20 ENCOUNTER — Other Ambulatory Visit (HOSPITAL_COMMUNITY): Payer: Self-pay | Admitting: Interventional Radiology

## 2020-04-20 ENCOUNTER — Ambulatory Visit
Admission: RE | Admit: 2020-04-20 | Discharge: 2020-04-20 | Disposition: A | Discharge status: Home / Self Care | Payer: Medicare HMO | Source: Ambulatory Visit | Attending: General Surgery | Admitting: General Surgery

## 2020-04-20 ENCOUNTER — Encounter: Payer: Self-pay | Admitting: Radiology

## 2020-04-20 DIAGNOSIS — K810 Acute cholecystitis: Secondary | ICD-10-CM

## 2020-04-20 DIAGNOSIS — K819 Cholecystitis, unspecified: Secondary | ICD-10-CM

## 2020-04-20 HISTORY — PX: IR RADIOLOGIST EVAL & MGMT: IMG5224

## 2020-04-20 NOTE — Progress Notes (Signed)
Referring Physician(s): Wilson,Eric  Chief Complaint: The patient is seen in follow up today s/p percutaneous cholecystostomy on 03/06/2020  History of present illness: [name] is a 59 year old male with history of a acalculus cholecystitis and percutaneous cholecystostomy performed on 03/06/2020.  He underwent follow-up cholangiogram on 04/06/2020 which revealed patent biliary system.  His drain was subsequently capped.  2 days later the patient began experiencing some leakage from drain insertion site and drain bag was reattached.  He has since had about 20 to 30 cc of slightly blood-tinged bile output daily.  He is flushing the drain once daily with 5 cc sterile saline.  He currently denies fever, headache, chest pain, dyspnea, cough, back pain, nausea, vomiting or bleeding.  He does have some tenderness at gallbladder drain insertion site.  He presents again today for follow-up drain evaluation/cholangiogram.   Past Medical History:  Diagnosis Date  . Anemia   . Anemia due to medication 09/24/2015  . Arthritis    "feet" (11/19/2015)  . CAD (coronary artery disease), native coronary artery    a. remote LAD stent 1990s. b. s/p DESx2 to LAD 11/2015  . Hepatitis A immune 11/20/2016  . Hepatitis B immune 11/20/2016  . Hepatitis C    "going thru the treatments" (11/19/2015)  . High cholesterol   . History of gout    "just recently" (11/19/2015)  . HIV (human immunodeficiency virus infection) ([place])   . Hypertension   . Posterior pain of left hip 03/01/2018  . Type II diabetes mellitus ([place])    diagnosed 04-2015    Past Surgical History:  Procedure Laterality Date  . CARDIAC CATHETERIZATION N/A 11/19/2015   Procedure: Left Heart Cath and Coronary Angiography;  Surgeon: Jolaine Artist, MD;  Location: [place] [facility];  Service: Cardiovascular;  Laterality: N/A;  . CARDIAC CATHETERIZATION N/A 11/19/2015   Procedure: Coronary Stent Intervention;  Surgeon: Jettie Booze, MD;   Location: [facility];  Service: Cardiovascular;  Laterality: N/A;  . CORONARY ANGIOPLASTY WITH STENT PLACEMENT  11/19/2015   "2 stents"  . IR CHOLANGIOGRAM EXISTING TUBE  03/23/2020  . IR PERC CHOLECYSTOSTOMY  03/06/2020  . IR RADIOLOGIST EVAL & MGMT  04/06/2020  . IR RADIOLOGIST EVAL & MGMT  04/20/2020  . other     unknown abdominal surgery in childhood    Allergies: Patient has no known allergies.  Medications: Prior to Admission medications   Medication Sig Start Date End Date Taking? Authorizing Provider  acetaminophen (TYLENOL) 500 MG tablet Take 2 tablets (1,000 mg total) by mouth every 6 (six) hours as needed for moderate pain. 08/29/19   Darr, Edison Nasuti, PA-C  allopurinol (ZYLOPRIM) 100 MG tablet TAKE 1 AND 1/2 TABLETS(150 MG) BY MOUTH DAILY Patient taking differently: Take 150 mg by mouth daily.  07/28/19   Jean Rosenthal, MD  amLODipine (NORVASC) 10 MG tablet Take 1 tablet (10 mg total) by mouth daily. 11/03/19   Axel Filler, MD  aspirin EC 81 MG tablet Take 1 tablet (81 mg total) by mouth daily. 11/17/15   Bensimhon, Shaune Pascal, MD  atorvastatin (LIPITOR) 40 MG tablet Take 1 tablet (40 mg total) by mouth daily at 6 PM. Please make overdue appt with Dr. Irish Lack before anymore refills. 1st attempt 04/06/20   Jettie Booze, MD  BIKTARVY 50-200-25 MG TABS tablet TAKE 1 TABLET BY MOUTH DAILY 12/01/19   Campbell Riches, MD  blood glucose meter kit and supplies KIT Dispense based on patient and  insurance preference. Use up to four times daily as directed. (FOR ICD-9 250.00, 250.01). 08/03/15   Emokpae, Ejiroghene E, MD  Blood Glucose Monitoring Suppl (ONETOUCH VERIO FLEX SYSTEM) w/Device KIT 1 each by Does not apply route daily. 12/22/15   Rice, Resa Miner, MD  Continuous Blood Gluc Receiver (FREESTYLE LIBRE 2 READER SYSTM) DEVI 1 Device by Does not apply route 3 (three) times daily. 01/08/19   Jean Rosenthal, MD  Continuous Blood Gluc Sensor (FREESTYLE LIBRE 2 SENSOR SYSTM)  MISC 1 Device by Does not apply route 3 (three) times daily. 01/08/19   Jean Rosenthal, MD  glucose blood (ONETOUCH VERIO) test strip Check blood sugar up to 1 time a day 12/22/15   Rice, Resa Miner, MD  lidocaine (LIDODERM) 5 % Place 1 patch onto the skin daily as needed (flank pain). Remove & Discard patch within 12 hours or as directed by MD     [provider]  losartan (COZAAR) 50 MG tablet Take 1 tablet (50 mg total) by mouth daily. 03/30/20   Mosetta Anis, MD  metFORMIN (GLUCOPHAGE) 1000 MG tablet TAKE 1 TABLET BY MOUTH TWICE DAILY WITH A MEAL 12/01/19   Agyei, Caprice Kluver, MD  metoprolol succinate (TOPROL-XL) 100 MG 24 hr tablet TAKE 1 TABLET BY MOUTH DAILY WITH OR IMMEDAITELY FOLLOWING A MEAL 10/21/19   Agyei, Caprice Kluver, MD  Multiple Vitamin (MULTIVITAMIN WITH MINERALS) TABS tablet Take 1 tablet by mouth daily.    [provider]  nitroGLYCERIN (NITROSTAT) 0.4 MG SL tablet Place 1 tablet (0.4 mg total) under the tongue every 5 (five) minutes as needed for chest pain. 11/17/15   Bensimhon, Shaune Pascal, MD  [facility] DELICA LANCETS FINE MISC Check blood sugar up to 1 time a day 12/22/15   Collier Salina, MD     Family History  Problem Relation Age of Onset  . Diabetes Mother   . Cancer Mother        unkown type  . Hypertension Father   . Colon cancer Neg Hx     Social History   Socioeconomic History  . Marital status: Legally Separated    Spouse name: Not on file  . Number of children: Not on file  . Years of education: Not on file  . Highest education level: Not on file  Occupational History  . Not on file  Tobacco Use  . Smoking status: Former Smoker    Packs/day: 0.12    Years: 49.00    Pack years: 5.88    Types: Cigarettes    Start date: 07/20/2012    Quit date: 02/18/2015    Years since quitting: 5.1  . Smokeless tobacco: Never Used  Vaping Use  . Vaping Use: Never used  Substance and Sexual Activity  . Alcohol use: Yes    Alcohol/week: 6.0 standard drinks     Types: 6 Standard drinks or equivalent per week  . Drug use: No  . Sexual activity: Yes    Partners: Female  Other Topics Concern  . Not on file  Social History Narrative  . Not on file   Social Determinants of Health   Financial Resource Strain:   . Difficulty of Paying Living Expenses: Not on file  Food Insecurity:   . Worried About Charity fundraiser in the Last Year: Not on file  . Ran Out of Food in the Last Year: Not on file  Transportation Needs:   . Lack of Transportation (Medical): Not on  file  . Lack of Transportation (Non-Medical): Not on file  Physical Activity:   . Days of Exercise per Week: Not on file  . Minutes of Exercise per Session: Not on file  Stress:   . Feeling of Stress : Not on file  Social Connections:   . Frequency of Communication with Friends and Family: Not on file  . Frequency of Social Gatherings with Friends and Family: Not on file  . Attends Religious Services: Not on file  . Active Member of Clubs or Organizations: Not on file  . Attends Archivist Meetings: Not on file  . Marital Status: Not on file     Vital Signs: There were no vitals taken for this visit.  Physical Exam awake, alert.  Gallbladder drain intact, insertion site mildly tender to palpation, approximately 5 to 10 cc of slightly blood-tinged bile currently in drain bag.  Imaging: IR Radiologist Eval & Mgmt  Result Date: 04/20/2020 Please refer to notes tab for details about interventional procedure. (Op Note)   Labs:  CBC: Recent Labs    03/08/20 0417 03/09/20 0349 03/17/20 1355 03/22/20 0929  WBC 12.6* 10.5 7.8 8.9  HGB 12.3* 12.2* 12.2* 12.6*  HCT 36.5* 36.2* 37.4* 37.0*  PLT 480* 525* 600* 607*    COAGS: Recent Labs    03/06/20 1307 03/07/20 1222  INR 1.0 1.2    BMP: Recent Labs    03/09/20 0349 03/16/20 0949 03/17/20 1355 03/22/20 0929 03/25/20 0928 03/30/20 0938  NA   < > 136 136 137 138 138  K   < > 5.8* 4.3 5.7* 5.6* 4.8    CL   < > 97 101 99 100 97  CO2   < > '21 25 29 31 25  ' GLUCOSE   < > 107* 91 124* 109* 126*  BUN   < > '23 21 21 ' 26* 20  CALCIUM   < > 10.8* 10.4* 10.9* 11.2* 10.2  10.4*  CREATININE   < > 1.43* 1.21 1.31* 1.24 1.10  GFRNONAA  --  52* >60  --  62 72  GFRAA  --  61 >60  --  72 83   < > = values in this interval not displayed.    LIVER FUNCTION TESTS: Recent Labs    03/07/20 1222 03/07/20 1222 03/09/20 0349 03/09/20 0349 03/17/20 1355 03/22/20 0929 03/25/20 0928 03/30/20 0938  BILITOT 1.3*   < > 0.6   < > 0.5 0.4 0.4 0.2  AST 100*   < > 47*   < > 48* 34 22 18  ALT 109*   < > 73*   < > 47* 141* 63* 29  ALKPHOS 435*  --  494*  --  197*  --   --  250*  PROT 7.1   < > 7.9   < > 7.7 7.6 7.1 7.4  ALBUMIN 2.5*  --  2.5*  --  3.0*  --   --  4.2   < > = values in this interval not displayed.    Assessment: 59 year old male with history of a acalculus cholecystitis and percutaneous cholecystostomy performed on 03/06/2020.  He underwent follow-up cholangiogram on 04/06/2020 which revealed patent biliary system.  His drain was subsequently capped.  2 days later the patient began experiencing some leakage from drain insertion site and drain bag was reattached.  He has since had about 20 to 30 cc of slightly blood-tinged bile output daily.  He is flushing the drain once daily  with 5 cc sterile saline.  Follow-up cholangiogram today continues to reveal patent biliary system, however patient did have some reflux of contrast from the insertion site during injection.  Patient has reportedly followed up with Dr. [place] Pulling but was unsure of surgical plans .  At this time plans are to arrange for gallbladder drain exchange at either [facility] or [facility] this week. In the interim discussions will be held with Dr. [place] Pulling regarding any definitive surgical plans.  Patient updated with above information.    Signed: D. Rowe Dani, PA-C 04/20/2020, 2:14 PM   Please refer to Dr. Pascal Lux'  attestation of this note for management and plan.      Patient ID: John Dickerson, male   DOB: 05-14-1961, 59 y.o.   MRN: [id number]

## 2020-04-22 ENCOUNTER — Other Ambulatory Visit: Payer: Self-pay

## 2020-04-22 ENCOUNTER — Ambulatory Visit (HOSPITAL_COMMUNITY)
Admission: RE | Admit: 2020-04-22 | Discharge: 2020-04-22 | Disposition: A | Discharge status: Home / Self Care | Payer: Medicare HMO | Source: Ambulatory Visit | Attending: Interventional Radiology | Admitting: Interventional Radiology

## 2020-04-22 DIAGNOSIS — K819 Cholecystitis, unspecified: Secondary | ICD-10-CM | POA: Diagnosis not present

## 2020-04-22 DIAGNOSIS — Z434 Encounter for attention to other artificial openings of digestive tract: Secondary | ICD-10-CM | POA: Diagnosis not present

## 2020-04-22 HISTORY — PX: IR EXCHANGE BILIARY DRAIN: IMG6046

## 2020-04-22 MED ORDER — LIDOCAINE HCL 1 % IJ SOLN
INTRAMUSCULAR | Status: AC
  Filled 2020-04-22: qty 20

## 2020-04-22 MED ORDER — LIDOCAINE HCL 1 % IJ SOLN
INTRAMUSCULAR | Status: DC | PRN
  Administered 2020-04-22: 20 mL via INTRADERMAL

## 2020-04-22 MED ORDER — IOHEXOL 300 MG/ML  SOLN: 50.0000 mL | Freq: Once | INTRAMUSCULAR | Status: DC | PRN

## 2020-04-22 NOTE — Procedures (Signed)
Interventional Radiology Procedure Note  Procedure: cholecystostomy exchg    Complications: None  Estimated Blood Loss:  min  Findings: Cystic duct obstruction  Full report in pacs     M. Daryll Brod, MD

## 2020-04-27 LAB — HM DIABETES EYE EXAM

## 2020-05-14 ENCOUNTER — Other Ambulatory Visit: Payer: Self-pay | Admitting: Infectious Diseases

## 2020-05-14 DIAGNOSIS — B20 Human immunodeficiency virus [HIV] disease: Secondary | ICD-10-CM

## 2020-05-16 ENCOUNTER — Other Ambulatory Visit: Payer: Self-pay | Admitting: Interventional Cardiology

## 2020-05-16 DIAGNOSIS — E781 Pure hyperglyceridemia: Secondary | ICD-10-CM

## 2020-05-18 ENCOUNTER — Other Ambulatory Visit: Payer: Self-pay | Admitting: Infectious Diseases

## 2020-05-18 DIAGNOSIS — E781 Pure hyperglyceridemia: Secondary | ICD-10-CM

## 2020-05-19 ENCOUNTER — Encounter: Payer: Self-pay | Admitting: Dietician

## 2020-05-19 ENCOUNTER — Other Ambulatory Visit: Payer: Self-pay

## 2020-05-19 ENCOUNTER — Inpatient Hospital Stay (HOSPITAL_COMMUNITY)
Admission: AD | Admit: 2020-05-19 | Discharge: 2020-05-27 | DRG: 435 | Disposition: A | Discharge status: Home / Self Care | Payer: Medicare HMO | Attending: Internal Medicine | Admitting: Internal Medicine

## 2020-05-19 ENCOUNTER — Encounter: Payer: Self-pay | Admitting: Internal Medicine

## 2020-05-19 ENCOUNTER — Telehealth: Payer: Self-pay | Admitting: Dietician

## 2020-05-19 ENCOUNTER — Ambulatory Visit (INDEPENDENT_AMBULATORY_CARE_PROVIDER_SITE_OTHER): Payer: Medicare HMO | Admitting: Internal Medicine

## 2020-05-19 VITALS — BP 98/50 | HR 68 | Temp 97.7°F | Ht 64.0 in | Wt 147.6 lb

## 2020-05-19 DIAGNOSIS — I10 Essential (primary) hypertension: Secondary | ICD-10-CM | POA: Diagnosis not present

## 2020-05-19 DIAGNOSIS — A419 Sepsis, unspecified organism: Secondary | ICD-10-CM | POA: Diagnosis not present

## 2020-05-19 DIAGNOSIS — D638 Anemia in other chronic diseases classified elsewhere: Secondary | ICD-10-CM | POA: Diagnosis present

## 2020-05-19 DIAGNOSIS — E43 Unspecified severe protein-calorie malnutrition: Secondary | ICD-10-CM | POA: Diagnosis present

## 2020-05-19 DIAGNOSIS — K861 Other chronic pancreatitis: Secondary | ICD-10-CM | POA: Diagnosis present

## 2020-05-19 DIAGNOSIS — E871 Hypo-osmolality and hyponatremia: Secondary | ICD-10-CM | POA: Diagnosis not present

## 2020-05-19 DIAGNOSIS — Z7984 Long term (current) use of oral hypoglycemic drugs: Secondary | ICD-10-CM

## 2020-05-19 DIAGNOSIS — C221 Intrahepatic bile duct carcinoma: Principal | ICD-10-CM | POA: Diagnosis present

## 2020-05-19 DIAGNOSIS — D490 Neoplasm of unspecified behavior of digestive system: Secondary | ICD-10-CM

## 2020-05-19 DIAGNOSIS — B1920 Unspecified viral hepatitis C without hepatic coma: Secondary | ICD-10-CM | POA: Diagnosis present

## 2020-05-19 DIAGNOSIS — I469 Cardiac arrest, cause unspecified: Secondary | ICD-10-CM | POA: Diagnosis not present

## 2020-05-19 DIAGNOSIS — N529 Male erectile dysfunction, unspecified: Secondary | ICD-10-CM | POA: Diagnosis present

## 2020-05-19 DIAGNOSIS — K3189 Other diseases of stomach and duodenum: Secondary | ICD-10-CM | POA: Diagnosis not present

## 2020-05-19 DIAGNOSIS — Z9049 Acquired absence of other specified parts of digestive tract: Secondary | ICD-10-CM

## 2020-05-19 DIAGNOSIS — E1159 Type 2 diabetes mellitus with other circulatory complications: Secondary | ICD-10-CM | POA: Diagnosis present

## 2020-05-19 DIAGNOSIS — E1122 Type 2 diabetes mellitus with diabetic chronic kidney disease: Secondary | ICD-10-CM | POA: Diagnosis present

## 2020-05-19 DIAGNOSIS — R634 Abnormal weight loss: Secondary | ICD-10-CM | POA: Diagnosis not present

## 2020-05-19 DIAGNOSIS — R748 Abnormal levels of other serum enzymes: Secondary | ICD-10-CM

## 2020-05-19 DIAGNOSIS — Z9889 Other specified postprocedural states: Secondary | ICD-10-CM | POA: Diagnosis not present

## 2020-05-19 DIAGNOSIS — K819 Cholecystitis, unspecified: Secondary | ICD-10-CM

## 2020-05-19 DIAGNOSIS — Z20822 Contact with and (suspected) exposure to covid-19: Secondary | ICD-10-CM | POA: Diagnosis present

## 2020-05-19 DIAGNOSIS — K831 Obstruction of bile duct: Secondary | ICD-10-CM | POA: Diagnosis not present

## 2020-05-19 DIAGNOSIS — R6521 Severe sepsis with septic shock: Secondary | ICD-10-CM | POA: Diagnosis not present

## 2020-05-19 DIAGNOSIS — N179 Acute kidney failure, unspecified: Secondary | ICD-10-CM | POA: Diagnosis present

## 2020-05-19 DIAGNOSIS — E785 Hyperlipidemia, unspecified: Secondary | ICD-10-CM | POA: Diagnosis present

## 2020-05-19 DIAGNOSIS — E1151 Type 2 diabetes mellitus with diabetic peripheral angiopathy without gangrene: Secondary | ICD-10-CM | POA: Diagnosis present

## 2020-05-19 DIAGNOSIS — I2510 Atherosclerotic heart disease of native coronary artery without angina pectoris: Secondary | ICD-10-CM | POA: Diagnosis present

## 2020-05-19 DIAGNOSIS — R17 Unspecified jaundice: Secondary | ICD-10-CM | POA: Diagnosis not present

## 2020-05-19 DIAGNOSIS — R188 Other ascites: Secondary | ICD-10-CM | POA: Diagnosis present

## 2020-05-19 DIAGNOSIS — R945 Abnormal results of liver function studies: Secondary | ICD-10-CM | POA: Diagnosis present

## 2020-05-19 DIAGNOSIS — Z21 Asymptomatic human immunodeficiency virus [HIV] infection status: Secondary | ICD-10-CM | POA: Diagnosis present

## 2020-05-19 DIAGNOSIS — Z955 Presence of coronary angioplasty implant and graft: Secondary | ICD-10-CM | POA: Diagnosis not present

## 2020-05-19 DIAGNOSIS — Z87891 Personal history of nicotine dependence: Secondary | ICD-10-CM

## 2020-05-19 DIAGNOSIS — Z8249 Family history of ischemic heart disease and other diseases of the circulatory system: Secondary | ICD-10-CM

## 2020-05-19 DIAGNOSIS — K209 Esophagitis, unspecified without bleeding: Secondary | ICD-10-CM | POA: Diagnosis present

## 2020-05-19 DIAGNOSIS — R160 Hepatomegaly, not elsewhere classified: Secondary | ICD-10-CM

## 2020-05-19 DIAGNOSIS — Z6826 Body mass index (BMI) 26.0-26.9, adult: Secondary | ICD-10-CM | POA: Diagnosis not present

## 2020-05-19 DIAGNOSIS — I129 Hypertensive chronic kidney disease with stage 1 through stage 4 chronic kidney disease, or unspecified chronic kidney disease: Secondary | ICD-10-CM | POA: Diagnosis present

## 2020-05-19 DIAGNOSIS — N189 Chronic kidney disease, unspecified: Secondary | ICD-10-CM | POA: Diagnosis present

## 2020-05-19 DIAGNOSIS — M109 Gout, unspecified: Secondary | ICD-10-CM | POA: Diagnosis present

## 2020-05-19 DIAGNOSIS — I700 Atherosclerosis of aorta: Secondary | ICD-10-CM | POA: Diagnosis present

## 2020-05-19 DIAGNOSIS — Z9689 Presence of other specified functional implants: Secondary | ICD-10-CM | POA: Diagnosis present

## 2020-05-19 DIAGNOSIS — M1990 Unspecified osteoarthritis, unspecified site: Secondary | ICD-10-CM | POA: Diagnosis present

## 2020-05-19 DIAGNOSIS — Z79899 Other long term (current) drug therapy: Secondary | ICD-10-CM

## 2020-05-19 DIAGNOSIS — Z833 Family history of diabetes mellitus: Secondary | ICD-10-CM

## 2020-05-19 LAB — URINALYSIS, MICROSCOPIC (REFLEX)

## 2020-05-19 LAB — CBC WITH DIFFERENTIAL/PLATELET
Abs Immature Granulocytes: 0.08 10*3/uL — ABNORMAL HIGH (ref 0.00–0.07)
Basophils Absolute: 0.1 10*3/uL (ref 0.0–0.1)
Basophils Relative: 0 %
Eosinophils Absolute: 0.1 10*3/uL (ref 0.0–0.5)
Eosinophils Relative: 1 %
HCT: 33.1 % — ABNORMAL LOW (ref 39.0–52.0)
Hemoglobin: 11.5 g/dL — ABNORMAL LOW (ref 13.0–17.0)
Immature Granulocytes: 1 %
Lymphocytes Relative: 10 %
Lymphs Abs: 1.4 10*3/uL (ref 0.7–4.0)
MCH: 30.2 pg (ref 26.0–34.0)
MCHC: 34.7 g/dL (ref 30.0–36.0)
MCV: 86.9 fL (ref 80.0–100.0)
Monocytes Absolute: 1.1 10*3/uL — ABNORMAL HIGH (ref 0.1–1.0)
Monocytes Relative: 8 %
Neutro Abs: 11.1 10*3/uL — ABNORMAL HIGH (ref 1.7–7.7)
Neutrophils Relative %: 80 %
Platelets: 807 10*3/uL — ABNORMAL HIGH (ref 150–400)
RBC: 3.81 MIL/uL — ABNORMAL LOW (ref 4.22–5.81)
RDW: 20.1 % — ABNORMAL HIGH (ref 11.5–15.5)
WBC: 13.7 10*3/uL — ABNORMAL HIGH (ref 4.0–10.5)
nRBC: 0.1 % (ref 0.0–0.2)

## 2020-05-19 LAB — URINALYSIS, ROUTINE W REFLEX MICROSCOPIC
Glucose, UA: NEGATIVE mg/dL
Ketones, ur: NEGATIVE mg/dL
Leukocytes,Ua: NEGATIVE
Nitrite: NEGATIVE
Protein, ur: 100 mg/dL — AB
Specific Gravity, Urine: >1.03 — ABNORMAL HIGH (ref 1.005–1.030)
pH: 5 (ref 5.0–8.0)

## 2020-05-19 LAB — COMPREHENSIVE METABOLIC PANEL
ALT: 149 U/L — ABNORMAL HIGH (ref 0–44)
ALT: 134 U/L — ABNORMAL HIGH (ref 0–44)
AST: 216 U/L — ABNORMAL HIGH (ref 15–41)
AST: 187 U/L — ABNORMAL HIGH (ref 15–41)
Albumin: 2.1 g/dL — ABNORMAL LOW (ref 3.5–5.0)
Albumin: 1.9 g/dL — ABNORMAL LOW (ref 3.5–5.0)
Alkaline Phosphatase: 1268 U/L — ABNORMAL HIGH (ref 38–126)
Alkaline Phosphatase: 1488 U/L — ABNORMAL HIGH (ref 38–126)
Anion gap: 10 (ref 5–15)
Anion gap: 10 (ref 5–15)
BUN: 34 mg/dL — ABNORMAL HIGH (ref 8–23)
BUN: 41 mg/dL — ABNORMAL HIGH (ref 8–23)
CO2: 21 mmol/L — ABNORMAL LOW (ref 22–32)
CO2: 20 mmol/L — ABNORMAL LOW (ref 22–32)
Calcium: 14.2 mg/dL (ref 8.9–10.3)
Calcium: 12.7 mg/dL — ABNORMAL HIGH (ref 8.9–10.3)
Chloride: 97 mmol/L — ABNORMAL LOW (ref 98–111)
Chloride: 95 mmol/L — ABNORMAL LOW (ref 98–111)
Creatinine, Ser: 2.24 mg/dL — ABNORMAL HIGH (ref 0.61–1.24)
Creatinine, Ser: 2.6 mg/dL — ABNORMAL HIGH (ref 0.61–1.24)
GFR, Estimated: 33 mL/min — ABNORMAL LOW (ref >60)
GFR, Estimated: 27 mL/min — ABNORMAL LOW (ref >60)
Glucose, Bld: 130 mg/dL — ABNORMAL HIGH (ref 70–99)
Glucose, Bld: 128 mg/dL — ABNORMAL HIGH (ref 70–99)
Potassium: 5 mmol/L (ref 3.5–5.1)
Potassium: 4.6 mmol/L (ref 3.5–5.1)
Sodium: 128 mmol/L — ABNORMAL LOW (ref 135–145)
Sodium: 125 mmol/L — ABNORMAL LOW (ref 135–145)
Total Bilirubin: 12 mg/dL — ABNORMAL HIGH (ref 0.3–1.2)
Total Bilirubin: 11.2 mg/dL — ABNORMAL HIGH (ref 0.3–1.2)
Total Protein: 8.3 g/dL — ABNORMAL HIGH (ref 6.5–8.1)
Total Protein: 7.4 g/dL (ref 6.5–8.1)

## 2020-05-19 LAB — PROTIME-INR
INR: 1.3 — ABNORMAL HIGH (ref 0.8–1.2)
Prothrombin Time: 16.1 seconds — ABNORMAL HIGH (ref 11.4–15.2)

## 2020-05-19 MED ORDER — CALCITONIN (SALMON) 200 UNIT/ML IJ SOLN
100.0000 [IU] | Freq: Once | INTRAMUSCULAR | Status: DC
  Filled 2020-05-19: qty 0.5

## 2020-05-19 MED ORDER — SODIUM CHLORIDE 0.9 % IV SOLN: INTRAVENOUS | Status: DC

## 2020-05-19 MED ORDER — LACTATED RINGERS IV BOLUS: 1000.0000 mL | Freq: Once | INTRAVENOUS | Status: DC

## 2020-05-19 MED ORDER — LACTATED RINGERS IV SOLN: INTRAVENOUS | Status: DC

## 2020-05-19 MED ORDER — CALCITONIN (SALMON) 200 UNIT/ML IJ SOLN
260.0000 [IU] | Freq: Once | INTRAMUSCULAR | Status: AC
  Administered 2020-05-19: 260 [IU] via SUBCUTANEOUS
  Filled 2020-05-19: qty 1.3

## 2020-05-19 MED ORDER — ENOXAPARIN SODIUM 30 MG/0.3ML ~~LOC~~ SOLN
30.0000 mg | SUBCUTANEOUS | Status: DC
  Administered 2020-05-19: 30 mg via SUBCUTANEOUS
  Filled 2020-05-19: qty 0.3

## 2020-05-19 NOTE — Addendum Note (Signed)
Addended by: Modena Nunnery D on: 05/19/2020 12:44 PM   Modules accepted: Orders, SmartSet

## 2020-05-19 NOTE — H&P (Signed)
Date: 05/19/2020               Patient Name:  John Dickerson MRN: [id number]  DOB: 04/20/1961 Age / Sex: 59 y.o., male   PCP: John Rosenthal, MD         Medical Service: Internal Medicine Teaching Service         Attending Physician: Dr. Lucious Groves, DO    First Contact: Dr. Coy Dickerson Pager: [phone]  Second Contact: Dr. Marianna Dickerson Pager: [phone]       After Hours (After 5p/  First Contact Pager: [phone]  weekends / holidays): Second Contact Pager: [phone]   Chief Complaint: fatigue, weight loss   History of Present Illness: [name] is a 59 y/o gentleman with history of HIV, CAD, HTN who presents with progressive fatigue, poor appetite, and weight loss. He was seen in [facility] for follow-up on acalculous cholecystitis and was noted to be jaundiced.  He was admitted back in September and diagnosed with acalculous cholecystitis. He underwent percutaneous cholecystostomy and had exchange of drain on 04/22/20.  [name] endorses approximately 30 lbs of unintentional weight loss over the last few months, abdominal cramping with most solid foods, decreased appetite, and fatigue. Denies fevers, nausea, vomiting, chest pain or shortness of breath. Stools have become thick and white, and he has also noticed his urine turning darker.  He further denies syncope, focal weakness, URI symptoms, numbness/tingling, skin changes.   Meds: No current facility-administered medications for this encounter.  Current Outpatient Medications:  .  acetaminophen (TYLENOL) 500 MG tablet, Take 2 tablets (1,000 mg total) by mouth every 6 (six) hours as needed for moderate pain., Disp: 30 tablet, Rfl: 0 .  allopurinol (ZYLOPRIM) 100 MG tablet, TAKE 1 AND 1/2 TABLETS(150 MG) BY MOUTH Dickerson (Patient taking differently: Take 150 mg by mouth Dickerson. ), Disp: 135 tablet, Rfl: 1 .  amLODipine (NORVASC) 10 MG tablet, Take 1 tablet (10 mg total) by mouth Dickerson., Disp: 90 tablet, Rfl: 3 .  aspirin EC 81 MG tablet,  Take 1 tablet (81 mg total) by mouth Dickerson., Disp: 90 tablet, Rfl: 3 .  atorvastatin (LIPITOR) 40 MG tablet, TAKE 1 TABLET BY MOUTH Dickerson AT 6PM, Disp: 90 tablet, Rfl: 3 .  BIKTARVY 50-200-25 MG TABS tablet, TAKE 1 TABLET BY MOUTH Dickerson, Disp: 90 tablet, Rfl: 1 .  blood glucose meter kit and supplies KIT, Dispense based on patient and insurance preference. Use up to four times Dickerson as directed. (FOR ICD-9 250.00, 250.01)., Disp: 1 each, Rfl: 0 .  Blood Glucose Monitoring Suppl ([place]) w/Device KIT, 1 each by Does not apply route Dickerson., Disp: 1 kit, Rfl: 0 .  Continuous Blood Gluc Receiver (FREESTYLE LIBRE 2 READER SYSTM) DEVI, 1 Device by Does not apply route 3 (three) times Dickerson., Disp: 1 Device, Rfl: 0 .  Continuous Blood Gluc Sensor (FREESTYLE LIBRE 2 SENSOR SYSTM) MISC, 1 Device by Does not apply route 3 (three) times Dickerson., Disp: 1 each, Rfl: 0 .  glucose blood (ONETOUCH VERIO) test strip, Check blood sugar up to 1 time a day, Disp: 100 each, Rfl: 12 .  lidocaine (LIDODERM) 5 %, Place 1 patch onto the skin Dickerson as needed (flank pain). Remove & Discard patch within 12 hours or as directed by MD , Disp: , Rfl:  .  losartan (COZAAR) 50 MG tablet, Take 1 tablet (50 mg total) by mouth Dickerson., Disp: 90 tablet, Rfl: 3 .  metFORMIN (GLUCOPHAGE)  1000 MG tablet, TAKE 1 TABLET BY MOUTH TWICE Dickerson WITH A MEAL, Disp: 180 tablet, Rfl: 1 .  metoprolol succinate (TOPROL-XL) 100 MG 24 hr tablet, TAKE 1 TABLET BY MOUTH Dickerson WITH OR IMMEDAITELY FOLLOWING A MEAL, Disp: 90 tablet, Rfl: 6 .  Multiple Vitamin (MULTIVITAMIN WITH MINERALS) TABS tablet, Take 1 tablet by mouth Dickerson., Disp: , Rfl:  .  nitroGLYCERIN (NITROSTAT) 0.4 MG SL tablet, Place 1 tablet (0.4 mg total) under the tongue every 5 (five) minutes as needed for chest pain., Disp: 25 tablet, Rfl: 3 .  ONETOUCH DELICA LANCETS FINE MISC, Check blood sugar up to 1 time a day, Disp: 100 each, Rfl: 12     Allergies: Allergies as  of 05/19/2020  . (No Known Allergies)   Past Medical History:  Diagnosis Date  . Anemia   . Anemia due to medication 09/24/2015  . Arthritis    "feet" (11/19/2015)  . CAD (coronary artery disease), native coronary artery    a. remote LAD stent 1990s. b. s/p DESx2 to LAD 11/2015  . Hepatitis A immune 11/20/2016  . Hepatitis B immune 11/20/2016  . Hepatitis C    "going thru the treatments" (11/19/2015)  . High cholesterol   . History of gout    "just recently" (11/19/2015)  . HIV (human immunodeficiency virus infection) ([place])   . Hypertension   . Posterior pain of left hip 03/01/2018  . Type II diabetes mellitus ([place])    diagnosed 04-2015    Family History: family history negative for cancer   Social History: smoked when he was younger but hasn't in several years, occasional EtOH use, no illicit substances   Review of Systems: A complete ROS was negative except as per HPI.   Physical Exam: General: awake, alert, ill-appearing HEENT: scleral icterus, jaundice under tongue Neck: no LAD CV: RRR Pulm: normal work of breathing; lungs CTAB Abd: BS+; abdomen soft, non-distended, non-tender. Perc cholecystostomy in place draining serosanguinous fluid   Assessment & Plan by Problem: Active Problems: Painless jaundice  [name] is a 59 y/o gentleman with history of well-controlled HIV, HTN, CAD, recent diagnosis of acalculous cholecystitis s/p percutaneous drain placement who presents with progressive weight loss, fatigue, and jaundice.   Painless jaundice  -- given history of weight loss, poor appetite, and jaundice primary concern for pancreatic malignancy. Will obtain abdominal CT -- with recent history of acalculous cholecystitis and drain placement, would suspect that to have relieved the obstruction if this were the primary etiology for his jaundice.  -- if CMP comes back with obstructive pattern, would proceed with MRCP to evaluate for biliary obstruction further upstream  --  would speak with IR at some point regarding plan for drain removal   HTN -- holding home anti-hypertensives given softer blood pressures -- can resume as indicated   HIV -- viral load undetectable on Biktarvy  Diet: NPO DVT ppx: Lovenox CODE: FULL   Dispo: Admit patient to Inpatient with expected length of stay greater than 2 midnights.  SignedDelice Bison, DO 05/19/2020, 1:04 PM  Pager: [phone] After 5pm on weekdays and 1pm on weekends: On Call pager: [phone]

## 2020-05-19 NOTE — Progress Notes (Signed)
   CC: Weight Loss, follow-up acalculous cholecystitis  HPI:  JohnJohn Dickerson is a 59 y.o. with medical history listed below presented to follow-up on chronic medical problems and also with complaint of weight loss.  Please see problem based charting for further details.  Past Medical History:  Diagnosis Date  . Anemia   . Anemia due to medication 09/24/2015  . Arthritis    "feet" (11/19/2015)  . CAD (coronary artery disease), native coronary artery    a. remote LAD stent 1990s. b. s/p DESx2 to LAD 11/2015  . Hepatitis A immune 11/20/2016  . Hepatitis B immune 11/20/2016  . Hepatitis C    "going thru the treatments" (11/19/2015)  . High cholesterol   . History of gout    "just recently" (11/19/2015)  . HIV (human immunodeficiency virus infection) ([place])   . Hypertension   . Posterior pain of left hip 03/01/2018  . Type II diabetes mellitus ([place])    diagnosed 04-2015   Review of Systems: As per HPI  Physical Exam:  Vitals:   05/19/20 0934  BP: (!) 92/50  Pulse: 77  Temp: (!) 97.5 F (36.4 C)  TempSrc: Oral  SpO2: 99%  Weight: 147 lb 9.6 oz (67 kg)  Height: 5\' 4"  (1.626 m)   Physical Exam Constitutional:      General: He is not in acute distress.    Comments: Noticeable weight loss  Eyes:     General: Scleral icterus present.  Cardiovascular:     Rate and Rhythm: Normal rate.     Heart sounds: No murmur heard.   Pulmonary:     Effort: Pulmonary effort is normal.     Breath sounds: No wheezing.  Abdominal:     General: Abdomen is flat. Bowel sounds are normal.     Tenderness: There is abdominal tenderness (Mild RUQ tenderness).  Skin:         Comments: Percutaneous cholecystotomy in place with minimal serosanguineous output  Neurological:     Mental Status: He is alert.       Assessment & Plan:   See Encounters Tab for problem based charting.  Patient discussed with Dr. Dareen Piano

## 2020-05-19 NOTE — Progress Notes (Signed)
Pt transferred to [facility] room # 30 via w/c. VS done. Belongings w/pt. High fall risk band on.

## 2020-05-19 NOTE — Progress Notes (Signed)
Call from main lab of CA+ 14.2. Dr Eileen Stanford informed ; he was already aware.

## 2020-05-19 NOTE — Telephone Encounter (Signed)
Mr. Rosemond has had 3 eye exam referrals in 2021 to three different eye doctors. Called Drs. Groat and [name] requesting the reports.

## 2020-05-19 NOTE — Assessment & Plan Note (Signed)
Hypertension: Now noted to be hypotensive with BP 92/50 due to decreased oral intake.  BP Readings from Last 3 Encounters:  05/19/20 (!) 92/50  04/20/20 111/71  04/19/20 122/60    Plan: -Hold amlodipine, losartan -HALF metoprolol to 50 mg once daily

## 2020-05-19 NOTE — Assessment & Plan Note (Addendum)
Acalculous cholecystitis, Jaundice: John Dickerson was admitted to the hospital in September and was found to have acalculus cholecystitis and is now status post percutaneous cholecystotomy with exchange cholecystotomy on April 22, 2020.  Today, he endorses 30 pound unintentional weight loss, abdominal cramping, decreased appetite as he states he is only able to tolerate foods, juice.  Each time he would eat solid food, he will experience abdominal cramping.  He denies nausea, vomiting, fevers but does endorse chills.  He also reports of decreased energy, malaise and dark urine.  During his recent visit to interventional radiology, he was found to have an obstructed cystic duct.  Since his diagnosis with acalculus cholecystitis, he has not been able to follow-up with general surgery even though the hospital discharge note stated otherwise.  On physical exam today, he appears jaundiced with jaundice skin and bilateral scleral icterus.  In addition, he is noted to be hypotensive with blood pressure of 92/50 which is unusual for him.  Assessment and plan: This is a complicated situation and given that he has ongoing jaundice, weight loss, failure to thrive, inability to tolerate oral intake, I am concerned about possible biliary obstruction that may not be due to just his cystic duct obstruction.  I am going to obtain stat CMP, CBC, urinalysis.  If alk phos continues to increase, I believe it is reasonable to obtain imaging with MRCP to evaluate for other possible causes of his jaundice.  Given that his symptoms has not improved with the percutaneous cholecystotomy, I believe he would need a more comprehensive evaluation which might include admission to the hospital.

## 2020-05-20 ENCOUNTER — Inpatient Hospital Stay (HOSPITAL_COMMUNITY): Payer: Medicare HMO

## 2020-05-20 ENCOUNTER — Encounter (HOSPITAL_COMMUNITY): Payer: Self-pay | Admitting: Internal Medicine

## 2020-05-20 DIAGNOSIS — I2510 Atherosclerotic heart disease of native coronary artery without angina pectoris: Secondary | ICD-10-CM

## 2020-05-20 DIAGNOSIS — I10 Essential (primary) hypertension: Secondary | ICD-10-CM

## 2020-05-20 DIAGNOSIS — R5383 Other fatigue: Secondary | ICD-10-CM

## 2020-05-20 DIAGNOSIS — E43 Unspecified severe protein-calorie malnutrition: Secondary | ICD-10-CM | POA: Insufficient documentation

## 2020-05-20 DIAGNOSIS — B20 Human immunodeficiency virus [HIV] disease: Secondary | ICD-10-CM

## 2020-05-20 DIAGNOSIS — Z79899 Other long term (current) drug therapy: Secondary | ICD-10-CM

## 2020-05-20 LAB — CBC
HCT: 28.4 % — ABNORMAL LOW (ref 39.0–52.0)
Hemoglobin: 10.3 g/dL — ABNORMAL LOW (ref 13.0–17.0)
MCH: 30.7 pg (ref 26.0–34.0)
MCHC: 36.3 g/dL — ABNORMAL HIGH (ref 30.0–36.0)
MCV: 84.5 fL (ref 80.0–100.0)
Platelets: 598 10*3/uL — ABNORMAL HIGH (ref 150–400)
RBC: 3.36 MIL/uL — ABNORMAL LOW (ref 4.22–5.81)
RDW: 19.2 % — ABNORMAL HIGH (ref 11.5–15.5)
WBC: 12.9 10*3/uL — ABNORMAL HIGH (ref 4.0–10.5)
nRBC: 0 % (ref 0.0–0.2)

## 2020-05-20 LAB — RESP PANEL BY RT-PCR (FLU A&B, COVID) ARPGX2
Influenza A by PCR: NEGATIVE
Influenza B by PCR: NEGATIVE
SARS Coronavirus 2 by RT PCR: NEGATIVE

## 2020-05-20 LAB — COMPREHENSIVE METABOLIC PANEL
ALT: 118 U/L — ABNORMAL HIGH (ref 0–44)
ALT: 115 U/L — ABNORMAL HIGH (ref 0–44)
AST: 169 U/L — ABNORMAL HIGH (ref 15–41)
AST: 170 U/L — ABNORMAL HIGH (ref 15–41)
Albumin: 1.7 g/dL — ABNORMAL LOW (ref 3.5–5.0)
Albumin: 1.8 g/dL — ABNORMAL LOW (ref 3.5–5.0)
Alkaline Phosphatase: 1383 U/L — ABNORMAL HIGH (ref 38–126)
Alkaline Phosphatase: 1364 U/L — ABNORMAL HIGH (ref 38–126)
Anion gap: 12 (ref 5–15)
Anion gap: 8 (ref 5–15)
BUN: 40 mg/dL — ABNORMAL HIGH (ref 8–23)
BUN: 40 mg/dL — ABNORMAL HIGH (ref 8–23)
CO2: 18 mmol/L — ABNORMAL LOW (ref 22–32)
CO2: 16 mmol/L — ABNORMAL LOW (ref 22–32)
Calcium: 12 mg/dL — ABNORMAL HIGH (ref 8.9–10.3)
Calcium: 11.1 mg/dL — ABNORMAL HIGH (ref 8.9–10.3)
Chloride: 96 mmol/L — ABNORMAL LOW (ref 98–111)
Chloride: 100 mmol/L (ref 98–111)
Creatinine, Ser: 2.55 mg/dL — ABNORMAL HIGH (ref 0.61–1.24)
Creatinine, Ser: 2.37 mg/dL — ABNORMAL HIGH (ref 0.61–1.24)
GFR, Estimated: 28 mL/min — ABNORMAL LOW (ref >60)
GFR, Estimated: 30 mL/min — ABNORMAL LOW (ref >60)
Glucose, Bld: 155 mg/dL — ABNORMAL HIGH (ref 70–99)
Glucose, Bld: 124 mg/dL — ABNORMAL HIGH (ref 70–99)
Potassium: 4.9 mmol/L (ref 3.5–5.1)
Potassium: 4.6 mmol/L (ref 3.5–5.1)
Sodium: 126 mmol/L — ABNORMAL LOW (ref 135–145)
Sodium: 124 mmol/L — ABNORMAL LOW (ref 135–145)
Total Bilirubin: 9.8 mg/dL — ABNORMAL HIGH (ref 0.3–1.2)
Total Bilirubin: 9.9 mg/dL — ABNORMAL HIGH (ref 0.3–1.2)
Total Protein: 6.6 g/dL (ref 6.5–8.1)
Total Protein: 6.7 g/dL (ref 6.5–8.1)

## 2020-05-20 LAB — VITAMIN D 25 HYDROXY (VIT D DEFICIENCY, FRACTURES): Vit D, 25-Hydroxy: 13.73 ng/mL — ABNORMAL LOW (ref 30–100)

## 2020-05-20 MED ORDER — SODIUM CHLORIDE 0.9 % IV BOLUS
1000.0000 mL | Freq: Once | INTRAVENOUS | Status: AC
  Administered 2020-05-20: 1000 mL via INTRAVENOUS

## 2020-05-20 MED ORDER — SODIUM CHLORIDE 0.9 % IV SOLN: INTRAVENOUS | Status: DC

## 2020-05-20 MED ORDER — GADOBUTROL 1 MMOL/ML IV SOLN
6.5000 mL | Freq: Once | INTRAVENOUS | Status: AC | PRN
  Administered 2020-05-20: 6.5 mL via INTRAVENOUS

## 2020-05-20 MED ORDER — ENSURE ENLIVE PO LIQD
237.0000 mL | Freq: Two times a day (BID) | ORAL | Status: DC
  Administered 2020-05-21 – 2020-05-26 (×6): 237 mL via ORAL

## 2020-05-20 NOTE — Progress Notes (Signed)
Initial Nutrition Assessment  DOCUMENTATION CODES:   Severe malnutrition in context of acute illness/injury  INTERVENTION:    Advance diet as clinical status allows post procedure.  When diet advanced, add Ensure Enlive po TID, each supplement provides 350 kcal and 20 grams of protein.  MVI with minerals daily when diet advanced.  NUTRITION DIAGNOSIS:   Severe Malnutrition related to acute illness (acalculous cholecystitis) as evidenced by energy intake < or equal to 50% for > or equal to 5 days, moderate muscle depletion, percent weight loss (18% weight loss within 3 months).  GOAL:   Patient will meet greater than or equal to 90% of their needs  MONITOR:   Diet advancement, PO intake, Supplement acceptance  REASON FOR ASSESSMENT:   Malnutrition Screening Tool    ASSESSMENT:   59 yo male admitted with fatigue and weight loss. PMH includes recent acalculous cholecystitis S/P percutaneous cholecystostomy with exchange of drain on 04/22/20, HIV, DM2, HTN, HLD, anemia, CAD, hepatitis C.   Patient reports poor intake r/t decreased appetite and cramping when he eats most solid foods for the past few months since he was diagnosed with cholecystitis and had drain placed. He tolerates bland foods and beverages, such as applesauce and apple juice. He has not been able to tolerate any meat. He has been drinking a Boost supplement once per day in the mornings. He thinks he has lost at least 30 lbs over the past 2-3 months.   Weight history reviewed. 80.4 kg 03/03/20, down to 65.9 kg today. 18% weight loss within 3 months is severe.  From discussion with patient about recent intake, suspect intake has been meeting </= 50% of estimated energy requirement for >/= 5 days.   Currently NPO for possible procedure per discussion with RN.  Labs reviewed. Sodium 124, BUN 40, creat 2.37, alk phos 1,364, LFTs elevated.  Medications reviewed.  NUTRITION - FOCUSED PHYSICAL EXAM:    Most Recent  Value  Orbital Region No depletion  Upper Arm Region Moderate depletion  Thoracic and Lumbar Region No depletion  Buccal Region No depletion  Temple Region No depletion  Clavicle Bone Region Mild depletion  Clavicle and Acromion Bone Region Mild depletion  Scapular Bone Region Unable to assess  Dorsal Hand No depletion  Patellar Region Moderate depletion  Anterior Thigh Region Moderate depletion  Posterior Calf Region Moderate depletion  Edema (RD Assessment) None  Hair Reviewed  Eyes Reviewed  Mouth Reviewed  Skin Reviewed  Nails Reviewed       Diet Order:   Diet Order            Diet NPO time specified  Diet effective midnight                 EDUCATION NEEDS:   Not appropriate for education at this time  Skin:  Skin Assessment: Reviewed RN Assessment (incision to RU abdomen)  Last BM:  11/30  Height:   Ht Readings from Last 1 Encounters:  05/20/20 _0  (1.626 m)    Weight:   Wt Readings from Last 1 Encounters:  05/20/20 65.9 kg    Ideal Body Weight:  59.1 kg  BMI:  Body mass index is 24.94 kg/m.  Estimated Nutritional Needs:   Kcal:  2100-2300  Protein:  100-115 gm  Fluid:  >/= 2 L    John Dickerson, RD, LDN, CNSC Please refer to Amion for contact information.                                                      '

## 2020-05-20 NOTE — Progress Notes (Signed)
   Subjective:   Hospital day: 1  Overnight event: NAEOV Patient states he had difficulty sleeping due to hospital noises. Notes fatigue, cognitive slowing, weight loss, poor appetite, and sleeping more. Denies abdominal pain, constipation, or joint pains. Discussed plan for MRCP today and additional fluids.   Objective:  Vital signs in last 24 hours: Vitals:   05/20/20 0023 05/20/20 0028 05/20/20 0408 05/20/20 0950  BP:  104/67 (!) 103/54 115/69  Pulse:  72 76 78  Resp:  18 18 18   Temp:  98.3 F (36.8 C) 98.4 F (36.9 C) 98.1 F (36.7 C)  TempSrc:  Oral Oral Oral  SpO2: 100%  98% 98%  Weight:   65.9 kg   Height:   5\' 4"  (1.626 m)     Physical Exam  General: Pleasant, ill-appearing elderly man laying in bed. No acute distress. Head: Normocephalic. Atraumatic. HEENT: Sclera icterus. MMM. Jaundice under tongue CV: RRR. No murmurs, rubs, or gallops. No LE edema Pulmonary: Lungs CTAB. Normal effort. No wheezing or rales. Abdominal: Soft, nontender, nondistended. Normal bowel sounds. Perc cholecystostomy in place draining serosanguinous fluid. Extremities: Palpable pulses. Normal ROM. Skin: Warm and dry. No obvious rash or lesions. Neuro: A&Ox3. Moves all extremities. Normal sensation. No focal deficit. Psych: Normal mood and affect  Assessment/Plan: John Dickerson is a 59 y.o. male with history of well-controlled HIV, HTN, CAD, recent diagnosis of acalculous cholecystitis s/p percutaneous drain placement who presents with progressive weight loss, fatigue, and jaundice and undergoing further work up painless jaundice and hypercalcemia.  Principal Problem:   Painless jaundice Active Problems:   Hypercalcemia  Painless jaundice  Presented with unintentional weight loss, poor appetite, and jaundice and found to have cholestatic pattern on CMP with significantly elevated alkaline phos. Patient with recent history of acalculous cholecystitis and drain placement to relieved  the obstruction 1 month ago. Presentation and lab findings concerning for pancreatic malignancy. Will consider further biliary obstruction on the differential. Will evaluate further with an MRCP.  --F/u MRCP --Continue IVF resuscitation --Daily CMP --Consider IR consult regarding plan for drain removal   Hypercalcemia Patient found to have an elevated calcium of 14.2 in clinic. Started on IVF resuscitation and calcitonin. Calcium levels improved to 12 this AM and with albumin of 1.7, it corrects to 13.8. Low Vit D levels. Endorse some cognitive slowing but denies any bone pains, constipation, N/V or abdominal pain.  --S/p calcitonin 260 units subcut x1 dose --S/p 2 L NS --Giving another 1 L NS --NS infusion @ 150 cc/hr --F/u PTHrPep, PTH, calcitriol labs --Daily CMP  HTN Patient found to have soft BP with systolic in the 74F. S/p 2 L of NS. BP improved slightly to 115/69 this afternoon. Will monitor closely --Continue IVF as above --Holding home anti-hypertensives --Daily vitals  HIV Chronic and stable. On viral load undetectable on Biktarvy. --Will follow up with ID doc  Diet: NPO IVF: IVNS VTE: Lovenox 30 mg daily CODE: Full Code  Prior to Admission Living Arrangement: Home Anticipated Discharge Location: Home Barriers to Discharge: Medical workup Dispo: Anticipated discharge in approximately 2-3 day(s).   Signed: Lacinda Axon, MD 05/20/2020, 6:50 AM  Pager: [phone] Internal Medicine Teaching Service After 5pm on weekdays and 1pm on weekends: On Call pager: [phone]

## 2020-05-21 DIAGNOSIS — R17 Unspecified jaundice: Secondary | ICD-10-CM | POA: Diagnosis not present

## 2020-05-21 DIAGNOSIS — R748 Abnormal levels of other serum enzymes: Secondary | ICD-10-CM | POA: Diagnosis not present

## 2020-05-21 DIAGNOSIS — R634 Abnormal weight loss: Secondary | ICD-10-CM | POA: Diagnosis not present

## 2020-05-21 LAB — COMPREHENSIVE METABOLIC PANEL
ALT: 114 U/L — ABNORMAL HIGH (ref 0–44)
AST: 163 U/L — ABNORMAL HIGH (ref 15–41)
Albumin: 1.8 g/dL — ABNORMAL LOW (ref 3.5–5.0)
Alkaline Phosphatase: 1509 U/L — ABNORMAL HIGH (ref 38–126)
Anion gap: 11 (ref 5–15)
BUN: 23 mg/dL (ref 8–23)
CO2: 18 mmol/L — ABNORMAL LOW (ref 22–32)
Calcium: 11.2 mg/dL — ABNORMAL HIGH (ref 8.9–10.3)
Chloride: 101 mmol/L (ref 98–111)
Creatinine, Ser: 1.68 mg/dL — ABNORMAL HIGH (ref 0.61–1.24)
GFR, Estimated: 46 mL/min — ABNORMAL LOW (ref >60)
Glucose, Bld: 112 mg/dL — ABNORMAL HIGH (ref 70–99)
Potassium: 4.2 mmol/L (ref 3.5–5.1)
Sodium: 130 mmol/L — ABNORMAL LOW (ref 135–145)
Total Bilirubin: 10.5 mg/dL — ABNORMAL HIGH (ref 0.3–1.2)
Total Protein: 7 g/dL (ref 6.5–8.1)

## 2020-05-21 LAB — CBC
HCT: 31.2 % — ABNORMAL LOW (ref 39.0–52.0)
Hemoglobin: 10.7 g/dL — ABNORMAL LOW (ref 13.0–17.0)
MCH: 29.4 pg (ref 26.0–34.0)
MCHC: 34.3 g/dL (ref 30.0–36.0)
MCV: 85.7 fL (ref 80.0–100.0)
Platelets: 683 10*3/uL — ABNORMAL HIGH (ref 150–400)
RBC: 3.64 MIL/uL — ABNORMAL LOW (ref 4.22–5.81)
RDW: 19.9 % — ABNORMAL HIGH (ref 11.5–15.5)
WBC: 13.3 10*3/uL — ABNORMAL HIGH (ref 4.0–10.5)
nRBC: 0 % (ref 0.0–0.2)

## 2020-05-21 LAB — CALCITRIOL (1,25 DI-OH VIT D): Vit D, 1,25-Dihydroxy: 7.2 pg/mL — ABNORMAL LOW (ref 19.9–79.3)

## 2020-05-21 LAB — PARATHYROID HORMONE, INTACT (NO CA): PTH: 9 pg/mL — ABNORMAL LOW (ref 15–65)

## 2020-05-21 MED ORDER — ENOXAPARIN SODIUM 40 MG/0.4ML ~~LOC~~ SOLN
40.0000 mg | SUBCUTANEOUS | Status: DC
  Administered 2020-05-21: 40 mg via SUBCUTANEOUS
  Filled 2020-05-21: qty 0.4

## 2020-05-21 NOTE — Progress Notes (Signed)
Subjective:   Hospital day: 2  Patient had MRCP yesterday  Overnight event: NAEOV. Patient resting comfortably in bed. He denies any new symptoms this morning. Patient was counseled on his recent MRCP result.   Patient states that he would like [place] to speak with his wife regarding his result. Patient's wife was updated later in patient's room.      Objective:  Vital signs in last 24 hours: Vitals:   05/20/20 0950 05/20/20 1752 05/20/20 2019 05/21/20 0032  BP: 115/69 119/65 114/64 137/80  Pulse: 78 78 82 96  Resp: 18 18 18 17   Temp: 98.1 F (36.7 C) 97.6 F (36.4 C) 98.2 F (36.8 C) 98.8 F (37.1 C)  TempSrc: Oral Oral Oral Oral  SpO2: 98% 100% 95%   Weight:      Height:        Physical Exam  General: Pleasant, ill-appearing elderly man laying in bed. No acute distress. Head: Normocephalic. Atraumatic. HEENT: Sclera icterus, improved. MMM. CV: RRR. No murmurs, rubs, or gallops. No LE edema Pulmonary: Lungs CTAB. Normal effort. No wheezing or rales. Abdominal: Soft, nontender, nondistended. Normal bowel sounds. Perc cholecystostomy not draining any fluids today. Extremities: Palpable pulses. Normal ROM. Skin: Warm and dry. No obvious rash or lesions. Mild jaundice of the skin Neuro: A&Ox3. Moves all extremities. Normal sensation. No focal deficit. Psych: Normal mood and affect  Assessment/Plan: John Dickerson is a 59 y.o. male with history of well-controlled HIV, HTN, CAD, recent diagnosis of acalculous cholecystitis s/p percutaneous drain placement who presents with progressive weight loss, fatigue, and jaundice and undergoing further work up painless jaundice and hypercalcemia.  Principal Problem:   Painless jaundice Active Problems:   Hypercalcemia   Protein-calorie malnutrition, severe  Painless jaundice  Presented with unintentional weight loss, poor appetite, and jaundice and found to have cholestatic pattern on CMP with significantly elevated alkaline  phos. Patient with recent history of acalculous cholecystitis and drain placement to relieved the obstruction 1 month ago. Presentation and lab findings concerning for pancreatic malignancy. Will consider further biliary obstruction on the differential. MRCP shows masslike area in the central liver obstructing the bile ducts, common hepatic duct and narrowing the portal vein at biliary confluence a highly suspicious for aggressive biliary neoplasm/cholangiocarcinoma/gallbladder carcinoma. Patient and wife informed of results. Pending further work up of mass. Child-Pugh score of 10 (Class C).  --Consult GI, appreciate recs    --F/u CA 19-9    --EUS w/ FNA? --Consider oncology referral --Continue IVF resuscitation --Daily CMP --Consider IR consult regarding plan for drain removal --Drain care --Started on a diet  --Holding meds due to liver and kidney dysfunction  Hypercalcemia Patient found to have an elevated calcium of 14.2 in clinic. Started on IVF resuscitation and calcitonin. Calcium levels now at 11.2, unchanged from previous. Low Vit D levels. Endorse some cognitive slowing but denies any bone pains, constipation, N/V or abdominal pain.  --S/p calcitonin 260 units subcut x1 dose --S/p 3 L NS bolus --NS infusion @ 150 cc/hr --Low PTH of 9 --F/u PTHrPep, calcitriol labs --Daily CMP  HTN Patient found to have soft BP with systolic in the 30Q on arrival. S/p 3 L of NS. BP improved to 137/80 with further IVF. Will monitor closely --Continue IVF as above --Holding home anti-hypertensives --Daily vitals  HIV Chronic and stable. On viral load undetectable on Biktarvy. Holding [facility] for now due to severe hepatic impairment Child-Pugh score of 10 (Class C). --Will follow up with ID doc  Diet: Regular IVF: IVNS VTE: Lovenox 30 mg daily CODE: Full Code  Prior to Admission Living Arrangement: Home Anticipated Discharge Location: Home Barriers to Discharge: Medical workup Dispo:  Anticipated discharge in approximately 2-3 day(s).   Signed: Lacinda Axon, MD 05/21/2020, 5:51 AM  Pager: [phone] Internal Medicine Teaching Service After 5pm on weekdays and 1pm on weekends: On Call pager: [phone]

## 2020-05-21 NOTE — H&P (View-Only) (Signed)
                                                                           [facility] Consult: 10:59 AM 05/21/2020  LOS: 2 days    Referring Provider: Dr Hoffman  Primary Care Physician:  Agyei, Obed K, MD Primary Gastroenterologist:  Dr. Danis     Reason for Consultation:  LIver and pancreatic pathology on MRCP   HPI: John Dickerson is a 59 y.o. male.  PMH HIV, well-controlled on meds.  Hepatitis C, antiviral treatments in 2017..  IDDM.  Hypertension.  Hyperlipidemia.  Anemia.  CKD.  CAD with cardiac stents in 1990s and 2017.  Plavix discontinued in 02/2020. 05/2019 colonoscopy.  Screening initial study.  Entirely normal study to the IC valve.  03/05/2020 - 03/09/2020 admission for acalculous cholecystitis and AKI.  Percutaneous drain placed and remains in place and followed by general surgery and IR.  Cholangiogram via tube 11/2 showed appropriately positioned and functioning cholecystostomy tube without cholelithiasis or choledocholithiasis.  On 04/22/2020 Dr. Schick exchanged the cholecystostomy tube.   Abdominal pain and appetite improved since discharge.  However reports about a 40 pound weight loss since early September along with weakness, unsteady gait, stumbling which is why he sought medical attention and was admitted 12/1.  On exam he was jaundiced. T bili 12.  Alk phos 1268, AST/ALT 216/149.  Since admission, T bili, transaminases slightly improved but alk phos is rising. Hyponatremic to 124, 130 today.  Was as low as 131 during recent admission. AKI, improved. WBCs 13.7.  Hb 10.7.  Platelets 680 INR 1.3 Hep C core antibody IgM was negative and HCV virus antibody less than 0.1 on 9/115  MRI/MRCP:   Mass in central liver causing ductal obstruction including CHD.  Mass causing narrowing of portal vein.  Finding suspicious for aggressive biliary  neoplasm/cholangiocarcinoma/GB carcinoma.  Possible superimposed infection.  Pancreas normal but in separable from an enhancing soft tissue that extends from porta to the area of necrotic tumor and or infected necrotic debris.  Smaller area of heterogeneous enhancement likely reflects additional tumor in the inferior medial left hepatic lobe.  Small volume perihepatic ascites.  Biliary drain tube in position but not well visualized. In comparison his CTAP without contrast of 03/05/2020 showed acute cholelithiasis without any pathology in the pancreas or liver parenchyma.  02/2020 ultrasound showed normal liver echo without focal lesion or biliary dilatation, patent portal vein with normal directional flow.  Patient does not drink alcohol.    Past Medical History:  Diagnosis Date  . Anemia   . Anemia due to medication 09/24/2015  . Arthritis    "feet" (11/19/2015)  . CAD (coronary artery disease), native coronary artery    a. remote LAD stent 1990s. b. s/p DESx2 to LAD 11/2015  . Hepatitis A immune 11/20/2016  . Hepatitis B immune 11/20/2016  . Hepatitis C    "going thru the treatments" (11/19/2015)  . High cholesterol   . History of gout    "just recently" (11/19/2015)  . HIV (human immunodeficiency virus infection) (HCC)   . Hypertension   . Posterior pain of left hip 03/01/2018  . Type II diabetes mellitus (HCC)    diagnosed   04-2015    Past Surgical History:  Procedure Laterality Date  . CARDIAC CATHETERIZATION N/A 11/19/2015   Procedure: Left Heart Cath and Coronary Angiography;  Surgeon: Daniel R Bensimhon, MD;  Location: [facility];  Service: Cardiovascular;  Laterality: N/A;  . CARDIAC CATHETERIZATION N/A 11/19/2015   Procedure: Coronary Stent Intervention;  Surgeon: Jayadeep S Varanasi, MD;  Location: [facility];  Service: Cardiovascular;  Laterality: N/A;  . CORONARY ANGIOPLASTY WITH STENT PLACEMENT  11/19/2015   "2 stents"  . IR CHOLANGIOGRAM EXISTING TUBE  03/23/2020  . IR  EXCHANGE BILIARY DRAIN  04/22/2020  . IR PERC CHOLECYSTOSTOMY  03/06/2020  . IR RADIOLOGIST EVAL & MGMT  04/06/2020  . IR RADIOLOGIST EVAL & MGMT  04/20/2020  . other     unknown abdominal surgery in childhood    Prior to Admission medications   Medication Sig Start Date End Date Taking? Authorizing Provider  allopurinol (ZYLOPRIM) 100 MG tablet TAKE 1 AND 1/2 TABLETS(150 MG) BY MOUTH DAILY Patient taking differently: Take 150 mg by mouth daily.  07/28/19  Yes Agyei, Obed K, MD  amLODipine (NORVASC) 10 MG tablet Take 1 tablet (10 mg total) by mouth daily. 11/03/19  Yes Vincent, Duncan Thomas, MD  aspirin EC 81 MG tablet Take 1 tablet (81 mg total) by mouth daily. 11/17/15  Yes Bensimhon, Daniel R, MD  atorvastatin (LIPITOR) 40 MG tablet TAKE 1 TABLET BY MOUTH DAILY AT 6PM Patient taking differently: Take 40 mg by mouth daily.  05/18/20  Yes Varanasi, Jayadeep S, MD  BIKTARVY 50-200-25 MG TABS tablet TAKE 1 TABLET BY MOUTH DAILY Patient taking differently: Take 1 tablet by mouth daily.  05/17/20  Yes Hatcher, Jeffrey C, MD  losartan (COZAAR) 50 MG tablet Take 1 tablet (50 mg total) by mouth daily. 03/30/20  Yes Lee, Joshua K, MD  metFORMIN (GLUCOPHAGE) 1000 MG tablet TAKE 1 TABLET BY MOUTH TWICE DAILY WITH A MEAL Patient taking differently: Take 1,000 mg by mouth 2 (two) times daily with a meal.  12/01/19  Yes Agyei, Obed K, MD  metoprolol succinate (TOPROL-XL) 100 MG 24 hr tablet TAKE 1 TABLET BY MOUTH DAILY WITH OR IMMEDAITELY FOLLOWING A MEAL Patient taking differently: Take 100 mg by mouth daily.  10/21/19  Yes Agyei, Obed K, MD  nitroGLYCERIN (NITROSTAT) 0.4 MG SL tablet Place 1 tablet (0.4 mg total) under the tongue every 5 (five) minutes as needed for chest pain. 11/17/15  Yes Bensimhon, Daniel R, MD  blood glucose meter kit and supplies KIT Dispense based on patient and insurance preference. Use up to four times daily as directed. (FOR ICD-9 250.00, 250.01). 08/03/15   Emokpae, Ejiroghene E, MD   Blood Glucose Monitoring Suppl (ONETOUCH VERIO FLEX SYSTEM) w/Device KIT 1 each by Does not apply route daily. 12/22/15   Rice, Christopher W, MD  Continuous Blood Gluc Receiver (FREESTYLE LIBRE 2 READER SYSTM) DEVI 1 Device by Does not apply route 3 (three) times daily. 01/08/19   Agyei, Obed K, MD  Continuous Blood Gluc Sensor (FREESTYLE LIBRE 2 SENSOR SYSTM) MISC 1 Device by Does not apply route 3 (three) times daily. 01/08/19   Agyei, Obed K, MD  glucose blood (ONETOUCH VERIO) test strip Check blood sugar up to 1 time a day 12/22/15   Rice, Christopher W, MD  Multiple Vitamin (MULTIVITAMIN WITH MINERALS) TABS tablet Take 1 tablet by mouth daily.    [provider]  ONETOUCH DELICA LANCETS FINE MISC Check blood sugar up to 1   time a day 12/22/15   Rice, Christopher W, MD    Scheduled Meds: . enoxaparin (LOVENOX) injection  30 mg Subcutaneous Q24H  . feeding supplement  237 mL Oral BID BM   Infusions: . sodium chloride 150 mL/hr at 05/21/20 0054   PRN Meds:    Allergies as of 05/19/2020  . (No Known Allergies)    Family History  Problem Relation Age of Onset  . Diabetes Mother   . Cancer Mother        unkown type  . Hypertension Father   . Colon cancer Neg Hx     Social History   Socioeconomic History  . Marital status: Legally Separated    Spouse name: Not on file  . Number of children: Not on file  . Years of education: Not on file  . Highest education level: Not on file  Occupational History  . Not on file  Tobacco Use  . Smoking status: Former Smoker    Packs/day: 0.12    Years: 49.00    Pack years: 5.88    Types: Cigarettes    Start date: 07/20/2012    Quit date: 02/18/2015    Years since quitting: 5.2  . Smokeless tobacco: Never Used  Vaping Use  . Vaping Use: Never used  Substance and Sexual Activity  . Alcohol use: Not Currently    Alcohol/week: 6.0 standard drinks    Types: 6 Standard drinks or equivalent per week  . Drug use: No  . Sexual activity:  Yes    Partners: Female  Other Topics Concern  . Not on file  Social History Narrative  . Not on file   Social Determinants of Health   Financial Resource Strain:   . Difficulty of Paying Living Expenses: Not on file  Food Insecurity:   . Worried About Running Out of Food in the Last Year: Not on file  . Ran Out of Food in the Last Year: Not on file  Transportation Needs:   . Lack of Transportation (Medical): Not on file  . Lack of Transportation (Non-Medical): Not on file  Physical Activity:   . Days of Exercise per Week: Not on file  . Minutes of Exercise per Session: Not on file  Stress:   . Feeling of Stress : Not on file  Social Connections:   . Frequency of Communication with Friends and Family: Not on file  . Frequency of Social Gatherings with Friends and Family: Not on file  . Attends Religious Services: Not on file  . Active Member of Clubs or Organizations: Not on file  . Attends Club or Organization Meetings: Not on file  . Marital Status: Not on file  Intimate Partner Violence:   . Fear of Current or Ex-Partner: Not on file  . Emotionally Abused: Not on file  . Physically Abused: Not on file  . Sexually Abused: Not on file    REVIEW OF SYSTEMS: Constitutional: Weakness, fatigue ENT:  No nose bleeds Pulm: No shortness of breath, no cough CV:  No palpitations, no LE edema.  GU:  No hematuria, no frequency GI: See HPI Heme: As unusual or excessive bleeding or bruising. Transfusions: Patient recalls no previous blood product transfusion. Neuro: Unsteady gait.  No tremors, no seizures, no syncope no headaches, no peripheral tingling or numbness Derm:  No itching, no rash or sores.  Endocrine:  No sweats or chills.  No polyuria or dysuria Immunization: Vaccinated for COVID-19 x3    PHYSICAL EXAM: Vital signs   in last 24 hours: Vitals:   05/21/20 0032 05/21/20 0559  BP: 137/80 118/66  Pulse: 96 82  Resp: 17 17  Temp: 98.8 F (37.1 C) 98.3 F (36.8 C)   SpO2:     Wt Readings from Last 3 Encounters:  05/21/20 68.3 kg  05/19/20 67 kg  04/19/20 74.1 kg    General: Patient looks well, comfortable.  Good weight, not cachectic. Head: No facial asymmetry or swelling.  No signs of head trauma. Eyes: No scleral icterus or conjunctival pallor. Ears: Not hard of hearing Nose: No congestion or discharge Mouth: Oropharynx moist, pink, clear.  Tongue midline.  Fair dentition. Neck: No JVD, no masses, no thyromegaly. Lungs: Clear bilaterally with good breath sounds.  No cough Heart: RRR.  No MRG.  S1, S2 present. Abdomen: Soft.  Extensive gauze bandage not removed covers site of percutaneous drain insertion.  Bandage C/D.  There is minimal if any tenderness in the region of the right upper quadrant.  There is fullness without distinct mass in the right upper quadrant.  Bowel sounds present, somewhat hypoactive Rectal: Deferred Musc/Skeltl: No joint redness, swelling or gross deformity Extremities: No CCE Neurologic: Alert.  Appropriate.  Oriented x3.  Moves all 4 limbs without tremor, strength not tested. Skin: Jaundiced though this is somewhat masked by his baseline skin coloring. Nodes: No cervical adenopathy Psych: Man of few words but appropriate responses to questions, cooperative.  Intake/Output from previous day: 12/02 0701 - 12/03 0700 In: 1354.9 [I.V.:355.9; IV Piggyback:999] Out: 450 [Urine:400; Drains:50] Intake/Output this shift: No intake/output data recorded.  LAB RESULTS: Recent Labs    05/19/20 1030 05/20/20 0338 05/21/20 0613  WBC 13.7* 12.9* 13.3*  HGB 11.5* 10.3* 10.7*  HCT 33.1* 28.4* 31.2*  PLT 807* 598* 683*   BMET Lab Results  Component Value Date   NA 130 (L) 05/21/2020   NA 124 (L) 05/20/2020   NA 126 (L) 05/20/2020   K 4.2 05/21/2020   K 4.6 05/20/2020   K 4.9 05/20/2020   CL 101 05/21/2020   CL 100 05/20/2020   CL 96 (L) 05/20/2020   CO2 18 (L) 05/21/2020   CO2 16 (L) 05/20/2020   CO2 18 (L)  05/20/2020   GLUCOSE 112 (H) 05/21/2020   GLUCOSE 124 (H) 05/20/2020   GLUCOSE 155 (H) 05/20/2020   BUN 23 05/21/2020   BUN 40 (H) 05/20/2020   BUN 40 (H) 05/20/2020   CREATININE 1.68 (H) 05/21/2020   CREATININE 2.37 (H) 05/20/2020   CREATININE 2.55 (H) 05/20/2020   CALCIUM 11.2 (H) 05/21/2020   CALCIUM 11.1 (H) 05/20/2020   CALCIUM 12.0 (H) 05/20/2020   LFT Recent Labs    05/20/20 0338 05/20/20 0743 05/21/20 0613  PROT 6.6 6.7 7.0  ALBUMIN 1.7* 1.8* 1.8*  AST 169* 170* 163*  ALT 118* 115* 114*  ALKPHOS 1,383* 1,364* 1,509*  BILITOT 9.8* 9.9* 10.5*   PT/INR Lab Results  Component Value Date   INR 1.3 (H) 05/19/2020   INR 1.2 03/07/2020   INR 1.0 03/06/2020   Hepatitis Panel No results for input(s): HEPBSAG, HCVAB, HEPAIGM, HEPBIGM in the last 72 hours. C-Diff No components found for: CDIFF Lipase     Component Value Date/Time   LIPASE 34 03/05/2020 1457    Drugs of Abuse  No results found for: LABOPIA, COCAINSCRNUR, LABBENZ, AMPHETMU, THCU, LABBARB   RADIOLOGY STUDIES: MR ABDOMEN MRCP W WO CONTAST  Result Date: 05/20/2020 CLINICAL DATA:  Painless jaundice in a 59-year-old male EXAM: MRI   ABDOMEN WITHOUT AND WITH CONTRAST (INCLUDING MRCP) TECHNIQUE: Multiplanar multisequence MR imaging of the abdomen was performed both before and after the administration of intravenous contrast. Heavily T2-weighted images of the biliary and pancreatic ducts were obtained, and three-dimensional MRCP images were rendered by post processing. CONTRAST:  6.5mL GADAVIST GADOBUTROL 1 MMOL/ML IV SOLN COMPARISON:  CT abdomen and pelvis from September of 2021 FINDINGS: Lower chest: Lung bases are clear, no consolidation or sign of pleural effusion. Limited assessment of lung bases on MRI. Hepatobiliary: Masslike appearance of the liver and a portion of the gallbladder. Obstruction of the biliary tree at the porta hepatis. Is soft tissue with enhancement extends to the porta hepatis obstructing  the biliary tree. And irregular masslike area with irregular peripheral enhancement extends from the gallbladder fossa into the porta hepatis where the bile duct shows transition. Biliary drainage catheter remains in place. This area measures 7.9 x 6.7 cm in greatest axial dimension with another area in hepatic subsegment IV that measures 2.9 x 2.3 cm. Enhancement along the margin posteriorly with soft tissue measuring 3.5 x 2.3 cm on image 46 of series 30. Profound biliary duct distension. T1 signal with mild mixed signal in the area and nonenhancing central portion of this large area extending into the central liver. Portal vein is narrowed at the biliary confluence. This is near the site of biliary duct transition no additional hepatic lesion. Frank enhancing soft tissue just above the pancreatic head is seen on image 48 of series 32 Pancreas:  No sign of pancreatic inflammation or ductal dilation. Spleen:  Normal Adrenals/Urinary Tract: Adrenal glands are normal. Kidneys with smooth contours. No hydronephrosis. Stomach/Bowel: Gastrointestinal tract without acute process. No acute gastrointestinal process to the extent evaluated. Limited assessment on MRI. Vascular/Lymphatic: Normal caliber abdominal aorta. There is no gastrohepatic or hepatoduodenal ligament lymphadenopathy. No retroperitoneal or mesenteric lymphadenopathy. Other:  Small volume ascites about the liver. Musculoskeletal: No acute musculoskeletal process. No destructive bone finding on limited assessment. IMPRESSION: 1. Masslike area in the central liver obstructing bile ducts, common hepatic duct and narrowing the portal vein extending from the gallbladder fossa a highly suspicious for aggressive biliary neoplasm/cholangiocarcinoma/gallbladder carcinoma. Superimposed infection is also possible. 2. Pancreas is largely normal but inseparable from enhancing soft tissue that extends from the porta hepatis inferiorly and with irregular enhancement that  surrounds an area of presumed necrotic tumor and or infected necrotic debris. 3. Separate smaller area showing heterogeneous enhancement likely reflects additional site of tumor in the liver in the inferior medial LEFT hepatic lobe. 4. Small volume ascites about the liver. 5. Biliary drainage tube appears to remain in place, not well evaluated except on MRCP sequences were a can be faintly seen. Electronically Signed   By: Geoffrey  Wile M.D.   On: 05/20/2020 17:55     IMPRESSION:   *    Jaundice.  MR studies showing obstructing mass in the central liver impacting biliary tree and portal vein.  Suspicious for biliary versus cholangio versus gallbladder carcinoma.  *    Acalculus cholecystitis, percutaneous drain placed during mid September 2021 admission.  Drain was exchanged on 11/4 but remains in place.    *   Eradicated hepatitis C  *    HIV, well controlled.  Follows with Dr. Hatcher.    PLAN:     *   Dr. Mansouraty likely to pursue EUS w FNA await his and DR Ciriglianos opinion.  *   Ok to feed pt now.  Check CA 19-9.       Sarah Gribbin  05/21/2020, 10:59 AM Phone [phone]     

## 2020-05-21 NOTE — Progress Notes (Signed)
Internal Medicine Clinic Attending  Case discussed with Dr. Agyei  At the time of the visit.  We reviewed the resident's history and exam and pertinent patient test results.  I agree with the assessment, diagnosis, and plan of care documented in the resident's note.  

## 2020-05-21 NOTE — Consult Note (Signed)
                                                                           [facility] Consult: 10:59 AM 05/21/2020  LOS: 2 days    Referring Provider: Dr Hoffman  Primary Care Physician:  Agyei, Obed K, MD Primary Gastroenterologist:  Dr. Danis     Reason for Consultation:  LIver and pancreatic pathology on MRCP   HPI: John Dickerson is a 59 y.o. male.  PMH HIV, well-controlled on meds.  Hepatitis C, antiviral treatments in 2017..  IDDM.  Hypertension.  Hyperlipidemia.  Anemia.  CKD.  CAD with cardiac stents in 1990s and 2017.  Plavix discontinued in 02/2020. 05/2019 colonoscopy.  Screening initial study.  Entirely normal study to the IC valve.  03/05/2020 - 03/09/2020 admission for acalculous cholecystitis and AKI.  Percutaneous drain placed and remains in place and followed by general surgery and IR.  Cholangiogram via tube 11/2 showed appropriately positioned and functioning cholecystostomy tube without cholelithiasis or choledocholithiasis.  On 04/22/2020 Dr. Schick exchanged the cholecystostomy tube.   Abdominal pain and appetite improved since discharge.  However reports about a 40 pound weight loss since early September along with weakness, unsteady gait, stumbling which is why he sought medical attention and was admitted 12/1.  On exam he was jaundiced. T bili 12.  Alk phos 1268, AST/ALT 216/149.  Since admission, T bili, transaminases slightly improved but alk phos is rising. Hyponatremic to 124, 130 today.  Was as low as 131 during recent admission. AKI, improved. WBCs 13.7.  Hb 10.7.  Platelets 680 INR 1.3 Hep C core antibody IgM was negative and HCV virus antibody less than 0.1 on 9/115  MRI/MRCP:   Mass in central liver causing ductal obstruction including CHD.  Mass causing narrowing of portal vein.  Finding suspicious for aggressive biliary  neoplasm/cholangiocarcinoma/GB carcinoma.  Possible superimposed infection.  Pancreas normal but in separable from an enhancing soft tissue that extends from porta to the area of necrotic tumor and or infected necrotic debris.  Smaller area of heterogeneous enhancement likely reflects additional tumor in the inferior medial left hepatic lobe.  Small volume perihepatic ascites.  Biliary drain tube in position but not well visualized. In comparison his CTAP without contrast of 03/05/2020 showed acute cholelithiasis without any pathology in the pancreas or liver parenchyma.  02/2020 ultrasound showed normal liver echo without focal lesion or biliary dilatation, patent portal vein with normal directional flow.  Patient does not drink alcohol.    Past Medical History:  Diagnosis Date  . Anemia   . Anemia due to medication 09/24/2015  . Arthritis    "feet" (11/19/2015)  . CAD (coronary artery disease), native coronary artery    a. remote LAD stent 1990s. b. s/p DESx2 to LAD 11/2015  . Hepatitis A immune 11/20/2016  . Hepatitis B immune 11/20/2016  . Hepatitis C    "going thru the treatments" (11/19/2015)  . High cholesterol   . History of gout    "just recently" (11/19/2015)  . HIV (human immunodeficiency virus infection) (HCC)   . Hypertension   . Posterior pain of left hip 03/01/2018  . Type II diabetes mellitus (HCC)    diagnosed   04-2015    Past Surgical History:  Procedure Laterality Date  . CARDIAC CATHETERIZATION N/A 11/19/2015   Procedure: Left Heart Cath and Coronary Angiography;  Surgeon: Daniel R Bensimhon, MD;  Location: [facility] INVASIVE [facility];  Service: Cardiovascular;  Laterality: N/A;  . CARDIAC CATHETERIZATION N/A 11/19/2015   Procedure: Coronary Stent Intervention;  Surgeon: Jayadeep S Varanasi, MD;  Location: [facility]VAS[facility] CV [facility];  Service: Cardiovascular;  Laterality: N/A;  . CORONARY ANGIOPLASTY WITH STENT PLACEMENT  11/19/2015   "2 stents"  . IR CHOLANGIOGRAM EXISTING TUBE  03/23/2020  . IR  EXCHANGE BILIARY DRAIN  04/22/2020  . IR PERC CHOLECYSTOSTOMY  03/06/2020  . IR RADIOLOGIST EVAL & MGMT  04/06/2020  . IR RADIOLOGIST EVAL & MGMT  04/20/2020  . other     unknown abdominal surgery in childhood    Prior to Admission medications   Medication Sig Start Date End Date Taking? Authorizing Provider  allopurinol (ZYLOPRIM) 100 MG tablet TAKE 1 AND 1/2 TABLETS(150 MG) BY MOUTH DAILY Patient taking differently: Take 150 mg by mouth daily.  07/28/19  Yes Agyei, Obed K, MD  amLODipine (NORVASC) 10 MG tablet Take 1 tablet (10 mg total) by mouth daily. 11/03/19  Yes Vincent, Duncan Thomas, MD  aspirin EC 81 MG tablet Take 1 tablet (81 mg total) by mouth daily. 11/17/15  Yes Bensimhon, Daniel R, MD  atorvastatin (LIPITOR) 40 MG tablet TAKE 1 TABLET BY MOUTH DAILY AT 6PM Patient taking differently: Take 40 mg by mouth daily.  05/18/20  Yes Varanasi, Jayadeep S, MD  BIKTARVY 50-200-25 MG TABS tablet TAKE 1 TABLET BY MOUTH DAILY Patient taking differently: Take 1 tablet by mouth daily.  05/17/20  Yes Hatcher, Jeffrey C, MD  losartan (COZAAR) 50 MG tablet Take 1 tablet (50 mg total) by mouth daily. 03/30/20  Yes Lee, Joshua K, MD  metFORMIN (GLUCOPHAGE) 1000 MG tablet TAKE 1 TABLET BY MOUTH TWICE DAILY WITH A MEAL Patient taking differently: Take 1,000 mg by mouth 2 (two) times daily with a meal.  12/01/19  Yes Agyei, Obed K, MD  metoprolol succinate (TOPROL-XL) 100 MG 24 hr tablet TAKE 1 TABLET BY MOUTH DAILY WITH OR IMMEDAITELY FOLLOWING A MEAL Patient taking differently: Take 100 mg by mouth daily.  10/21/19  Yes Agyei, Obed K, MD  nitroGLYCERIN (NITROSTAT) 0.4 MG SL tablet Place 1 tablet (0.4 mg total) under the tongue every 5 (five) minutes as needed for chest pain. 11/17/15  Yes Bensimhon, Daniel R, MD  blood glucose meter kit and supplies KIT Dispense based on patient and insurance preference. Use up to four times daily as directed. (FOR ICD-9 250.00, 250.01). 08/03/15   Emokpae, Ejiroghene E, MD   Blood Glucose Monitoring Suppl (ONETOUCH VERIO FLEX SYSTEM) w/Device KIT 1 each by Does not apply route daily. 12/22/15   Rice, Christopher W, MD  Continuous Blood Gluc Receiver (FREESTYLE LIBRE 2 READER SYSTM) DEVI 1 Device by Does not apply route 3 (three) times daily. 01/08/19   Agyei, Obed K, MD  Continuous Blood Gluc Sensor (FREESTYLE LIBRE 2 SENSOR SYSTM) MISC 1 Device by Does not apply route 3 (three) times daily. 01/08/19   Agyei, Obed K, MD  glucose blood (ONETOUCH VERIO) test strip Check blood sugar up to 1 time a day 12/22/15   Rice, Christopher W, MD  Multiple Vitamin (MULTIVITAMIN WITH MINERALS) TABS tablet Take 1 tablet by mouth daily.    [provider]  ONETOUCH DELICA LANCETS FINE MISC Check blood sugar up to 1   time a day 12/22/15   Rice, Christopher W, MD    Scheduled Meds: . enoxaparin (LOVENOX) injection  30 mg Subcutaneous Q24H  . feeding supplement  237 mL Oral BID BM   Infusions: . sodium chloride 150 mL/hr at 05/21/20 0054   PRN Meds:    Allergies as of 05/19/2020  . (No Known Allergies)    Family History  Problem Relation Age of Onset  . Diabetes Mother   . Cancer Mother        unkown type  . Hypertension Father   . Colon cancer Neg Hx     Social History   Socioeconomic History  . Marital status: Legally Separated    Spouse name: Not on file  . Number of children: Not on file  . Years of education: Not on file  . Highest education level: Not on file  Occupational History  . Not on file  Tobacco Use  . Smoking status: Former Smoker    Packs/day: 0.12    Years: 49.00    Pack years: 5.88    Types: Cigarettes    Start date: 07/20/2012    Quit date: 02/18/2015    Years since quitting: 5.2  . Smokeless tobacco: Never Used  Vaping Use  . Vaping Use: Never used  Substance and Sexual Activity  . Alcohol use: Not Currently    Alcohol/week: 6.0 standard drinks    Types: 6 Standard drinks or equivalent per week  . Drug use: No  . Sexual activity:  Yes    Partners: Female  Other Topics Concern  . Not on file  Social History Narrative  . Not on file   Social Determinants of Health   Financial Resource Strain:   . Difficulty of Paying Living Expenses: Not on file  Food Insecurity:   . Worried About Running Out of Food in the Last Year: Not on file  . Ran Out of Food in the Last Year: Not on file  Transportation Needs:   . Lack of Transportation (Medical): Not on file  . Lack of Transportation (Non-Medical): Not on file  Physical Activity:   . Days of Exercise per Week: Not on file  . Minutes of Exercise per Session: Not on file  Stress:   . Feeling of Stress : Not on file  Social Connections:   . Frequency of Communication with Friends and Family: Not on file  . Frequency of Social Gatherings with Friends and Family: Not on file  . Attends Religious Services: Not on file  . Active Member of Clubs or Organizations: Not on file  . Attends Club or Organization Meetings: Not on file  . Marital Status: Not on file  Intimate Partner Violence:   . Fear of Current or Ex-Partner: Not on file  . Emotionally Abused: Not on file  . Physically Abused: Not on file  . Sexually Abused: Not on file    REVIEW OF SYSTEMS: Constitutional: Weakness, fatigue ENT:  No nose bleeds Pulm: No shortness of breath, no cough CV:  No palpitations, no LE edema.  GU:  No hematuria, no frequency GI: See HPI Heme: As unusual or excessive bleeding or bruising. Transfusions: Patient recalls no previous blood product transfusion. Neuro: Unsteady gait.  No tremors, no seizures, no syncope no headaches, no peripheral tingling or numbness Derm:  No itching, no rash or sores.  Endocrine:  No sweats or chills.  No polyuria or dysuria Immunization: Vaccinated for COVID-19 x3    PHYSICAL EXAM: Vital signs   in last 24 hours: Vitals:   05/21/20 0032 05/21/20 0559  BP: 137/80 118/66  Pulse: 96 82  Resp: 17 17  Temp: 98.8 F (37.1 C) 98.3 F (36.8 C)   SpO2:     Wt Readings from Last 3 Encounters:  05/21/20 68.3 kg  05/19/20 67 kg  04/19/20 74.1 kg    General: Patient looks well, comfortable.  Good weight, not cachectic. Head: No facial asymmetry or swelling.  No signs of head trauma. Eyes: No scleral icterus or conjunctival pallor. Ears: Not hard of hearing Nose: No congestion or discharge Mouth: Oropharynx moist, pink, clear.  Tongue midline.  Fair dentition. Neck: No JVD, no masses, no thyromegaly. Lungs: Clear bilaterally with good breath sounds.  No cough Heart: RRR.  No MRG.  S1, S2 present. Abdomen: Soft.  Extensive gauze bandage not removed covers site of percutaneous drain insertion.  Bandage C/D.  There is minimal if any tenderness in the region of the right upper quadrant.  There is fullness without distinct mass in the right upper quadrant.  Bowel sounds present, somewhat hypoactive Rectal: Deferred Musc/Skeltl: No joint redness, swelling or gross deformity Extremities: No CCE Neurologic: Alert.  Appropriate.  Oriented x3.  Moves all 4 limbs without tremor, strength not tested. Skin: Jaundiced though this is somewhat masked by his baseline skin coloring. Nodes: No cervical adenopathy Psych: Man of few words but appropriate responses to questions, cooperative.  Intake/Output from previous day: 12/02 0701 - 12/03 0700 In: 1354.9 [I.V.:355.9; IV Piggyback:999] Out: 450 [Urine:400; Drains:50] Intake/Output this shift: No intake/output data recorded.  LAB RESULTS: Recent Labs    05/19/20 1030 05/20/20 0338 05/21/20 0613  WBC 13.7* 12.9* 13.3*  HGB 11.5* 10.3* 10.7*  HCT 33.1* 28.4* 31.2*  PLT 807* 598* 683*   BMET Lab Results  Component Value Date   NA 130 (L) 05/21/2020   NA 124 (L) 05/20/2020   NA 126 (L) 05/20/2020   K 4.2 05/21/2020   K 4.6 05/20/2020   K 4.9 05/20/2020   CL 101 05/21/2020   CL 100 05/20/2020   CL 96 (L) 05/20/2020   CO2 18 (L) 05/21/2020   CO2 16 (L) 05/20/2020   CO2 18 (L)  05/20/2020   GLUCOSE 112 (H) 05/21/2020   GLUCOSE 124 (H) 05/20/2020   GLUCOSE 155 (H) 05/20/2020   BUN 23 05/21/2020   BUN 40 (H) 05/20/2020   BUN 40 (H) 05/20/2020   CREATININE 1.68 (H) 05/21/2020   CREATININE 2.37 (H) 05/20/2020   CREATININE 2.55 (H) 05/20/2020   CALCIUM 11.2 (H) 05/21/2020   CALCIUM 11.1 (H) 05/20/2020   CALCIUM 12.0 (H) 05/20/2020   LFT Recent Labs    05/20/20 0338 05/20/20 0743 05/21/20 0613  PROT 6.6 6.7 7.0  ALBUMIN 1.7* 1.8* 1.8*  AST 169* 170* 163*  ALT 118* 115* 114*  ALKPHOS 1,383* 1,364* 1,509*  BILITOT 9.8* 9.9* 10.5*   PT/INR Lab Results  Component Value Date   INR 1.3 (H) 05/19/2020   INR 1.2 03/07/2020   INR 1.0 03/06/2020   Hepatitis Panel No results for input(s): HEPBSAG, HCVAB, HEPAIGM, HEPBIGM in the last 72 hours. C-Diff No components found for: CDIFF Lipase     Component Value Date/Time   LIPASE 34 03/05/2020 1457    Drugs of Abuse  No results found for: LABOPIA, COCAINSCRNUR, LABBENZ, AMPHETMU, THCU, LABBARB   RADIOLOGY STUDIES: MR ABDOMEN MRCP W WO CONTAST  Result Date: 05/20/2020 CLINICAL DATA:  Painless jaundice in a 59-year-old male EXAM: MRI   ABDOMEN WITHOUT AND WITH CONTRAST (INCLUDING MRCP) TECHNIQUE: Multiplanar multisequence MR imaging of the abdomen was performed both before and after the administration of intravenous contrast. Heavily T2-weighted images of the biliary and pancreatic ducts were obtained, and three-dimensional MRCP images were rendered by post processing. CONTRAST:  6.5mL GADAVIST GADOBUTROL 1 MMOL/ML IV SOLN COMPARISON:  CT abdomen and pelvis from September of 2021 FINDINGS: Lower chest: Lung bases are clear, no consolidation or sign of pleural effusion. Limited assessment of lung bases on MRI. Hepatobiliary: Masslike appearance of the liver and a portion of the gallbladder. Obstruction of the biliary tree at the porta hepatis. Is soft tissue with enhancement extends to the porta hepatis obstructing  the biliary tree. And irregular masslike area with irregular peripheral enhancement extends from the gallbladder fossa into the porta hepatis where the bile duct shows transition. Biliary drainage catheter remains in place. This area measures 7.9 x 6.7 cm in greatest axial dimension with another area in hepatic subsegment IV that measures 2.9 x 2.3 cm. Enhancement along the margin posteriorly with soft tissue measuring 3.5 x 2.3 cm on image 46 of series 30. Profound biliary duct distension. T1 signal with mild mixed signal in the area and nonenhancing central portion of this large area extending into the central liver. Portal vein is narrowed at the biliary confluence. This is near the site of biliary duct transition no additional hepatic lesion. Frank enhancing soft tissue just above the pancreatic head is seen on image 48 of series 32 Pancreas:  No sign of pancreatic inflammation or ductal dilation. Spleen:  Normal Adrenals/Urinary Tract: Adrenal glands are normal. Kidneys with smooth contours. No hydronephrosis. Stomach/Bowel: Gastrointestinal tract without acute process. No acute gastrointestinal process to the extent evaluated. Limited assessment on MRI. Vascular/Lymphatic: Normal caliber abdominal aorta. There is no gastrohepatic or hepatoduodenal ligament lymphadenopathy. No retroperitoneal or mesenteric lymphadenopathy. Other:  Small volume ascites about the liver. Musculoskeletal: No acute musculoskeletal process. No destructive bone finding on limited assessment. IMPRESSION: 1. Masslike area in the central liver obstructing bile ducts, common hepatic duct and narrowing the portal vein extending from the gallbladder fossa a highly suspicious for aggressive biliary neoplasm/cholangiocarcinoma/gallbladder carcinoma. Superimposed infection is also possible. 2. Pancreas is largely normal but inseparable from enhancing soft tissue that extends from the porta hepatis inferiorly and with irregular enhancement that  surrounds an area of presumed necrotic tumor and or infected necrotic debris. 3. Separate smaller area showing heterogeneous enhancement likely reflects additional site of tumor in the liver in the inferior medial LEFT hepatic lobe. 4. Small volume ascites about the liver. 5. Biliary drainage tube appears to remain in place, not well evaluated except on MRCP sequences were a can be faintly seen. Electronically Signed   By: Geoffrey  Wile M.D.   On: 05/20/2020 17:55     IMPRESSION:   *    Jaundice.  MR studies showing obstructing mass in the central liver impacting biliary tree and portal vein.  Suspicious for biliary versus cholangio versus gallbladder carcinoma.  *    Acalculus cholecystitis, percutaneous drain placed during mid September 2021 admission.  Drain was exchanged on 11/4 but remains in place.    *   Eradicated hepatitis C  *    HIV, well controlled.  Follows with Dr. Hatcher.    PLAN:     *   Dr. Mansouraty likely to pursue EUS w FNA await his and DR Ciriglianos opinion.  *   Ok to feed pt now.  Check CA 19-9.       Dantre Yearwood  05/21/2020, 10:59 AM Phone [phone]     

## 2020-05-22 ENCOUNTER — Inpatient Hospital Stay (HOSPITAL_COMMUNITY): Payer: Medicare HMO | Admitting: Certified Registered"

## 2020-05-22 ENCOUNTER — Encounter (HOSPITAL_COMMUNITY)
Admission: AD | Disposition: A | Discharge status: Home / Self Care | Payer: Self-pay | Source: Home / Self Care | Attending: Internal Medicine

## 2020-05-22 ENCOUNTER — Encounter (HOSPITAL_COMMUNITY): Payer: Self-pay | Admitting: Internal Medicine

## 2020-05-22 ENCOUNTER — Other Ambulatory Visit: Payer: Self-pay

## 2020-05-22 ENCOUNTER — Inpatient Hospital Stay (HOSPITAL_COMMUNITY): Payer: Medicare HMO

## 2020-05-22 DIAGNOSIS — D490 Neoplasm of unspecified behavior of digestive system: Secondary | ICD-10-CM

## 2020-05-22 DIAGNOSIS — K209 Esophagitis, unspecified without bleeding: Secondary | ICD-10-CM

## 2020-05-22 HISTORY — PX: BILIARY DILATION: SHX6850

## 2020-05-22 HISTORY — PX: ENDOSCOPIC RETROGRADE CHOLANGIOPANCREATOGRAPHY (ERCP) WITH PROPOFOL: SHX5810

## 2020-05-22 HISTORY — PX: REMOVAL OF STONES: SHX5545

## 2020-05-22 HISTORY — PX: ESOPHAGOGASTRODUODENOSCOPY (EGD) WITH PROPOFOL: SHX5813

## 2020-05-22 HISTORY — PX: SPHINCTEROTOMY: SHX5544

## 2020-05-22 HISTORY — PX: BIOPSY: SHX5522

## 2020-05-22 HISTORY — PX: BILIARY BRUSHING: SHX6843

## 2020-05-22 HISTORY — PX: UPPER ESOPHAGEAL ENDOSCOPIC ULTRASOUND (EUS): SHX6562

## 2020-05-22 HISTORY — PX: BILIARY STENT PLACEMENT: SHX5538

## 2020-05-22 LAB — COMPREHENSIVE METABOLIC PANEL
ALT: 96 U/L — ABNORMAL HIGH (ref 0–44)
AST: 126 U/L — ABNORMAL HIGH (ref 15–41)
Albumin: 1.7 g/dL — ABNORMAL LOW (ref 3.5–5.0)
Alkaline Phosphatase: 1266 U/L — ABNORMAL HIGH (ref 38–126)
Anion gap: 11 (ref 5–15)
BUN: 17 mg/dL (ref 8–23)
CO2: 16 mmol/L — ABNORMAL LOW (ref 22–32)
Calcium: 10.7 mg/dL — ABNORMAL HIGH (ref 8.9–10.3)
Chloride: 103 mmol/L (ref 98–111)
Creatinine, Ser: 1.17 mg/dL (ref 0.61–1.24)
GFR, Estimated: >60 mL/min (ref >60)
Glucose, Bld: 111 mg/dL — ABNORMAL HIGH (ref 70–99)
Potassium: 3.5 mmol/L (ref 3.5–5.1)
Sodium: 130 mmol/L — ABNORMAL LOW (ref 135–145)
Total Bilirubin: 9.7 mg/dL — ABNORMAL HIGH (ref 0.3–1.2)
Total Protein: 6.3 g/dL — ABNORMAL LOW (ref 6.5–8.1)

## 2020-05-22 LAB — CBC
HCT: 28.3 % — ABNORMAL LOW (ref 39.0–52.0)
Hemoglobin: 9.7 g/dL — ABNORMAL LOW (ref 13.0–17.0)
MCH: 29.3 pg (ref 26.0–34.0)
MCHC: 34.3 g/dL (ref 30.0–36.0)
MCV: 85.5 fL (ref 80.0–100.0)
Platelets: 575 10*3/uL — ABNORMAL HIGH (ref 150–400)
RBC: 3.31 MIL/uL — ABNORMAL LOW (ref 4.22–5.81)
RDW: 19.9 % — ABNORMAL HIGH (ref 11.5–15.5)
WBC: 15 10*3/uL — ABNORMAL HIGH (ref 4.0–10.5)
nRBC: 0 % (ref 0.0–0.2)

## 2020-05-22 LAB — CANCER ANTIGEN 19-9: CA 19-9: 753 U/mL — ABNORMAL HIGH (ref 0–35)

## 2020-05-22 SURGERY — UPPER ESOPHAGEAL ENDOSCOPIC ULTRASOUND (EUS)
Anesthesia: General

## 2020-05-22 MED ORDER — PROPOFOL 10 MG/ML IV BOLUS
INTRAVENOUS | Status: DC | PRN
  Administered 2020-05-22: 200 mg via INTRAVENOUS

## 2020-05-22 MED ORDER — GLUCAGON HCL RDNA (DIAGNOSTIC) 1 MG IJ SOLR
INTRAMUSCULAR | Status: AC
  Filled 2020-05-22: qty 2

## 2020-05-22 MED ORDER — ROCURONIUM BROMIDE 10 MG/ML (PF) SYRINGE
PREFILLED_SYRINGE | INTRAVENOUS | Status: DC | PRN
  Administered 2020-05-22: 60 mg via INTRAVENOUS

## 2020-05-22 MED ORDER — ESMOLOL HCL 100 MG/10ML IV SOLN
INTRAVENOUS | Status: DC | PRN
  Administered 2020-05-22: 20 mg via INTRAVENOUS

## 2020-05-22 MED ORDER — CIPROFLOXACIN IN D5W 400 MG/200ML IV SOLN
INTRAVENOUS | Status: AC
  Filled 2020-05-22: qty 200

## 2020-05-22 MED ORDER — OXYCODONE HCL 5 MG/5ML PO SOLN: 5.0000 mg | Freq: Once | ORAL | Status: DC | PRN

## 2020-05-22 MED ORDER — INDOMETHACIN 50 MG RE SUPP
RECTAL | Status: DC | PRN
  Administered 2020-05-22: 100 mg via RECTAL

## 2020-05-22 MED ORDER — PHENYLEPHRINE HCL-NACL 10-0.9 MG/250ML-% IV SOLN
INTRAVENOUS | Status: DC | PRN
  Administered 2020-05-22: 40 ug/min via INTRAVENOUS

## 2020-05-22 MED ORDER — INDOMETHACIN 50 MG RE SUPP
RECTAL | Status: AC
  Filled 2020-05-22: qty 2

## 2020-05-22 MED ORDER — ONDANSETRON HCL 4 MG/2ML IJ SOLN
INTRAMUSCULAR | Status: DC | PRN
  Administered 2020-05-22: 4 mg via INTRAVENOUS

## 2020-05-22 MED ORDER — GLUCAGON HCL RDNA (DIAGNOSTIC) 1 MG IJ SOLR
INTRAMUSCULAR | Status: DC | PRN
  Administered 2020-05-22 (×4): .25 mg via INTRAVENOUS

## 2020-05-22 MED ORDER — CIPROFLOXACIN IN D5W 400 MG/200ML IV SOLN
INTRAVENOUS | Status: DC | PRN
  Administered 2020-05-22: 400 mg via INTRAVENOUS

## 2020-05-22 MED ORDER — MIDAZOLAM HCL 5 MG/5ML IJ SOLN
INTRAMUSCULAR | Status: DC | PRN
  Administered 2020-05-22: 2 mg via INTRAVENOUS

## 2020-05-22 MED ORDER — INDOMETHACIN 50 MG RE SUPP
100.0000 mg | Freq: Once | RECTAL | Status: DC
  Filled 2020-05-22: qty 2

## 2020-05-22 MED ORDER — PROMETHAZINE HCL 25 MG/ML IJ SOLN: 6.2500 mg | INTRAMUSCULAR | Status: DC | PRN

## 2020-05-22 MED ORDER — FENTANYL CITRATE (PF) 250 MCG/5ML IJ SOLN: INTRAMUSCULAR | Status: DC | PRN

## 2020-05-22 MED ORDER — MIDAZOLAM HCL 2 MG/2ML IJ SOLN
INTRAMUSCULAR | Status: AC
  Filled 2020-05-22: qty 2

## 2020-05-22 MED ORDER — FENTANYL CITRATE (PF) 100 MCG/2ML IJ SOLN: 25.0000 ug | INTRAMUSCULAR | Status: DC | PRN

## 2020-05-22 MED ORDER — FENTANYL CITRATE (PF) 100 MCG/2ML IJ SOLN
INTRAMUSCULAR | Status: DC | PRN
Start: 2020-05-22 — End: 2020-05-22
  Administered 2020-05-22: 100 ug via INTRAVENOUS

## 2020-05-22 MED ORDER — PHENYLEPHRINE 40 MCG/ML (10ML) SYRINGE FOR IV PUSH (FOR BLOOD PRESSURE SUPPORT)
PREFILLED_SYRINGE | INTRAVENOUS | Status: DC | PRN
  Administered 2020-05-22 (×3): 80 ug via INTRAVENOUS

## 2020-05-22 MED ORDER — LIDOCAINE 2% (20 MG/ML) 5 ML SYRINGE
INTRAMUSCULAR | Status: DC | PRN
  Administered 2020-05-22: 60 mg via INTRAVENOUS

## 2020-05-22 MED ORDER — DEXAMETHASONE SODIUM PHOSPHATE 10 MG/ML IJ SOLN
INTRAMUSCULAR | Status: DC | PRN
  Administered 2020-05-22: 5 mg via INTRAVENOUS

## 2020-05-22 MED ORDER — OXYCODONE HCL 5 MG PO TABS: 5.0000 mg | ORAL_TABLET | Freq: Once | ORAL | Status: DC | PRN

## 2020-05-22 MED ORDER — SODIUM CHLORIDE 0.9 % IV SOLN
INTRAVENOUS | Status: DC | PRN
  Administered 2020-05-22: 50 mL

## 2020-05-22 MED ORDER — SODIUM CHLORIDE 0.9 % IV SOLN: 3.0000 g | Freq: Once | INTRAVENOUS | Status: DC

## 2020-05-22 MED ORDER — LACTATED RINGERS IV SOLN: INTRAVENOUS | Status: DC

## 2020-05-22 MED ORDER — SODIUM CHLORIDE 0.9 % IV SOLN: INTRAVENOUS | Status: AC

## 2020-05-22 NOTE — Progress Notes (Signed)
Subjective:   Hospital day: 3  GI saw patient yesterday. They will discuss case with advanced biliary service. Ordered tumor markers and viral labs.  Overnight event: NAEOV.   This AM, patient was laying comfortably in bed. He states he is hungry and was able to eat yesterday. States he feels much better today but did not get a lot of sleep last night. Denies any SOB, N/V, CP, abd pain, headache or dizziness.   Objective:  Vital signs in last 24 hours: Vitals:   05/21/20 0559 05/21/20 1103 05/21/20 2052 05/22/20 0530  BP: 118/66 136/75 120/65 118/71  Pulse: 82  94 96  Resp: 17 18 18 18   Temp: 98.3 F (36.8 C) 98.6 F (37 C) 99.7 F (37.6 C) 99.1 F (37.3 C)  TempSrc: Oral Oral Oral Oral  SpO2:  98% 98% 98%  Weight: 68.3 kg   67.6 kg  Height:        Physical Exam  General: Pleasant, ill-appearing elderly man laying in bed. No acute distress. Head: Normocephalic. Atraumatic. HEENT: Sclera icterus resolved. MMM. CV: RRR. No murmurs, rubs, or gallops. No LE edema Pulmonary: Lungs CTAB. Normal effort. No wheezing or rales. Abdominal: Soft, nontender, nondistended. Normal bowel sounds. Perc cholecystostomy not draining any fluids. Extremities: Palpable pulses. Normal ROM. Skin: Warm and dry. No obvious rash or lesions. Jaundice resolved. Neuro: A&Ox3. Moves all extremities. Normal sensation. No focal deficit. Psych: Normal mood and affect  Assessment/Plan: John Dickerson is a 59 y.o. male with history of well-controlled HIV, HTN, CAD, recent diagnosis of acalculous cholecystitis s/p percutaneous drain placement who presents with progressive weight loss, fatigue, and jaundice and undergoing further work up painless jaundice and hypercalcemia. Found to have a masslike structure in the liver causing duct obstruction and narrowing of the PV.   Principal Problem:   Painless jaundice Active Problems:   Hypercalcemia   Protein-calorie malnutrition, severe   Elevated  liver enzymes   Elevated alkaline phosphatase level   Loss of weight  Painless jaundice  Presented with unintentional weight loss, poor appetite, and jaundice and found to have cholestatic pattern on CMP with significantly elevated alkaline phos. Patient with recent history of acalculous cholecystitis and drain placement to relieved the obstruction 1 month ago. Presentation and lab findings concerning for pancreatic malignancy. Will consider further biliary obstruction on the differential. MRCP shows masslike area in the central liver obstructing the bile ducts, common hepatic duct and narrowing the portal vein at biliary confluence a highly suspicious for aggressive biliary neoplasm/cholangiocarcinoma/gallbladder carcinoma. Patient and wife informed of results. GI consulted and following. Will discuss case with advanced biliary service. Pending further work up of mass. Child-Pugh score of 10 (Class C).  --Consult GI, appreciate recs    --F/u CA 19-9    --F/u CMV, EBV, HSV    --EUS w/ FNA today --Consider oncology referral --Continue IVF resuscitation --Daily CMP --Consider IR consult regarding plan for drain removal --Drain care --Continue diet for now --Holding meds due to liver and kidney dysfunction  Hypercalcemia Patient found to have an elevated calcium of 14.2 in clinic. Started on IVF resuscitation and calcitonin. Calcium levels down to 10.7. Low Vit D levels. Endorse some cognitive slowing at home but denies any bone pains, constipation, N/V or abdominal pain.  --S/p calcitonin 260 units subcut x1 dose --S/p 3 L NS bolus --NS infusion @ 150 cc/hr --Low PTH, calcitriol  --F/u PTHrPep --Daily CMP  HTN Patient found to have soft BP with systolic in the  90s on arrival. S/p 3 L of NS bolus. BP improved to 118/71 this morning. Will monitor closely --Continue IVF as above --Holding home anti-hypertensives --Daily vitals  HIV Chronic and stable. On viral load undetectable on  Biktarvy. Holding [place] for now due to severe hepatic impairment Child-Pugh score of 10 (Class C). --Will follow up with ID doc  Diet: Regular IVF: IVNS VTE: Lovenox 30 mg daily CODE: Full Code  Prior to Admission Living Arrangement: Home Anticipated Discharge Location: Home Barriers to Discharge: Medical workup Dispo: Anticipated discharge in approximately 2-3 day(s).   Signed: Lacinda Axon, MD 05/22/2020, 5:56 AM  Pager: [phone] Internal Medicine Teaching Service After 5pm on weekdays and 1pm on weekends: On Call pager: [phone]

## 2020-05-22 NOTE — Op Note (Signed)
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 distal to the confluence of the right                            and left hepatic ducts), Biliary dilation on                            magnetic resonance cholangiopancreatography,                            Biliary tumor on magnetic resonance                            cholangiopancreatography, Jaundice, Abnormal liver                            function test Providers:                Justice Britain, MD, Glori Bickers, RN, Ladona Ridgel, Technician, Benetta Spar, Technician Referring MD:             Gerrit Heck, MD, Inpatient Medical Service Medicines:                General Anesthesia, Cipro 400 mg IV, Indomethacin                            100 mg PR, Glucagon 1 mg IV Complications:            No immediate complications. Estimated Blood Loss:     Estimated blood loss was minimal. Procedure:                Pre-Anesthesia Assessment:                           - Prior to the procedure, a History and Physical                            was performed, and patient medications and                            allergies were reviewed. The patient's tolerance of                            previous anesthesia was also reviewed. The risks  and benefits of the procedure and the sedation                            options and risks were discussed with the patient.                            All questions were answered, and informed consent                            was obtained. Prior Anticoagulants: The patient has                            taken Lovenox (enoxaparin), last dose  was 1 day                            prior to procedure. ASA Grade Assessment: III - A                            patient with severe systemic disease. After                            reviewing the risks and benefits, the patient was                            deemed in satisfactory condition to undergo the                            procedure.                           After obtaining informed consent, the scope was                            passed under direct vision. Throughout the                            procedure, the patient's blood pressure, pulse, and                            oxygen saturations were monitored continuously. The                            TJF-Q180V ([id number] Olympus Duodenoscope was                            introduced through the mouth, and used to inject                            contrast into and used to inject contrast into the                            bile duct. The ERCP was accomplished without  difficulty. The patient tolerated the procedure. Scope In: Scope Out: Findings:      A scout film of the abdomen was obtained. One percutaneous drain ending       in the Right upper quadrant was seen.      The esophagus was successfully intubated under direct vision without       detailed examination of the pharynx, larynx, and associated structures,       and upper GI tract. The major papilla was normal.      A short 0.035 inch Soft Jagwire was passed into the biliary tree on       first attempt. The Autotome sphincterotome was passed over the guidewire       and the bile duct was then deeply cannulated. I initially tried to       pullback to see if any bile would flow but this was not successful.       Contrast was then injected. I personally interpreted the bile duct       images. Ductal flow of contrast was adequate. Image quality was       adequate. Contrast extended to the hepatic ducts. Opacification of the       entire  biliary tree except for the cystic duct and gallbladder was       successful. The common hepatic duct contained a single significant       stenosis nearly 30 mm in length. The left main hepatic duct and left       intrahepatic branches were severely dilated, with an obstruction as a       result of this narrowing. The largest diameter was 18 mm in the left       hepatic system. An 8 mm biliary sphincterotomy was made with a       monofilament Autotome sphincterotome using ERBE electrocautery. There       was no post-sphincterotomy bleeding. To discover objects, the biliary       tree was swept with a retrieval balloon starting at the bifurcation.       Sludge was swept from the duct. Clots and presumed tissue were swept       from the duct with tissue being suctioned for pathology. An occlusion       cholangiogram was performed that showed no further significant biliary       pathology other than what was noted above. I could not fill the right       system however. Dilation of the common bile duct stenosis with a       Hurricane 4 mm balloon dilator was successful along the length of the       stricture. Cells for cytology were obtained by brushing the stricture.       Due to the dilation of the left system, I wanted to ensure adequate       drainage for this patient. A second short 0.035 inch Soft Jagwire was       passed into the left biliary system. Two transpapillary plastic stents       were placed 8.5 Fr by 12 cm and 8.5 Fr by 9 cm with single external       flaps and a single internal flaps were placed into the left hepatic       duct. Bile and contrast flowed through the stents. The stents were in       good position. I could see a significant decrease in the  contrast that       had been placed after the stents had been placed.      A pancreatogram was not performed.      The duodenoscope was withdrawn from the patient. Impression:               - The major papilla appeared  normal.                           - A single severe biliary stricture was found in                            the common hepatic duct. The stricture was                            indeterminate. This was eventually dilated. This                            was brushed for cytology purposes.                           - The left main hepatic duct and left intrahepatic                            branches were severely dilated, with an obstruction                            due to the aforementioned stenosis.                           - The right hepatic duct system could not be                            visualized.                           - The gallbladder and cystic duct was not                            visualized.                           - A biliary sphincterotomy was performed.                           - The biliary tree was swept and sludge and clots                            and presumed tissue were found. Tissue/clots sent                            to pathology for review.                           - Two plastic stents were placed into the left  hepatic duct system with good drainage obtained. Recommendation:           - The patient will be observed post-procedure,                            until all discharge criteria are met.                           - Return patient to hospital ward for ongoing care.                           - Check liver enzymes (AST, ALT, alkaline                            phosphatase, bilirubin) in the morning.                           - Observe patient's clinical course.                           - Await cytology results and await path results.                           - Watch for pancreatitis, bleeding, perforation,                            and cholangitis.                           - Would hold chemical VTE prophylaxis for 48 hours                            to decrease risk of post-sphincterotomy bleeding.                             If anticoagulation is necessary consider heparin                            gtt without bolus in 6-hours and monitor closely.                           - Continue Ciprofloxacin 500 mg BID x 5-day course                            in setting of this hilar stenosis to decrease risk                            of post-interventional infectious complications.                           - If cytology and pathology from today is negative                            or non-diagnostic of malignancy, I think that VIR  should be consulted for a percutaneous biopsy of                            the lesion noted in the RUQ since I could not get                            one via EUS today.                           - The findings and recommendations were discussed                            with the patient.                           - The findings and recommendations were discussed                            with the patient's family.                           - The findings and recommendations were discussed                            with the referring physician. Procedure Code(s):        --- Professional ---                           [phone], Endoscopic retrograde                            cholangiopancreatography (ERCP); with placement of                            endoscopic stent into biliary or pancreatic duct,                            including pre- and post-dilation and guide wire                            passage, when performed, including sphincterotomy,                            when performed, each stent                           [id number], 49, Endoscopic retrograde                            cholangiopancreatography (ERCP); with placement of                            endoscopic stent into biliary or pancreatic duct,                            including pre- and post-dilation and  guide wire                            passage, when performed,  including sphincterotomy,                            when performed, each stent                           [id number], Endoscopic retrograde                            cholangiopancreatography (ERCP); with removal of                            calculi/debris from biliary/pancreatic duct(s) Diagnosis Code(s):        --- Professional ---                           K83.1, Obstruction of bile duct                           K83.9, Disease of biliary tract, unspecified                           D49.0, Neoplasm of unspecified behavior of                            digestive system                           R17, Unspecified jaundice                           R94.5, Abnormal results of liver function studies CPT copyright 2019 [employer]. All rights reserved. The codes documented in this report are preliminary and upon coder review may  be revised to meet current compliance requirements. Justice Britain, MD 05/22/2020 4:40:33 PM Number of Addenda: 0

## 2020-05-22 NOTE — Anesthesia Postprocedure Evaluation (Signed)
Anesthesia Post Note  Patient: John Dickerson  Procedure(s) Performed: UPPER ESOPHAGEAL ENDOSCOPIC ULTRASOUND (EUS) (N/A ) ENDOSCOPIC RETROGRADE CHOLANGIOPANCREATOGRAPHY (ERCP) WITH PROPOFOL (N/A ) BIOPSY SPHINCTEROTOMY REMOVAL OF STONES BILIARY BRUSHING BILIARY DILATION BILIARY STENT PLACEMENT     Patient location during evaluation: PACU Anesthesia Type: General Level of consciousness: awake and alert Pain management: pain level controlled Vital Signs Assessment: post-procedure vital signs reviewed and stable Respiratory status: spontaneous breathing, nonlabored ventilation, respiratory function stable and patient connected to nasal cannula oxygen Cardiovascular status: blood pressure returned to baseline and stable Postop Assessment: no apparent nausea or vomiting Anesthetic complications: no   No complications documented.  Last Vitals:  Vitals:   05/22/20 1620 05/22/20 1638  BP: 124/70 108/70  Pulse: 71 76  Resp: 16 18  Temp:  36.7 C  SpO2: 100% 99%    Last Pain:  Vitals:   05/22/20 1638  TempSrc: Oral  PainSc:                  [name] P Tsion Inghram

## 2020-05-22 NOTE — Interval H&P Note (Signed)
History and Physical Interval Note:  05/22/2020 1:04 PM  John Dickerson  has presented today for surgery, with the diagnosis of Obstructing liver mass, jaundice..  The various methods of treatment have been discussed with the patient and family. After consideration of risks, benefits and other options for treatment, the patient has consented to  Procedure(s): UPPER ESOPHAGEAL ENDOSCOPIC ULTRASOUND (EUS) (N/A) ENDOSCOPIC RETROGRADE CHOLANGIOPANCREATOGRAPHY (ERCP) WITH PROPOFOL (N/A) as a surgical intervention.  The patient's history has been reviewed, patient examined, no change in status, stable for surgery.  I have reviewed the patient's chart and labs.  Questions were answered to the patient's satisfaction.     The risks of an EUS including intestinal perforation, bleeding, infection, aspiration, and medication effects were discussed as was the possibility it may not give a definitive diagnosis if a biopsy is performed.  When a biopsy of the pancreas is done as part of the EUS, there is an additional risk of pancreatitis at the rate of about 1-2%.  It was explained that procedure related pancreatitis is typically mild, although it can be severe and even life threatening, which is why we do not perform random pancreatic biopsies and only biopsy a lesion/area we feel is concerning enough to warrant the risk.  The risks of an ERCP were discussed at length, including but not limited to the risk of perforation, bleeding, abdominal pain, post-ERCP pancreatitis (while usually mild can be severe and even life threatening).   [employer]

## 2020-05-22 NOTE — Transfer of Care (Signed)
Immediate Anesthesia Transfer of Care Note  Patient: John Dickerson  Procedure(s) Performed: UPPER ESOPHAGEAL ENDOSCOPIC ULTRASOUND (EUS) (N/A ) ENDOSCOPIC RETROGRADE CHOLANGIOPANCREATOGRAPHY (ERCP) WITH PROPOFOL (N/A ) BIOPSY SPHINCTEROTOMY REMOVAL OF STONES BILIARY BRUSHING BILIARY DILATION BILIARY STENT PLACEMENT  Patient Location: PACU  Anesthesia Type:General  Level of Consciousness: awake, alert  and oriented  Airway & Oxygen Therapy: Patient Spontanous Breathing and Patient connected to face mask oxygen  Post-op Assessment: Report given to RN and Post -op Vital signs reviewed and stable  Post vital signs: Reviewed and stable  Last Vitals:  Vitals Value Taken Time  BP 124/70 05/22/20 1620  Temp 36.9 C 05/22/20 1605  Pulse 75 05/22/20 1621  Resp 21 05/22/20 1621  SpO2 99 % 05/22/20 1621  Vitals shown include unvalidated device data.  Last Pain:  Vitals:   05/22/20 1605  TempSrc:   PainSc: 0-No pain      Patients Stated Pain Goal: 0 (09/47/09 6283)  Complications: No complications documented.

## 2020-05-22 NOTE — Anesthesia Preprocedure Evaluation (Signed)
Anesthesia Evaluation  Patient identified by MRN, date of birth, ID band Patient awake    Reviewed: Allergy & Precautions, NPO status , Patient's Chart, lab work & pertinent test results  Airway Mallampati: III  TM Distance: >3 FB Neck ROM: Full    Dental no notable dental hx.    Pulmonary former smoker,    Pulmonary exam normal breath sounds clear to auscultation       Cardiovascular hypertension, Pt. on medications and Pt. on home beta blockers + CAD, + Cardiac Stents and + Peripheral Vascular Disease  Normal cardiovascular exam Rhythm:Regular Rate:Normal  ECG: rate 66   Neuro/Psych negative neurological ROS  negative psych ROS   GI/Hepatic negative GI ROS, (+) Hepatitis -, CJaundice   Endo/Other  diabetes, Oral Hypoglycemic Agentshyponatremia  Renal/GU negative Renal ROS     Musculoskeletal  (+) Arthritis , Gout   Abdominal   Peds  Hematology  (+) anemia , HIV, HLD   Anesthesia Other Findings Obstructing liver mass, jaundice  Reproductive/Obstetrics                             Anesthesia Physical Anesthesia Plan  ASA: III  Anesthesia Plan: General   Post-op Pain Management:    Induction: Intravenous  PONV Risk Score and Plan: 2 and Ondansetron, Dexamethasone, Midazolam and Treatment may vary due to age or medical condition  Airway Management Planned: Oral ETT  Additional Equipment:   Intra-op Plan:   Post-operative Plan: Extubation in OR  Informed Consent: I have reviewed the patients History and Physical, chart, labs and discussed the procedure including the risks, benefits and alternatives for the proposed anesthesia with the patient or authorized representative who has indicated his/her understanding and acceptance.     Dental advisory given  Plan Discussed with: CRNA  Anesthesia Plan Comments:         Anesthesia Quick Evaluation

## 2020-05-22 NOTE — Op Note (Signed)
[facility] Patient Name: John Dickerson Procedure Date : 05/22/2020 MRN: [id number] Attending MD: Justice Britain , MD Date of Birth: 1960/09/30 CSN: [id number] Age: 59 Admit Type: Inpatient Procedure:                Upper EUS Indications:              Common bile duct dilation (acquired) seen on MRCP,                            Obstruction of bile duct on MRCP, Abnormal MRCP Providers:                Justice Britain, MD, Glori Bickers, RN, Ladona Ridgel, Technician, Benetta Spar, Technician Referring MD:             Gerrit Heck, MD, Inpatient Medical Service Medicines:                General Anesthesia Complications:            No immediate complications. Estimated Blood Loss:     Estimated blood loss was minimal. Procedure:                Pre-Anesthesia Assessment:                           - Prior to the procedure, a History and Physical                            was performed, and patient medications and                            allergies were reviewed. The patient's tolerance of                            previous anesthesia was also reviewed. The risks                            and benefits of the procedure and the sedation                            options and risks were discussed with the patient.                            All questions were answered, and informed consent                            was obtained. Prior Anticoagulants: The patient has                            taken Lovenox (enoxaparin), last dose was 1 day                            prior to procedure. ASA Grade Assessment: III - A  patient with severe systemic disease. After                            reviewing the risks and benefits, the patient was                            deemed in satisfactory condition to undergo the                            procedure.                           After obtaining informed consent,  the endoscope was                            passed under direct vision. Throughout the                            procedure, the patient's blood pressure, pulse, and                            oxygen saturations were monitored continuously. The                            [id number] ([id number]) Olympus Linear EUS was                            introduced through the mouth, and advanced to the                            duodenum for ultrasound examination from the                            stomach and duodenum. The upper EUS was                            accomplished without difficulty. The patient                            tolerated the procedure. Scope In: Scope Out: Findings:      ENDOSCOPIC FINDING: :      No gross lesions were noted in the proximal and middle esophagus.      LA Grade A (one or more mucosal breaks less than 5 mm, not extending       between tops of 2 mucosal folds) esophagitis with no bleeding was found       in the distal esophagus.      The Z-line was irregular and was found 41 cm from the incisors.      Patchy mildly erythematous mucosa without bleeding was found in the       entire examined stomach. Biopsies were taken with a cold forceps for       histology and Helicobacter pylori testing.      No gross lesions were noted in the duodenal bulb, in the first portion       of the duodenum and in the  second portion of the duodenum. Biopsies were       taken with a cold forceps for histology.      ENDOSONOGRAPHIC FINDING: :      There was no sign of significant endosonographic abnormality in the       common bile duct. No stones were identified. CBD = 2.2 mm -> 4.8 mm. CHD       not able to be visualized even with rotation of EUS scope in both       lateral position right station and lateral position left station.      Pancreatic parenchymal abnormalities were noted in the entire pancreas.       These consisted of atrophy, hyperechoic foci with shadowing, lobularity        with honeycombing and hyperechoic strands. This is consistent with       Rosemont criteria for Chronic Pancreatitis diagnosis. The pancreatic       duct was normal sized throughout (PDH = 2.6 mm, PDN = 1.3 -> 1.0 mm, PDB       = 1.0 mm, PDT 0.6 mm).      Endosonographic imaging in the visualized portion of the liver showed no       mass or lesion but suggested biliary ductal dilation in the       intrahepatics of the left-lobe of the liver. Not clearly defined is the       previously noted MRI imaging of a mass suprathe pancreatic head however       on EUS.      No malignant-appearing lymph nodes were visualized in the peripancreatic       region.      The celiac region was visualized. Impression:               EGD Impression:                           - No gross lesions in esophagus proximally. LA                            Grade A esophagitis with no bleeding distally.                            Z-line irregular, 41 cm from the incisors.                           - Erythematous mucosa in the stomach. Biopsied.                           - No gross lesions in the duodenal bulb, in the                            first portion of the duodenum and in the second                            portion of the duodenum. Biopsied.                           EUS Impression:                           -  There was no sign of significant pathology in the                            common bile duct. I could not completely visualize                            the CHD region - query narrowing/stenosis                            (hopefully will get better sense with ERCP attempt).                           - Nov overt visualization of a supra-pancreatic                            head lesion as noted on MRI vs could this lesion be                            more right-sided. Could not find an area to sample                            via EUS.                           - Pancreatic parenchymal  abnormalities consisting                            of atrophy, hyperechoic foci, lobularity with                            honeycombing and hyperechoic strands were noted in                            the entire pancreas. This is consistent via                            Rosemont criteria for a diagnosis of chronic                            pancreatitis.                           - No malignant-appearing lymph nodes were                            visualized in the peripancreatic region. Recommendation:           - Proceed to scheduled ERCP.                           - Await path results.                           - Although patient's presentation has not been  consistent with chronic recurrent pancreatitis or                            chronic pancreatitis pain he has EUS evidence of                            Chronic pancreatitis. If there is finding of loose                            bowel movements then would send Fecal elastase and                            consider Pancreatic Enzyme Replacement Therapy as                            needed.                           - The findings and recommendations were discussed                            with the patient.                           - The findings and recommendations were discussed                            with the patient's family.                           - The findings and recommendations were discussed                            with the referring physician. Procedure Code(s):        --- Professional ---                           [id number], Esophagogastroduodenoscopy, flexible,                            transoral; with endoscopic ultrasound examination                            limited to the esophagus, stomach or duodenum, and                            adjacent structures                           43239, Esophagogastroduodenoscopy, flexible,                            transoral; with biopsy,  single or multiple Diagnosis Code(s):        --- Professional ---  K20.90, Esophagitis, unspecified without bleeding                           K22.8, Other specified diseases of esophagus                           K31.89, Other diseases of stomach and duodenum                           K86.9, Disease of pancreas, unspecified                           I89.9, Noninfective disorder of lymphatic vessels                            and lymph nodes, unspecified                           K83.1, Obstruction of bile duct                           K83.8, Other specified diseases of biliary tract                           R93.2, Abnormal findings on diagnostic imaging of                            liver and biliary tract CPT copyright 2019 [employer] Association. All rights reserved. The codes documented in this report are preliminary and upon coder review may  be revised to meet current compliance requirements. Justice Britain, MD 05/22/2020 5:47:33 PM Number of Addenda: 0

## 2020-05-22 NOTE — Anesthesia Procedure Notes (Signed)
Procedure Name: Intubation Performed by: Vane Yapp H, CRNA Pre-anesthesia Checklist: Patient identified, Emergency Drugs available, Suction available and Patient being monitored Patient Re-evaluated:Patient Re-evaluated prior to induction Oxygen Delivery Method: Circle System Utilized Preoxygenation: Pre-oxygenation with 100% oxygen Induction Type: IV induction Ventilation: Mask ventilation without difficulty Laryngoscope Size: Miller and 2 Grade View: Grade I Tube type: Oral Tube size: 7.5 mm Number of attempts: 1 Airway Equipment and Method: Stylet and Oral airway Placement Confirmation: ETT inserted through vocal cords under direct vision,  positive ETCO2 and breath sounds checked- equal and bilateral Secured at: 23 cm Tube secured with: Tape Dental Injury: Teeth and Oropharynx as per pre-operative assessment        

## 2020-05-23 ENCOUNTER — Encounter (HOSPITAL_COMMUNITY): Payer: Self-pay | Admitting: Gastroenterology

## 2020-05-23 DIAGNOSIS — K3189 Other diseases of stomach and duodenum: Secondary | ICD-10-CM

## 2020-05-23 DIAGNOSIS — E43 Unspecified severe protein-calorie malnutrition: Secondary | ICD-10-CM

## 2020-05-23 DIAGNOSIS — Z9889 Other specified postprocedural states: Secondary | ICD-10-CM

## 2020-05-23 LAB — CBC
HCT: 24.2 % — ABNORMAL LOW (ref 39.0–52.0)
Hemoglobin: 8.4 g/dL — ABNORMAL LOW (ref 13.0–17.0)
MCH: 29.5 pg (ref 26.0–34.0)
MCHC: 34.7 g/dL (ref 30.0–36.0)
MCV: 84.9 fL (ref 80.0–100.0)
Platelets: 535 10*3/uL — ABNORMAL HIGH (ref 150–400)
RBC: 2.85 MIL/uL — ABNORMAL LOW (ref 4.22–5.81)
RDW: 20.2 % — ABNORMAL HIGH (ref 11.5–15.5)
WBC: 11.4 10*3/uL — ABNORMAL HIGH (ref 4.0–10.5)
nRBC: 0 % (ref 0.0–0.2)

## 2020-05-23 LAB — COMPREHENSIVE METABOLIC PANEL
ALT: 86 U/L — ABNORMAL HIGH (ref 0–44)
AST: 109 U/L — ABNORMAL HIGH (ref 15–41)
Albumin: 1.6 g/dL — ABNORMAL LOW (ref 3.5–5.0)
Alkaline Phosphatase: 1061 U/L — ABNORMAL HIGH (ref 38–126)
Anion gap: 9 (ref 5–15)
BUN: 20 mg/dL (ref 8–23)
CO2: 18 mmol/L — ABNORMAL LOW (ref 22–32)
Calcium: 10.1 mg/dL (ref 8.9–10.3)
Chloride: 105 mmol/L (ref 98–111)
Creatinine, Ser: 1.13 mg/dL (ref 0.61–1.24)
GFR, Estimated: >60 mL/min (ref >60)
Glucose, Bld: 185 mg/dL — ABNORMAL HIGH (ref 70–99)
Potassium: 3.9 mmol/L (ref 3.5–5.1)
Sodium: 132 mmol/L — ABNORMAL LOW (ref 135–145)
Total Bilirubin: 8.4 mg/dL — ABNORMAL HIGH (ref 0.3–1.2)
Total Protein: 6.1 g/dL — ABNORMAL LOW (ref 6.5–8.1)

## 2020-05-23 LAB — CMV IGM: CMV IgM: <30 AU/mL (ref 0.0–29.9)

## 2020-05-23 LAB — EPSTEIN-BARR VIRUS VCA, IGM: EBV VCA IgM: <36 U/mL (ref 0.0–35.9)

## 2020-05-23 MED ORDER — CIPROFLOXACIN HCL 500 MG PO TABS
500.0000 mg | ORAL_TABLET | Freq: Two times a day (BID) | ORAL | Status: DC
  Administered 2020-05-23 – 2020-05-27 (×9): 500 mg via ORAL
  Filled 2020-05-23 (×9): qty 1

## 2020-05-23 MED ORDER — SODIUM CHLORIDE 0.9 % IV SOLN: INTRAVENOUS | Status: DC

## 2020-05-23 MED ORDER — SODIUM CHLORIDE 0.9 % IV SOLN: INTRAVENOUS | Status: AC

## 2020-05-23 NOTE — Progress Notes (Signed)
Daily Rounding Note  05/23/2020, 1:26 PM  LOS: 4 days   SUBJECTIVE:   Chief complaint:   Obstructing jaundice.     Pt feeling a little better.  Ate ~ 1/2 of cheeseburger just now.  Appetite is better but still reduced.  No abd pain, no nausea.  Some blood is coming out of perc drain, he emptied this himself and is not notifying staff so output volumes not getting recorded.    OBJECTIVE:         Vital signs in last 24 hours:    Temp:  [97.5 F (36.4 C)-98.5 F (36.9 C)] 97.5 F (36.4 C) (12/04 1942) Pulse Rate:  [69-80] 80 (12/04 1942) Resp:  [12-18] 16 (12/04 1942) BP: (108-124)/(66-75) 117/66 (12/04 1942) SpO2:  [99 %-100 %] 100 % (12/04 1942) Last BM Date: 05/23/20 Filed Weights   05/21/20 0559 05/22/20 0530 05/22/20 1244  Weight: 68.3 kg 67.6 kg 67.6 kg   General: jaundiced, icteric.  Comfortable.     Heart: RRR Chest: clear , no labored resps Abdomen: soft, NT, ND.  Scant amount of blood in perc drain on R abdomen.   Extremities: no CCE Neuro/Psych:  Oriented x 3.  Cooperative.    Intake/Output from previous day: 12/04 0701 - 12/05 0700 In: 1823 [P.O.:360; I.V.:1463] Out: 1175 [Urine:1175]  Intake/Output this shift: Total I/O In: 360 [P.O.:360] Out: -   Lab Results: Recent Labs    05/21/20 0613 05/22/20 0622 05/23/20 0327  WBC 13.3* 15.0* 11.4*  HGB 10.7* 9.7* 8.4*  HCT 31.2* 28.3* 24.2*  PLT 683* 575* 535*   BMET Recent Labs    05/21/20 0613 05/22/20 0622 05/23/20 0327  NA 130* 130* 132*  K 4.2 3.5 3.9  CL 101 103 105  CO2 18* 16* 18*  GLUCOSE 112* 111* 185*  BUN 23 17 20   CREATININE 1.68* 1.17 1.13  CALCIUM 11.2* 10.7* 10.1   LFT Recent Labs    05/21/20 0613 05/22/20 0622 05/23/20 0327  PROT 7.0 6.3* 6.1*  ALBUMIN 1.8* 1.7* 1.6*  AST 163* 126* 109*  ALT 114* 96* 86*  ALKPHOS 1,509* 1,266* 1,061*  BILITOT 10.5* 9.7* 8.4*   PT/INR No results for input(s): LABPROT, INR in  the last 72 hours. Hepatitis Panel No results for input(s): HEPBSAG, HCVAB, HEPAIGM, HEPBIGM in the last 72 hours.  Studies/Results: DG ERCP BILIARY & PANCREATIC DUCTS  Result Date: 05/22/2020 CLINICAL DATA:  ERCP. EXAM: ERCP TECHNIQUE: Multiple spot images obtained with the fluoroscopic device and submitted for interpretation post-procedure. FLUOROSCOPY TIME:  Fluoroscopy Time:  8 minutes and 21 seconds. Number of Acquired Spot Images: 23 COMPARISON:  MRI dated 05/20/2020 FINDINGS: Cannulation of the common bile duct with injection of contrast demonstrates intrahepatic biliary ductal dilatation. Subsequent images demonstrate placement 2 plastic biliary stents. IMPRESSION: ERCP with stent placement as detailed above. These images were submitted for radiologic interpretation only. Please see the procedural report for the amount of contrast and the fluoroscopy time utilized. Electronically Signed   By: Constance Holster M.D.   On: 05/22/2020 18:40    ASSESMENT:   *    Painless jaundice.  Obstructing mass in the locus of central liver. 05/22/2020 ERCP and upper EUS. ERCP: severe CHD stricture of undetermined at etiology, brushed for cytology.  Severe dilation left main hepatic duct and left intrahepatic branches.  Not able to visualize right hepatic duct system.  Sphincterotomy performed, biliary tree swept with emergence of sludge and blood  clots.  2 plastic stents (8.5 Fr x 12 cm 8.5 Fr x 9 cm) placed into left hepatic duct system with good resulting drainage.   EUS showed no significant CBD pathology, not able to completely visualize CHD.  Did not visualize supra pancreatic head lesion noted on MRI, question if the lesion more right sided.  Unable to find area for FNA, sampling via EUS.  Pancreatic atrophy lobularity, honeycombing, hyper echoic foci consistent with diagnosis of chronic pancreatitis.  No malignant appearing lymph nodes.  *    Acalculus cholecystitis, percutaneous drain placed during  mid September 2021 admission.  Drain was exchanged on 11/4 but remains in place.    Pt reports bloody drainage starting today.    *   Eradicated hepatitis C  *    HIV, well controlled.  Follows with Dr. Johnnye Sima.   PLAN   *   Await cytology report from ductal brushings.  If cytology/pathology negative for malignancy would refer to IR for percutaneous biopsy. Complete 5-day course of ciprofloxacin 500 mg bid.      John Dickerson  05/23/2020, 1:26 PM Phone [phone]

## 2020-05-23 NOTE — Progress Notes (Signed)
HD#4 Subjective:  Overnight Events: ERCP and EUC performed overnight.    Patient resting comfortably in bed. States that he tolerated the procedure well yesterday. He denies new symtpoms today.  Objective:  Vital signs in last 24 hours: Vitals:   05/22/20 1605 05/22/20 1620 05/22/20 1638 05/22/20 1942  BP: 119/75 124/70 108/70 117/66  Pulse: 69 71 76 80  Resp: 12 16 18 16   Temp: 98.5 F (36.9 C)  98.1 F (36.7 C) (!) 97.5 F (36.4 C)  TempSrc:   Oral Oral  SpO2: 100% 100% 99% 100%  Weight:      Height:       Supplemental O2: Room Air SpO2: 100 %    Physical Exam:  Physical Exam Constitutional:      Appearance: Normal appearance.  HENT:     Head: Normocephalic and atraumatic.  Eyes:     General: Scleral icterus present.     Extraocular Movements: Extraocular movements intact.  Cardiovascular:     Rate and Rhythm: Normal rate.     Pulses: Normal pulses.     Heart sounds: Normal heart sounds.  Pulmonary:     Effort: Pulmonary effort is normal.     Breath sounds: Normal breath sounds.  Abdominal:     General: Bowel sounds are normal.     Palpations: Abdomen is soft.     Tenderness: There is no abdominal tenderness.  Musculoskeletal:        General: Normal range of motion.     Cervical back: Normal range of motion.     Right lower leg: No edema.     Left lower leg: No edema.  Skin:    General: Skin is warm and dry.  Neurological:     Mental Status: He is alert and oriented to person, place, and time. Mental status is at baseline.  Psychiatric:        Mood and Affect: Mood normal.     Filed Weights   05/21/20 0559 05/22/20 0530 05/22/20 1244  Weight: 68.3 kg 67.6 kg 67.6 kg     Intake/Output Summary (Last 24 hours) at 05/23/2020 0639 Last data filed at 05/23/2020 0036 Gross per 24 hour  Intake 1822.98 ml  Output 1225 ml  Net 597.98 ml   Net IO Since Admission: 5,123.68 mL [05/23/20 0639]  Pertinent Labs: CBC Latest Ref Rng & Units 05/23/2020  05/22/2020 05/21/2020  WBC 4.0 - 10.5 K/uL 11.4(H) 15.0(H) 13.3(H)  Hemoglobin 13.0 - 17.0 g/dL 8.4(L) 9.7(L) 10.7(L)  Hematocrit 39 - 52 % 24.2(L) 28.3(L) 31.2(L)  Platelets 150 - 400 K/uL 535(H) 575(H) 683(H)    CMP Latest Ref Rng & Units 05/23/2020 05/22/2020 05/21/2020  Glucose 70 - 99 mg/dL 185(H) 111(H) 112(H)  BUN 8 - 23 mg/dL 20 17 23   Creatinine 0.61 - 1.24 mg/dL 1.13 1.17 1.68(H)  Sodium 135 - 145 mmol/L 132(L) 130(L) 130(L)  Potassium 3.5 - 5.1 mmol/L 3.9 3.5 4.2  Chloride 98 - 111 mmol/L 105 103 101  CO2 22 - 32 mmol/L 18(L) 16(L) 18(L)  Calcium 8.9 - 10.3 mg/dL 10.1 10.7(H) 11.2(H)  Total Protein 6.5 - 8.1 g/dL 6.1(L) 6.3(L) 7.0  Total Bilirubin 0.3 - 1.2 mg/dL 8.4(H) 9.7(H) 10.5(H)  Alkaline Phos 38 - 126 U/L 1,061(H) 1,266(H) 1,509(H)  AST 15 - 41 U/L 109(H) 126(H) 163(H)  ALT 0 - 44 U/L 86(H) 96(H) 114(H)    Imaging: DG ERCP BILIARY & PANCREATIC DUCTS  Result Date: 05/22/2020 CLINICAL DATA:  ERCP. EXAM: ERCP TECHNIQUE:  Multiple spot images obtained with the fluoroscopic device and submitted for interpretation post-procedure. FLUOROSCOPY TIME:  Fluoroscopy Time:  8 minutes and 21 seconds. Number of Acquired Spot Images: 23 COMPARISON:  MRI dated 05/20/2020 FINDINGS: Cannulation of the common bile duct with injection of contrast demonstrates intrahepatic biliary ductal dilatation. Subsequent images demonstrate placement 2 plastic biliary stents. IMPRESSION: ERCP with stent placement as detailed above. These images were submitted for radiologic interpretation only. Please see the procedural report for the amount of contrast and the fluoroscopy time utilized. Electronically Signed   By: Constance Holster M.D.   On: 05/22/2020 18:40    ERCP: - The major papilla appeared normal. - A single severe biliary stricture was found in the common hepatic duct. The stricture was indeterminate. This was eventually dilated. This was brushed for cytology purposes. - The left main hepatic  duct and left intrahepatic branches were severely dilated, with an obstruction due to the aforementioned stenosis. - The right hepatic duct system could not be visualized. - The gallbladder and cystic duct was not visualized. - A biliary sphincterotomy was performed. - The biliary tree was swept and sludge and clots and presumed tissue were found. Tissue/clots sent to pathology for review. - Two plastic stents were placed into the left hepatic duct system with good drainage Obtained.    Assessment/Plan:   Principal Problem:   Painless jaundice Active Problems:   Hypercalcemia   Protein-calorie malnutrition, severe   Elevated liver enzymes   Elevated alkaline phosphatase level   Loss of weight   Patient Summary: John Dickerson is a 59 y.o. male with history of well-controlled HIV, HTN, CAD, recent diagnosis of acalculous cholecystitis s/p percutaneous drain placement who presents with progressive weight loss, fatigue, and jaundice and undergoing further work up painless jaundice and hypercalcemia. Found to have a masslike structure in the liver causing duct obstruction and narrowing of the PV.   Principal Problem:   Painless jaundice Active Problems:   Hypercalcemia   Protein-calorie malnutrition, severe   Elevated liver enzymes   Elevated alkaline phosphatase level   Loss of weight  Painless jaundice  Presented with unintentional weight loss, poor appetite, and jaundice and found to have cholestatic pattern on CMP with significantly elevated alkaline phos. He is s/p MRCP showing bilary mass concerning for cholangiocarcinoma. GI consulted and performed EUS/ERCP with biopsy and brushings taken. Patient continues to have cholestasis pattern on CMP with mildly improved transaminase enzyme. We will follow these closely for post procedure complications.  - Appreciate GI's assistance co-managing this patient - Trend liver enzymes and watch for pancreatitis, bleeding, and/or  perforation.  - Hold VTE ppx for 48 to decrease risk of bleeding.  - Pending pathology  - Continue ciprofloxacin 500 mg BID x 5 days for hilar stenosis  - If pathology is negative, GI recommends VIR for percutaneous biopsy.   Hypercalcemia Patient calcium is at 10.1 today with a corrected calcium of 12.0. He is s/p 1x Calcitonin and net + 4 liters since admission with good urine output. Vitamin D, calcitriol, and PTH are low. PTHrp pending. Ca continue to trend downward with IVF. Continues to deny associated symptoms.  - Continue NS at 125 cc/hr - Daily CMP  HTN Patient found to have soft BP with systolic in the 67Y on arrival. S/p 3 L of NS bolus. BP improved to 118/71 this morning. Will monitor closely --Continue IVF as above --Holding home anti-hypertensives --Daily vitals  HIV Chronic and stable. On viral load undetectable on  Biktarvy. Holding Bassett for now due to severe hepatic impairment Child-Pugh score of 10 (Class C). --Will follow up with ID doc  Diet: Normal IVF: NS,125cc/hr VTE: SCDs (hold pharm VTE Ppx for 48 form procedure) Code: Full PT/OT recs: Pending, none. TOC recs: pending   Dispo: Anticipated discharge to Home in 3 days pending improvement.    Please contact the on call pager after 5 pm and on weekends at [phone].

## 2020-05-23 NOTE — Hospital Course (Addendum)
Painless jaundice/Biliary carcinoma Patient with recent history of acalculous cholecystitis and drain placement to relieved the obstruction 1 month ago. Presented with unintentional weight loss, poor appetite, fatigue and jaundice. Lab findings was concerning for pancreatic malignancy. MRCP was done and showed masslike area in the central liver obstructing the bile ducts, common hepatic duct and narrowing the portal vein at biliary confluence a highly suspicious for aggressive biliary neoplasm vs cholangiocarcinoma vs gallbladder carcinoma. GI was consulted and they performed EUS/ERCP with biopsy.  brushings were taken for cytology/pathology and 2 plastic stents placed into left hepatic duct system with good resulting drainage. LTFs continued to improve after stent placement. He was started on Ciprofloxacin 500 mg for 5 days and put on Protonix 40 mg daily. CA-19-9 was elevated to 753. 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 A CT abd/pel was recommended by GI and it confirmed ill-defined infiltrating neoplastic process in the central liver inferiorly and possible partial thrombosis of the middle and right portal veins. Oncology was consulted and they ordered a CT chest to assess for possible lung malignancy. CT chest was negatuive for lung mass. Oncology will follow up with patient on December 14th. GI will arrange ERCP with elective biliary stent exchange in a few months as outpatient   Hypercalcemia Patient found to have an elevated calcium of 14.2 in clinic. He was given Calcitonin x1 dose and net +5 liters during admission with good urine output. Vitamin D, calcitriol, and PTH were all low. PTHrP was normal. Calcium dropped to 8.6 but increased to 9.3 on day of discharge. Oncology recommended steroids to help stabilize hypercalcemia so patient was discharged on Dexamethasone 4 mg BID. He will need lab check  and glucose monitoring. A1c is 6.4 and he is only on metformin.    HTN Patient found to have soft BP with systolic in the 23N on arrival. He was given 3 L of NS bolus and an additional 3 L IVNS during hospitalization. All his anti-hypertensives were held on admission. BP on day of discharge was 137/68. His home medications were restarted at discharge.   Normocytic Anemia Patient's hgb gradually trended down from 11.5 on admission to 7.8 at discharge. There were no signs of active bleeding. He denied any hematuria, hemoptysis or bloody stools. Likely anemia of chronic disease. FOBT was negative. Will need CBC recheck in about a week after discharge.    HIV Chronic and stable. Viral load undetectable. Held Hanson for a few days due to hepatic dysfunction and started it after improvements from his LFTs. Will follow up with ID doc.

## 2020-05-24 ENCOUNTER — Other Ambulatory Visit: Payer: Self-pay | Admitting: Internal Medicine

## 2020-05-24 ENCOUNTER — Telehealth: Payer: Self-pay

## 2020-05-24 DIAGNOSIS — R160 Hepatomegaly, not elsewhere classified: Secondary | ICD-10-CM

## 2020-05-24 DIAGNOSIS — K831 Obstruction of bile duct: Secondary | ICD-10-CM

## 2020-05-24 DIAGNOSIS — R17 Unspecified jaundice: Secondary | ICD-10-CM | POA: Diagnosis not present

## 2020-05-24 LAB — COMPREHENSIVE METABOLIC PANEL
ALT: 72 U/L — ABNORMAL HIGH (ref 0–44)
AST: 78 U/L — ABNORMAL HIGH (ref 15–41)
Albumin: 1.5 g/dL — ABNORMAL LOW (ref 3.5–5.0)
Alkaline Phosphatase: 793 U/L — ABNORMAL HIGH (ref 38–126)
Anion gap: 10 (ref 5–15)
BUN: 17 mg/dL (ref 8–23)
CO2: 19 mmol/L — ABNORMAL LOW (ref 22–32)
Calcium: 9 mg/dL (ref 8.9–10.3)
Chloride: 103 mmol/L (ref 98–111)
Creatinine, Ser: 1.16 mg/dL (ref 0.61–1.24)
GFR, Estimated: >60 mL/min (ref >60)
Glucose, Bld: 163 mg/dL — ABNORMAL HIGH (ref 70–99)
Potassium: 3.5 mmol/L (ref 3.5–5.1)
Sodium: 132 mmol/L — ABNORMAL LOW (ref 135–145)
Total Bilirubin: 4.5 mg/dL — ABNORMAL HIGH (ref 0.3–1.2)
Total Protein: 5.9 g/dL — ABNORMAL LOW (ref 6.5–8.1)

## 2020-05-24 LAB — CBC
HCT: 23.7 % — ABNORMAL LOW (ref 39.0–52.0)
Hemoglobin: 8.1 g/dL — ABNORMAL LOW (ref 13.0–17.0)
MCH: 29.1 pg (ref 26.0–34.0)
MCHC: 34.2 g/dL (ref 30.0–36.0)
MCV: 85.3 fL (ref 80.0–100.0)
Platelets: 535 10*3/uL — ABNORMAL HIGH (ref 150–400)
RBC: 2.78 MIL/uL — ABNORMAL LOW (ref 4.22–5.81)
RDW: 20.8 % — ABNORMAL HIGH (ref 11.5–15.5)
WBC: 12.3 10*3/uL — ABNORMAL HIGH (ref 4.0–10.5)
nRBC: 0 % (ref 0.0–0.2)

## 2020-05-24 LAB — GLUCOSE, CAPILLARY: Glucose-Capillary: 103 mg/dL — ABNORMAL HIGH (ref 70–99)

## 2020-05-24 MED ORDER — HEPARIN SODIUM (PORCINE) 5000 UNIT/ML IJ SOLN
5000.0000 [IU] | Freq: Three times a day (TID) | INTRAMUSCULAR | Status: DC
  Administered 2020-05-24 – 2020-05-27 (×10): 5000 [IU] via SUBCUTANEOUS
  Filled 2020-05-24 (×10): qty 1

## 2020-05-24 MED ORDER — PANTOPRAZOLE SODIUM 40 MG PO TBEC
40.0000 mg | DELAYED_RELEASE_TABLET | Freq: Every day | ORAL | Status: DC
  Administered 2020-05-24 – 2020-05-27 (×4): 40 mg via ORAL
  Filled 2020-05-24 (×5): qty 1

## 2020-05-24 NOTE — Care Management Important Message (Signed)
Important Message  Patient Details  Name: John Dickerson MRN: [id number] Date of Birth: 1961/03/07   Medicare Important Message Given:  Yes     [name] 05/24/2020, 10:39 AM

## 2020-05-24 NOTE — Progress Notes (Addendum)
Progress Note  Chief Complaint:         ASSESSMENT / PLAN:     # 59 yo male with multiple medical problems not limited to HIV, HCV, DM, HTN, CKD, CAD. Admitted with painless jaundice, weight loss, AKI, marked cholestasis and liver mass on MRCP, s/p EUS and ERCP yesterday with findings of chronic pancreatitis, no overt pancreatitis lesion to sample. On ERCP there was a severe CHD stricture with severe dilation of left main hepatic duct and left intrahepatic branches. Biliary sphincterotomy performed, the CBD was swept and sludge , clots and presumed tissue were found and sent to Pathology. Two plastic stents were placed into the left hepatic duct system with good drainage obtained. LFTs improved overnight after stent placement. --Awaiting bile duct brushing. If negative then recommend IR be consulted for a percutaneous biopsy of the lesion noted in the RUQ since we could not get one via EUS. --Continue Cipro for total of 5 days --EBV, CMV, Hep A, B, C serologies negative. SARS negative  # LA Grade A esophagitis and gastritis, biopsies pending --will start PO Protonix   # Acalculous cholecystitis in September, perc drain in place     Attending Physician Note   I have taken an interval history, reviewed the chart and examined the patient. I agree with the Advanced Practitioner's note, impression and recommendations.  ERCP with 2 stents placed across the CHD stricture into the left hepatic duct system with good biliary drainage indicated by steadily decreasing t bili.   Await bile duct cytology and if negative consult IR for consideration of liver mass biopsy  Cipro for 5 days  Start Protonix PO qd for LA Grade A esophagitis  Await gastric biopsies  Outpatient GI follow up with Dr. Wilfrid [facility] will follow peripherally and contact the patient with cytology and pathology results    Lucio Edward, MD [facility]    Endoscopic studies 05/24/20    EGD  Impression: - No gross lesions in esophagus proximally. LA Grade A esophagitis with no bleeding distally. Z-line irregular, 41 cm from the incisors. - Erythematous mucosa in the stomach. Biopsied. - No gross lesions in the duodenal bulb, in the first portion of the duodenum and in the second portion of the duodenum. Biopsied.  EUS Impression: - There was no sign of significant pathology in the common bile duct. I could not completely visualize the CHD region - query narrowing/stenosis (hopefully will get better sense with ERCP attempt). - Nov overt visualization of a supra-pancreatic head lesion as noted on MRI vs could this lesion be more right-sided. Could not find an area to sample via EUS. - Pancreatic parenchymal abnormalities consisting of atrophy, hyperechoic foci, lobularity with honeycombing and hyperechoic strands were noted in the entire pancreas. This is consistent via [place] criteria for a diagnosis of chronic pancreatitis. - No malignant-appearing lymph nodes were visualized in the peripancreatic region.  The major papilla appeared normal. - A single severe biliary stricture was found in the common hepatic duct. The stricture was indeterminate. This was eventually dilated. This was brushed for cytology purposes. - The left main hepatic duct and left intrahepatic branches were severely dilated, with an obstruction due to the aforementioned stenosis. - The right hepatic duct system could not be visualized. - The gallbladder and cystic duct was not visualized. - A biliary sphincterotomy was performed. - The biliary tree was swept and sludge and clots and presumed tissue were found. Tissue/clots sent to pathology for  review. - Two plastic stents were placed into the left hepatic duct system with good drainage obtained.       SUBJECTIVE:   He feels okay, no abdominal pain    OBJECTIVE:    Scheduled inpatient medications:  . ciprofloxacin  500 mg Oral BID  . feeding  supplement  237 mL Oral BID BM  . heparin injection (subcutaneous)  5,000 Units Subcutaneous Q8H  . indomethacin  100 mg Rectal Once   Continuous inpatient infusions:  . ampicillin-sulbactam (UNASYN) 3 g IVPB (Mini-Bag Plus)     PRN inpatient medications:   Vital signs in last 24 hours: Temp:  [97.4 F (36.3 C)-98 F (36.7 C)] 97.4 F (36.3 C) (12/05 2033) Resp:  [16] 16 (12/05 2033) BP: (109-115)/(59-83) 109/83 (12/05 2033) SpO2:  [100 %] 100 % (12/05 2034) Last BM Date: 05/23/20  Intake/Output Summary (Last 24 hours) at 05/24/2020 1033 Last data filed at 05/24/2020 0524 Gross per 24 hour  Intake 2014.62 ml  Output 2120 ml  Net -105.38 ml     Physical Exam:  . General: Alert, male in NAD . Heart:  Regular rate and rhythm. No lower extremity edema . Pulmonary: Normal respiratory effort . Abdomen: Soft, nondistended, nontender. Normal bowel sounds. Perc biliary drain with ~ 20 cc of bilious fluid in collection bag . Neurologic: Alert and oriented . Psych: Cooperative.   Filed Weights   05/21/20 0559 05/22/20 0530 05/22/20 1244  Weight: 68.3 kg 67.6 kg 67.6 kg    Intake/Output from previous day: 12/05 0701 - 12/06 0700 In: 2374.6 [P.O.:660; I.V.:1714.6] Out: 2120 [Urine:2100; Drains:20] Intake/Output this shift: No intake/output data recorded.    Lab Results: Recent Labs    05/22/20 0622 05/23/20 0327 05/24/20 0350  WBC 15.0* 11.4* 12.3*  HGB 9.7* 8.4* 8.1*  HCT 28.3* 24.2* 23.7*  PLT 575* 535* 535*   BMET Recent Labs    05/22/20 0622 05/23/20 0327 05/24/20 0350  NA 130* 132* 132*  K 3.5 3.9 3.5  CL 103 105 103  CO2 16* 18* 19*  GLUCOSE 111* 185* 163*  BUN 17 20 17   CREATININE 1.17 1.13 1.16  CALCIUM 10.7* 10.1 9.0   LFT Recent Labs    05/24/20 0350  PROT 5.9*  ALBUMIN 1.5*  AST 78*  ALT 72*  ALKPHOS 793*  BILITOT 4.5*   PT/INR No results for input(s): LABPROT, INR in the last 72 hours. Hepatitis Panel No results for input(s):  HEPBSAG, HCVAB, HEPAIGM, HEPBIGM in the last 72 hours.  DG ERCP BILIARY & PANCREATIC DUCTS  Result Date: 05/22/2020 CLINICAL DATA:  ERCP. EXAM: ERCP TECHNIQUE: Multiple spot images obtained with the fluoroscopic device and submitted for interpretation post-procedure. FLUOROSCOPY TIME:  Fluoroscopy Time:  8 minutes and 21 seconds. Number of Acquired Spot Images: 23 COMPARISON:  MRI dated 05/20/2020 FINDINGS: Cannulation of the common bile duct with injection of contrast demonstrates intrahepatic biliary ductal dilatation. Subsequent images demonstrate placement 2 plastic biliary stents. IMPRESSION: ERCP with stent placement as detailed above. These images were submitted for radiologic interpretation only. Please see the procedural report for the amount of contrast and the fluoroscopy time utilized. Electronically Signed   By: Constance Holster M.D.   On: 05/22/2020 18:40      Principal Problem:   Painless jaundice Active Problems:   Hypercalcemia   Protein-calorie malnutrition, severe   Elevated liver enzymes   Elevated alkaline phosphatase level   Loss of weight     LOS: 5 days   Tye Savoy ,  NP 05/24/2020, 10:33 AM

## 2020-05-24 NOTE — Telephone Encounter (Signed)
Mansouraty, Telford Nab., MD sent to Timothy Lasso, RN Patient needs follow-up ERCP in 4 to 6 months. Please place recall. Stent exchange. Thanks.  GM    ERCP recall placed for 4 months

## 2020-05-24 NOTE — Progress Notes (Signed)
Subjective:   Hospital day: 5  Overnight event: NAEOV.   This AM, patient was found laying comfortably in bed. He denies having nausea, abd pain, SOB, vomiting or pain. Has been able to eat well. States he is feeling better overall.   Objective:  Vital signs in last 24 hours: Vitals:   05/23/20 1435 05/23/20 2033 05/23/20 2034 05/24/20 0900  BP: (!) 115/59 109/83  129/66  Pulse:    77  Resp:  16  18  Temp: 98 F (36.7 C) (!) 97.4 F (36.3 C)  97.8 F (36.6 C)  TempSrc: Oral Oral  Oral  SpO2:   100% 100%  Weight:      Height:        Physical Exam  General: Pleasant, ill-appearing elderly man laying in bed. No acute distress. Head: Normocephalic. Atraumatic. HEENT: Sclera icterus resolved. MMM. CV: RRR. No murmurs, rubs, or gallops. No LE edema Pulmonary: Lungs CTAB. Normal effort. No wheezing or rales. Abdominal: Soft, nontender, nondistended. Normal bowel sounds. Perc cholecystostomy not draining any fluids. Extremities: Palpable pulses. Normal ROM. Skin: Warm and dry. No obvious rash or lesions. Jaundice resolved. Neuro: A&Ox3. Moves all extremities. Normal sensation. No focal deficit. Psych: Normal mood and affect  Assessment/Plan: John Dickerson is a 59 y.o. male with history of well-controlled HIV, HTN, CAD, recent diagnosis of acalculous cholecystitis s/p percutaneous drain placement who presents with progressive weight loss, fatigue, and jaundice and undergoing further work up painless jaundice and hypercalcemia. Found to have a masslike structure in the liver causing duct obstruction and narrowing of the PV.   Principal Problem:   Painless jaundice Active Problems:   Hypercalcemia   Protein-calorie malnutrition, severe   Elevated liver enzymes   Elevated alkaline phosphatase level   Loss of weight  Painless jaundice  Presented with unintentional weight loss, poor appetite, and jaundice and found to have cholestatic pattern on CMP with significantly  elevated alkaline phos. He is s/p MRCP showing bilary mass concerning for cholangiocarcinoma. GI consulted and performed EUS/ERCP with biopsy and brushings taken for cytology/pathology. 2 plastic stents placed into left hepatic duct system with good resulting drainage. Patient continues to have cholestasis pattern on CMP with mildly improved transaminase enzyme. We will follow these closely for post procedure complications. CA-19-9 elevated to 753. Patient feeling better and continues to deny abd pain, and N/V.  --Appreciate GI's assistance co-managing this patient --Trend liver enzymes and watch for pancreatitis, bleeding, and/or perforation.  --Restart VTE ppx today  --Pending pathology  --Continue ciprofloxacin 500 mg BID x 5 days for hilar stenosis (day 2 of 5) --If pathology is negative, GI recommends VIR for percutaneous biopsy --Continue IVF resuscitation --Daily CMP --Drain care --Consider starting home meds w/ improvement in labs  Hypercalcemia Patient found to have an elevated calcium of 14.2 in clinic. He is s/p 1x Calcitonin and net + 5 liters since admission with good urine output. Vitamin D, calcitriol, and PTH are low. PTHrp pending. Ca continue to trend downward with IVF. Continues to deny associated symptoms. Calcium today is normal at 9. --D/c IVF --F/u PTHrPep --Daily CMP  HTN Patient found to have soft BP with systolic in the 03J on arrival. S/p 3 L of NS bolus. BP is 129/66 this morning. Will monitor closely. --Continue IVF as above --Holding home anti-hypertensives --Daily vitals  HIV Chronic and stable. On viral load undetectable on Biktarvy. Holding Bulger for now due to severe hepatic impairment Child-Pugh score of 10 (Class C). --Will follow  up with ID doc  Diet: Regular IVF: None VTE: Heparin 5000 units subcu q8h CODE: Full Code  Prior to Admission Living Arrangement: Home Anticipated Discharge Location: Home Barriers to Discharge: Medical workup  Dispo: Anticipated discharge in approximately 1-2 day(s).   Signed: Lacinda Axon, MD 05/24/2020, 6:17 AM  Pager: [phone] Internal Medicine Teaching Service After 5pm on weekdays and 1pm on weekends: On Call pager: [phone]

## 2020-05-24 NOTE — Progress Notes (Signed)
ANTICOAGULATION CONSULT NOTE - Initial Consult  Pharmacy Consult for heparin Indication: VTE prophylaxis  No Known Allergies  Patient Measurements: Height: 5\' 4"  (162.6 cm) Weight: 67.6 kg (149 lb 0.5 oz) IBW/kg (Calculated) : 59.2  Vital Signs:    Labs: Recent Labs    05/22/20 0622 05/22/20 0622 05/23/20 0327 05/24/20 0350  HGB 9.7*   < > 8.4* 8.1*  HCT 28.3*  --  24.2* 23.7*  PLT 575*  --  535* 535*  CREATININE 1.17  --  1.13 1.16   < > = values in this interval not displayed.    Estimated Creatinine Clearance: 56 mL/min (by C-G formula based on SCr of 1.16 mg/dL).   Medical History: Past Medical History:  Diagnosis Date  . Anemia   . Anemia due to medication 09/24/2015  . Arthritis    "feet" (11/19/2015)  . CAD (coronary artery disease), native coronary artery    a. remote LAD stent 1990s. b. s/p DESx2 to LAD 11/2015  . Hepatitis A immune 11/20/2016  . Hepatitis B immune 11/20/2016  . Hepatitis C    "going thru the treatments" (11/19/2015)  . High cholesterol   . History of gout    "just recently" (11/19/2015)  . HIV (human immunodeficiency virus infection) ([place])   . Hypertension   . Posterior pain of left hip 03/01/2018  . Type II diabetes mellitus ([place])    diagnosed 04-2015     Assessment: 15 yoM admitted with jaundice and hypercalcemia. VTE ppx has been held since 12/3 for procedures, now to resume subQ heparin tonight at 1700. CBC low but stable.    Plan:  Heparin 5000 units SQ q8h starting 12/6 1700 Pharmacy will sign off, reconsult if needed   Arrie Senate, PharmD, BCPS, [facility] Clinical Pharmacist [phone] Please check AMION for all [place] numbers 05/24/2020

## 2020-05-24 NOTE — Discharge Summary (Signed)
Name: John Dickerson MRN: [id number] DOB: 12-17-1960 59 y.o. PCP: Jean Rosenthal, MD  Date of Admission: 05/19/2020  4:53 PM Date of Discharge: 05/27/2020 Attending Physician: Dr. 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   amLODipine 10 MG tablet Commonly known as: NORVASC Take 1 tablet (10 mg total) by mouth daily.   aspirin EC 81 MG tablet Take 1 tablet (81 mg total) by mouth daily.   atorvastatin 40 MG tablet Commonly known as: LIPITOR TAKE 1 TABLET BY MOUTH DAILY AT 6PM What changed: See the new instructions.   Biktarvy 50-200-25 MG Tabs tablet Generic drug: bictegravir-emtricitabine-tenofovir AF TAKE 1 TABLET BY MOUTH DAILY   blood glucose meter kit and supplies Kit Dispense based on patient and insurance preference. Use up to four times daily as directed. (FOR ICD-9 250.00, 250.01).   dexamethasone 4 MG tablet Commonly known as: DECADRON Take 1 tablet (4 mg total) by mouth 2 (two) times daily for 14 days.   FreeStyle [place] 2 Reader Systm Devi 1 Device by Does not apply route 3 (three) times daily.   FreeStyle Libre 2 Sensor Systm Misc 1 Device by Does not apply route 3 (three) times daily.   glucose blood test strip Commonly known as: OneTouch Verio Check blood sugar up to 1 time a day   losartan 50 MG tablet Commonly known as: COZAAR Take 1 tablet (50 mg  total) by mouth daily.   metFORMIN 1000 MG tablet Commonly known as: GLUCOPHAGE TAKE 1 TABLET BY MOUTH TWICE DAILY WITH A MEAL What changed:   how much to take  how to take this  when to take this  additional instructions   metoprolol succinate 100 MG 24 hr tablet Commonly known as: TOPROL-XL TAKE 1 TABLET BY MOUTH DAILY WITH OR IMMEDAITELY FOLLOWING A MEAL What changed:   how much to take  how to take this  when to take this  additional instructions   multivitamin with minerals Tabs tablet Take 1 tablet by mouth daily.   nitroGLYCERIN 0.4 MG SL tablet Commonly known as: NITROSTAT Place 1 tablet (0.4 mg total) under the tongue every 5 (five) minutes as needed for chest pain.   OneTouch Delica Lancets Fine Misc Check blood sugar up to 1 time a day   OneTouch Verio Flex System w/Device Kit 1 each by Does not apply route daily.   Vitamin D3 25 MCG tablet Commonly known as: Vitamin D Take 1 tablet (1,000 Units total) by mouth daily.            Discharge Care Instructions  (From admission, onward)         Start     Ordered   05/27/20 0000  Change dressing (specify)       Comments: Flush drain daily   05/27/20 1604          Disposition and follow-up:   John Dickerson  John Dickerson was discharged from [facility] in Stable condition.  At the hospital follow up visit please address:  1.  Follow-up:  A. 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 HCC vs. metastatic cancer to the liver. Please check GI symptoms and ensure he followed up with oncology    B. Hypercalcemia: He had Ca2+ of 14 on admission which improved to 9.3 on discharge. It was starting to trend up again so monitor with a CMP. Commence bisphosphonate therapy if calcium still elevated.   C. Normocytic anemia: Patient developed acute anemia with hgb of 7.8 on discharge. Check for signs of bleeding and  monitor with a CBC.   D. Hyperglycemia: Patient was started on steroids at discharge to help stabilize hypercalcemia (per Oncology). Please check his blood sugar. Discontinue steroids and start a diabetic agent if needed.  2.  Labs / imaging needed at time of follow-up: CMP, CBC  3.  Pending labs/ test needing follow-up: None  Follow-up Appointments:   December 14th at 2:05 pm with oncology  Hospital Course by problem list: Painless jaundice/Biliary carcinoma Patient with recent history of acalculous cholecystitis and drain placement to relieved the obstruction 1 month ago. Presented with unintentional weight loss, poor appetite, fatigue and jaundice. Lab findings was concerning for pancreatic malignancy. MRCP was done and showed masslike area in the central liver obstructing the bile ducts, common hepatic duct and narrowing the portal vein at biliary confluence a highly suspicious for aggressive biliary neoplasm vs cholangiocarcinoma vs gallbladder carcinoma. GI was consulted and they performed EUS/ERCP with biopsy.  brushings were taken for cytology/pathology and 2 plastic stents placed into left hepatic duct system with good resulting drainage. LTFs continued to improve after stent placement. He was started on Ciprofloxacin 500 mg for 5 days and put on Protonix 40 mg daily. CA-19-9 was elevated to 753. 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 A CT abd/pel was recommended by GI and it confirmed ill-defined infiltrating neoplastic process in the central liver inferiorly and possible partial thrombosis of the middle and right portal veins. Oncology was consulted and they ordered a CT chest to assess for possible lung malignancy. CT chest was negatuive for lung mass. Oncology will follow up with patient on December 14th. GI will arrange ERCP with elective biliary stent exchange in a few months as  outpatient   Hypercalcemia Patient found to have an elevated calcium of 14.2 in clinic. He was given Calcitonin x1 dose and net +5 liters during admission with good urine output. Vitamin D, calcitriol, and PTH were all low. PTHrP was normal. Calcium dropped to 8.6 but increased to 9.3 on day of discharge. Oncology recommended steroids to help stabilize hypercalcemia so patient was discharged on Dexamethasone 4 mg BID. He will need lab check and glucose monitoring. A1c is 6.4 and he is only on metformin.    HTN Patient found to have soft BP with systolic in the 36U on arrival. He was given 3 L of NS bolus and an additional 3 L IVNS during hospitalization. All his anti-hypertensives were held on admission. BP on day of discharge was 137/68. His home medications were restarted at discharge.   Normocytic Anemia Patient's hgb gradually trended down from 11.5 on admission to 7.8 at discharge. There were no signs of active bleeding. He denied any hematuria, hemoptysis or bloody stools. Likely anemia of chronic disease. FOBT was  negative. Will need CBC recheck in about a week after discharge.    HIV Chronic and stable. Viral load undetectable. Held [place] for a few days due to hepatic dysfunction and started it after improvements from his LFTs. Will follow up with ID doc.     Discharge Vitals:   BP 109/83 (BP Location: Right Arm)   Pulse 80   Temp (!) 97.4 F (36.3 C) (Oral)   Resp 16   Ht _0  (1.626 m)   Wt 67.6 kg   SpO2 100%   BMI 25.58 kg/m   Pertinent Labs, Studies, and Procedures:  CBC Latest Ref Rng & Units 05/24/2020 05/23/2020 05/22/2020  WBC 4.0 - 10.5 K/uL 12.3(H) 11.4(H) 15.0(H)  Hemoglobin 13.0 - 17.0 g/dL 8.1(L) 8.4(L) 9.7(L)  Hematocrit 39 - 52 % 23.7(L) 24.2(L) 28.3(L)  Platelets 150 - 400 K/uL 535(H) 535(H) 575(H)    CMP Latest Ref Rng & Units 05/24/2020 05/23/2020 05/22/2020  Glucose 70 - 99 mg/dL 163(H) 185(H) 111(H)  BUN 8 - 23 mg/dL _1 Creatinine 0.61 - 1.24  mg/dL 1.16 1.13 1.17  Sodium 135 - 145 mmol/L 132(L) 132(L) 130(L)  Potassium 3.5 - 5.1 mmol/L 3.5 3.9 3.5  Chloride 98 - 111 mmol/L 103 105 103  CO2 22 - 32 mmol/L 19(L) 18(L) 16(L)  Calcium 8.9 - 10.3 mg/dL 9.0 10.1 10.7(H)  Total Protein 6.5 - 8.1 g/dL 5.9(L) 6.1(L) 6.3(L)  Total Bilirubin 0.3 - 1.2 mg/dL 4.5(H) 8.4(H) 9.7(H)  Alkaline Phos 38 - 126 U/L 793(H) 1,061(H) 1,266(H)  AST 15 - 41 U/L 78(H) 109(H) 126(H)  ALT 0 - 44 U/L 72(H) 86(H) 96(H)    MR ABDOMEN MRCP W WO CONTAST  Result Date: 05/20/2020 CLINICAL DATA:  Painless jaundice in a 59 year old male EXAM: MRI ABDOMEN WITHOUT AND WITH CONTRAST (INCLUDING MRCP) TECHNIQUE: Multiplanar multisequence MR imaging of the abdomen was performed both before and after the administration of intravenous contrast. Heavily T2-weighted images of the biliary and pancreatic ducts were obtained, and three-dimensional MRCP images were rendered by post processing. CONTRAST:  6.34m GADAVIST GADOBUTROL 1 MMOL/ML IV SOLN COMPARISON:  CT abdomen and pelvis from September of 2021 FINDINGS: Lower chest: Lung bases are clear, no consolidation or sign of pleural effusion. Limited assessment of lung bases on MRI. Hepatobiliary: Masslike appearance of the liver and a portion of the gallbladder. Obstruction of the biliary tree at the porta hepatis. Is soft tissue with enhancement extends to the porta hepatis obstructing the biliary tree. And irregular masslike area with irregular peripheral enhancement extends from the gallbladder fossa into the porta hepatis where the bile duct shows transition. Biliary drainage catheter remains in place. This area measures 7.9 x 6.7 cm in greatest axial dimension with another area in hepatic subsegment IV that measures 2.9 x 2.3 cm. Enhancement along the margin posteriorly with soft tissue measuring 3.5 x 2.3 cm on image 46 of series 30. Profound biliary duct distension. T1 signal with mild mixed signal in the area and nonenhancing  central portion of this large area extending into the central liver. Portal vein is narrowed at the biliary confluence. This is near the site of biliary duct transition no additional hepatic lesion. FPilar Plateenhancing soft tissue just above the pancreatic head is seen on image 48 of series 32 Pancreas:  No sign of pancreatic inflammation or ductal dilation. Spleen:  Normal Adrenals/Urinary Tract: Adrenal glands are normal. Kidneys with smooth contours. No hydronephrosis. Stomach/Bowel: Gastrointestinal tract without acute process. No acute gastrointestinal process  to the extent evaluated. Limited assessment on MRI. Vascular/Lymphatic: Normal caliber abdominal aorta. There is no gastrohepatic or hepatoduodenal ligament lymphadenopathy. No retroperitoneal or mesenteric lymphadenopathy. Other:  Small volume ascites about the liver. Musculoskeletal: No acute musculoskeletal process. No destructive bone finding on limited assessment. IMPRESSION: 1. Masslike area in the central liver obstructing bile ducts, common hepatic duct and narrowing the portal vein extending from the gallbladder fossa a highly suspicious for aggressive biliary neoplasm/cholangiocarcinoma/gallbladder carcinoma. Superimposed infection is also possible. 2. Pancreas is largely normal but inseparable from enhancing soft tissue that extends from the porta hepatis inferiorly and with irregular enhancement that surrounds an area of presumed necrotic tumor and or infected necrotic debris. 3. Separate smaller area showing heterogeneous enhancement likely reflects additional site of tumor in the liver in the inferior medial LEFT hepatic lobe. 4. Small volume ascites about the liver. 5. Biliary drainage tube appears to remain in place, not well evaluated except on MRCP sequences were a can be faintly seen. Electronically Signed   By: Zetta Bills M.D.   On: 05/20/2020 17:55     Discharge Instructions:   John Dickerson,   It was a pleasure taking care of  you at [facility]. We have treated your elevated calcium and the rest of your labs have improved significantly. We are discharging you home to follow up with your primary care doctor and oncology next week.   1) Start taking vitamin D 1000 units daily 2) Start taking Dexamethasone 4 mg twice daily. This can increase your blood sugar so make sure to check it daily  Take care, Dr. Linwood Dibbles  Signed: Lacinda Axon, MD 05/24/2020, 7:34 AM   Pager: [phone]

## 2020-05-25 ENCOUNTER — Other Ambulatory Visit: Payer: Self-pay | Admitting: Physician Assistant

## 2020-05-25 LAB — COMPREHENSIVE METABOLIC PANEL
ALT: 62 U/L — ABNORMAL HIGH (ref 0–44)
AST: 64 U/L — ABNORMAL HIGH (ref 15–41)
Albumin: 1.5 g/dL — ABNORMAL LOW (ref 3.5–5.0)
Alkaline Phosphatase: 758 U/L — ABNORMAL HIGH (ref 38–126)
Anion gap: 9 (ref 5–15)
BUN: 13 mg/dL (ref 8–23)
CO2: 24 mmol/L (ref 22–32)
Calcium: 8.6 mg/dL — ABNORMAL LOW (ref 8.9–10.3)
Chloride: 100 mmol/L (ref 98–111)
Creatinine, Ser: 1.24 mg/dL (ref 0.61–1.24)
GFR, Estimated: >60 mL/min (ref >60)
Glucose, Bld: 176 mg/dL — ABNORMAL HIGH (ref 70–99)
Potassium: 3.6 mmol/L (ref 3.5–5.1)
Sodium: 133 mmol/L — ABNORMAL LOW (ref 135–145)
Total Bilirubin: 4 mg/dL — ABNORMAL HIGH (ref 0.3–1.2)
Total Protein: 5.7 g/dL — ABNORMAL LOW (ref 6.5–8.1)

## 2020-05-25 LAB — CBC
HCT: 22.8 % — ABNORMAL LOW (ref 39.0–52.0)
Hemoglobin: 8 g/dL — ABNORMAL LOW (ref 13.0–17.0)
MCH: 29.7 pg (ref 26.0–34.0)
MCHC: 35.1 g/dL (ref 30.0–36.0)
MCV: 84.8 fL (ref 80.0–100.0)
Platelets: 505 10*3/uL — ABNORMAL HIGH (ref 150–400)
RBC: 2.69 MIL/uL — ABNORMAL LOW (ref 4.22–5.81)
RDW: 20.4 % — ABNORMAL HIGH (ref 11.5–15.5)
WBC: 12.7 10*3/uL — ABNORMAL HIGH (ref 4.0–10.5)
nRBC: 0 % (ref 0.0–0.2)

## 2020-05-25 LAB — HSV(HERPES SIMPLEX VRS) I + II AB-IGM: HSVI/II Comb IgM: <0.91 ratio (ref 0.00–0.90)

## 2020-05-25 LAB — PTH-RELATED PEPTIDE: PTH-related peptide: <2 pmol/L

## 2020-05-25 MED ORDER — BICTEGRAVIR-EMTRICITAB-TENOFOV 50-200-25 MG PO TABS
1.0000 | ORAL_TABLET | Freq: Every day | ORAL | Status: DC
  Administered 2020-05-25 – 2020-05-27 (×3): 1 via ORAL
  Filled 2020-05-25 (×3): qty 1

## 2020-05-25 NOTE — Progress Notes (Addendum)
Patient's temp is 100.8 oral. Notified MD via phone. Received orders to recheck temp at about 1600 and notify MD of result.  Temp 100.7 on recheck. Notified Dr. Coy Saunas; no new medication orders at this time. Plan to continue to monitor temp & continue with patient's antibiotics.

## 2020-05-25 NOTE — Plan of Care (Signed)
  Problem: Education: Goal: Knowledge of General Education information will improve Description: Including pain rating scale, medication(s)/side effects and non-pharmacologic comfort measures Outcome: Progressing   Problem: Health Behavior/Discharge Planning: Goal: Ability to manage health-related needs will improve Outcome: Progressing   Problem: Clinical Measurements: Goal: Ability to maintain clinical measurements within normal limits will improve Outcome: Progressing Goal: Will remain free from infection Outcome: Progressing Goal: Diagnostic test results will improve Outcome: Progressing Goal: Respiratory complications will improve Outcome: Progressing Goal: Cardiovascular complication will be avoided Outcome: Progressing   Problem: Activity: Goal: Risk for activity intolerance will decrease Outcome: Progressing   Problem: Nutrition: Goal: Adequate nutrition will be maintained Outcome: Progressing   Problem: Coping: Goal: Level of anxiety will decrease Outcome: Progressing   Problem: Elimination: Goal: Will not experience complications related to bowel motility Outcome: Progressing Goal: Will not experience complications related to urinary retention Outcome: Progressing   Problem: Pain Managment: Goal: General experience of comfort will improve Outcome: Progressing   Problem: Safety: Goal: Ability to remain free from injury will improve Outcome: Progressing   Problem: Skin Integrity: Goal: Risk for impaired skin integrity will decrease Outcome: Progressing   Problem: Nutrition Goal: Patient maintains adequate hydration Outcome: Progressing Goal: Patient maintains weight Outcome: Progressing Goal: Patient/Family demonstrates understanding of diet Outcome: Progressing Goal: Patient/Family independently completes tube feeding Outcome: Progressing Goal: Patient will have no more than 5 lb weight change during LOS Outcome: Progressing Goal: Patient will  utilize adaptive techniques to administer nutrition Outcome: Progressing Goal: Patient will verbalize dietary restrictions Outcome: Progressing   

## 2020-05-25 NOTE — Plan of Care (Signed)

## 2020-05-25 NOTE — Progress Notes (Signed)
Subjective:   Hospital day: 6  Overnight event: NAEOV.   This AM, His vitals continue to be stable. LFTs are trending down. Patient reports sleeping well and improving appetite. No new complaints. Has been having small beige bowel movement.   Objective:  Vital signs in last 24 hours: Vitals:   05/23/20 2033 05/23/20 2034 05/24/20 0900 05/24/20 1936  BP: 109/83  129/66 135/66  Pulse:   77 88  Resp: 16  18 18   Temp: (!) 97.4 F (36.3 C)  97.8 F (36.6 C) 99.7 F (37.6 C)  TempSrc: Oral  Oral Oral  SpO2:  100% 100% 97%  Weight:      Height:        Physical Exam  General: Pleasant, ill-appearing elderly man laying in bed. No acute distress. Head: Normocephalic. Atraumatic. HEENT: Sclera icterus improved. MMM. CV: RRR. No murmurs, rubs, or gallops. No LE edema Pulmonary: Lungs CTAB. Normal effort. No wheezing or rales. Abdominal: Soft, nontender, nondistended. Normal bowel sounds. Perc cholecystostomy draining bloody fluids. Extremities: Palpable pulses. Normal ROM. Skin: Warm and dry. No obvious rash or lesions. Jaundice resolved. Neuro: A&Ox3. Moves all extremities. Normal sensation. No focal deficit. Psych: Normal mood and affect  Assessment/Plan: John Dickerson is a 59 y.o. male with history of well-controlled HIV, HTN, CAD, recent diagnosis of acalculous cholecystitis s/p percutaneous drain placement who presents with progressive weight loss, fatigue, and jaundice and undergoing further work up painless jaundice and hypercalcemia. Found to have a masslike structure in the liver causing duct obstruction and narrowing of the PV.   Principal Problem:   Painless jaundice Active Problems:   Hypercalcemia   Protein-calorie malnutrition, severe   Elevated liver enzymes   Elevated alkaline phosphatase level   Loss of weight  Painless jaundice  Presented with unintentional weight loss, poor appetite, and jaundice and found to have cholestatic pattern on CMP with  significantly elevated alkaline phos. He is s/p MRCP showing bilary mass concerning for cholangiocarcinoma. GI consulted and performed EUS/ERCP with biopsy and brushings taken for cytology/pathology. 2 plastic stents placed into left hepatic duct system with good resulting drainage. LTFs continue to improve after stent placement. CA-19-9 elevated to 753. This AM, patient is doing well. Had a beige-color BM this AM.  --Appreciate GI's assistance co-managing this patient --Trend liver enzymes and watch for pancreatitis, bleeding, and/or perforation.  --Pending path/cyto results  --Continue ciprofloxacin 500 mg BID x 5 days for hilar stenosis (day 3 of 5) --Start PO Protonix 40 mg daily --If pathology is negative, GI recommends VIR for percutaneous biopsy --Daily CMP --Drain care --Consider starting home meds w/ improvement in labs  Hypercalcemia Patient found to have an elevated calcium of 14.2 in clinic. He is s/p 1x Calcitonin and net + 5 liters since admission with good urine output. Vitamin D, calcitriol, and PTH are low. PTHrp pending. Ca now at 8.6 with a corrected Ca of 10.6. Continues to deny associated symptoms. --F/u PTHrPep --Daily CMP  HTN Patient found to have soft BP with systolic in the 89F on arrival. S/p 3 L of NS bolus. BP is 123/70 this morning. Will monitor closely. --Holding home anti-hypertensives --Daily vitals  HIV Chronic and stable. On viral load undetectable on Biktarvy. Holding [place] for now due to severe hepatic impairment Child-Pugh score of 10 (Class C). --Will follow up with ID doc  Diet: Regular IVF: None VTE: Heparin 5000 units subcu q8h CODE: Full Code  Prior to Admission Living Arrangement: Home Anticipated Discharge Location:  Home Barriers to Discharge: Medical workup Dispo: Anticipated discharge in approximately 1-2 day(s).   Signed: Lacinda Axon, MD 05/25/2020, 6:32 AM  Pager: [phone] Internal Medicine Teaching Service After  5pm on weekdays and 1pm on weekends: On Call pager: [phone]

## 2020-05-26 DIAGNOSIS — D649 Anemia, unspecified: Secondary | ICD-10-CM

## 2020-05-26 DIAGNOSIS — R17 Unspecified jaundice: Secondary | ICD-10-CM | POA: Diagnosis not present

## 2020-05-26 LAB — CBC
HCT: 21.2 % — ABNORMAL LOW (ref 39.0–52.0)
Hemoglobin: 7.5 g/dL — ABNORMAL LOW (ref 13.0–17.0)
MCH: 30.1 pg (ref 26.0–34.0)
MCHC: 35.4 g/dL (ref 30.0–36.0)
MCV: 85.1 fL (ref 80.0–100.0)
Platelets: 505 10*3/uL — ABNORMAL HIGH (ref 150–400)
RBC: 2.49 MIL/uL — ABNORMAL LOW (ref 4.22–5.81)
RDW: 20.3 % — ABNORMAL HIGH (ref 11.5–15.5)
WBC: 12.3 10*3/uL — ABNORMAL HIGH (ref 4.0–10.5)
nRBC: 0 % (ref 0.0–0.2)

## 2020-05-26 LAB — COMPREHENSIVE METABOLIC PANEL
ALT: 56 U/L — ABNORMAL HIGH (ref 0–44)
AST: 61 U/L — ABNORMAL HIGH (ref 15–41)
Albumin: 1.6 g/dL — ABNORMAL LOW (ref 3.5–5.0)
Alkaline Phosphatase: 637 U/L — ABNORMAL HIGH (ref 38–126)
Anion gap: 10 (ref 5–15)
BUN: 16 mg/dL (ref 8–23)
CO2: 24 mmol/L (ref 22–32)
Calcium: 8.8 mg/dL — ABNORMAL LOW (ref 8.9–10.3)
Chloride: 99 mmol/L (ref 98–111)
Creatinine, Ser: 1.16 mg/dL (ref 0.61–1.24)
GFR, Estimated: >60 mL/min (ref >60)
Glucose, Bld: 156 mg/dL — ABNORMAL HIGH (ref 70–99)
Potassium: 3.4 mmol/L — ABNORMAL LOW (ref 3.5–5.1)
Sodium: 133 mmol/L — ABNORMAL LOW (ref 135–145)
Total Bilirubin: 3.8 mg/dL — ABNORMAL HIGH (ref 0.3–1.2)
Total Protein: 5.8 g/dL — ABNORMAL LOW (ref 6.5–8.1)

## 2020-05-26 LAB — CYTOLOGY - NON PAP

## 2020-05-26 LAB — OCCULT BLOOD X 1 CARD TO LAB, STOOL: Fecal Occult Bld: NEGATIVE

## 2020-05-26 MED ORDER — ENSURE ENLIVE PO LIQD
237.0000 mL | ORAL | Status: DC
  Administered 2020-05-27: 237 mL via ORAL

## 2020-05-26 MED ORDER — POTASSIUM CHLORIDE CRYS ER 20 MEQ PO TBCR
20.0000 meq | EXTENDED_RELEASE_TABLET | Freq: Once | ORAL | Status: AC
  Administered 2020-05-26: 20 meq via ORAL
  Filled 2020-05-26: qty 1

## 2020-05-26 NOTE — Progress Notes (Signed)
Addendum: I am ordering repeat labs tomorrow. If labs are worse, I recommend we pursue CT imaging of chest. If pathologist unable to delineate subtype further, he might need repeat liver biopsy I will follow tomorrow

## 2020-05-26 NOTE — Consult Note (Signed)
John Dickerson CONSULT NOTE  Patient Care Team: John Rosenthal, MD as PCP - General John Sima Doroteo Bradford, MD as PCP - Infectious Diseases (Infectious Diseases) John Booze, MD as PCP - Cardiology (Cardiology) John Dickerson, John Dickerson, [name] as Consulting Physician (Optometry)  ASSESSMENT & PLAN:  Adenocarcinoma of the biliary tract, suspicious for cholangiocarcinoma versus HCC versus metastatic cancer to the liver The elevated CA 19-9 could be related to obstructive jaundice Given his prior history of hepatitis C, I will order alpha-fetoprotein He could benefit from CT imaging of the chest to rule out primary lung cancer that could have progressed/metastasized to the liver although the appearance on imaging studies is highly suspicious for primary biliary tract malignancy I will call the pathologist tomorrow to get additional stains ordered The patient is very withdrawn I will hold off ordering CT imaging right now  Malignant hypercalcemia He has received calcitonin injection His calcium level has improved If his calcium level start to trend up again, the patient may benefit from steroid therapy although it could make blood sugar control difficult  Severe anemia Component of anemia chronic disease versus bleeding I recommend blood transfusion if hemoglobin start to trend down further  HIV infection He will continue antiretroviral therapy  Obstructive jaundice With percutaneous drainage, his bilirubin has improved I would defer to GI service for further management  Goals of care discussion The patient is very withdrawn He does not want me to share his diagnosis with his wife Given the location and multiple masses, this is not treatable by surgery or radiation Treatment would be predominantly chemotherapy He did ask prognosis I estimate prognosis to be 1-2 years, depending on response to therapy   All questions were answered. The patient knows to call the clinic with any  problems, questions or concerns. No barriers to learning was detected.  John Lark, MD 12/8/20213:02 PM  CHIEF COMPLAINTS/PURPOSE OF CONSULTATION:  Poorly differentiated adenocarcinoma, suspicious for primary biliary tract cancer  HISTORY OF PRESENTING ILLNESS:  John Dickerson 59 y.o. male is seen at the request by primary service The patient has remote history of hepatitis C He is taking chronic antiretroviral treatment for HIV infection. He has recurrent hospitalization over the past few months with progressive weight loss due to poor appetite He had CT imaging of the abdomen, MRI, MRCP, ultrasound and significant evaluation by GI service He is noted to have abnormal LFTs and intermittent hypercalcemia with signs of progression of obstructive jaundice He was diagnosed with acalculous cholecystitis and underwent IR drainage placement in September Since discharge from the hospital, he has not been feeling well with significant abdominal cramping and progressive weight loss. In the recent outpatient follow-up, he was noted to have significant jaundice and hypotensive He was admitted to the hospital and was found to have severe hypercalcemia and obstructive jaundice with mild acute renal failure He had repeat imaging study with MRI of the abdomen on 05/20/20 which showed: 1. Masslike area in the central liver obstructing bile ducts, common hepatic duct and narrowing the portal vein extending from the gallbladder fossa a highly suspicious for aggressive biliary neoplasm/cholangiocarcinoma/gallbladder carcinoma. Superimposed infection is also possible. 2. Pancreas is largely normal but inseparable from enhancing soft tissue that extends from the porta hepatis inferiorly and with irregular enhancement that surrounds an area of presumed necrotic tumor and or infected necrotic debris. 3. Separate smaller area showing heterogeneous enhancement likely reflects additional site of tumor in the liver  in the inferior medial LEFT hepatic lobe.  4. Small volume ascites about the liver. 5. Biliary drainage tube appears to remain in place, not well evaluated except on MRCP sequences were a can be faintly seen.  On May 22, 2020, he underwent ERCP, endoscopic ultrasound and biopsy, report showed  - The major papilla appeared normal. - A single severe biliary stricture was found in the common hepatic duct. The stricture was indeterminate. This was eventually dilated. This was brushed for cytology purposes. - The left main hepatic duct and left intrahepatic branches were severely dilated, with an obstruction due to the aforementioned stenosis. - The right hepatic duct system could not be visualized. - The gallbladder and cystic duct was not visualized. - A biliary sphincterotomy was performed. - The biliary tree was swept and sludge and clots and presumed tissue were found. Tissue/clots sent to pathology for review. - Two plastic stents were placed into the left hepatic duct system with good drainage obtained.  Pathology from May 22, 2020 showed  A. DUODENUM, BIOPSY:  - Duodenal mucosa with no significant pathologic findings.  - Negative for increased intraepithelial lymphocytes and villous architectural changes.   B. STOMACH, BIOPSY:  - Gastric antral mucosa with mild reactive gastropathy.  - Gastric oxyntic mucosa with mild chronic gastritis.  - Warthin-Starry stain is negative for Helicobacter pylori.   C. BILIARY TISSUE, BIOPSY:  - Poorly differentiated carcinoma, favor adenocarcinoma.   His recent CA 19-9 was elevated but alpha-fetoprotein was not done There was no mention about liver cirrhosis on recent imaging His last hepatitis C RNA was undetected in March 2018  He was told by primary service about diagnosis of malignancy I requested the diagnosis to be kept a secret from family He denies pain.  No nausea or recent changes in bowel habits  MEDICAL HISTORY:  Past Medical  History:  Diagnosis Date  . Anemia   . Anemia due to medication 09/24/2015  . Arthritis    "feet" (11/19/2015)  . CAD (coronary artery disease), native coronary artery    a. remote LAD stent 1990s. b. s/p DESx2 to LAD 11/2015  . Hepatitis A immune 11/20/2016  . Hepatitis B immune 11/20/2016  . Hepatitis C    "going thru the treatments" (11/19/2015)  . High cholesterol   . History of gout    "just recently" (11/19/2015)  . HIV (human immunodeficiency virus infection) ([place])   . Hypertension   . Posterior pain of left hip 03/01/2018  . Type II diabetes mellitus ([place])    diagnosed 04-2015    SURGICAL HISTORY: Past Surgical History:  Procedure Laterality Date  . BILIARY BRUSHING  05/22/2020   Procedure: BILIARY BRUSHING;  Surgeon: Rush Landmark Telford Nab., MD;  Location: [facility];  Service: Gastroenterology;;  . BILIARY DILATION  05/22/2020   Procedure: BILIARY DILATION;  Surgeon: Irving Copas., MD;  Location: [facility];  Service: Gastroenterology;;  . BILIARY STENT PLACEMENT  05/22/2020   Procedure: BILIARY STENT PLACEMENT;  Surgeon: Irving Copas., MD;  Location: [facility];  Service: Gastroenterology;;  . BIOPSY  05/22/2020   Procedure: BIOPSY;  Surgeon: Irving Copas., MD;  Location: [facility];  Service: Gastroenterology;;  . CARDIAC CATHETERIZATION N/A 11/19/2015   Procedure: Left Heart Cath and Coronary Angiography;  Surgeon: Jolaine Artist, MD;  Location: [facility];  Service: Cardiovascular;  Laterality: N/A;  . CARDIAC CATHETERIZATION N/A 11/19/2015   Procedure: Coronary Stent Intervention;  Surgeon: John Booze, MD;  Location: [facility];  Service: Cardiovascular;  Laterality: N/A;  .  CORONARY ANGIOPLASTY WITH STENT PLACEMENT  11/19/2015   "2 stents"  . ENDOSCOPIC RETROGRADE CHOLANGIOPANCREATOGRAPHY (ERCP) WITH PROPOFOL N/A 05/22/2020   Procedure: ENDOSCOPIC RETROGRADE CHOLANGIOPANCREATOGRAPHY (ERCP) WITH PROPOFOL;  Surgeon:  Rush Landmark Telford Nab., MD;  Location: [facility];  Service: Gastroenterology;  Laterality: N/A;  . ESOPHAGOGASTRODUODENOSCOPY (EGD) WITH PROPOFOL N/A 05/22/2020   Procedure: ESOPHAGOGASTRODUODENOSCOPY (EGD) WITH PROPOFOL;  Surgeon: Rush Landmark Telford Nab., MD;  Location: [facility];  Service: Gastroenterology;  Laterality: N/A;  . IR CHOLANGIOGRAM EXISTING TUBE  03/23/2020  . IR EXCHANGE BILIARY DRAIN  04/22/2020  . IR PERC CHOLECYSTOSTOMY  03/06/2020  . IR RADIOLOGIST EVAL & MGMT  04/06/2020  . IR RADIOLOGIST EVAL & MGMT  04/20/2020  . other     unknown abdominal surgery in childhood  . REMOVAL OF STONES  05/22/2020   Procedure: REMOVAL OF STONES;  Surgeon: Rush Landmark Telford Nab., MD;  Location: [facility];  Service: Gastroenterology;;  . Joan Mayans  05/22/2020   Procedure: Joan Mayans;  Surgeon: Irving Copas., MD;  Location: [facility];  Service: Gastroenterology;;  . UPPER ESOPHAGEAL ENDOSCOPIC ULTRASOUND (EUS) N/A 05/22/2020   Procedure: UPPER ESOPHAGEAL ENDOSCOPIC ULTRASOUND (EUS);  Surgeon: Irving Copas., MD;  Location: [facility];  Service: Gastroenterology;  Laterality: N/A;    SOCIAL HISTORY: Social History   Socioeconomic History  . Marital status: Legally Separated    Spouse name: Not on file  . Number of children: Not on file  . Years of education: Not on file  . Highest education level: Not on file  Occupational History  . Not on file  Tobacco Use  . Smoking status: Former Smoker    Packs/day: 0.12    Years: 49.00    Pack years: 5.88    Types: Cigarettes    Start date: 07/20/2012    Quit date: 02/18/2015    Years since quitting: 5.2  . Smokeless tobacco: Never Used  Vaping Use  . Vaping Use: Never used  Substance and Sexual Activity  . Alcohol use: Not Currently    Alcohol/week: 6.0 standard drinks    Types: 6 Standard drinks or equivalent per week  . Drug use: No  . Sexual activity: Yes    Partners: Female  Other Topics Concern   . Not on file  Social History Narrative  . Not on file   Social Determinants of Health   Financial Resource Strain:   . Difficulty of Paying Living Expenses: Not on file  Food Insecurity:   . Worried About Charity fundraiser in the Last Year: Not on file  . Ran Out of Food in the Last Year: Not on file  Transportation Needs:   . Lack of Transportation (Medical): Not on file  . Lack of Transportation (Non-Medical): Not on file  Physical Activity:   . Days of Exercise per Week: Not on file  . Minutes of Exercise per Session: Not on file  Stress:   . Feeling of Stress : Not on file  Social Connections:   . Frequency of Communication with Friends and Family: Not on file  . Frequency of Social Gatherings with Friends and Family: Not on file  . Attends Religious Services: Not on file  . Active Member of Clubs or Organizations: Not on file  . Attends Archivist Meetings: Not on file  . Marital Status: Not on file  Intimate Partner Violence:   . Fear of Current or Ex-Partner: Not on file  . Emotionally Abused: Not on file  . Physically Abused: Not  on file  . Sexually Abused: Not on file    FAMILY HISTORY: Family History  Problem Relation Age of Onset  . Diabetes Mother   . Cancer Mother        unkown type  . Hypertension Father   . Colon cancer Neg Hx     ALLERGIES:  has No Known Allergies.  MEDICATIONS:  Current Facility-Administered Medications  Medication Dose Route Frequency Provider Last Rate Last Admin  . bictegravir-emtricitabine-tenofovir AF (BIKTARVY) 50-200-25 MG per tablet 1 tablet  1 tablet Oral Daily [place], Carley D, DO   1 tablet at 05/26/20 0843  . ciprofloxacin (CIPRO) tablet 500 mg  500 mg Oral BID Marianna Payment, MD   500 mg at 05/26/20 0843  . [START ON 05/27/2020] feeding supplement (ENSURE ENLIVE / ENSURE PLUS) liquid 237 mL  237 mL Oral Q24H Joni Reining C, DO      . heparin injection 5,000 Units  5,000 Units Subcutaneous Q8H Einar Grad, RPH   5,000 Units at 05/26/20 0542  . indomethacin (INDOCIN) 50 MG suppository 100 mg  100 mg Rectal Once Gribbin, Sarah J, PA-C      . pantoprazole (PROTONIX) EC tablet 40 mg  40 mg Oral Daily Willia Craze, NP   40 mg at 05/26/20 4132    REVIEW OF SYSTEMS:   Constitutional: Denies fevers, chills or abnormal night sweats Eyes: Denies blurriness of vision, double vision or watery eyes Ears, nose, mouth, throat, and face: Denies mucositis or sore throat Respiratory: Denies cough, dyspnea or wheezes Cardiovascular: Denies palpitation, chest discomfort or lower extremity swelling Gastrointestinal:  Denies nausea, heartburn or change in bowel habits Skin: Denies abnormal skin rashes Lymphatics: Denies new lymphadenopathy or easy bruising Neurological:Denies numbness, tingling or new weaknesses Behavioral/Psych: Mood is stable, no new changes  All other systems were reviewed with the patient and are negative.  PHYSICAL EXAMINATION: ECOG PERFORMANCE STATUS: 2 - Symptomatic, <50% confined to bed  Vitals:   05/25/20 2100 05/26/20 1442  BP: 129/63   Pulse: 80   Resp: 18   Temp: 99.6 F (37.6 C) 100 F (37.8 C)  SpO2: 98%    Filed Weights   05/22/20 0530 05/22/20 1244 05/25/20 0638  Weight: 149 lb (67.6 kg) 149 lb 0.5 oz (67.6 kg) 155 lb 1.6 oz (70.4 kg)    GENERAL:alert, no distress and comfortable SKIN: skin color, texture, turgor are normal, no rashes or significant lesions EYES: normal, conjunctiva are pink and non-injected, sclera clear OROPHARYNX:no exudate, no erythema and lips, buccal mucosa, and tongue normal  NECK: supple, thyroid normal size, non-tender, without nodularity LYMPH:  no palpable lymphadenopathy in the cervical, axillary or inguinal LUNGS: clear to auscultation and percussion with normal breathing effort HEART: regular rate & rhythm and no murmurs and no lower extremity edema ABDOMEN:abdomen soft, non-tender and normal bowel sounds.  Noted  abdominal distention with drain on the right upper quadrant Musculoskeletal:no cyanosis of digits and no clubbing  PSYCH: alert & oriented x 3 with fluent speech NEURO: no focal motor/sensory deficits  LABORATORY DATA:  I have reviewed the data as listed Lab Results  Component Value Date   WBC 12.3 (H) 05/26/2020   HGB 7.5 (L) 05/26/2020   HCT 21.2 (L) 05/26/2020   MCV 85.1 05/26/2020   PLT 505 (H) 05/26/2020   Recent Labs    03/03/20 4401 03/09/20 0349 03/16/20 0949 03/17/20 1355 03/22/20 0929 03/25/20 0928 03/30/20 0938 05/19/20 1030 05/24/20 0350 05/25/20 0403 05/26/20 0342  NA   < >  134*   < > 136   < > 138 138   < > 132* 133* 133*  K   < > 4.2   < > 4.3   < > 5.6* 4.8   < > 3.5 3.6 3.4*  CL   < > 98   < > 101   < > 100 97   < > 103 100 99  CO2   < > 25   < > 25   < > 31 25   < > 19* 24 24  GLUCOSE   < > 120*   < > 91   < > 109* 126*   < > 163* 176* 156*  BUN   < > 17   < > 21   < > 26* 20   < > 17 13 16   CREATININE   < > 1.50*   < > 1.21   < > 1.24 1.10   < > 1.16 1.24 1.16  CALCIUM   < > 9.4   < > 10.4*   < > 11.2* 10.2  10.4*   < > 9.0 8.6* 8.8*  GFRNONAA   < > 50*   < > >60  --  62 72   < > >60 >60 >60  GFRAA  --  57*   < > >60  --  72 83  --   --   --   --   PROT   < > 7.9  --  7.7   < > 7.1 7.4   < > 5.9* 5.7* 5.8*  ALBUMIN   < > 2.5*  --  3.0*  --   --  4.2   < > 1.5* 1.5* 1.6*  AST   < > 47*  --  48*   < > 22 18   < > 78* 64* 61*  ALT   < > 73*  --  47*   < > 63* 29   < > 72* 62* 56*  ALKPHOS   < > 494*  --  197*  --   --  250*   < > 793* 758* 637*  BILITOT   < > 0.6  --  0.5   < > 0.4 0.2   < > 4.5* 4.0* 3.8*  BILIDIR  --  0.1  --   --   --   --   --   --   --   --   --   IBILI  --  0.5  --   --   --   --   --   --   --   --   --    < > = values in this interval not displayed.    RADIOGRAPHIC STUDIES: I have personally reviewed the radiological images as listed and agreed with the findings in the report. DG ERCP BILIARY & PANCREATIC DUCTS  Result  Date: 05/22/2020 CLINICAL DATA:  ERCP. EXAM: ERCP TECHNIQUE: Multiple spot images obtained with the fluoroscopic device and submitted for interpretation post-procedure. FLUOROSCOPY TIME:  Fluoroscopy Time:  8 minutes and 21 seconds. Number of Acquired Spot Images: 23 COMPARISON:  MRI dated 05/20/2020 FINDINGS: Cannulation of the common bile duct with injection of contrast demonstrates intrahepatic biliary ductal dilatation. Subsequent images demonstrate placement 2 plastic biliary stents. IMPRESSION: ERCP with stent placement as detailed above. These images were submitted for radiologic interpretation only. Please see the procedural report for the amount of contrast and the fluoroscopy time  utilized. Electronically Signed   By: Constance Holster M.D.   On: 05/22/2020 18:40   MR ABDOMEN MRCP W WO CONTAST  Result Date: 05/20/2020 CLINICAL DATA:  Painless jaundice in a 59 year old male EXAM: MRI ABDOMEN WITHOUT AND WITH CONTRAST (INCLUDING MRCP) TECHNIQUE: Multiplanar multisequence MR imaging of the abdomen was performed both before and after the administration of intravenous contrast. Heavily T2-weighted images of the biliary and pancreatic ducts were obtained, and three-dimensional MRCP images were rendered by post processing. CONTRAST:  6.42mL GADAVIST GADOBUTROL 1 MMOL/ML IV SOLN COMPARISON:  CT abdomen and pelvis from September of 2021 FINDINGS: Lower chest: Lung bases are clear, no consolidation or sign of pleural effusion. Limited assessment of lung bases on MRI. Hepatobiliary: Masslike appearance of the liver and a portion of the gallbladder. Obstruction of the biliary tree at the porta hepatis. Is soft tissue with enhancement extends to the porta hepatis obstructing the biliary tree. And irregular masslike area with irregular peripheral enhancement extends from the gallbladder fossa into the porta hepatis where the bile duct shows transition. Biliary drainage catheter remains in place. This area measures  7.9 x 6.7 cm in greatest axial dimension with another area in hepatic subsegment IV that measures 2.9 x 2.3 cm. Enhancement along the margin posteriorly with soft tissue measuring 3.5 x 2.3 cm on image 46 of series 30. Profound biliary duct distension. T1 signal with mild mixed signal in the area and nonenhancing central portion of this large area extending into the central liver. Portal vein is narrowed at the biliary confluence. This is near the site of biliary duct transition no additional hepatic lesion. Pilar Plate enhancing soft tissue just above the pancreatic head is seen on image 48 of series 32 Pancreas:  No sign of pancreatic inflammation or ductal dilation. Spleen:  Normal Adrenals/Urinary Tract: Adrenal glands are normal. Kidneys with smooth contours. No hydronephrosis. Stomach/Bowel: Gastrointestinal tract without acute process. No acute gastrointestinal process to the extent evaluated. Limited assessment on MRI. Vascular/Lymphatic: Normal caliber abdominal aorta. There is no gastrohepatic or hepatoduodenal ligament lymphadenopathy. No retroperitoneal or mesenteric lymphadenopathy. Other:  Small volume ascites about the liver. Musculoskeletal: No acute musculoskeletal process. No destructive bone finding on limited assessment. IMPRESSION: 1. Masslike area in the central liver obstructing bile ducts, common hepatic duct and narrowing the portal vein extending from the gallbladder fossa a highly suspicious for aggressive biliary neoplasm/cholangiocarcinoma/gallbladder carcinoma. Superimposed infection is also possible. 2. Pancreas is largely normal but inseparable from enhancing soft tissue that extends from the porta hepatis inferiorly and with irregular enhancement that surrounds an area of presumed necrotic tumor and or infected necrotic debris. 3. Separate smaller area showing heterogeneous enhancement likely reflects additional site of tumor in the liver in the inferior medial LEFT hepatic lobe. 4. Small  volume ascites about the liver. 5. Biliary drainage tube appears to remain in place, not well evaluated except on MRCP sequences were a can be faintly seen. Electronically Signed   By: Zetta Bills M.D.   On: 05/20/2020 17:55

## 2020-05-26 NOTE — Progress Notes (Signed)
Called CT radiology to confirm orders from today for CT abdomen pelvis with and without contrast. Radiology will bring contrast for pt to drink and test will be completed tonight. Pt aware.

## 2020-05-26 NOTE — Progress Notes (Signed)
New 20 ga IV catheter placed to the right arm. Pt tolerated well. Pt has taken his first omnipaque oral solution 9 mg iodine/mL. Will start second one at 2325.

## 2020-05-26 NOTE — Progress Notes (Signed)
Subjective:   Hospital day: 7  Overnight event: NAEOV. Patient spiked a low grade fever yesterday evening.   This AM, reports slept well no nausea, vomiting, or abdominal pain. Denies symptoms of bleeding, hematuria, hemypthytis.  Updated on results of pathology and plan for CT scan of abdomen to help with making further plans. Patient inquiring on prognosis of his condition, discussed that will need further workup and multidisciplinary oncology team will work with him on this. State he does not want to ruin Christmas for his family so he does not want to tell his wife until after 37.   Objective:  Vital signs in last 24 hours: Vitals:   05/25/20 0638 05/25/20 1433 05/25/20 1615 05/25/20 2100  BP: 123/70 131/67 132/69 129/63  Pulse: 82 83 84 80  Resp: 20 18 18 18   Temp: 99 F (37.2 C) (!) 100.8 F (38.2 C) (!) 100.7 F (38.2 C) 99.6 F (37.6 C)  TempSrc: Oral Oral Oral Oral  SpO2: 100% 99% 100% 98%  Weight: 70.4 kg     Height:        Physical Exam  General: Pleasant, ill-appearing elderly man laying in bed. No acute distress. Head: Normocephalic. Atraumatic. HEENT: Sclera icterus improved. MMM. CV: RRR. No murmurs, rubs, or gallops. No LE edema Pulmonary: Lungs CTAB. Normal effort. No wheezing or rales. Abdominal: Soft, nontender, nondistended. Normal bowel sounds. Perc cholecystostomy draining bloody fluids. Extremities: Palpable pulses. Normal ROM. Skin: Warm and dry. No obvious rash or lesions. Jaundice resolved. Neuro: A&Ox3. Moves all extremities. Normal sensation. No focal deficit. Psych: Normal mood and affect  Assessment/Plan: John Dickerson is a 59 y.o. male with history of well-controlled HIV, HTN, CAD, recent diagnosis of acalculous cholecystitis s/p percutaneous drain placement who presents with progressive weight loss, fatigue, and jaundice and undergoing further work up painless jaundice and hypercalcemia. Found to have a masslike structure in  the liver causing duct obstruction and narrowing of the PV.   Principal Problem:   Painless jaundice Active Problems:   Hypercalcemia   Protein-calorie malnutrition, severe   Elevated liver enzymes   Elevated alkaline phosphatase level   Loss of weight  Painless jaundice  Presented with unintentional weight loss, poor appetite, and jaundice and found to have cholestatic pattern on CMP with significantly elevated alkaline phos. He is s/p MRCP showing bilary mass concerning for cholangiocarcinoma. GI consulted and performed EUS/ERCP with biopsy and brushings taken for cytology/pathology. 2 plastic stents placed into left hepatic duct system with good resulting drainage. LTFs continue to improve after stent placement. CA-19-9 elevated to 753. This AM, patient had no complaint. He was informed about his pathology results showing an carcinoma. Patient does not want to inform wife at the moment.  --Appreciate GI's assistance co-managing this patient --Liver enzymes improved --Path/cytology shows poorly differentiated carcinoma, favor adenocarcinoma. Primary gallbladder vs cholangiocarcinoma  --F/u CT abd/pel --Oncology consulted, will see in the evening --Continue ciprofloxacin 500 mg BID x 5 days for hilar stenosis (day 4 of 5) --Continue PO Protonix 40 mg daily --Daily CMP --Drain care --GI will arrange ERCP with elective biliary stent exchange in a few months as outpatient  Hypercalcemia Patient found to have an elevated calcium of 14.2 in clinic. He is s/p 1x Calcitonin and net +5 liters since admission with good urine output. Vitamin D, calcitriol, and PTH are low. PTHrp pending. Ca now normal at 8.8 with a corrected Ca of 10.7. --F/u PTHrPep --Daily CMP  HTN Patient found to have soft BP  with systolic in the 74J on arrival. S/p 3 L of NS bolus. BP is 123/70 this morning. Will monitor closely. --Holding home anti-hypertensives --Daily vitals  Normocytic Anemia Patient's hgb has been  gradually trending down since admission. Currently at 7.5. No signs of active bleeding. Denies any hematuria, hemoptysis or bloody stools.  --F/u FOBT --Daily CBC --Transfuse if 7 or lower  HIV Chronic and stable. Viral load undetectable. Restarted Biktarvy after improvement in hepatic function. --Continue Biktarvy --Will follow up with ID doc  Diet: Regular IVF: None VTE: Heparin 5000 units subcu q8h CODE: Full Code  Prior to Admission Living Arrangement: Home Anticipated Discharge Location: Home Barriers to Discharge: Medical workup Dispo: Anticipated discharge in approximately 1-2 day(s).   Signed: Lacinda Axon, MD 05/26/2020, 5:51 AM  Pager: [phone] Internal Medicine Teaching Service After 5pm on weekdays and 1pm on weekends: On Call pager: [phone]

## 2020-05-26 NOTE — Progress Notes (Addendum)
Bile duct pathology shows: Poorly differentiated carcinoma, favor adenocarcinoma. Primary gallbladder vs cholangiocarcinoma based on imaging and pathology. I reviewed the pathology findings with the patient and addressed his questions. No family or friends present in the room. Recommend CT chest/abdomen/pelvis and Oncology consultation. We will arrange ERCP with elective biliary stent exchange in a few months as outpatient.

## 2020-05-26 NOTE — Progress Notes (Signed)
Nutrition Follow-up  DOCUMENTATION CODES:   Severe malnutrition in context of acute illness/injury  INTERVENTION:    Ensure Enlive po once daily, each supplement provides 350 kcal and 20 grams of protein.  MVI with minerals po daily.  NUTRITION DIAGNOSIS:   Severe Malnutrition related to acute illness (acalculous cholecystitis) as evidenced by energy intake < or equal to 50% for > or equal to 5 days, moderate muscle depletion, percent weight loss (18% weight loss within 3 months).  Ongoing  GOAL:   Patient will meet greater than or equal to 90% of their needs   Met  MONITOR:   PO intake, Supplement acceptance  REASON FOR ASSESSMENT:   Malnutrition Screening Tool    ASSESSMENT:   59 yo male admitted with fatigue and weight loss. PMH includes recent acalculous cholecystitis S/P percutaneous cholecystostomy with exchange of drain on 04/22/20, HIV, DM2, HTN, HLD, anemia, CAD, hepatitis C.   Bile duct pathology showed poorly differentiated carcinoma, favor adenocarcinoma. Primary gallbladder vs cholangiocarcinoma.   Currently on a regular diet. Patient reports good intake. States he is eating 100% of meals and drinking Ensure Enlive once daily. This meets nutrition needs.  Current weight 70.4 kg (12/7) up from 65.9 kg on admission.  Labs reviewed. Sodium 133 Medications reviewed.   Diet Order:   Diet Order            Diet regular Room service appropriate? Yes; Fluid consistency: Thin  Diet effective now                 EDUCATION NEEDS:   Not appropriate for education at this time  Skin:  Skin Assessment: Reviewed RN Assessment (incision to RU abdomen)  Last BM:  12/6  Height:   Ht Readings from Last 1 Encounters:  05/20/20 _0  (1.626 m)    Weight:   Wt Readings from Last 1 Encounters:  05/25/20 70.4 kg    Ideal Body Weight:  59.1 kg  BMI:  Body mass index is 26.62 kg/m.  Estimated Nutritional Needs:   Kcal:  2100-2300  Protein:   100-115 gm  Fluid:  >/= 2 L    Lucas Mallow, RD, LDN, CNSC Please refer to Amion for contact information.

## 2020-05-27 ENCOUNTER — Inpatient Hospital Stay (HOSPITAL_COMMUNITY): Payer: Medicare HMO

## 2020-05-27 ENCOUNTER — Other Ambulatory Visit: Payer: Self-pay | Admitting: Internal Medicine

## 2020-05-27 DIAGNOSIS — D490 Neoplasm of unspecified behavior of digestive system: Secondary | ICD-10-CM

## 2020-05-27 DIAGNOSIS — I700 Atherosclerosis of aorta: Secondary | ICD-10-CM | POA: Diagnosis present

## 2020-05-27 LAB — COMPREHENSIVE METABOLIC PANEL
ALT: 61 U/L — ABNORMAL HIGH (ref 0–44)
AST: 72 U/L — ABNORMAL HIGH (ref 15–41)
Albumin: 1.5 g/dL — ABNORMAL LOW (ref 3.5–5.0)
Alkaline Phosphatase: 605 U/L — ABNORMAL HIGH (ref 38–126)
Anion gap: 11 (ref 5–15)
BUN: 13 mg/dL (ref 8–23)
CO2: 25 mmol/L (ref 22–32)
Calcium: 9.3 mg/dL (ref 8.9–10.3)
Chloride: 95 mmol/L — ABNORMAL LOW (ref 98–111)
Creatinine, Ser: 1.1 mg/dL (ref 0.61–1.24)
GFR, Estimated: >60 mL/min (ref >60)
Glucose, Bld: 154 mg/dL — ABNORMAL HIGH (ref 70–99)
Potassium: 3.6 mmol/L (ref 3.5–5.1)
Sodium: 131 mmol/L — ABNORMAL LOW (ref 135–145)
Total Bilirubin: 3.5 mg/dL — ABNORMAL HIGH (ref 0.3–1.2)
Total Protein: 6.2 g/dL — ABNORMAL LOW (ref 6.5–8.1)

## 2020-05-27 LAB — CBC WITH DIFFERENTIAL/PLATELET
Abs Immature Granulocytes: 0.09 10*3/uL — ABNORMAL HIGH (ref 0.00–0.07)
Basophils Absolute: 0 10*3/uL (ref 0.0–0.1)
Basophils Relative: 0 %
Eosinophils Absolute: 0.2 10*3/uL (ref 0.0–0.5)
Eosinophils Relative: 2 %
HCT: 23 % — ABNORMAL LOW (ref 39.0–52.0)
Hemoglobin: 7.8 g/dL — ABNORMAL LOW (ref 13.0–17.0)
Immature Granulocytes: 1 %
Lymphocytes Relative: 11 %
Lymphs Abs: 1.3 10*3/uL (ref 0.7–4.0)
MCH: 29.4 pg (ref 26.0–34.0)
MCHC: 33.9 g/dL (ref 30.0–36.0)
MCV: 86.8 fL (ref 80.0–100.0)
Monocytes Absolute: 1.3 10*3/uL — ABNORMAL HIGH (ref 0.1–1.0)
Monocytes Relative: 10 %
Neutro Abs: 9.3 10*3/uL — ABNORMAL HIGH (ref 1.7–7.7)
Neutrophils Relative %: 76 %
Platelets: 518 10*3/uL — ABNORMAL HIGH (ref 150–400)
RBC: 2.65 MIL/uL — ABNORMAL LOW (ref 4.22–5.81)
RDW: 20.2 % — ABNORMAL HIGH (ref 11.5–15.5)
WBC: 12.2 10*3/uL — ABNORMAL HIGH (ref 4.0–10.5)
nRBC: 0 % (ref 0.0–0.2)

## 2020-05-27 LAB — TYPE AND SCREEN
ABO/RH(D): A POS
Antibody Screen: NEGATIVE

## 2020-05-27 LAB — ABO/RH: ABO/RH(D): A POS

## 2020-05-27 MED ORDER — VITAMIN D 25 MCG (1000 UNIT) PO TABS: 1000.0000 [IU] | ORAL_TABLET | Freq: Every day | ORAL | Status: DC

## 2020-05-27 MED ORDER — IOHEXOL 300 MG/ML  SOLN
75.0000 mL | Freq: Once | INTRAMUSCULAR | Status: AC | PRN
  Administered 2020-05-27: 75 mL via INTRAVENOUS

## 2020-05-27 MED ORDER — DEXAMETHASONE 4 MG PO TABS: 4.0000 mg | ORAL_TABLET | Freq: Two times a day (BID) | ORAL | 0 refills | Status: AC

## 2020-05-27 MED ORDER — IOHEXOL 300 MG/ML  SOLN
100.0000 mL | Freq: Once | INTRAMUSCULAR | Status: AC | PRN
  Administered 2020-05-27: 100 mL via INTRAVENOUS

## 2020-05-27 MED ORDER — VITAMIN D3 25 MCG PO TABS: 1000.0000 [IU] | ORAL_TABLET | Freq: Every day | ORAL | 0 refills | Status: AC

## 2020-05-27 NOTE — Plan of Care (Signed)
Problem: Education: Goal: Knowledge of General Education information will improve Description: Including pain rating scale, medication(s)/side effects and non-pharmacologic comfort measures 05/27/2020 0016 by Robley Fries, RN Outcome: Progressing 05/27/2020 0015 by Robley Fries, RN Reactivated 05/27/2020 0013 by Robley Fries, RN Outcome: Completed/Met   Problem: Health Behavior/Discharge Planning: Goal: Ability to manage health-related needs will improve 05/27/2020 0016 by Robley Fries, RN Outcome: Progressing 05/27/2020 0015 by Robley Fries, RN Reactivated 05/27/2020 0013 by Robley Fries, RN Outcome: Completed/Met   Problem: Clinical Measurements: Goal: Ability to maintain clinical measurements within normal limits will improve 05/27/2020 0016 by Robley Fries, RN Outcome: Progressing 05/27/2020 0015 by Robley Fries, RN Reactivated 05/27/2020 0013 by Robley Fries, RN Outcome: Completed/Met Goal: Will remain free from infection 05/27/2020 0016 by Robley Fries, RN Outcome: Progressing 05/27/2020 0015 by Robley Fries, RN Reactivated 05/27/2020 0013 by Robley Fries, RN Outcome: Completed/Met Goal: Diagnostic test results will improve 05/27/2020 0016 by Robley Fries, RN Outcome: Progressing 05/27/2020 0015 by Robley Fries, RN Reactivated 05/27/2020 0013 by Robley Fries, RN Outcome: Completed/Met Goal: Respiratory complications will improve 05/27/2020 0016 by Robley Fries, RN Outcome: Progressing 05/27/2020 0015 by Robley Fries, RN Reactivated 05/27/2020 0013 by Robley Fries, RN Outcome: Completed/Met Goal: Cardiovascular complication will be avoided 05/27/2020 0016 by Robley Fries, RN Outcome: Progressing 05/27/2020 0015 by Robley Fries, RN Reactivated 05/27/2020 0013 by Robley Fries, RN Outcome: Completed/Met   Problem: Activity: Goal: Risk for activity intolerance will decrease 05/27/2020 0016 by  Robley Fries, RN Outcome: Progressing 05/27/2020 0015 by Robley Fries, RN Reactivated 05/27/2020 0013 by Robley Fries, RN Outcome: Completed/Met   Problem: Nutrition: Goal: Adequate nutrition will be maintained 05/27/2020 0016 by Robley Fries, RN Outcome: Progressing 05/27/2020 0015 by Robley Fries, RN Reactivated 05/27/2020 0013 by Robley Fries, RN Outcome: Completed/Met   Problem: Coping: Goal: Level of anxiety will decrease 05/27/2020 0016 by Robley Fries, RN Outcome: Progressing 05/27/2020 0015 by Robley Fries, RN Reactivated 05/27/2020 0013 by Robley Fries, RN Outcome: Completed/Met   Problem: Elimination: Goal: Will not experience complications related to bowel motility 05/27/2020 0016 by Robley Fries, RN Outcome: Progressing 05/27/2020 0015 by Robley Fries, RN Reactivated 05/27/2020 0013 by Robley Fries, RN Outcome: Completed/Met Goal: Will not experience complications related to urinary retention 05/27/2020 0016 by Robley Fries, RN Outcome: Progressing 05/27/2020 0015 by Robley Fries, RN Reactivated 05/27/2020 0013 by Robley Fries, RN Outcome: Completed/Met   Problem: Pain Managment: Goal: General experience of comfort will improve 05/27/2020 0016 by Robley Fries, RN Outcome: Progressing 05/27/2020 0015 by Robley Fries, RN Reactivated 05/27/2020 0013 by Robley Fries, RN Outcome: Completed/Met   Problem: Safety: Goal: Ability to remain free from injury will improve 05/27/2020 0016 by Robley Fries, RN Outcome: Progressing 05/27/2020 0015 by Robley Fries, RN Reactivated 05/27/2020 0013 by Robley Fries, RN Outcome: Completed/Met   Problem: Skin Integrity: Goal: Risk for impaired skin integrity will decrease 05/27/2020 0016 by Robley Fries, RN Outcome: Progressing 05/27/2020 0015 by Robley Fries, RN Reactivated 05/27/2020 0013 by Robley Fries, RN Outcome: Completed/Met    Problem: Nutrition Goal: Patient maintains adequate hydration 05/27/2020 0016 by Robley Fries, RN Outcome: Progressing 05/27/2020 0015 by Robley Fries, RN Reactivated 05/27/2020 0013 by Robley Fries, RN Outcome: Completed/Met Goal: Patient maintains weight 05/27/2020 0016 by Jodelle Gross,  Philis Pique, RN Outcome: Progressing 05/27/2020 0015 by Robley Fries, RN Reactivated 05/27/2020 0013 by Robley Fries, RN Outcome: Completed/Met Goal: Patient/Family demonstrates understanding of diet 05/27/2020 0016 by Robley Fries, RN Outcome: Progressing 05/27/2020 0015 by Robley Fries, RN Reactivated 05/27/2020 0013 by Robley Fries, RN Outcome: Completed/Met Goal: Patient/Family independently completes tube feeding 05/27/2020 0016 by Robley Fries, RN Outcome: Progressing 05/27/2020 0015 by Robley Fries, RN Reactivated 05/27/2020 0013 by Robley Fries, RN Outcome: Completed/Met Goal: Patient will have no more than 5 lb weight change during LOS 05/27/2020 0016 by Robley Fries, RN Outcome: Progressing 05/27/2020 0015 by Robley Fries, RN Reactivated 05/27/2020 0013 by Robley Fries, RN Outcome: Completed/Met Goal: Patient will utilize adaptive techniques to administer nutrition 05/27/2020 0016 by Robley Fries, RN Outcome: Progressing 05/27/2020 0015 by Robley Fries, RN Reactivated 05/27/2020 0013 by Robley Fries, RN Outcome: Completed/Met Goal: Patient will verbalize dietary restrictions 05/27/2020 0016 by Robley Fries, RN Outcome: Progressing 05/27/2020 0015 by Robley Fries, RN Reactivated 05/27/2020 0013 by Robley Fries, RN Outcome: Completed/Met

## 2020-05-27 NOTE — Progress Notes (Signed)
Pt has completed second Omnipaque oral solution 9 mg iodine/mL 500 mL. Ready for CT.

## 2020-05-27 NOTE — Progress Notes (Signed)
Pt transported to radiology via wheelchair.

## 2020-05-27 NOTE — Progress Notes (Addendum)
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, less likely [facility] called earlier today to request additional stains to help better differentiate primary biliary malignancy versus HCC I will see him next week in my office on Tuesday 12/14 at 2 pm, patient is advised to check in at 130 pm, appointment given to the patient in writing  Malignant hypercalcemia He has received calcitonin injection His calcium level has improved, but is trending upward again Will need to this very closely  The patient may benefit from steroid therapy to stabilize hypercalcemia I recommend dexamethasone 4 mg BID PO until I see him next week   Severe anemia Component of anemia chronic disease versus bleeding I recommend blood transfusion if hemoglobin start to trend down further Will recheck in my office next week   HIV infection He will continue antiretroviral therapy   Obstructive jaundice With percutaneous drainage, his bilirubin has improved I would defer to GI service for further management  Diabetes I will defer to primary service I recommend close follow-up with primary doctor and dietary changes while on steroids   Goals of care discussion The patient is overall withdrawn He does not want me to share his diagnosis with his wife Given the location and multiple masses, this is not treatable by surgery or radiation Treatment would be predominantly chemotherapy He did ask prognosis I estimate prognosis to be 1-2 years, depending on response to therapy I will see him next week in my  office, will sign off   All questions were answered. The patient knows to call the clinic with any problems, questions or concerns. No barriers to learning was detected.  Mikey Bussing, DNP, AGPCNP-BC, AOCNP [name], MD  SUBJECTIVE:  The patient reports that he is feeling better today Denies abdominal pain, nausea, vomiting No breathing difficulty No bleeding reported He wants to go home  Past Medical History:  Diagnosis Date   Anemia    Anemia due to medication 09/24/2015   Arthritis    "feet" (11/19/2015)   CAD (coronary artery disease), native coronary artery    a. remote LAD stent 1990s. b. s/p DESx2 to LAD 11/2015   Hepatitis A immune 11/20/2016   Hepatitis B immune 11/20/2016   Hepatitis C    "going thru the treatments" (11/19/2015)   High cholesterol    History of gout    "just recently" (11/19/2015)   HIV (human immunodeficiency virus infection) ([place])    Hypertension    Posterior pain of left hip 03/01/2018   Type II diabetes mellitus ([place])    diagnosed 04-2015   Past Surgical History:  Procedure Laterality Date   BILIARY BRUSHING  05/22/2020   Procedure: BILIARY BRUSHING;  Surgeon: Irving Copas., MD;  Location: [place];  Service: Gastroenterology;;   BILIARY DILATION  05/22/2020   Procedure: BILIARY DILATION;  Surgeon: Irving Copas., MD;  Location: [facility] ENDOSCOPY;  Service: Gastroenterology;;   BILIARY STENT PLACEMENT  05/22/2020   Procedure: BILIARY STENT PLACEMENT;  Surgeon: Irving Copas., MD;  Location: [facility] ENDOSCOPY;  Service: Gastroenterology;;   BIOPSY  05/22/2020   Procedure: BIOPSY;  Surgeon: Irving Copas.,  MD;  Location: [place];  Service: Gastroenterology;;   CARDIAC CATHETERIZATION N/A 11/19/2015   Procedure: Left Heart Cath and Coronary Angiography;  Surgeon: Jolaine Artist, MD;  Location: [place] CV LAB;  Service: Cardiovascular;  Laterality: N/A;   CARDIAC CATHETERIZATION N/A 11/19/2015   Procedure: Coronary  Stent Intervention;  Surgeon: Jettie Booze, MD;  Location: [place] CV LAB;  Service: Cardiovascular;  Laterality: N/A;   CORONARY ANGIOPLASTY WITH STENT PLACEMENT  11/19/2015   "2 stents"   ENDOSCOPIC RETROGRADE CHOLANGIOPANCREATOGRAPHY (ERCP) WITH PROPOFOL N/A 05/22/2020   Procedure: ENDOSCOPIC RETROGRADE CHOLANGIOPANCREATOGRAPHY (ERCP) WITH PROPOFOL;  Surgeon: Irving Copas., MD;  Location: [place];  Service: Gastroenterology;  Laterality: N/A;   ESOPHAGOGASTRODUODENOSCOPY (EGD) WITH PROPOFOL N/A 05/22/2020   Procedure: ESOPHAGOGASTRODUODENOSCOPY (EGD) WITH PROPOFOL;  Surgeon: Rush Landmark Telford Nab., MD;  Location: [place];  Service: Gastroenterology;  Laterality: N/A;   IR CHOLANGIOGRAM EXISTING TUBE  03/23/2020   IR EXCHANGE BILIARY DRAIN  04/22/2020   IR PERC CHOLECYSTOSTOMY  03/06/2020   IR RADIOLOGIST EVAL & MGMT  04/06/2020   IR RADIOLOGIST EVAL & MGMT  04/20/2020   other     unknown abdominal surgery in childhood   REMOVAL OF STONES  05/22/2020   Procedure: REMOVAL OF STONES;  Surgeon: Irving Copas., MD;  Location: [place];  Service: Gastroenterology;;   Joan Mayans  05/22/2020   Procedure: Joan Mayans;  Surgeon: Irving Copas., MD;  Location: [place];  Service: Gastroenterology;;   UPPER ESOPHAGEAL ENDOSCOPIC ULTRASOUND (EUS) N/A 05/22/2020   Procedure: UPPER ESOPHAGEAL ENDOSCOPIC ULTRASOUND (EUS);  Surgeon: Irving Copas., MD;  Location: [place];  Service: Gastroenterology;  Laterality: N/A;   Social History   Socioeconomic History   Marital status: Legally Separated    Spouse name: Not on file   Number of children: Not on file   Years of education: Not on file   Highest education level: Not on file  Occupational History   Not on file  Tobacco Use   Smoking status: Former Smoker    Packs/day: 0.12    Years: 49.00    Pack years: 5.88    Types: Cigarettes    Start date: 07/20/2012    Quit date: 02/18/2015     Years since quitting: 5.2   Smokeless tobacco: Never Used  Vaping Use   Vaping Use: Never used  Substance and Sexual Activity   Alcohol use: Not Currently    Alcohol/week: 6.0 standard drinks    Types: 6 Standard drinks or equivalent per week   Drug use: No   Sexual activity: Yes    Partners: Female  Other Topics Concern   Not on file  Social History Narrative   Not on file   Social Determinants of Health   Financial Resource Strain: Not on file  Food Insecurity: Not on file  Transportation Needs: Not on file  Physical Activity: Not on file  Stress: Not on file  Social Connections: Not on file  Intimate Partner Violence: Not on file   Family History  Problem Relation Age of Onset   Diabetes Mother    Cancer Mother        unkown type   Hypertension Father    Colon cancer Neg Hx    No Known Allergies  REVIEW OF SYSTEMS:   Constitutional: Denies fevers, chills  Eyes: Still reports ongoing yellowing of eyes but he thinks this is improved Ears, nose, mouth, throat, and face: Denies mucositis or sore throat Respiratory: Denies cough, dyspnea  or wheezes Cardiovascular: Denies palpitation, chest discomfort Gastrointestinal:  Denies nausea, heartburn or change in bowel habits Skin: Denies abnormal skin rashes Lymphatics: Denies new lymphadenopathy or easy bruising Neurological:Denies numbness, tingling or new weaknesses Behavioral/Psych: Mood is stable, no new changes  Extremities: No lower extremity edema All other systems were reviewed with the patient and are negative.  I have reviewed the past medical history, past surgical history, social history and family history with the patient and they are unchanged from previous note.   PHYSICAL EXAMINATION: ECOG PERFORMANCE STATUS: 2 - Symptomatic, <50% confined to bed  Vitals:   05/27/20 0000 05/27/20 0500  BP: 138/70 139/70  Pulse: 80 73  Resp: 18 18  Temp: 99 F (37.2 C) 99 F (37.2 C)  SpO2: 100% 96%    Filed Weights   05/22/20 0530 05/22/20 1244 05/25/20 0638  Weight: 149 lb (67.6 kg) 149 lb 0.5 oz (67.6 kg) 155 lb 1.6 oz (70.4 kg)    Intake/Output from previous day: 12/08 0701 - 12/09 0700 In: 310 [P.O.:300] Out: 1897 [Urine:1877; Drains:20]  GENERAL:alert, no distress and comfortable SKIN: skin color, texture, turgor are normal, no rashes or significant lesions EYES: Scleral icterus present OROPHARYNX:no exudate, no erythema and lips, buccal mucosa, and tongue normal  LUNGS: clear to auscultation and percussion with normal breathing effort HEART: regular rate & rhythm and no murmurs and no lower extremity edema ABDOMEN:abdomen soft, non-tender and normal bowel sounds, percutaneous biliary drain present NEURO: alert & oriented x 3 with fluent speech, no focal motor/sensory deficits  LABORATORY DATA:  I have reviewed the data as listed CMP Latest Ref Rng & Units 05/27/2020 05/26/2020 05/25/2020  Glucose 70 - 99 mg/dL 154(H) 156(H) 176(H)  BUN 8 - 23 mg/dL 13 16 13   Creatinine 0.61 - 1.24 mg/dL 1.10 1.16 1.24  Sodium 135 - 145 mmol/L 131(L) 133(L) 133(L)  Potassium 3.5 - 5.1 mmol/L 3.6 3.4(L) 3.6  Chloride 98 - 111 mmol/L 95(L) 99 100  CO2 22 - 32 mmol/L 25 24 24   Calcium 8.9 - 10.3 mg/dL 9.3 8.8(L) 8.6(L)  Total Protein 6.5 - 8.1 g/dL 6.2(L) 5.8(L) 5.7(L)  Total Bilirubin 0.3 - 1.2 mg/dL 3.5(H) 3.8(H) 4.0(H)  Alkaline Phos 38 - 126 U/L 605(H) 637(H) 758(H)  AST 15 - 41 U/L 72(H) 61(H) 64(H)  ALT 0 - 44 U/L 61(H) 56(H) 62(H)    Lab Results  Component Value Date   WBC 12.2 (H) 05/27/2020   HGB 7.8 (L) 05/27/2020   HCT 23.0 (L) 05/27/2020   MCV 86.8 05/27/2020   PLT 518 (H) 05/27/2020   NEUTROABS 9.3 (H) 05/27/2020   I have personally reviewed his CT CT ABDOMEN PELVIS W WO CONTRAST  Result Date: 05/27/2020 CLINICAL DATA:  Cancer of unknown primary. Staging liver disease. Poorly differentiated carcinoma. EXAM: CT ABDOMEN AND PELVIS WITHOUT AND WITH CONTRAST TECHNIQUE:  Multidetector CT imaging of the abdomen and pelvis was performed following the standard protocol before and following the bolus administration of intravenous contrast. CONTRAST:  146mL OMNIPAQUE IOHEXOL 300 MG/ML  SOLN COMPARISON:  03/05/2020 CT scan and MRI 05/20/2020. FINDINGS: Lower chest: The heart is within normal limits in size. No pericardial effusion. Age advanced aortic and coronary artery calcifications are noted. Small left pleural effusion overlying atelectasis. Minimal focus of right basilar atelectasis. Hepatobiliary: As demonstrated on the MRI examination there is an ill-defined infiltrating neoplastic process in the central liver inferiorly. Heterogeneous peripheral enhancement mainly on the delayed images. Findings most likely due to an infiltrating biliary  neoplasm (cholangiocarcinoma). The intrahepatic biliary dilatation has resolved due to the biliary stent. There is also a catheter in the gallbladder which is decompressed. Some gas is noted in the biliary tree which is not unexpected. Stable marked narrowing and irregularity of the middle and right portal veins with possible partial thrombosis. The left portal vein is patent. Pancreas: No mass, inflammation or ductal dilatation. Spleen: Normal size. No focal lesions. Adrenals/Urinary Tract: Adrenal glands and kidneys are unremarkable. No worrisome renal lesions or hydronephrosis. Bladder is normal. Stomach/Bowel: The stomach, duodenum, small bowel and colon are unremarkable. No acute inflammatory process, mass lesions or obstructive findings. The terminal ileum and appendix are normal. Vascular/Lymphatic: Age advanced atherosclerotic calcifications involving the aorta and branch vessels but no aneurysm or dissection. The branch vessels are patent. The major venous structures are patent. No abdominal lymphadenopathy, omental disease or peritoneal implants. Reproductive: The prostate gland and seminal vesicles are unremarkable. Other: No pelvic  mass or adenopathy. No free pelvic fluid collections. No inguinal adenopathy. Musculoskeletal: No significant bony findings. IMPRESSION: 1. Stable appearing ill-defined infiltrating neoplastic process in the central liver inferiorly. This is most likely due to an infiltrating biliary neoplasm (cholangiocarcinoma). 2. Resolution of intrahepatic biliary dilatation due to the biliary stent. 3. Stable marked narrowing and irregularity of the middle and right portal veins with possible partial thrombosis. 4. Age advanced atherosclerotic calcifications involving the aorta and branch vessels. 5. Small left pleural effusion with overlying atelectasis. 6. Aortic atherosclerosis. Aortic Atherosclerosis (ICD10-I70.0). Electronically Signed   By: Marijo Sanes M.D.   On: 05/27/2020 05:22   CT CHEST W CONTRAST  Result Date: 05/27/2020 CLINICAL DATA:  Cancer of unknown primary.  Liver mass. EXAM: CT CHEST WITH CONTRAST TECHNIQUE: Multidetector CT imaging of the chest was performed during intravenous contrast administration. CONTRAST:  72mL OMNIPAQUE IOHEXOL 300 MG/ML  SOLN COMPARISON:  CT abdomen/pelvis from 05/27/2020 at 12:57 a.m. (Approximately 12 hours prior to this current examination) FINDINGS: Cardiovascular: Coronary, aortic arch, and branch vessel atherosclerotic vascular disease. Mediastinum/Nodes: No pathologic adenopathy observed. Mild bilateral gynecomastia. Lungs/Pleura: Paraseptal emphysema best. Appreciated along the apices 2 mm nodule along the confluence of the major and minor fissure on image 71 series 7, probably a benign subpleural lymph node. No findings of lung mass or specifically worrisome lung nodule. Mild scarring or subsegmental atelectasis anteriorly in the left lower lobe for example on image 93 of series 7. Upper Abdomen: Hypoenhancing and potentially centrally necrotic mass along the gallbladder fossa involving the right hepatic lobe and left hepatic lobe, with 2 biliary stents partially  included on today's examination, and a small amount of pneumobilia. Pigtail catheter below the liver partially seen on today's examination. Abdominal aortic atherosclerotic calcification. Musculoskeletal: Lipoma between the right pectoralis major and minor muscles. IMPRESSION: 1. Hypoenhancing and potentially centrally necrotic mass along the gallbladder fossa involving the right hepatic lobe and left hepatic lobe, with 2 biliary stents partially included on today's examination, and a small amount of pneumobilia. 2. No findings of metastatic disease to the chest. 3. Coronary, aortic arch, and branch vessel atherosclerotic vascular disease. 4. Paraseptal emphysema. 5. Mild bilateral gynecomastia. 6. Lipoma between the right pectoralis major and minor muscles. 7. Emphysema and aortic atherosclerosis. Aortic Atherosclerosis (ICD10-I70.0) and Emphysema (ICD10-J43.9). Electronically Signed   By: Van Clines M.D.   On: 05/27/2020 15:07   DG ERCP BILIARY & PANCREATIC DUCTS  Result Date: 05/22/2020 CLINICAL DATA:  ERCP. EXAM: ERCP TECHNIQUE: Multiple spot images obtained with the fluoroscopic device and submitted for  interpretation post-procedure. FLUOROSCOPY TIME:  Fluoroscopy Time:  8 minutes and 21 seconds. Number of Acquired Spot Images: 23 COMPARISON:  MRI dated 05/20/2020 FINDINGS: Cannulation of the common bile duct with injection of contrast demonstrates intrahepatic biliary ductal dilatation. Subsequent images demonstrate placement 2 plastic biliary stents. IMPRESSION: ERCP with stent placement as detailed above. These images were submitted for radiologic interpretation only. Please see the procedural report for the amount of contrast and the fluoroscopy time utilized. Electronically Signed   By: Constance Holster M.D.   On: 05/22/2020 18:40   MR ABDOMEN MRCP W WO CONTAST  Result Date: 05/20/2020 CLINICAL DATA:  Painless jaundice in a 59 year old male EXAM: MRI ABDOMEN WITHOUT AND WITH CONTRAST  (INCLUDING MRCP) TECHNIQUE: Multiplanar multisequence MR imaging of the abdomen was performed both before and after the administration of intravenous contrast. Heavily T2-weighted images of the biliary and pancreatic ducts were obtained, and three-dimensional MRCP images were rendered by post processing. CONTRAST:  6.47mL GADAVIST GADOBUTROL 1 MMOL/ML IV SOLN COMPARISON:  CT abdomen and pelvis from September of 2021 FINDINGS: Lower chest: Lung bases are clear, no consolidation or sign of pleural effusion. Limited assessment of lung bases on MRI. Hepatobiliary: Masslike appearance of the liver and a portion of the gallbladder. Obstruction of the biliary tree at the porta hepatis. Is soft tissue with enhancement extends to the porta hepatis obstructing the biliary tree. And irregular masslike area with irregular peripheral enhancement extends from the gallbladder fossa into the porta hepatis where the bile duct shows transition. Biliary drainage catheter remains in place. This area measures 7.9 x 6.7 cm in greatest axial dimension with another area in hepatic subsegment IV that measures 2.9 x 2.3 cm. Enhancement along the margin posteriorly with soft tissue measuring 3.5 x 2.3 cm on image 46 of series 30. Profound biliary duct distension. T1 signal with mild mixed signal in the area and nonenhancing central portion of this large area extending into the central liver. Portal vein is narrowed at the biliary confluence. This is near the site of biliary duct transition no additional hepatic lesion. Pilar Plate enhancing soft tissue just above the pancreatic head is seen on image 48 of series 32 Pancreas:  No sign of pancreatic inflammation or ductal dilation. Spleen:  Normal Adrenals/Urinary Tract: Adrenal glands are normal. Kidneys with smooth contours. No hydronephrosis. Stomach/Bowel: Gastrointestinal tract without acute process. No acute gastrointestinal process to the extent evaluated. Limited assessment on MRI.  Vascular/Lymphatic: Normal caliber abdominal aorta. There is no gastrohepatic or hepatoduodenal ligament lymphadenopathy. No retroperitoneal or mesenteric lymphadenopathy. Other:  Small volume ascites about the liver. Musculoskeletal: No acute musculoskeletal process. No destructive bone finding on limited assessment. IMPRESSION: 1. Masslike area in the central liver obstructing bile ducts, common hepatic duct and narrowing the portal vein extending from the gallbladder fossa a highly suspicious for aggressive biliary neoplasm/cholangiocarcinoma/gallbladder carcinoma. Superimposed infection is also possible. 2. Pancreas is largely normal but inseparable from enhancing soft tissue that extends from the porta hepatis inferiorly and with irregular enhancement that surrounds an area of presumed necrotic tumor and or infected necrotic debris. 3. Separate smaller area showing heterogeneous enhancement likely reflects additional site of tumor in the liver in the inferior medial LEFT hepatic lobe. 4. Small volume ascites about the liver. 5. Biliary drainage tube appears to remain in place, not well evaluated except on MRCP sequences were a can be faintly seen. Electronically Signed   By: Zetta Bills M.D.   On: 05/20/2020 17:55     LOS: 8  days   [name], DNP, AGPCNP-BC, AOCNP 05/27/20

## 2020-05-27 NOTE — Discharge Instructions (Signed)
   Jaundice, Adult  Jaundice is when the skin, the whites of the eyes, and the lining of the mouth and nose (mucous membranes) turn a yellowish color. It is caused by having too much bilirubin in the blood. Bilirubin is made by the normal breakdown of red blood cells. Having jaundice means that your body's bile system may not be working as it should. The bile system is made up of the liver, the gallbladder, and the bile ducts. They work together to make, store, and move bile. Jaundice may be caused by drinking too much alcohol. It may also be caused by liver disease, infections, cancers, or some medicines. Your doctor may treat you with medicine, fluids, or surgery. Follow these instructions at home:   Take over-the-counter and prescription medicines only as told by your doctor.  You can use skin lotion to help with itching.  Drink plenty of fluids.  Do not drink alcohol.  Keep all follow-up visits as told by your doctor. This is important. Contact a doctor if:  You have a fever.  You have swelling or pain in your belly (abdomen). Get help right away if:  Your symptoms get worse all of a sudden.  Your pain gets worse.  You keep throwing up (vomiting).  You throw up blood.  You become weak or confused.  You get a very bad headache.  You have blood in your poop.  You lose too much body fluid (dehydration). Signs that have you lost too much body fluid include: ? A very dry mouth. ? A fast, weak pulse. ? Fast breathing. ? Blue lips. Summary  Jaundice is when the skin, the whites of the eyes, and the lining of the mouth and nose turn a yellowish color.  Jaundice may be caused by a problem in the liver, the gallbladder, and the bile ducts.  Follow your doctor's instructions for home care. These include drinking plenty of fluid, not drinking alcohol, and taking medicines as told.  Get help right away if your symptoms get worse, you feel weak or confused, you throw up, you  have blood in your poop, you have a very bad headache, or you lose too much body fluid. This information is not intended to replace advice given to you by your health care provider. Make sure you discuss any questions you have with your health care provider. Document Revised: 06/08/2017 Document Reviewed: 06/08/2017 Elsevier Patient Education  2020 [employer]. Mr. Markin,   It was a pleasure taking care of you at [facility]. We have treated your elevated calcium and the rest of your labs have improved significantly. We are discharging you home to follow up with your primary care doctor and oncology next week.   1) Start taking vitamin D 1000 units daily 2) Start taking Dexamethasone 4 mg twice daily. This can increase your blood sugar so make sure to check it daily  Take care, Dr. Linwood Dibbles

## 2020-05-27 NOTE — Progress Notes (Signed)
Subjective:   Hospital day: 7  Overnight event: NAEOV. Oncology saw patient yesterday.  This AM, feels well today states he is ready for discharge. Good appetite. Denies abdomina pain, nausea, vomiting, hematuria. Was able to discuss with oncology yesterday. Discussed plan for likely discharge today. Advised patient to take 1000 iu D3 daily after discharge.  Objective:  Vital signs in last 24 hours: Vitals:   05/25/20 2100 05/26/20 1442 05/26/20 2100 05/27/20 0000  BP: 129/63  (!) 149/72 138/70  Pulse: 80  81 80  Resp: 18  18 18   Temp: 99.6 F (37.6 C) 100 F (37.8 C) 99.7 F (37.6 C) 99 F (37.2 C)  TempSrc: Oral Oral Oral Oral  SpO2: 98%  100% 100%  Weight:      Height:        Physical Exam  General: Pleasant, ill-appearing elderly man laying in bed. No acute distress. Head: Normocephalic. Atraumatic. HEENT: Sclera icterus improved. MMM. CV: RRR. No murmurs, rubs, or gallops. No LE edema Pulmonary: Lungs CTAB. Normal effort. No wheezing or rales. Abdominal: Soft, nontender, nondistended. Normal bowel sounds. Perc cholecystostomy draining bloody fluids. Extremities: Palpable pulses. Normal ROM. Skin: Warm and dry. No obvious rash or lesions. Jaundice resolved. Neuro: A&Ox3. Moves all extremities. Normal sensation. No focal deficit. Psych: Normal mood and affect  Assessment/Plan: John Dickerson is a 59 y.o. male with history of well-controlled HIV, HTN, CAD, recent diagnosis of acalculous cholecystitis s/p percutaneous drain placement who presents with progressive weight loss, fatigue, and jaundice and undergoing further work up painless jaundice and hypercalcemia. Found to have a masslike structure in the liver causing duct obstruction and narrowing of the PV.   Principal Problem:   Painless jaundice Active Problems:   Hypercalcemia   Protein-calorie malnutrition, severe   Elevated liver enzymes   Elevated alkaline phosphatase level   Loss of  weight  Painless jaundice  Presented with unintentional weight loss, poor appetite, and jaundice and found to have cholestatic pattern on CMP with significantly elevated alkaline phos. He is s/p MRCP showing bilary mass concerning for cholangiocarcinoma. GI consulted and performed EUS/ERCP with biopsy and brushings taken for cytology/pathology. 2 plastic stents placed into left hepatic duct system with good resulting drainage. LTFs continue to improve after stent placement. CA-19-9 elevated to 753. CT abd/pel confirms ill-defined infiltrating neoplastic process in the central liver inferiorly and possible partial thrombosis of the middle and right portal veins. Patient ready to be discharge today. --Appreciate GI's assistance co-managing this patient --Oncology following, appreciate recs     --F/u CT chest --Liver enzymes improved --F/u AFP --Path/cytology shows poorly differentiated carcinoma, favor adenocarcinoma. Primary gallbladder vs cholangiocarcinoma  --Ciprofloxacin 500 mg (Day 5/5) --Continue PO Protonix 40 mg daily --Daily CMP --Drain care --GI will arrange ERCP with elective biliary stent exchange in a few months as outpatient  Hypercalcemia Patient found to have an elevated calcium of 14.2 in clinic. He is s/p 1x Calcitonin and net +5 liters since admission with good urine output. Vitamin D, calcitriol, and PTH are low. PTHrp pending. Ca now normal at 9.3. --F/u PTHrPep --Daily CMP  HTN Patient found to have soft BP with systolic in the 96E on arrival. S/p 3 L of NS bolus. BP is 138/70 this morning. Will monitor closely. --Holding home anti-hypertensives --Daily vitals  Normocytic Anemia Patient's hgb has been gradually trending down since admission. Hgb trending up, 7.8 from 7.5 yesterday. No signs of active bleeding. Likely anemia of chronic disease. Denies any hematuria, hemoptysis  or bloody stools.  --FOBT negative --Daily CBC --Transfuse if 7 or lower  HIV Chronic  and stable. Viral load undetectable. Restarted Biktarvy after improvement in hepatic function. --Continue Biktarvy --Will follow up with ID doc  Diet: Regular IVF: None VTE: Heparin 5000 units subcu q8h CODE: Full Code  Prior to Admission Living Arrangement: Home Anticipated Discharge Location: Home Barriers to Discharge: None Dispo: Anticipated discharge today.  Signed: Lacinda Axon, MD 05/27/2020, 6:11 AM  Pager: [phone] Internal Medicine Teaching Service After 5pm on weekdays and 1pm on weekends: On Call pager: [phone]

## 2020-05-27 NOTE — Progress Notes (Signed)
Pt returned from radiology.

## 2020-05-28 ENCOUNTER — Telehealth: Payer: Self-pay | Admitting: Dietician

## 2020-05-28 ENCOUNTER — Encounter: Payer: Self-pay | Admitting: Gastroenterology

## 2020-05-28 DIAGNOSIS — E1159 Type 2 diabetes mellitus with other circulatory complications: Secondary | ICD-10-CM

## 2020-05-28 DIAGNOSIS — N521 Erectile dysfunction due to diseases classified elsewhere: Secondary | ICD-10-CM

## 2020-05-28 LAB — AFP TUMOR MARKER: AFP, Serum, Tumor Marker: 2.4 ng/mL (ref 0.0–8.3)

## 2020-05-28 MED ORDER — ACCU-CHEK SOFTCLIX LANCETS MISC: 2 refills | Status: AC

## 2020-05-28 MED ORDER — ACCU-CHEK GUIDE W/DEVICE KIT: PACK | 1 refills | Status: AC

## 2020-05-28 MED ORDER — ACCU-CHEK GUIDE VI STRP: ORAL_STRIP | 2 refills | Status: AC

## 2020-05-28 NOTE — Telephone Encounter (Signed)
Steroid-Induced Hyperglycemia Prevention and Management DIOGO ANNE is a 59 y.o. male who meets criteria for [facility] glucose monitoring program (diabetes patient prescribed short course of steroids).  A/P Current Regimen  Patient prescribed decadron mg twice daily x 4mg  days, currently on day 2 of therapy. Patient taking prednisone in the AM  Prednisone indication: biliary tract neoplasm  Current DM regimen metformin  Home BG Monitoring  Patient does not have a meter at home and does not check BG at home. Meter was supplied. CBGs prior to steroid course unknown, A1C prior to steroid course  Lab Results  Component Value Date   HGBA1C 6.4 (A) 03/03/2020     S/Sx of hyper- or hypoglycemia: none, none  Medication Management  Switch prednisone dose to AM no  Additional treatment for BG control is not indicated at this time.  Physician preference level per protocol: 1  Patient Education  Advised patient to monitor BG while on steroid therapy (at least twice daily prior to first 2 meals of the day).  Patient educated about signs/symptoms and advised to contact clinic if hyper- or hypoglycemic.  Patient did  verbalize understanding of information and regimen by repeating back topics discussed.  Follow-up MOnday to find out how his blood sugars are doing  suggest 2 weeks  [name] Tag Wurtz 3:17 PM 05/28/2020

## 2020-05-31 NOTE — Telephone Encounter (Signed)
Prevention of steroid induced hyperglycemia follow up:   Steroids: Blood sugar: He says he is checking his blood sugar but cannot read the numbers; he does not "understand what he is looking at" and there is no one at the house who can help him . He will bring his meter with him to his appointment on Wednesday with Dr. Eileen Stanford Call was cut off so I did not get a chance to ask about his steroid  Taking or signs and symptoms of hyperglycemia.   Debera Lat, RD 05/31/2020 5:50 PM.

## 2020-06-01 ENCOUNTER — Inpatient Hospital Stay: Payer: Medicare HMO

## 2020-06-01 ENCOUNTER — Telehealth: Payer: Self-pay | Admitting: *Deleted

## 2020-06-01 ENCOUNTER — Other Ambulatory Visit: Payer: Self-pay

## 2020-06-01 ENCOUNTER — Inpatient Hospital Stay: Payer: Medicare HMO | Attending: Hematology and Oncology | Admitting: Hematology and Oncology

## 2020-06-01 VITALS — BP 124/63 | HR 66 | Temp 97.3°F | Resp 18 | Ht 64.0 in | Wt 150.8 lb

## 2020-06-01 DIAGNOSIS — D539 Nutritional anemia, unspecified: Secondary | ICD-10-CM

## 2020-06-01 DIAGNOSIS — C249 Malignant neoplasm of biliary tract, unspecified: Secondary | ICD-10-CM | POA: Insufficient documentation

## 2020-06-01 DIAGNOSIS — Z7952 Long term (current) use of systemic steroids: Secondary | ICD-10-CM | POA: Diagnosis not present

## 2020-06-01 DIAGNOSIS — E1165 Type 2 diabetes mellitus with hyperglycemia: Secondary | ICD-10-CM | POA: Insufficient documentation

## 2020-06-01 DIAGNOSIS — R748 Abnormal levels of other serum enzymes: Secondary | ICD-10-CM | POA: Diagnosis not present

## 2020-06-01 DIAGNOSIS — Z5111 Encounter for antineoplastic chemotherapy: Secondary | ICD-10-CM | POA: Diagnosis not present

## 2020-06-01 DIAGNOSIS — Z7189 Other specified counseling: Secondary | ICD-10-CM

## 2020-06-01 DIAGNOSIS — N183 Chronic kidney disease, stage 3 unspecified: Secondary | ICD-10-CM | POA: Diagnosis not present

## 2020-06-01 DIAGNOSIS — Z79899 Other long term (current) drug therapy: Secondary | ICD-10-CM | POA: Diagnosis not present

## 2020-06-01 DIAGNOSIS — Z7984 Long term (current) use of oral hypoglycemic drugs: Secondary | ICD-10-CM | POA: Insufficient documentation

## 2020-06-01 DIAGNOSIS — Z4682 Encounter for fitting and adjustment of non-vascular catheter: Secondary | ICD-10-CM | POA: Diagnosis not present

## 2020-06-01 DIAGNOSIS — Z7982 Long term (current) use of aspirin: Secondary | ICD-10-CM | POA: Diagnosis not present

## 2020-06-01 LAB — CBC WITH DIFFERENTIAL/PLATELET
Abs Immature Granulocytes: 0.12 10*3/uL — ABNORMAL HIGH (ref 0.00–0.07)
Basophils Absolute: 0 10*3/uL (ref 0.0–0.1)
Basophils Relative: 0 %
Eosinophils Absolute: 0 10*3/uL (ref 0.0–0.5)
Eosinophils Relative: 0 %
HCT: 27.5 % — ABNORMAL LOW (ref 39.0–52.0)
Hemoglobin: 8.8 g/dL — ABNORMAL LOW (ref 13.0–17.0)
Immature Granulocytes: 1 %
Lymphocytes Relative: 10 %
Lymphs Abs: 1.5 10*3/uL (ref 0.7–4.0)
MCH: 29 pg (ref 26.0–34.0)
MCHC: 32 g/dL (ref 30.0–36.0)
MCV: 90.8 fL (ref 80.0–100.0)
Monocytes Absolute: 1.1 10*3/uL — ABNORMAL HIGH (ref 0.1–1.0)
Monocytes Relative: 7 %
Neutro Abs: 12.8 10*3/uL — ABNORMAL HIGH (ref 1.7–7.7)
Neutrophils Relative %: 82 %
Platelets: 720 10*3/uL — ABNORMAL HIGH (ref 150–400)
RBC: 3.03 MIL/uL — ABNORMAL LOW (ref 4.22–5.81)
RDW: 19.6 % — ABNORMAL HIGH (ref 11.5–15.5)
WBC: 15.5 10*3/uL — ABNORMAL HIGH (ref 4.0–10.5)
nRBC: 0 % (ref 0.0–0.2)

## 2020-06-01 LAB — COMPREHENSIVE METABOLIC PANEL
ALT: 87 U/L — ABNORMAL HIGH (ref 0–44)
AST: 63 U/L — ABNORMAL HIGH (ref 15–41)
Albumin: 2.2 g/dL — ABNORMAL LOW (ref 3.5–5.0)
Alkaline Phosphatase: 546 U/L — ABNORMAL HIGH (ref 38–126)
Anion gap: 7 (ref 5–15)
BUN: 32 mg/dL — ABNORMAL HIGH (ref 8–23)
CO2: 27 mmol/L (ref 22–32)
Calcium: 9.9 mg/dL (ref 8.9–10.3)
Chloride: 102 mmol/L (ref 98–111)
Creatinine, Ser: 1.08 mg/dL (ref 0.61–1.24)
GFR, Estimated: >60 mL/min (ref >60)
Glucose, Bld: 213 mg/dL — ABNORMAL HIGH (ref 70–99)
Potassium: 4.3 mmol/L (ref 3.5–5.1)
Sodium: 136 mmol/L (ref 135–145)
Total Bilirubin: 2.8 mg/dL — ABNORMAL HIGH (ref 0.3–1.2)
Total Protein: 7.7 g/dL (ref 6.5–8.1)

## 2020-06-01 LAB — ABO/RH: ABO/RH(D): A POS

## 2020-06-01 LAB — SAMPLE TO BLOOD BANK

## 2020-06-01 MED ORDER — PROCHLORPERAZINE MALEATE 10 MG PO TABS: 10.0000 mg | ORAL_TABLET | Freq: Four times a day (QID) | ORAL | 1 refills | Status: AC | PRN

## 2020-06-01 MED ORDER — ONDANSETRON HCL 8 MG PO TABS: 8.0000 mg | ORAL_TABLET | Freq: Three times a day (TID) | ORAL | 1 refills | Status: AC | PRN

## 2020-06-01 NOTE — Telephone Encounter (Signed)
Thanks

## 2020-06-01 NOTE — Progress Notes (Signed)
START OFF PATHWAY REGIMEN - Other   [id number]:GemOx q14 Days (T-cell Lymphoma):   A cycle is every 14 days:     Gemcitabine      Oxaliplatin   **Always confirm dose/schedule in your pharmacy ordering system**  Patient Characteristics: Intent of Therapy: Non-Curative / Palliative Intent, Discussed with Patient

## 2020-06-01 NOTE — Telephone Encounter (Signed)
Pt Hgb 8.8. Dr.Gorsuch made aware

## 2020-06-01 NOTE — Assessment & Plan Note (Addendum)
Based on recent additional immunostains, the patient has unresectable hepatobiliary cancer I will get pathologist to add the MMR or MSI testing to see if pembrolizumab would be an option in the future I explained to the patient why his disease is not resectable I am concerned about the lack of drainage coming from his percutaneous drain We discussed treatment options We discussed the palliative nature of chemotherapy I advised the patient to share his diagnosis with family members We reviewed the current guidelines We discussed treatment options including gemcitabine with cisplatin versus gemcitabine with oxaliplatin The patient prefer gemcitabine with oxaliplatin We discussed the risk, benefits, side effects of treatment including risk of pancytopenia, infection, peripheral neuropathy, nausea, etc. and he is in agreement to proceed I will schedule chemo education class and port placement He would need very close follow-up and monitoring of blood count while on treatment Due to his severe progressive malignant hypercalcemia, I will try to get him started on chemotherapy as soon as possible We will get port placement whenever possible I will see him again next week for further follow-up

## 2020-06-02 ENCOUNTER — Other Ambulatory Visit: Payer: Self-pay

## 2020-06-02 ENCOUNTER — Encounter: Payer: Self-pay | Admitting: Internal Medicine

## 2020-06-02 ENCOUNTER — Telehealth: Payer: Self-pay

## 2020-06-02 ENCOUNTER — Ambulatory Visit (INDEPENDENT_AMBULATORY_CARE_PROVIDER_SITE_OTHER): Payer: Medicare HMO | Admitting: Internal Medicine

## 2020-06-02 ENCOUNTER — Encounter: Payer: Self-pay | Admitting: Hematology and Oncology

## 2020-06-02 DIAGNOSIS — C249 Malignant neoplasm of biliary tract, unspecified: Secondary | ICD-10-CM

## 2020-06-02 DIAGNOSIS — I10 Essential (primary) hypertension: Secondary | ICD-10-CM

## 2020-06-02 DIAGNOSIS — D539 Nutritional anemia, unspecified: Secondary | ICD-10-CM

## 2020-06-02 DIAGNOSIS — E1159 Type 2 diabetes mellitus with other circulatory complications: Secondary | ICD-10-CM

## 2020-06-02 DIAGNOSIS — N521 Erectile dysfunction due to diseases classified elsewhere: Secondary | ICD-10-CM

## 2020-06-02 NOTE — Assessment & Plan Note (Addendum)
Hypertension: Blood pressure stable.  He is supposed to be on amlodipine, losartan and metoprolol.  BP Readings from Last 3 Encounters:  06/02/20 (!) 113/50  06/01/20 124/63  05/27/20 137/68   Plan: -Continue to hold amlodipine and losartan -Only take metoprolol 50 mg daily to avoid reflex tachycardia

## 2020-06-02 NOTE — Progress Notes (Signed)
   CC: Follow-up adenocarcinoma of the biliary tract  HPI:  [name] is a 59 y.o. with medical history significant for HIV, hypertension, type 2 diabetes mellitus and recently diagnosed  Please see problem based charting for further details.  Past Medical History:  Diagnosis Date  . Anemia   . Anemia due to medication 09/24/2015  . Arthritis    "feet" (11/19/2015)  . CAD (coronary artery disease), native coronary artery    a. remote LAD stent 1990s. b. s/p DESx2 to LAD 11/2015  . Hepatitis A immune 11/20/2016  . Hepatitis B immune 11/20/2016  . Hepatitis C    "going thru the treatments" (11/19/2015)  . High cholesterol   . History of gout    "just recently" (11/19/2015)  . HIV (human immunodeficiency virus infection) ([place])   . Hypertension   . Posterior pain of left hip 03/01/2018  . Type II diabetes mellitus ([place])    diagnosed 04-2015   Review of Systems:  As per HPI  Physical Exam:  Vitals:   06/02/20 0920  BP: (!) 113/50  Pulse: (!) 59  Temp: 97.8 F (36.6 C)  TempSrc: Oral  SpO2: 100%  Weight: 153 lb 1.6 oz (69.4 kg)  Height: 5\' 5"  (1.651 m)   Physical Exam Vitals and nursing note reviewed.  Cardiovascular:     Rate and Rhythm: Bradycardia present.     Heart sounds: Normal heart sounds.  Pulmonary:     Breath sounds: Normal breath sounds.  Abdominal:     General: Bowel sounds are normal.     Comments: Percutaneous biliary drain in place with minimal output.  Psychiatric:        Mood and Affect: Mood is not depressed. Affect is angry. Affect is not flat.     Assessment & Plan:   See Encounters Tab for problem based charting.  Patient discussed with Dr. Rebeca Alert

## 2020-06-02 NOTE — Assessment & Plan Note (Signed)
Hypercalcemia: He was also noted to have an elevated calcium level of 14 which improved to 9.3 on discharge with fluids, calcitonin.  His PTH level was unremarkable, PTH RP was normal.  On discussion with oncology, they had recognized for patient to be discharged on dexamethasone 4 mg twice daily.

## 2020-06-02 NOTE — Assessment & Plan Note (Signed)
Normocytic anemia: Hemoglobin stable at 8.8 and actually improved

## 2020-06-02 NOTE — Patient Instructions (Addendum)
John Dickerson,  Thanks for seeing me today.  Please follow-up with oncology and gastroenterology.  For hypertension, please only take metoprolol 50 mg daily and hold the amlodipine and losartan.  For diabetes, please continue checking your blood sugar and only take the Metformin.  Your blood sugar will be high because of the Decadron but you have several days left.  If your blood sugar goes above 250 AND you begin to feel nausea, vomiting and just unwell, please call us or report to the emergency department.  In the meantime, please drink plenty of water  Dr. Eileen Stanford  Please call the internal medicine center clinic if you have any questions or concerns, we may be able to help and keep you from a long and expensive emergency room wait. Our clinic and after hours phone number is [phone], the best time to call is Monday through Friday 9 am to 4 pm but there is always someone available 24/7 if you have an emergency. If you need medication refills please notify your pharmacy one week in advance and they will send [place] a request.   If you have not gotten the COVID vaccine, I recommend doing so:  You may get it at your local [facility] or [facility] OR To schedule an appointment for a COVID vaccine or be added to the vaccine wait list: Go to [email or web address]   OR Go to [email or web address]                  OR Call [phone]                                     OR Call [phone] and select Option 2

## 2020-06-02 NOTE — Telephone Encounter (Signed)
Called and offered a 8 am appt for treatment tomorrow. Ask him to arrive at 0730 for appt. Instructed to hydrate with lots of fluids today. He is agreeable and aware of appt times.

## 2020-06-02 NOTE — Assessment & Plan Note (Signed)
Hepatobiliary cancer: He was recently admitted to the hospital from May 19, 2020 to May 27, 2020 initially with painless jaundice and worsening liver enzymes as well as bilirubin.  During his hospitalization, he underwent an MRCP which showed a masslike area in the central portion of the liver that was obstructing his biliary ducts, common hepatic duct and narrowing of his portal vein.  Gastroenterology was consulted who performed an EUS/ERCP with biopsy.  He also had 2 plastic stents placed into the left hepatic duct system with good drainage.  His LFTs normalized.  His CA 19-9 was elevated.  Pathology from the EUS with brushing showed poorly differentiated carcinoma that favored adenocarcinoma of his biliary tract.  Due to concern for possible metastasis, CT chest was ordered which showed no evidence of lung mass.  He was evaluated by oncology (Dr. Alvy Bimler) yesterday who made the patient aware that his hepatobiliary cancer is not resectable.  They are currently discussing immunotherapy and chemotherapy with gemcitabine/oxaliplatin as means of palliative chemotherapy.  He states the his biliary drain has only minimal green output  His labs performed yesterday during his oncology visit showed, downtrending alkaline phosphatase, stable elevated AST/ALT.  CBC showed leukocytosis with WBC of 15 likely secondary to steroid use and stable hemoglobin of 8.8.  Plan: -Follow-up with oncology for ongoing discussion of palliative chemotherapy with gemcitabine/oxaliplatin or immunotherapy -Follow-up with GI for repeat ERCP with elective biliary stent exchange

## 2020-06-02 NOTE — Assessment & Plan Note (Addendum)
His liver enzymes are trending lower I plan upfront dose adjustment for chemotherapy and I will monitor that carefully Once chemotherapy started, I am hopeful we can get his percutaneous drainage tube removed I recommend discontinuation of Lipitor

## 2020-06-02 NOTE — Telephone Encounter (Signed)
metoprolol succinate (TOPROL-XL) 100 MG 24 hr tablet, refill request @  [facility] [phone] - [place], [address] AT [address] Phone:  [phone]  Fax:  [phone]

## 2020-06-02 NOTE — Assessment & Plan Note (Signed)
This is likely anemia of chronic disease. The patient denies recent history of bleeding such as epistaxis, hematuria or hematochezia. He is asymptomatic from the anemia. We will observe for now.  He does not require transfusion now. I do not recommend any further work-up at this time.   

## 2020-06-02 NOTE — Progress Notes (Signed)
[facility] OFFICE PROGRESS NOTE  Patient Care Team: Jean Rosenthal, MD as PCP - General [name], MD as PCP - Infectious Diseases (Infectious Diseases) Jettie Booze, MD as PCP - Cardiology (Cardiology) [name], [name], [place] as Consulting Physician (Optometry)  ASSESSMENT & PLAN:  Hepatobiliary cancer [facility]) Based on recent additional immunostains, the patient has unresectable hepatobiliary cancer I will get pathologist to add the MMR or MSI testing to see if pembrolizumab would be an option in the future I explained to the patient why his disease is not resectable I am concerned about the lack of drainage coming from his percutaneous drain We discussed treatment options We discussed the palliative nature of chemotherapy I advised the patient to share his diagnosis with family members We reviewed the current guidelines We discussed treatment options including gemcitabine with cisplatin versus gemcitabine with oxaliplatin The patient prefer gemcitabine with oxaliplatin We discussed the risk, benefits, side effects of treatment including risk of pancytopenia, infection, peripheral neuropathy, nausea, etc. and he is in agreement to proceed I will schedule chemo education class and port placement He would need very close follow-up and monitoring of blood count while on treatment Due to his severe progressive malignant hypercalcemia, I will try to get him started on chemotherapy as soon as possible We will get port placement whenever possible I will see him again next week for further follow-up  Hypercalcemia of malignancy There is a component of dehydration I recommend increase oral fluid intake He will continue low-dose dexamethasone I will try to get him started on chemotherapy as soon as possible  Normocytic anemia This is likely anemia of chronic disease. The patient denies recent history of bleeding such as epistaxis, hematuria or hematochezia. He is  asymptomatic from the anemia. We will observe for now.  He does not require transfusion now. I do not recommend any further work-up at this time.    Elevated liver enzymes His liver enzymes are trending lower I plan upfront dose adjustment for chemotherapy and I will monitor that carefully Once chemotherapy started, I am hopeful we can get his percutaneous drainage tube removed I recommend discontinuation of Lipitor  CKD (chronic kidney disease) stage 3, GFR 30-59 ml/min (HCC) His kidney function is better I recommend increase oral intake of fluids as tolerated  Goals of care, counseling/discussion We discussed prognosis I recommend him to involve family members for additional emotional support With chemotherapy, if he responds well, overall median survival is somewhere around 1-1/2 to 2 years Without treatment, he was succumbed to the disease in less than 6 months    Orders Placed This Encounter  Procedures  . IR IMAGING GUIDED PORT INSERTION    Standing Status:   Future    Standing Expiration Date:   06/01/2021    Order Specific Question:   Reason for Exam (SYMPTOM  OR DIAGNOSIS REQUIRED)    Answer:   need port for chemo to start next week    Order Specific Question:   Preferred Imaging Location?    Answer:   [facility]  . Comprehensive metabolic panel    Standing Status:   Standing    Number of Occurrences:   33    Standing Expiration Date:   06/01/2021  . CBC with Differential/Platelet    Standing Status:   Standing    Number of Occurrences:   22    Standing Expiration Date:   06/01/2021  . CBC with Differential ([place])  Standing Status:   Standing    Number of Occurrences:   20    Standing Expiration Date:   06/01/2021  . CMP ([place] only)    Standing Status:   Standing    Number of Occurrences:   20    Standing Expiration Date:   06/01/2021  . Cancer antigen 19-9    Standing Status:   Standing    Number of Occurrences:   33     Standing Expiration Date:   06/01/2021  . Sample to Blood Bank    Standing Status:   Standing    Number of Occurrences:   33    Standing Expiration Date:   06/01/2021  . ABO/RH    Standing Status:   Future    Number of Occurrences:   1    Standing Expiration Date:   06/01/2021    All questions were answered. The patient knows to call the clinic with any problems, questions or concerns. The total time spent in the appointment was 90 minutes encounter with patients including review of chart and various tests results, discussions about plan of care and coordination of care plan   [name], MD 06/02/2020 12:36 PM  INTERVAL HISTORY: Please see below for problem oriented charting. The patient has a very prolonged visit lasting over an hour and a half due to complex medical history and the need for additional work-up including blood work and chemo education class The patient came to the office by himself He has not discussed results and his recent diagnosis with his wife Since discharge from the hospital, he was not able to get his glucometer working He thought that his blood sugar was in the 100 range but he did not document that He is not drinking enough fluid and has some constipation recently He felt that the drainage tube is not working right and is not draining much He denies abdominal pain or nausea   SUMMARY OF ONCOLOGIC HISTORY: Oncology History  Hepatobiliary cancer ([place])  05/20/2020 Imaging   MRI with MRCP 1. Masslike area in the central liver obstructing bile ducts, common hepatic duct and narrowing the portal vein extending from the gallbladder fossa a highly suspicious for aggressive biliary neoplasm/cholangiocarcinoma/gallbladder carcinoma. Superimposed infection is also possible. 2. Pancreas is largely normal but inseparable from enhancing soft tissue that extends from the porta hepatis inferiorly and with irregular enhancement that surrounds an area of presumed necrotic  tumor and or infected necrotic debris. 3. Separate smaller area showing heterogeneous enhancement likely reflects additional site of tumor in the liver in the inferior medial LEFT hepatic lobe. 4. Small volume ascites about the liver. 5. Biliary drainage tube appears to remain in place, not well evaluated except on MRCP sequences were a can be faintly seen   05/22/2020 Pathology Results   FINAL MICROSCOPIC DIAGNOSIS:   A. DUODENUM, BIOPSY:  - Duodenal mucosa with no significant pathologic findings.  - Negative for increased intraepithelial lymphocytes and villous  architectural changes.   B. STOMACH, BIOPSY:  - Gastric antral mucosa with mild reactive gastropathy.  - Gastric oxyntic mucosa with mild chronic gastritis.  - Warthin-Starry stain is negative for Helicobacter pylori.   C. BILIARY TISSUE, BIOPSY:  - Poorly differentiated carcinoma, favor adenocarcinoma.   ADDENDUM:   (C) Immunohistochemistry for MOC31 and CK7 is positive.  CK20, CDX-2, HEPAR-1, Glypican-3 and Arginase-1 are negative.  The immunophenotype favors pancreaticobiliary origin in this otherwise poorly differentiated carcinoma.      05/27/2020 Imaging  CT chest 1. Hypoenhancing and potentially centrally necrotic mass along the gallbladder fossa involving the right hepatic lobe and left hepatic lobe, with 2 biliary stents partially included on today's examination, and a small amount of pneumobilia. 2. No findings of metastatic disease to the chest. 3. Coronary, aortic arch, and branch vessel atherosclerotic vascular disease. 4. Paraseptal emphysema. 5. Mild bilateral gynecomastia. 6. Lipoma between the right pectoralis major and minor muscles. 7. Emphysema and aortic atherosclerosis.   05/27/2020 Imaging   CT abdomen and pelvis 1. Stable appearing ill-defined infiltrating neoplastic process in the central liver inferiorly. This is most likely due to an infiltrating biliary neoplasm (cholangiocarcinoma). 2.  Resolution of intrahepatic biliary dilatation due to the biliary stent. 3. Stable marked narrowing and irregularity of the middle and right portal veins with possible partial thrombosis. 4. Age advanced atherosclerotic calcifications involving the aorta and branch vessels. 5. Small left pleural effusion with overlying atelectasis. 6. Aortic atherosclerosis.   06/01/2020 Initial Diagnosis   Hepatobiliary cancer ([place])   06/01/2020 Cancer Staging   Staging form: Distal Bile Duct, AJCC 8th Edition - Clinical stage from 06/01/2020: Stage IIB (cT2, cN1, cM0) - Signed by [name], MD on 06/01/2020     REVIEW OF SYSTEMS:   Constitutional: Denies fevers, chills or abnormal weight loss Eyes: Denies blurriness of vision Ears, nose, mouth, throat, and face: Denies mucositis or sore throat Respiratory: Denies cough, dyspnea or wheezes Cardiovascular: Denies palpitation, chest discomfort or lower extremity swelling Skin: Denies abnormal skin rashes Lymphatics: Denies new lymphadenopathy or easy bruising Neurological:Denies numbness, tingling or new weaknesses Behavioral/Psych: Mood is stable, no new changes  All other systems were reviewed with the patient and are negative.  I have reviewed the past medical history, past surgical history, social history and family history with the patient and they are unchanged from previous note.  ALLERGIES:  has No Known Allergies.  MEDICATIONS:  Current Outpatient Medications  Medication Sig Dispense Refill  . Accu-Chek Softclix Lancets lancets Check blood sugars 2 times a day while on steroids 100 each 2  . allopurinol (ZYLOPRIM) 100 MG tablet TAKE 1 AND 1/2 TABLETS(150 MG) BY MOUTH DAILY (Patient taking differently: Take 150 mg by mouth daily. ) 135 tablet 1  . amLODipine (NORVASC) 10 MG tablet Take 1 tablet (10 mg total) by mouth daily. 90 tablet 3  . aspirin EC 81 MG tablet Take 1 tablet (81 mg total) by mouth daily. 90 tablet 3  . BIKTARVY  50-200-25 MG TABS tablet TAKE 1 TABLET BY MOUTH DAILY (Patient taking differently: Take 1 tablet by mouth daily. ) 90 tablet 1  . blood glucose meter kit and supplies KIT Dispense based on patient and insurance preference. Use up to four times daily as directed. (FOR ICD-9 250.00, 250.01). 1 each 0  . Blood Glucose Monitoring Suppl (ACCU-CHEK GUIDE) w/Device KIT Check blood sugar 2 times a day while on steroids 1 kit 1  . cholecalciferol (VITAMIN D) 25 MCG tablet Take 1 tablet (1,000 Units total) by mouth daily. 60 tablet 0  . Continuous Blood Gluc Receiver (FREESTYLE LIBRE 2 READER SYSTM) DEVI 1 Device by Does not apply route 3 (three) times daily. 1 Device 0  . Continuous Blood Gluc Sensor (FREESTYLE LIBRE 2 SENSOR SYSTM) MISC 1 Device by Does not apply route 3 (three) times daily. 1 each 0  . dexamethasone (DECADRON) 4 MG tablet Take 1 tablet (4 mg total) by mouth 2 (two) times daily for 14 days. 28 tablet 0  .  glucose blood (ACCU-CHEK GUIDE) test strip Check blood sugar 2 times per day while on steroids 100 each 2  . losartan (COZAAR) 50 MG tablet Take 1 tablet (50 mg total) by mouth daily. 90 tablet 3  . metFORMIN (GLUCOPHAGE) 1000 MG tablet TAKE 1 TABLET BY MOUTH TWICE DAILY WITH A MEAL (Patient taking differently: Take 1,000 mg by mouth 2 (two) times daily with a meal. ) 180 tablet 1  . metoprolol succinate (TOPROL-XL) 100 MG 24 hr tablet TAKE 1 TABLET BY MOUTH DAILY WITH OR IMMEDAITELY FOLLOWING A MEAL (Patient taking differently: Take 100 mg by mouth daily. ) 90 tablet 6  . Multiple Vitamin (MULTIVITAMIN WITH MINERALS) TABS tablet Take 1 tablet by mouth daily.    . nitroGLYCERIN (NITROSTAT) 0.4 MG SL tablet Place 1 tablet (0.4 mg total) under the tongue every 5 (five) minutes as needed for chest pain. 25 tablet 3  . ondansetron (ZOFRAN) 8 MG tablet Take 1 tablet (8 mg total) by mouth every 8 (eight) hours as needed for refractory nausea / vomiting. Start on day 3 after chemotherapy. 30 tablet 1   . ONETOUCH DELICA LANCETS FINE MISC Check blood sugar up to 1 time a day 100 each 12  . prochlorperazine (COMPAZINE) 10 MG tablet Take 1 tablet (10 mg total) by mouth every 6 (six) hours as needed (Nausea or vomiting). 30 tablet 1   No current facility-administered medications for this visit.    PHYSICAL EXAMINATION: ECOG PERFORMANCE STATUS: 1 - Symptomatic but completely ambulatory  Vitals:   06/01/20 1409  BP: 124/63  Pulse: 66  Resp: 18  Temp: (!) 97.3 F (36.3 C)  SpO2: 100%   Filed Weights   06/01/20 1409  Weight: 150 lb 12.8 oz (68.4 kg)    GENERAL:alert, no distress and comfortable.  He looks jaundiced SKIN: skin color, texture, turgor are normal, no rashes or significant lesions EYES: Noted scleral icterus OROPHARYNX:no exudate, no erythema and lips, buccal mucosa, and tongue normal  NECK: supple, thyroid normal size, non-tender, without nodularity LYMPH:  no palpable lymphadenopathy in the cervical, axillary or inguinal LUNGS: clear to auscultation and percussion with normal breathing effort HEART: regular rate & rhythm and no murmurs and no lower extremity edema ABDOMEN:abdomen soft, probable right upper quadrant mass Musculoskeletal:no cyanosis of digits and no clubbing  NEURO: alert & oriented x 3 with fluent speech, no focal motor/sensory deficits  LABORATORY DATA:  I have reviewed the data as listed    Component Value Date/Time   NA 136 06/01/2020 1433   NA 138 03/30/2020 0938   K 4.3 06/01/2020 1433   CL 102 06/01/2020 1433   CO2 27 06/01/2020 1433   GLUCOSE 213 (H) 06/01/2020 1433   BUN 32 (H) 06/01/2020 1433   BUN 20 03/30/2020 0938   CREATININE 1.08 06/01/2020 1433   CREATININE 1.24 03/25/2020 0928   CALCIUM 9.9 06/01/2020 1433   PROT 7.7 06/01/2020 1433   PROT 7.4 03/30/2020 0938   ALBUMIN 2.2 (L) 06/01/2020 1433   ALBUMIN 4.2 03/30/2020 0938   AST 63 (H) 06/01/2020 1433   ALT 87 (H) 06/01/2020 1433   ALKPHOS 546 (H) 06/01/2020 1433    BILITOT 2.8 (H) 06/01/2020 1433   BILITOT 0.2 03/30/2020 0938   GFRNONAA >60 06/01/2020 1433   GFRNONAA 62 03/25/2020 0928   GFRAA 83 03/30/2020 0938   GFRAA 72 03/25/2020 0928    No results found for: SPEP, UPEP  Lab Results  Component Value Date   WBC 15.5 (  H) 06/01/2020   NEUTROABS 12.8 (H) 06/01/2020   HGB 8.8 (L) 06/01/2020   HCT 27.5 (L) 06/01/2020   MCV 90.8 06/01/2020   PLT 720 (H) 06/01/2020      Chemistry      Component Value Date/Time   NA 136 06/01/2020 1433   NA 138 03/30/2020 0938   K 4.3 06/01/2020 1433   CL 102 06/01/2020 1433   CO2 27 06/01/2020 1433   BUN 32 (H) 06/01/2020 1433   BUN 20 03/30/2020 0938   CREATININE 1.08 06/01/2020 1433   CREATININE 1.24 03/25/2020 0928      Component Value Date/Time   CALCIUM 9.9 06/01/2020 1433   ALKPHOS 546 (H) 06/01/2020 1433   AST 63 (H) 06/01/2020 1433   ALT 87 (H) 06/01/2020 1433   BILITOT 2.8 (H) 06/01/2020 1433   BILITOT 0.2 03/30/2020 0938       RADIOGRAPHIC STUDIES: I have personally reviewed the radiological images as listed and agreed with the findings in the report. CT ABDOMEN PELVIS W WO CONTRAST  Result Date: 05/27/2020 CLINICAL DATA:  Cancer of unknown primary. Staging liver disease. Poorly differentiated carcinoma. EXAM: CT ABDOMEN AND PELVIS WITHOUT AND WITH CONTRAST TECHNIQUE: Multidetector CT imaging of the abdomen and pelvis was performed following the standard protocol before and following the bolus administration of intravenous contrast. CONTRAST:  134m OMNIPAQUE IOHEXOL 300 MG/ML  SOLN COMPARISON:  03/05/2020 CT scan and MRI 05/20/2020. FINDINGS: Lower chest: The heart is within normal limits in size. No pericardial effusion. Age advanced aortic and coronary artery calcifications are noted. Small left pleural effusion overlying atelectasis. Minimal focus of right basilar atelectasis. Hepatobiliary: As demonstrated on the MRI examination there is an ill-defined infiltrating neoplastic process  in the central liver inferiorly. Heterogeneous peripheral enhancement mainly on the delayed images. Findings most likely due to an infiltrating biliary neoplasm (cholangiocarcinoma). The intrahepatic biliary dilatation has resolved due to the biliary stent. There is also a catheter in the gallbladder which is decompressed. Some gas is noted in the biliary tree which is not unexpected. Stable marked narrowing and irregularity of the middle and right portal veins with possible partial thrombosis. The left portal vein is patent. Pancreas: No mass, inflammation or ductal dilatation. Spleen: Normal size. No focal lesions. Adrenals/Urinary Tract: Adrenal glands and kidneys are unremarkable. No worrisome renal lesions or hydronephrosis. Bladder is normal. Stomach/Bowel: The stomach, duodenum, small bowel and colon are unremarkable. No acute inflammatory process, mass lesions or obstructive findings. The terminal ileum and appendix are normal. Vascular/Lymphatic: Age advanced atherosclerotic calcifications involving the aorta and branch vessels but no aneurysm or dissection. The branch vessels are patent. The major venous structures are patent. No abdominal lymphadenopathy, omental disease or peritoneal implants. Reproductive: The prostate gland and seminal vesicles are unremarkable. Other: No pelvic mass or adenopathy. No free pelvic fluid collections. No inguinal adenopathy. Musculoskeletal: No significant bony findings. IMPRESSION: 1. Stable appearing ill-defined infiltrating neoplastic process in the central liver inferiorly. This is most likely due to an infiltrating biliary neoplasm (cholangiocarcinoma). 2. Resolution of intrahepatic biliary dilatation due to the biliary stent. 3. Stable marked narrowing and irregularity of the middle and right portal veins with possible partial thrombosis. 4. Age advanced atherosclerotic calcifications involving the aorta and branch vessels. 5. Small left pleural effusion with  overlying atelectasis. 6. Aortic atherosclerosis. Aortic Atherosclerosis (ICD10-I70.0). Electronically Signed   By: PMarijo SanesM.D.   On: 05/27/2020 05:22   CT CHEST W CONTRAST  Result Date: 05/27/2020 CLINICAL DATA:  Cancer  of unknown primary.  Liver mass. EXAM: CT CHEST WITH CONTRAST TECHNIQUE: Multidetector CT imaging of the chest was performed during intravenous contrast administration. CONTRAST:  82m OMNIPAQUE IOHEXOL 300 MG/ML  SOLN COMPARISON:  CT abdomen/pelvis from 05/27/2020 at 12:57 a.m. (Approximately 12 hours prior to this current examination) FINDINGS: Cardiovascular: Coronary, aortic arch, and branch vessel atherosclerotic vascular disease. Mediastinum/Nodes: No pathologic adenopathy observed. Mild bilateral gynecomastia. Lungs/Pleura: Paraseptal emphysema best. Appreciated along the apices 2 mm nodule along the confluence of the major and minor fissure on image 71 series 7, probably a benign subpleural lymph node. No findings of lung mass or specifically worrisome lung nodule. Mild scarring or subsegmental atelectasis anteriorly in the left lower lobe for example on image 93 of series 7. Upper Abdomen: Hypoenhancing and potentially centrally necrotic mass along the gallbladder fossa involving the right hepatic lobe and left hepatic lobe, with 2 biliary stents partially included on today's examination, and a small amount of pneumobilia. Pigtail catheter below the liver partially seen on today's examination. Abdominal aortic atherosclerotic calcification. Musculoskeletal: Lipoma between the right pectoralis major and minor muscles. IMPRESSION: 1. Hypoenhancing and potentially centrally necrotic mass along the gallbladder fossa involving the right hepatic lobe and left hepatic lobe, with 2 biliary stents partially included on today's examination, and a small amount of pneumobilia. 2. No findings of metastatic disease to the chest. 3. Coronary, aortic arch, and branch vessel atherosclerotic  vascular disease. 4. Paraseptal emphysema. 5. Mild bilateral gynecomastia. 6. Lipoma between the right pectoralis major and minor muscles. 7. Emphysema and aortic atherosclerosis. Aortic Atherosclerosis (ICD10-I70.0) and Emphysema (ICD10-J43.9). Electronically Signed   By: WVan ClinesM.D.   On: 05/27/2020 15:07   DG ERCP BILIARY & PANCREATIC DUCTS  Result Date: 05/22/2020 CLINICAL DATA:  ERCP. EXAM: ERCP TECHNIQUE: Multiple spot images obtained with the fluoroscopic device and submitted for interpretation post-procedure. FLUOROSCOPY TIME:  Fluoroscopy Time:  8 minutes and 21 seconds. Number of Acquired Spot Images: 23 COMPARISON:  MRI dated 05/20/2020 FINDINGS: Cannulation of the common bile duct with injection of contrast demonstrates intrahepatic biliary ductal dilatation. Subsequent images demonstrate placement 2 plastic biliary stents. IMPRESSION: ERCP with stent placement as detailed above. These images were submitted for radiologic interpretation only. Please see the procedural report for the amount of contrast and the fluoroscopy time utilized. Electronically Signed   By: CConstance HolsterM.D.   On: 05/22/2020 18:40   MR ABDOMEN MRCP W WO CONTAST  Result Date: 05/20/2020 CLINICAL DATA:  Painless jaundice in a 59year old male EXAM: MRI ABDOMEN WITHOUT AND WITH CONTRAST (INCLUDING MRCP) TECHNIQUE: Multiplanar multisequence MR imaging of the abdomen was performed both before and after the administration of intravenous contrast. Heavily T2-weighted images of the biliary and pancreatic ducts were obtained, and three-dimensional MRCP images were rendered by post processing. CONTRAST:  6.571mGADAVIST GADOBUTROL 1 MMOL/ML IV SOLN COMPARISON:  CT abdomen and pelvis from September of 2021 FINDINGS: Lower chest: Lung bases are clear, no consolidation or sign of pleural effusion. Limited assessment of lung bases on MRI. Hepatobiliary: Masslike appearance of the liver and a portion of the gallbladder.  Obstruction of the biliary tree at the porta hepatis. Is soft tissue with enhancement extends to the porta hepatis obstructing the biliary tree. And irregular masslike area with irregular peripheral enhancement extends from the gallbladder fossa into the porta hepatis where the bile duct shows transition. Biliary drainage catheter remains in place. This area measures 7.9 x 6.7 cm in greatest axial dimension with another area in hepatic  subsegment IV that measures 2.9 x 2.3 cm. Enhancement along the margin posteriorly with soft tissue measuring 3.5 x 2.3 cm on image 46 of series 30. Profound biliary duct distension. T1 signal with mild mixed signal in the area and nonenhancing central portion of this large area extending into the central liver. Portal vein is narrowed at the biliary confluence. This is near the site of biliary duct transition no additional hepatic lesion. Pilar Plate enhancing soft tissue just above the pancreatic head is seen on image 48 of series 32 Pancreas:  No sign of pancreatic inflammation or ductal dilation. Spleen:  Normal Adrenals/Urinary Tract: Adrenal glands are normal. Kidneys with smooth contours. No hydronephrosis. Stomach/Bowel: Gastrointestinal tract without acute process. No acute gastrointestinal process to the extent evaluated. Limited assessment on MRI. Vascular/Lymphatic: Normal caliber abdominal aorta. There is no gastrohepatic or hepatoduodenal ligament lymphadenopathy. No retroperitoneal or mesenteric lymphadenopathy. Other:  Small volume ascites about the liver. Musculoskeletal: No acute musculoskeletal process. No destructive bone finding on limited assessment. IMPRESSION: 1. Masslike area in the central liver obstructing bile ducts, common hepatic duct and narrowing the portal vein extending from the gallbladder fossa a highly suspicious for aggressive biliary neoplasm/cholangiocarcinoma/gallbladder carcinoma. Superimposed infection is also possible. 2. Pancreas is largely  normal but inseparable from enhancing soft tissue that extends from the porta hepatis inferiorly and with irregular enhancement that surrounds an area of presumed necrotic tumor and or infected necrotic debris. 3. Separate smaller area showing heterogeneous enhancement likely reflects additional site of tumor in the liver in the inferior medial LEFT hepatic lobe. 4. Small volume ascites about the liver. 5. Biliary drainage tube appears to remain in place, not well evaluated except on MRCP sequences were a can be faintly seen. Electronically Signed   By: Zetta Bills M.D.   On: 05/20/2020 17:55

## 2020-06-02 NOTE — Assessment & Plan Note (Signed)
There is a component of dehydration I recommend increase oral fluid intake He will continue low-dose dexamethasone I will try to get him started on chemotherapy as soon as possible

## 2020-06-02 NOTE — Assessment & Plan Note (Signed)
We discussed prognosis I recommend him to involve family members for additional emotional support With chemotherapy, if he responds well, overall median survival is somewhere around 1-1/2 to 2 years Without treatment, he was succumbed to the disease in less than 6 months

## 2020-06-02 NOTE — Assessment & Plan Note (Addendum)
We will try not to be aggressive with his diabetes management given the uncertain nature of his recent hepatobiliary cancer diagnosis with prognosis of 1 to 2 years with treatment.  He is in discussion of undergoing palliative chemotherapy or immunotherapy.  His last A1c was 6.4% in September.  He should be on his last days of Decadron.  Continue Metformin for now.  He will have a check in with [name] on Friday and if blood glucose is persistently high, will consider short course of glipizide

## 2020-06-02 NOTE — Assessment & Plan Note (Signed)
His kidney function is better I recommend increase oral intake of fluids as tolerated

## 2020-06-03 ENCOUNTER — Ambulatory Visit: Payer: Medicare HMO | Admitting: Hematology and Oncology

## 2020-06-03 ENCOUNTER — Inpatient Hospital Stay: Payer: Medicare HMO

## 2020-06-03 ENCOUNTER — Other Ambulatory Visit: Payer: Self-pay

## 2020-06-03 ENCOUNTER — Telehealth: Payer: Self-pay

## 2020-06-03 ENCOUNTER — Other Ambulatory Visit: Payer: Self-pay | Admitting: Hematology and Oncology

## 2020-06-03 VITALS — BP 115/61 | HR 74 | Temp 98.3°F | Resp 17 | Ht 65.0 in | Wt 149.5 lb

## 2020-06-03 DIAGNOSIS — Z7189 Other specified counseling: Secondary | ICD-10-CM

## 2020-06-03 DIAGNOSIS — Z5111 Encounter for antineoplastic chemotherapy: Secondary | ICD-10-CM | POA: Diagnosis not present

## 2020-06-03 DIAGNOSIS — C249 Malignant neoplasm of biliary tract, unspecified: Secondary | ICD-10-CM

## 2020-06-03 LAB — SURGICAL PATHOLOGY

## 2020-06-03 MED ORDER — PALONOSETRON HCL INJECTION 0.25 MG/5ML
INTRAVENOUS | Status: AC
  Filled 2020-06-03: qty 5

## 2020-06-03 MED ORDER — SODIUM CHLORIDE 0.9 % IV SOLN
600.0000 mg/m2 | Freq: Once | INTRAVENOUS | Status: AC
  Administered 2020-06-03: 10:00:00 1064 mg via INTRAVENOUS
  Filled 2020-06-03: qty 27.98

## 2020-06-03 MED ORDER — DEXTROSE 5 % IV SOLN
Freq: Once | INTRAVENOUS | Status: AC
  Filled 2020-06-03: qty 250

## 2020-06-03 MED ORDER — SODIUM CHLORIDE 0.9 % IV SOLN
10.0000 mg | Freq: Once | INTRAVENOUS | Status: AC
  Administered 2020-06-03: 09:00:00 10 mg via INTRAVENOUS
  Filled 2020-06-03: qty 10

## 2020-06-03 MED ORDER — METOPROLOL SUCCINATE ER 50 MG PO TB24: ORAL_TABLET | ORAL | 3 refills | Status: AC

## 2020-06-03 MED ORDER — PALONOSETRON HCL INJECTION 0.25 MG/5ML
0.2500 mg | Freq: Once | INTRAVENOUS | Status: AC
  Administered 2020-06-03: 09:00:00 0.25 mg via INTRAVENOUS

## 2020-06-03 MED ORDER — OXALIPLATIN CHEMO INJECTION 100 MG/20ML
85.0000 mg/m2 | Freq: Once | INTRAVENOUS | Status: AC
  Administered 2020-06-03: 11:00:00 150 mg via INTRAVENOUS
  Filled 2020-06-03: qty 10

## 2020-06-03 MED ORDER — SODIUM CHLORIDE 0.9 % IV SOLN
Freq: Once | INTRAVENOUS | Status: AC
  Filled 2020-06-03: qty 250

## 2020-06-03 NOTE — Telephone Encounter (Signed)
Reviewed port placement appt on 1/3, npo after midnight and you must have a driver. Arrive at 10 am to [facility] main entrance. Given a copy of schedule. He verbalized understanding.

## 2020-06-03 NOTE — Progress Notes (Signed)
Internal Medicine Clinic Attending  Case discussed with Dr. Agyei at the time of the visit.  We reviewed the resident's history and exam and pertinent patient test results.  I agree with the assessment, diagnosis, and plan of care documented in the resident's note.  Sharif Rendell, M.D., Ph.D.  

## 2020-06-03 NOTE — Telephone Encounter (Signed)
Prevention of steroid induced hyperglycemia follow up:    Met with John Dickerson yesterday during hisd doctor visit to download his meter and answer his questions. His blood sugars are elevated: 12/15- 363 (9:50 AM) 12/14- 215 ( 9:24 AM) 12/13:-   281(11:15 AM), 323 (1:57 PM) 393 (2:00PM) Plan- discussed with Dr. Eileen Stanford and it was decided not to treat his blood sugars as his dose of steroids will be decreasing soon. John Dickerson was advised to drink plenty of water. Will sign off for now.   Debera Lat, RD 06/03/2020 4:29 PM.

## 2020-06-03 NOTE — Patient Instructions (Signed)
Gemcitabine injection What is this medicine? GEMCITABINE (jem SYE ta been) is a chemotherapy drug. This medicine is used to treat many types of cancer like breast cancer, lung cancer, pancreatic cancer, and ovarian cancer. This medicine may be used for other purposes; ask your health care provider or pharmacist if you have questions. COMMON BRAND NAME(S): Gemzar, Infugem What should I tell my health care provider before I take this medicine? They need to know if you have any of these conditions:  blood disorders  infection  kidney disease  liver disease  lung or breathing disease, like asthma  recent or ongoing radiation therapy  an unusual or allergic reaction to gemcitabine, other chemotherapy, other medicines, foods, dyes, or preservatives  pregnant or trying to get pregnant  breast-feeding How should I use this medicine? This drug is given as an infusion into a vein. It is administered in a hospital or clinic by a specially trained health care professional. Talk to your pediatrician regarding the use of this medicine in children. Special care may be needed. Overdosage: If you think you have taken too much of this medicine contact a poison control center or emergency room at once. NOTE: This medicine is only for you. Do not share this medicine with others. What if I miss a dose? It is important not to miss your dose. Call your doctor or health care professional if you are unable to keep an appointment. What may interact with this medicine?  medicines to increase blood counts like filgrastim, pegfilgrastim, sargramostim  some other chemotherapy drugs like cisplatin  vaccines Talk to your doctor or health care professional before taking any of these medicines:  acetaminophen  aspirin  ibuprofen  ketoprofen  naproxen This list may not describe all possible interactions. Give your health care provider a list of all the medicines, herbs, non-prescription drugs, or  dietary supplements you use. Also tell them if you smoke, drink alcohol, or use illegal drugs. Some items may interact with your medicine. What should I watch for while using this medicine? Visit your doctor for checks on your progress. This drug may make you feel generally unwell. This is not uncommon, as chemotherapy can affect healthy cells as well as cancer cells. Report any side effects. Continue your course of treatment even though you feel ill unless your doctor tells you to stop. In some cases, you may be given additional medicines to help with side effects. Follow all directions for their use. Call your doctor or health care professional for advice if you get a fever, chills or sore throat, or other symptoms of a cold or flu. Do not treat yourself. This drug decreases your body's ability to fight infections. Try to avoid being around people who are sick. This medicine may increase your risk to bruise or bleed. Call your doctor or health care professional if you notice any unusual bleeding. Be careful brushing and flossing your teeth or using a toothpick because you may get an infection or bleed more easily. If you have any dental work done, tell your dentist you are receiving this medicine. Avoid taking products that contain aspirin, acetaminophen, ibuprofen, naproxen, or ketoprofen unless instructed by your doctor. These medicines may hide a fever. Do not become pregnant while taking this medicine or for 6 months after stopping it. Women should inform their doctor if they wish to become pregnant or think they might be pregnant. Men should not father a child while taking this medicine and for 3 months after stopping it.   There is a potential for serious side effects to an unborn child. Talk to your health care professional or pharmacist for more information. Do not breast-feed an infant while taking this medicine or for at least 1 week after stopping it. Men should inform their doctors if they wish  to father a child. This medicine may lower sperm counts. Talk with your doctor or health care professional if you are concerned about your fertility. What side effects may I notice from receiving this medicine? Side effects that you should report to your doctor or health care professional as soon as possible:  allergic reactions like skin rash, itching or hives, swelling of the face, lips, or tongue  breathing problems  pain, redness, or irritation at site where injected  signs and symptoms of a dangerous change in heartbeat or heart rhythm like chest pain; dizziness; fast or irregular heartbeat; palpitations; feeling faint or lightheaded, falls; breathing problems  signs of decreased platelets or bleeding - bruising, pinpoint red spots on the skin, black, tarry stools, blood in the urine  signs of decreased red blood cells - unusually weak or tired, feeling faint or lightheaded, falls  signs of infection - fever or chills, cough, sore throat, pain or difficulty passing urine  signs and symptoms of kidney injury like trouble passing urine or change in the amount of urine  signs and symptoms of liver injury like dark yellow or brown urine; general ill feeling or flu-like symptoms; light-colored stools; loss of appetite; nausea; right upper belly pain; unusually weak or tired; yellowing of the eyes or skin  swelling of ankles, feet, hands Side effects that usually do not require medical attention (report to your doctor or health care professional if they continue or are bothersome):  constipation  diarrhea  hair loss  loss of appetite  nausea  rash  vomiting This list may not describe all possible side effects. Call your doctor for medical advice about side effects. You may report side effects to FDA at 1-800-FDA-1088. Where should I keep my medicine? This drug is given in a hospital or clinic and will not be stored at home. NOTE: This sheet is a summary. It may not cover all  possible information. If you have questions about this medicine, talk to your doctor, pharmacist, or health care provider.  2020 Elsevier/Gold Standard (2017-08-29 18:06:11)  Oxaliplatin Injection What is this medicine? OXALIPLATIN (ox AL i PLA tin) is a chemotherapy drug. It targets fast dividing cells, like cancer cells, and causes these cells to die. This medicine is used to treat cancers of the colon and rectum, and many other cancers. This medicine may be used for other purposes; ask your health care provider or pharmacist if you have questions. COMMON BRAND NAME(S): Eloxatin What should I tell my health care provider before I take this medicine? They need to know if you have any of these conditions:  heart disease  history of irregular heartbeat  liver disease  low blood counts, like white cells, platelets, or red blood cells  lung or breathing disease, like asthma  take medicines that treat or prevent blood clots  tingling of the fingers or toes, or other nerve disorder  an unusual or allergic reaction to oxaliplatin, other chemotherapy, other medicines, foods, dyes, or preservatives  pregnant or trying to get pregnant  breast-feeding How should I use this medicine? This drug is given as an infusion into a vein. It is administered in a hospital or clinic by a specially trained health  care professional. Talk to your pediatrician regarding the use of this medicine in children. Special care may be needed. Overdosage: If you think you have taken too much of this medicine contact a poison control center or emergency room at once. NOTE: This medicine is only for you. Do not share this medicine with others. What if I miss a dose? It is important not to miss a dose. Call your doctor or health care professional if you are unable to keep an appointment. What may interact with this medicine? Do not take this medicine with any of the following  medications:  cisapride  dronedarone  pimozide  thioridazine This medicine may also interact with the following medications:  aspirin and aspirin-like medicines  certain medicines that treat or prevent blood clots like warfarin, apixaban, dabigatran, and rivaroxaban  cisplatin  cyclosporine  diuretics  medicines for infection like acyclovir, adefovir, amphotericin B, bacitracin, cidofovir, foscarnet, ganciclovir, gentamicin, pentamidine, vancomycin  NSAIDs, medicines for pain and inflammation, like ibuprofen or naproxen  other medicines that prolong the QT interval (an abnormal heart rhythm)  pamidronate  zoledronic acid This list may not describe all possible interactions. Give your health care provider a list of all the medicines, herbs, non-prescription drugs, or dietary supplements you use. Also tell them if you smoke, drink alcohol, or use illegal drugs. Some items may interact with your medicine. What should I watch for while using this medicine? Your condition will be monitored carefully while you are receiving this medicine. You may need blood work done while you are taking this medicine. This medicine may make you feel generally unwell. This is not uncommon as chemotherapy can affect healthy cells as well as cancer cells. Report any side effects. Continue your course of treatment even though you feel ill unless your healthcare professional tells you to stop. This medicine can make you more sensitive to cold. Do not drink cold drinks or use ice. Cover exposed skin before coming in contact with cold temperatures or cold objects. When out in cold weather wear warm clothing and cover your mouth and nose to warm the air that goes into your lungs. Tell your doctor if you get sensitive to the cold. Do not become pregnant while taking this medicine or for 9 months after stopping it. Women should inform their health care professional if they wish to become pregnant or think they  might be pregnant. Men should not father a child while taking this medicine and for 6 months after stopping it. There is potential for serious side effects to an unborn child. Talk to your health care professional for more information. Do not breast-feed a child while taking this medicine or for 3 months after stopping it. This medicine has caused ovarian failure in some women. This medicine may make it more difficult to get pregnant. Talk to your health care professional if you are concerned about your fertility. This medicine has caused decreased sperm counts in some men. This may make it more difficult to father a child. Talk to your health care professional if you are concerned about your fertility. This medicine may increase your risk of getting an infection. Call your health care professional for advice if you get a fever, chills, or sore throat, or other symptoms of a cold or flu. Do not treat yourself. Try to avoid being around people who are sick. Avoid taking medicines that contain aspirin, acetaminophen, ibuprofen, naproxen, or ketoprofen unless instructed by your health care professional. These medicines may hide a fever.  Be careful brushing or flossing your teeth or using a toothpick because you may get an infection or bleed more easily. If you have any dental work done, tell your dentist you are receiving this medicine. What side effects may I notice from receiving this medicine? Side effects that you should report to your doctor or health care professional as soon as possible:  allergic reactions like skin rash, itching or hives, swelling of the face, lips, or tongue  breathing problems  cough  low blood counts - this medicine may decrease the number of white blood cells, red blood cells, and platelets. You may be at increased risk for infections and bleeding  nausea, vomiting  pain, redness, or irritation at site where injected  pain, tingling, numbness in the hands or  feet  signs and symptoms of bleeding such as bloody or black, tarry stools; red or dark brown urine; spitting up blood or brown material that looks like coffee grounds; red spots on the skin; unusual bruising or bleeding from the eyes, gums, or nose  signs and symptoms of a dangerous change in heartbeat or heart rhythm like chest pain; dizziness; fast, irregular heartbeat; palpitations; feeling faint or lightheaded; falls  signs and symptoms of infection like fever; chills; cough; sore throat; pain or trouble passing urine  signs and symptoms of liver injury like dark yellow or brown urine; general ill feeling or flu-like symptoms; light-colored stools; loss of appetite; nausea; right upper belly pain; unusually weak or tired; yellowing of the eyes or skin  signs and symptoms of low red blood cells or anemia such as unusually weak or tired; feeling faint or lightheaded; falls  signs and symptoms of muscle injury like dark urine; trouble passing urine or change in the amount of urine; unusually weak or tired; muscle pain; back pain Side effects that usually do not require medical attention (report to your doctor or health care professional if they continue or are bothersome):  changes in taste  diarrhea  gas  hair loss  loss of appetite  mouth sores This list may not describe all possible side effects. Call your doctor for medical advice about side effects. You may report side effects to FDA at 1-800-FDA-1088. Where should I keep my medicine? This drug is given in a hospital or clinic and will not be stored at home. NOTE: This sheet is a summary. It may not cover all possible information. If you have questions about this medicine, talk to your doctor, pharmacist, or health care provider.  2020 Elsevier/Gold Standard (2018-10-23 12:20:35)

## 2020-06-09 ENCOUNTER — Inpatient Hospital Stay (HOSPITAL_BASED_OUTPATIENT_CLINIC_OR_DEPARTMENT_OTHER): Payer: Medicare HMO | Admitting: Hematology and Oncology

## 2020-06-09 ENCOUNTER — Encounter: Payer: Self-pay | Admitting: Hematology and Oncology

## 2020-06-09 ENCOUNTER — Telehealth: Payer: Self-pay | Admitting: Hematology and Oncology

## 2020-06-09 ENCOUNTER — Inpatient Hospital Stay: Payer: Medicare HMO

## 2020-06-09 ENCOUNTER — Other Ambulatory Visit: Payer: Self-pay

## 2020-06-09 VITALS — BP 114/50 | HR 55 | Temp 96.3°F | Resp 18 | Ht 65.0 in | Wt 152.4 lb

## 2020-06-09 DIAGNOSIS — Z5111 Encounter for antineoplastic chemotherapy: Secondary | ICD-10-CM | POA: Diagnosis not present

## 2020-06-09 DIAGNOSIS — N183 Chronic kidney disease, stage 3 unspecified: Secondary | ICD-10-CM

## 2020-06-09 DIAGNOSIS — C249 Malignant neoplasm of biliary tract, unspecified: Secondary | ICD-10-CM

## 2020-06-09 DIAGNOSIS — N521 Erectile dysfunction due to diseases classified elsewhere: Secondary | ICD-10-CM

## 2020-06-09 DIAGNOSIS — D490 Neoplasm of unspecified behavior of digestive system: Secondary | ICD-10-CM | POA: Diagnosis not present

## 2020-06-09 DIAGNOSIS — R748 Abnormal levels of other serum enzymes: Secondary | ICD-10-CM | POA: Diagnosis not present

## 2020-06-09 DIAGNOSIS — E1165 Type 2 diabetes mellitus with hyperglycemia: Secondary | ICD-10-CM

## 2020-06-09 DIAGNOSIS — IMO0002 Reserved for concepts with insufficient information to code with codable children: Secondary | ICD-10-CM | POA: Insufficient documentation

## 2020-06-09 DIAGNOSIS — D539 Nutritional anemia, unspecified: Secondary | ICD-10-CM

## 2020-06-09 LAB — COMPREHENSIVE METABOLIC PANEL
ALT: 67 U/L — ABNORMAL HIGH (ref 0–44)
AST: 42 U/L — ABNORMAL HIGH (ref 15–41)
Albumin: 2.5 g/dL — ABNORMAL LOW (ref 3.5–5.0)
Alkaline Phosphatase: 415 U/L — ABNORMAL HIGH (ref 38–126)
Anion gap: 11 (ref 5–15)
BUN: 29 mg/dL — ABNORMAL HIGH (ref 8–23)
CO2: 27 mmol/L (ref 22–32)
Calcium: 10.3 mg/dL (ref 8.9–10.3)
Chloride: 99 mmol/L (ref 98–111)
Creatinine, Ser: 1.19 mg/dL (ref 0.61–1.24)
GFR, Estimated: >60 mL/min (ref >60)
Glucose, Bld: 363 mg/dL — ABNORMAL HIGH (ref 70–99)
Potassium: 4.6 mmol/L (ref 3.5–5.1)
Sodium: 137 mmol/L (ref 135–145)
Total Bilirubin: 1.9 mg/dL — ABNORMAL HIGH (ref 0.3–1.2)
Total Protein: 7.7 g/dL (ref 6.5–8.1)

## 2020-06-09 LAB — CBC WITH DIFFERENTIAL/PLATELET
Abs Immature Granulocytes: 0.04 10*3/uL (ref 0.00–0.07)
Basophils Absolute: 0 10*3/uL (ref 0.0–0.1)
Basophils Relative: 0 %
Eosinophils Absolute: 0 10*3/uL (ref 0.0–0.5)
Eosinophils Relative: 0 %
HCT: 28.4 % — ABNORMAL LOW (ref 39.0–52.0)
Hemoglobin: 9 g/dL — ABNORMAL LOW (ref 13.0–17.0)
Immature Granulocytes: 0 %
Lymphocytes Relative: 9 %
Lymphs Abs: 0.8 10*3/uL (ref 0.7–4.0)
MCH: 29.6 pg (ref 26.0–34.0)
MCHC: 31.7 g/dL (ref 30.0–36.0)
MCV: 93.4 fL (ref 80.0–100.0)
Monocytes Absolute: 0.5 10*3/uL (ref 0.1–1.0)
Monocytes Relative: 6 %
Neutro Abs: 7.7 10*3/uL (ref 1.7–7.7)
Neutrophils Relative %: 85 %
Platelets: 514 10*3/uL — ABNORMAL HIGH (ref 150–400)
RBC: 3.04 MIL/uL — ABNORMAL LOW (ref 4.22–5.81)
RDW: 18 % — ABNORMAL HIGH (ref 11.5–15.5)
WBC: 9.1 10*3/uL (ref 4.0–10.5)
nRBC: 0 % (ref 0.0–0.2)

## 2020-06-09 LAB — SAMPLE TO BLOOD BANK

## 2020-06-09 MED ORDER — METFORMIN HCL 1000 MG PO TABS
1000.0000 mg | ORAL_TABLET | Freq: Two times a day (BID) | ORAL | 3 refills | Status: AC
Start: 2020-06-09 — End: ?

## 2020-06-09 MED ORDER — ALLOPURINOL 100 MG PO TABS: 150.0000 mg | ORAL_TABLET | Freq: Every day | ORAL | 3 refills | Status: DC

## 2020-06-09 NOTE — Assessment & Plan Note (Signed)
Clinically, he is responding very well to treatment His liver enzymes has improved Blood count is improving He has minimal drainage out of the percutaneous tube I will contact IR to schedule repeat cholangiogram and possible tube removal I will see him again next week for further follow-up and repeat blood work

## 2020-06-09 NOTE — Progress Notes (Signed)
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[facility]) Clinically, he is responding very well to treatment His liver enzymes has improved Blood count is improving He has minimal drainage out of the percutaneous tube I will contact IR to schedule repeat cholangiogram and possible tube removal I will see him again next week for further follow-up and repeat blood work  Hypercalcemia of malignancy His calcium level is slightly high Clinically, he appears to be improving I recommend increase oral fluid hydration He will complete dexamethasone taper this week I plan to recheck his calcium level again next week for further follow-up If his calcium level is elevated, I might have to repeat bisphosphonates  Elevated liver enzymes Overall, his liver enzymes are trending down Continue close observation  CKD (chronic kidney disease) stage 3, GFR 30-59 ml/min (HCC) His creatinine is elevated likely due to dehydration We discussed the importance of increase oral fluid intake  Uncontrolled diabetes mellitus ([place]) He has poorly controlled diabetes This is likely exacerbated by recent steroid treatment We discussed the importance of dietary modification and aggressive oral fluid intake Hopefully, with discontinuation of dexamethasone, his blood sugar control will improve in the near future   Orders Placed This Encounter  Procedures  . IR CHOLANGIOGRAM EXISTING TUBE    Standing Status:   Future    Standing Expiration Date:   06/09/2021    Order Specific Question:   Reason for Exam (SYMPTOM  OR DIAGNOSIS REQUIRED)    Answer:   D/W  IR, need repeat cholangiogram and possible tube removal    Order Specific  Question:   Preferred Imaging Location?    Answer:   [facility]  . IR PERCUTANEOUS TRANSHEPATIC CHOLANGIOGRAM    Standing Status:   Future    Standing Expiration Date:   06/09/2021    Order Specific Question:   Reason for Exam (SYMPTOM  OR DIAGNOSIS REQUIRED)    Answer:   D/W radiologist, for repeat cholangiogram and possible tube removal    Order Specific Question:   Preferred Imaging Location?    Answer:   [facility]  . IR Radiologist Eval & Mgmt    Standing Status:   Future    Standing Expiration Date:   06/09/2021    Order Specific Question:   Reason for Exam (SYMPTOM  OR DIAGNOSIS REQUIRED)    Answer:   for possible percutaneous drain removal    Order Specific Question:   Preferred Imaging Location?    Answer:   [facility]    All questions were answered. The patient knows to call the clinic with any problems, questions or concerns. The total time spent in the appointment was 30 minutes encounter with patients including review of chart and various tests results, discussions about plan of care and coordination of care plan   [name], MD 06/09/2020 3:09 PM  INTERVAL HISTORY: Please see below for problem oriented charting. He returns for further follow-up He tolerated recent chemotherapy well He has minimum pain He has minimal drainage out of the percutaneous tube Appetite is fair He has not checked his blood sugar lately Denies peripheral neuropathy from treatment  SUMMARY OF ONCOLOGIC HISTORY: Oncology History  Hepatobiliary cancer ([place])  05/20/2020 Imaging  MRI with MRCP 1. Masslike area in the central liver obstructing bile ducts, common hepatic duct and narrowing the portal vein extending from the gallbladder fossa a highly suspicious for aggressive biliary neoplasm/cholangiocarcinoma/gallbladder carcinoma. Superimposed infection is also possible. 2. Pancreas is largely normal but inseparable from enhancing soft tissue that extends from  the porta hepatis inferiorly and with irregular enhancement that surrounds an area of presumed necrotic tumor and or infected necrotic debris. 3. Separate smaller area showing heterogeneous enhancement likely reflects additional site of tumor in the liver in the inferior medial LEFT hepatic lobe. 4. Small volume ascites about the liver. 5. Biliary drainage tube appears to remain in place, not well evaluated except on MRCP sequences were a can be faintly seen   05/22/2020 Pathology Results   FINAL MICROSCOPIC DIAGNOSIS:   A. DUODENUM, BIOPSY:  - Duodenal mucosa with no significant pathologic findings.  - Negative for increased intraepithelial lymphocytes and villous  architectural changes.   B. STOMACH, BIOPSY:  - Gastric antral mucosa with mild reactive gastropathy.  - Gastric oxyntic mucosa with mild chronic gastritis.  - Warthin-Starry stain is negative for Helicobacter pylori.   C. BILIARY TISSUE, BIOPSY:  - Poorly differentiated carcinoma, favor adenocarcinoma.   ADDENDUM:   (C) Immunohistochemistry for MOC31 and CK7 is positive.  CK20, CDX-2, HEPAR-1, Glypican-3 and Arginase-1 are negative.  The immunophenotype favors pancreaticobiliary origin in this otherwise poorly differentiated carcinoma.      05/27/2020 Imaging   CT chest 1. Hypoenhancing and potentially centrally necrotic mass along the gallbladder fossa involving the right hepatic lobe and left hepatic lobe, with 2 biliary stents partially included on today's examination, and a small amount of pneumobilia. 2. No findings of metastatic disease to the chest. 3. Coronary, aortic arch, and branch vessel atherosclerotic vascular disease. 4. Paraseptal emphysema. 5. Mild bilateral gynecomastia. 6. Lipoma between the right pectoralis major and minor muscles. 7. Emphysema and aortic atherosclerosis.   05/27/2020 Imaging   CT abdomen and pelvis 1. Stable appearing ill-defined infiltrating neoplastic process in the central liver  inferiorly. This is most likely due to an infiltrating biliary neoplasm (cholangiocarcinoma). 2. Resolution of intrahepatic biliary dilatation due to the biliary stent. 3. Stable marked narrowing and irregularity of the middle and right portal veins with possible partial thrombosis. 4. Age advanced atherosclerotic calcifications involving the aorta and branch vessels. 5. Small left pleural effusion with overlying atelectasis. 6. Aortic atherosclerosis.   06/01/2020 Initial Diagnosis   Hepatobiliary cancer ([place])   06/01/2020 Cancer Staging   Staging form: Distal Bile Duct, AJCC 8th Edition - Clinical stage from 06/01/2020: Stage IIB (cT2, cN1, cM0) - Signed by [name], MD on 06/01/2020     REVIEW OF SYSTEMS:   Constitutional: Denies fevers, chills or abnormal weight loss Eyes: Denies blurriness of vision Ears, nose, mouth, throat, and face: Denies mucositis or sore throat Respiratory: Denies cough, dyspnea or wheezes Cardiovascular: Denies palpitation, chest discomfort or lower extremity swelling Gastrointestinal:  Denies nausea, heartburn or change in bowel habits Skin: Denies abnormal skin rashes Lymphatics: Denies new lymphadenopathy or easy bruising Neurological:Denies numbness, tingling or new weaknesses Behavioral/Psych: Mood is stable, no new changes  All other systems were reviewed with the patient and are negative.  I have reviewed the past medical history, past surgical history, social history and family history with the patient and they are unchanged from previous note.  ALLERGIES:  has No Known Allergies.  MEDICATIONS:  Current Outpatient Medications  Medication Sig Dispense Refill  . Accu-Chek Softclix Lancets  lancets Check blood sugars 2 times a day while on steroids 100 each 2  . allopurinol (ZYLOPRIM) 100 MG tablet TAKE 1 AND 1/2 TABLETS(150 MG) BY MOUTH DAILY (Patient taking differently: Take 150 mg by mouth daily. ) 135 tablet 1  . amLODipine (NORVASC) 10 MG  tablet Take 1 tablet (10 mg total) by mouth daily. 90 tablet 3  . aspirin EC 81 MG tablet Take 1 tablet (81 mg total) by mouth daily. 90 tablet 3  . BIKTARVY 50-200-25 MG TABS tablet TAKE 1 TABLET BY MOUTH DAILY (Patient taking differently: Take 1 tablet by mouth daily. ) 90 tablet 1  . blood glucose meter kit and supplies KIT Dispense based on patient and insurance preference. Use up to four times daily as directed. (FOR ICD-9 250.00, 250.01). 1 each 0  . Blood Glucose Monitoring Suppl (ACCU-CHEK GUIDE) w/Device KIT Check blood sugar 2 times a day while on steroids 1 kit 1  . cholecalciferol (VITAMIN D) 25 MCG tablet Take 1 tablet (1,000 Units total) by mouth daily. 60 tablet 0  . Continuous Blood Gluc Receiver (FREESTYLE LIBRE 2 READER SYSTM) DEVI 1 Device by Does not apply route 3 (three) times daily. 1 Device 0  . Continuous Blood Gluc Sensor (FREESTYLE LIBRE 2 SENSOR SYSTM) MISC 1 Device by Does not apply route 3 (three) times daily. 1 each 0  . dexamethasone (DECADRON) 4 MG tablet Take 1 tablet (4 mg total) by mouth 2 (two) times daily for 14 days. 28 tablet 0  . glucose blood (ACCU-CHEK GUIDE) test strip Check blood sugar 2 times per day while on steroids 100 each 2  . losartan (COZAAR) 50 MG tablet Take 1 tablet (50 mg total) by mouth daily. 90 tablet 3  . metFORMIN (GLUCOPHAGE) 1000 MG tablet TAKE 1 TABLET BY MOUTH TWICE DAILY WITH A MEAL (Patient taking differently: Take 1,000 mg by mouth 2 (two) times daily with a meal. ) 180 tablet 1  . metoprolol succinate (TOPROL-XL) 50 MG 24 hr tablet TAKE 1 TABLET BY MOUTH DAILY WITH OR IMMEDAITELY FOLLOWING A MEAL 30 tablet 3  . Multiple Vitamin (MULTIVITAMIN WITH MINERALS) TABS tablet Take 1 tablet by mouth daily.    . nitroGLYCERIN (NITROSTAT) 0.4 MG SL tablet Place 1 tablet (0.4 mg total) under the tongue every 5 (five) minutes as needed for chest pain. 25 tablet 3  . ondansetron (ZOFRAN) 8 MG tablet Take 1 tablet (8 mg total) by mouth every 8  (eight) hours as needed for refractory nausea / vomiting. Start on day 3 after chemotherapy. 30 tablet 1  . ONETOUCH DELICA LANCETS FINE MISC Check blood sugar up to 1 time a day 100 each 12  . prochlorperazine (COMPAZINE) 10 MG tablet Take 1 tablet (10 mg total) by mouth every 6 (six) hours as needed (Nausea or vomiting). 30 tablet 1   No current facility-administered medications for this visit.    PHYSICAL EXAMINATION: ECOG PERFORMANCE STATUS: 1 - Symptomatic but completely ambulatory  Vitals:   06/09/20 1054  BP: (!) 114/50  Pulse: (!) 55  Resp: 18  Temp: (!) 96.3 F (35.7 C)  SpO2: 100%   Filed Weights   06/09/20 1054  Weight: 152 lb 6.4 oz (69.1 kg)    GENERAL:alert, no distress and comfortable SKIN: skin color, texture, turgor are normal, no rashes or significant lesions EYES: normal, Conjunctiva are pink and non-injected, sclera clear OROPHARYNX:no exudate, no erythema and lips, buccal mucosa, and tongue normal  NECK: supple, thyroid normal size,  non-tender, without nodularity LYMPH:  no palpable lymphadenopathy in the cervical, axillary or inguinal LUNGS: clear to auscultation and percussion with normal breathing effort HEART: regular rate & rhythm and no murmurs and no lower extremity edema ABDOMEN:abdomen soft, non-tender and normal bowel sounds Musculoskeletal:no cyanosis of digits and no clubbing  NEURO: alert & oriented x 3 with fluent speech, no focal motor/sensory deficits  LABORATORY DATA:  I have reviewed the data as listed    Component Value Date/Time   NA 137 06/09/2020 1029   NA 138 03/30/2020 0938   K 4.6 06/09/2020 1029   CL 99 06/09/2020 1029   CO2 27 06/09/2020 1029   GLUCOSE 363 (H) 06/09/2020 1029   BUN 29 (H) 06/09/2020 1029   BUN 20 03/30/2020 0938   CREATININE 1.19 06/09/2020 1029   CREATININE 1.24 03/25/2020 0928   CALCIUM 10.3 06/09/2020 1029   PROT 7.7 06/09/2020 1029   PROT 7.4 03/30/2020 0938   ALBUMIN 2.5 (L) 06/09/2020 1029    ALBUMIN 4.2 03/30/2020 0938   AST 42 (H) 06/09/2020 1029   ALT 67 (H) 06/09/2020 1029   ALKPHOS 415 (H) 06/09/2020 1029   BILITOT 1.9 (H) 06/09/2020 1029   BILITOT 0.2 03/30/2020 0938   GFRNONAA >60 06/09/2020 1029   GFRNONAA 62 03/25/2020 0928   GFRAA 83 03/30/2020 0938   GFRAA 72 03/25/2020 0928    No results found for: SPEP, UPEP  Lab Results  Component Value Date   WBC 9.1 06/09/2020   NEUTROABS 7.7 06/09/2020   HGB 9.0 (L) 06/09/2020   HCT 28.4 (L) 06/09/2020   MCV 93.4 06/09/2020   PLT 514 (H) 06/09/2020      Chemistry      Component Value Date/Time   NA 137 06/09/2020 1029   NA 138 03/30/2020 0938   K 4.6 06/09/2020 1029   CL 99 06/09/2020 1029   CO2 27 06/09/2020 1029   BUN 29 (H) 06/09/2020 1029   BUN 20 03/30/2020 0938   CREATININE 1.19 06/09/2020 1029   CREATININE 1.24 03/25/2020 0928      Component Value Date/Time   CALCIUM 10.3 06/09/2020 1029   ALKPHOS 415 (H) 06/09/2020 1029   AST 42 (H) 06/09/2020 1029   ALT 67 (H) 06/09/2020 1029   BILITOT 1.9 (H) 06/09/2020 1029   BILITOT 0.2 03/30/2020 0938       RADIOGRAPHIC STUDIES: I have personally reviewed the radiological images as listed and agreed with the findings in the report. CT ABDOMEN PELVIS W WO CONTRAST  Result Date: 05/27/2020 CLINICAL DATA:  Cancer of unknown primary. Staging liver disease. Poorly differentiated carcinoma. EXAM: CT ABDOMEN AND PELVIS WITHOUT AND WITH CONTRAST TECHNIQUE: Multidetector CT imaging of the abdomen and pelvis was performed following the standard protocol before and following the bolus administration of intravenous contrast. CONTRAST:  171m OMNIPAQUE IOHEXOL 300 MG/ML  SOLN COMPARISON:  03/05/2020 CT scan and MRI 05/20/2020. FINDINGS: Lower chest: The heart is within normal limits in size. No pericardial effusion. Age advanced aortic and coronary artery calcifications are noted. Small left pleural effusion overlying atelectasis. Minimal focus of right basilar  atelectasis. Hepatobiliary: As demonstrated on the MRI examination there is an ill-defined infiltrating neoplastic process in the central liver inferiorly. Heterogeneous peripheral enhancement mainly on the delayed images. Findings most likely due to an infiltrating biliary neoplasm (cholangiocarcinoma). The intrahepatic biliary dilatation has resolved due to the biliary stent. There is also a catheter in the gallbladder which is decompressed. Some gas is noted in the biliary  tree which is not unexpected. Stable marked narrowing and irregularity of the middle and right portal veins with possible partial thrombosis. The left portal vein is patent. Pancreas: No mass, inflammation or ductal dilatation. Spleen: Normal size. No focal lesions. Adrenals/Urinary Tract: Adrenal glands and kidneys are unremarkable. No worrisome renal lesions or hydronephrosis. Bladder is normal. Stomach/Bowel: The stomach, duodenum, small bowel and colon are unremarkable. No acute inflammatory process, mass lesions or obstructive findings. The terminal ileum and appendix are normal. Vascular/Lymphatic: Age advanced atherosclerotic calcifications involving the aorta and branch vessels but no aneurysm or dissection. The branch vessels are patent. The major venous structures are patent. No abdominal lymphadenopathy, omental disease or peritoneal implants. Reproductive: The prostate gland and seminal vesicles are unremarkable. Other: No pelvic mass or adenopathy. No free pelvic fluid collections. No inguinal adenopathy. Musculoskeletal: No significant bony findings. IMPRESSION: 1. Stable appearing ill-defined infiltrating neoplastic process in the central liver inferiorly. This is most likely due to an infiltrating biliary neoplasm (cholangiocarcinoma). 2. Resolution of intrahepatic biliary dilatation due to the biliary stent. 3. Stable marked narrowing and irregularity of the middle and right portal veins with possible partial thrombosis. 4. Age  advanced atherosclerotic calcifications involving the aorta and branch vessels. 5. Small left pleural effusion with overlying atelectasis. 6. Aortic atherosclerosis. Aortic Atherosclerosis (ICD10-I70.0). Electronically Signed   By: Marijo Sanes M.D.   On: 05/27/2020 05:22   CT CHEST W CONTRAST  Result Date: 05/27/2020 CLINICAL DATA:  Cancer of unknown primary.  Liver mass. EXAM: CT CHEST WITH CONTRAST TECHNIQUE: Multidetector CT imaging of the chest was performed during intravenous contrast administration. CONTRAST:  63m OMNIPAQUE IOHEXOL 300 MG/ML  SOLN COMPARISON:  CT abdomen/pelvis from 05/27/2020 at 12:57 a.m. (Approximately 12 hours prior to this current examination) FINDINGS: Cardiovascular: Coronary, aortic arch, and branch vessel atherosclerotic vascular disease. Mediastinum/Nodes: No pathologic adenopathy observed. Mild bilateral gynecomastia. Lungs/Pleura: Paraseptal emphysema best. Appreciated along the apices 2 mm nodule along the confluence of the major and minor fissure on image 71 series 7, probably a benign subpleural lymph node. No findings of lung mass or specifically worrisome lung nodule. Mild scarring or subsegmental atelectasis anteriorly in the left lower lobe for example on image 93 of series 7. Upper Abdomen: Hypoenhancing and potentially centrally necrotic mass along the gallbladder fossa involving the right hepatic lobe and left hepatic lobe, with 2 biliary stents partially included on today's examination, and a small amount of pneumobilia. Pigtail catheter below the liver partially seen on today's examination. Abdominal aortic atherosclerotic calcification. Musculoskeletal: Lipoma between the right pectoralis major and minor muscles. IMPRESSION: 1. Hypoenhancing and potentially centrally necrotic mass along the gallbladder fossa involving the right hepatic lobe and left hepatic lobe, with 2 biliary stents partially included on today's examination, and a small amount of pneumobilia.  2. No findings of metastatic disease to the chest. 3. Coronary, aortic arch, and branch vessel atherosclerotic vascular disease. 4. Paraseptal emphysema. 5. Mild bilateral gynecomastia. 6. Lipoma between the right pectoralis major and minor muscles. 7. Emphysema and aortic atherosclerosis. Aortic Atherosclerosis (ICD10-I70.0) and Emphysema (ICD10-J43.9). Electronically Signed   By: WVan ClinesM.D.   On: 05/27/2020 15:07   DG ERCP BILIARY & PANCREATIC DUCTS  Result Date: 05/22/2020 CLINICAL DATA:  ERCP. EXAM: ERCP TECHNIQUE: Multiple spot images obtained with the fluoroscopic device and submitted for interpretation post-procedure. FLUOROSCOPY TIME:  Fluoroscopy Time:  8 minutes and 21 seconds. Number of Acquired Spot Images: 23 COMPARISON:  MRI dated 05/20/2020 FINDINGS: Cannulation of the common bile duct  with injection of contrast demonstrates intrahepatic biliary ductal dilatation. Subsequent images demonstrate placement 2 plastic biliary stents. IMPRESSION: ERCP with stent placement as detailed above. These images were submitted for radiologic interpretation only. Please see the procedural report for the amount of contrast and the fluoroscopy time utilized. Electronically Signed   By: Constance Holster M.D.   On: 05/22/2020 18:40   MR ABDOMEN MRCP W WO CONTAST  Result Date: 05/20/2020 CLINICAL DATA:  Painless jaundice in a 59 year old male EXAM: MRI ABDOMEN WITHOUT AND WITH CONTRAST (INCLUDING MRCP) TECHNIQUE: Multiplanar multisequence MR imaging of the abdomen was performed both before and after the administration of intravenous contrast. Heavily T2-weighted images of the biliary and pancreatic ducts were obtained, and three-dimensional MRCP images were rendered by post processing. CONTRAST:  6.47m GADAVIST GADOBUTROL 1 MMOL/ML IV SOLN COMPARISON:  CT abdomen and pelvis from September of 2021 FINDINGS: Lower chest: Lung bases are clear, no consolidation or sign of pleural effusion. Limited  assessment of lung bases on MRI. Hepatobiliary: Masslike appearance of the liver and a portion of the gallbladder. Obstruction of the biliary tree at the porta hepatis. Is soft tissue with enhancement extends to the porta hepatis obstructing the biliary tree. And irregular masslike area with irregular peripheral enhancement extends from the gallbladder fossa into the porta hepatis where the bile duct shows transition. Biliary drainage catheter remains in place. This area measures 7.9 x 6.7 cm in greatest axial dimension with another area in hepatic subsegment IV that measures 2.9 x 2.3 cm. Enhancement along the margin posteriorly with soft tissue measuring 3.5 x 2.3 cm on image 46 of series 30. Profound biliary duct distension. T1 signal with mild mixed signal in the area and nonenhancing central portion of this large area extending into the central liver. Portal vein is narrowed at the biliary confluence. This is near the site of biliary duct transition no additional hepatic lesion. FPilar Plateenhancing soft tissue just above the pancreatic head is seen on image 48 of series 32 Pancreas:  No sign of pancreatic inflammation or ductal dilation. Spleen:  Normal Adrenals/Urinary Tract: Adrenal glands are normal. Kidneys with smooth contours. No hydronephrosis. Stomach/Bowel: Gastrointestinal tract without acute process. No acute gastrointestinal process to the extent evaluated. Limited assessment on MRI. Vascular/Lymphatic: Normal caliber abdominal aorta. There is no gastrohepatic or hepatoduodenal ligament lymphadenopathy. No retroperitoneal or mesenteric lymphadenopathy. Other:  Small volume ascites about the liver. Musculoskeletal: No acute musculoskeletal process. No destructive bone finding on limited assessment. IMPRESSION: 1. Masslike area in the central liver obstructing bile ducts, common hepatic duct and narrowing the portal vein extending from the gallbladder fossa a highly suspicious for aggressive biliary  neoplasm/cholangiocarcinoma/gallbladder carcinoma. Superimposed infection is also possible. 2. Pancreas is largely normal but inseparable from enhancing soft tissue that extends from the porta hepatis inferiorly and with irregular enhancement that surrounds an area of presumed necrotic tumor and or infected necrotic debris. 3. Separate smaller area showing heterogeneous enhancement likely reflects additional site of tumor in the liver in the inferior medial LEFT hepatic lobe. 4. Small volume ascites about the liver. 5. Biliary drainage tube appears to remain in place, not well evaluated except on MRCP sequences were a can be faintly seen. Electronically Signed   By: GZetta BillsM.D.   On: 05/20/2020 17:55

## 2020-06-09 NOTE — Telephone Encounter (Signed)
Scheduled appts per 12/22 sch msg. Gave pt a print out of AVS.  

## 2020-06-09 NOTE — Assessment & Plan Note (Signed)
His calcium level is slightly high Clinically, he appears to be improving I recommend increase oral fluid hydration He will complete dexamethasone taper this week I plan to recheck his calcium level again next week for further follow-up If his calcium level is elevated, I might have to repeat bisphosphonates

## 2020-06-09 NOTE — Assessment & Plan Note (Signed)
Overall, his liver enzymes are trending down Continue close observation

## 2020-06-09 NOTE — Assessment & Plan Note (Signed)
He has poorly controlled diabetes This is likely exacerbated by recent steroid treatment We discussed the importance of dietary modification and aggressive oral fluid intake Hopefully, with discontinuation of dexamethasone, his blood sugar control will improve in the near future

## 2020-06-09 NOTE — Assessment & Plan Note (Signed)
His creatinine is elevated likely due to dehydration We discussed the importance of increase oral fluid intake

## 2020-06-10 ENCOUNTER — Other Ambulatory Visit: Payer: Self-pay | Admitting: Hematology and Oncology

## 2020-06-10 ENCOUNTER — Other Ambulatory Visit (HOSPITAL_COMMUNITY): Payer: Self-pay | Admitting: Interventional Radiology

## 2020-06-10 ENCOUNTER — Ambulatory Visit (HOSPITAL_COMMUNITY)
Admission: RE | Admit: 2020-06-10 | Discharge: 2020-06-10 | Disposition: A | Discharge status: Home / Self Care | Payer: Medicare HMO | Source: Ambulatory Visit | Attending: Hematology and Oncology | Admitting: Hematology and Oncology

## 2020-06-10 DIAGNOSIS — K820 Obstruction of gallbladder: Secondary | ICD-10-CM | POA: Diagnosis not present

## 2020-06-10 DIAGNOSIS — D490 Neoplasm of unspecified behavior of digestive system: Secondary | ICD-10-CM

## 2020-06-10 DIAGNOSIS — X58XXXA Exposure to other specified factors, initial encounter: Secondary | ICD-10-CM | POA: Insufficient documentation

## 2020-06-10 DIAGNOSIS — C249 Malignant neoplasm of biliary tract, unspecified: Secondary | ICD-10-CM

## 2020-06-10 DIAGNOSIS — T85590A Other mechanical complication of bile duct prosthesis, initial encounter: Secondary | ICD-10-CM | POA: Diagnosis not present

## 2020-06-10 HISTORY — PX: IR EXCHANGE BILIARY DRAIN: IMG6046

## 2020-06-10 LAB — CANCER ANTIGEN 19-9: CA 19-9: 100 U/mL — ABNORMAL HIGH (ref 0–35)

## 2020-06-10 MED ORDER — LIDOCAINE HCL 1 % IJ SOLN
INTRAMUSCULAR | Status: DC | PRN
  Administered 2020-06-10: 5 mL

## 2020-06-10 MED ORDER — IOHEXOL 300 MG/ML  SOLN
50.0000 mL | Freq: Once | INTRAMUSCULAR | Status: AC | PRN
  Administered 2020-06-10: 15:00:00 10 mL

## 2020-06-10 MED ORDER — LIDOCAINE HCL 1 % IJ SOLN
INTRAMUSCULAR | Status: AC
  Filled 2020-06-10: qty 20

## 2020-06-10 NOTE — Procedures (Signed)
Interventional Radiology Procedure Note  Procedure: cholecystostomy injection and exchg    Complications: None  Estimated Blood Loss:  min  Findings: Cystic duct obstructed  Tube exchg'd  Keep to gravity bag    M. Daryll Brod, MD

## 2020-06-14 ENCOUNTER — Inpatient Hospital Stay: Payer: Medicare HMO

## 2020-06-14 ENCOUNTER — Emergency Department (HOSPITAL_COMMUNITY)
Admission: EM | Admit: 2020-06-14 | Discharge: 2020-06-14 | Disposition: A | Discharge status: Home / Self Care | Payer: Medicare HMO | Source: Home / Self Care | Attending: Emergency Medicine | Admitting: Emergency Medicine

## 2020-06-14 ENCOUNTER — Encounter: Payer: Self-pay | Admitting: Hematology and Oncology

## 2020-06-14 ENCOUNTER — Encounter (HOSPITAL_COMMUNITY): Payer: Self-pay

## 2020-06-14 ENCOUNTER — Other Ambulatory Visit: Payer: Self-pay

## 2020-06-14 ENCOUNTER — Inpatient Hospital Stay (HOSPITAL_BASED_OUTPATIENT_CLINIC_OR_DEPARTMENT_OTHER): Payer: Medicare HMO | Admitting: Hematology and Oncology

## 2020-06-14 DIAGNOSIS — C249 Malignant neoplasm of biliary tract, unspecified: Secondary | ICD-10-CM | POA: Diagnosis not present

## 2020-06-14 DIAGNOSIS — R739 Hyperglycemia, unspecified: Secondary | ICD-10-CM

## 2020-06-14 DIAGNOSIS — A419 Sepsis, unspecified organism: Secondary | ICD-10-CM | POA: Diagnosis not present

## 2020-06-14 DIAGNOSIS — I129 Hypertensive chronic kidney disease with stage 1 through stage 4 chronic kidney disease, or unspecified chronic kidney disease: Secondary | ICD-10-CM | POA: Insufficient documentation

## 2020-06-14 DIAGNOSIS — E1165 Type 2 diabetes mellitus with hyperglycemia: Secondary | ICD-10-CM | POA: Insufficient documentation

## 2020-06-14 DIAGNOSIS — N183 Chronic kidney disease, stage 3 unspecified: Secondary | ICD-10-CM

## 2020-06-14 DIAGNOSIS — I469 Cardiac arrest, cause unspecified: Secondary | ICD-10-CM | POA: Diagnosis not present

## 2020-06-14 DIAGNOSIS — I25110 Atherosclerotic heart disease of native coronary artery with unstable angina pectoris: Secondary | ICD-10-CM | POA: Insufficient documentation

## 2020-06-14 DIAGNOSIS — Z21 Asymptomatic human immunodeficiency virus [HIV] infection status: Secondary | ICD-10-CM | POA: Insufficient documentation

## 2020-06-14 DIAGNOSIS — Z955 Presence of coronary angioplasty implant and graft: Secondary | ICD-10-CM | POA: Insufficient documentation

## 2020-06-14 DIAGNOSIS — Z79899 Other long term (current) drug therapy: Secondary | ICD-10-CM | POA: Insufficient documentation

## 2020-06-14 DIAGNOSIS — D539 Nutritional anemia, unspecified: Secondary | ICD-10-CM

## 2020-06-14 DIAGNOSIS — Z87891 Personal history of nicotine dependence: Secondary | ICD-10-CM | POA: Insufficient documentation

## 2020-06-14 DIAGNOSIS — Z7982 Long term (current) use of aspirin: Secondary | ICD-10-CM | POA: Insufficient documentation

## 2020-06-14 DIAGNOSIS — Z7984 Long term (current) use of oral hypoglycemic drugs: Secondary | ICD-10-CM | POA: Insufficient documentation

## 2020-06-14 DIAGNOSIS — R748 Abnormal levels of other serum enzymes: Secondary | ICD-10-CM

## 2020-06-14 LAB — URINALYSIS, ROUTINE W REFLEX MICROSCOPIC
Bacteria, UA: NONE SEEN
Bilirubin Urine: NEGATIVE
Glucose, UA: >=500 mg/dL — AB
Hgb urine dipstick: NEGATIVE
Ketones, ur: NEGATIVE mg/dL
Leukocytes,Ua: NEGATIVE
Nitrite: NEGATIVE
Protein, ur: NEGATIVE mg/dL
Specific Gravity, Urine: 1.026 (ref 1.005–1.030)
pH: 5 (ref 5.0–8.0)

## 2020-06-14 LAB — BASIC METABOLIC PANEL
Anion gap: 11 (ref 5–15)
BUN: 36 mg/dL — ABNORMAL HIGH (ref 8–23)
CO2: 26 mmol/L (ref 22–32)
Calcium: 9.6 mg/dL (ref 8.9–10.3)
Chloride: 95 mmol/L — ABNORMAL LOW (ref 98–111)
Creatinine, Ser: 1.21 mg/dL (ref 0.61–1.24)
GFR, Estimated: >60 mL/min (ref >60)
Glucose, Bld: 547 mg/dL (ref 70–99)
Potassium: 4.5 mmol/L (ref 3.5–5.1)
Sodium: 132 mmol/L — ABNORMAL LOW (ref 135–145)

## 2020-06-14 LAB — CBC WITH DIFFERENTIAL/PLATELET
Abs Immature Granulocytes: 0.1 10*3/uL — ABNORMAL HIGH (ref 0.00–0.07)
Basophils Absolute: 0 10*3/uL (ref 0.0–0.1)
Basophils Relative: 0 %
Eosinophils Absolute: 0 10*3/uL (ref 0.0–0.5)
Eosinophils Relative: 0 %
HCT: 28.6 % — ABNORMAL LOW (ref 39.0–52.0)
Hemoglobin: 9.3 g/dL — ABNORMAL LOW (ref 13.0–17.0)
Immature Granulocytes: 1 %
Lymphocytes Relative: 6 %
Lymphs Abs: 0.7 10*3/uL (ref 0.7–4.0)
MCH: 29.5 pg (ref 26.0–34.0)
MCHC: 32.5 g/dL (ref 30.0–36.0)
MCV: 90.8 fL (ref 80.0–100.0)
Monocytes Absolute: 0.5 10*3/uL (ref 0.1–1.0)
Monocytes Relative: 5 %
Neutro Abs: 9.7 10*3/uL — ABNORMAL HIGH (ref 1.7–7.7)
Neutrophils Relative %: 88 %
Platelets: 264 10*3/uL (ref 150–400)
RBC: 3.15 MIL/uL — ABNORMAL LOW (ref 4.22–5.81)
RDW: 17.3 % — ABNORMAL HIGH (ref 11.5–15.5)
WBC: 11 10*3/uL — ABNORMAL HIGH (ref 4.0–10.5)
nRBC: 0 % (ref 0.0–0.2)

## 2020-06-14 LAB — COMPREHENSIVE METABOLIC PANEL
ALT: 65 U/L — ABNORMAL HIGH (ref 0–44)
AST: 40 U/L (ref 15–41)
Albumin: 2.4 g/dL — ABNORMAL LOW (ref 3.5–5.0)
Alkaline Phosphatase: 433 U/L — ABNORMAL HIGH (ref 38–126)
Anion gap: 11 (ref 5–15)
BUN: 37 mg/dL — ABNORMAL HIGH (ref 8–23)
CO2: 25 mmol/L (ref 22–32)
Calcium: 10.5 mg/dL — ABNORMAL HIGH (ref 8.9–10.3)
Chloride: 95 mmol/L — ABNORMAL LOW (ref 98–111)
Creatinine, Ser: 1.37 mg/dL — ABNORMAL HIGH (ref 0.61–1.24)
GFR, Estimated: 59 mL/min — ABNORMAL LOW (ref >60)
Glucose, Bld: 522 mg/dL (ref 70–99)
Potassium: 5 mmol/L (ref 3.5–5.1)
Sodium: 131 mmol/L — ABNORMAL LOW (ref 135–145)
Total Bilirubin: 1.4 mg/dL — ABNORMAL HIGH (ref 0.3–1.2)
Total Protein: 7.3 g/dL (ref 6.5–8.1)

## 2020-06-14 LAB — CBG MONITORING, ED
Glucose-Capillary: 492 mg/dL — ABNORMAL HIGH (ref 70–99)
Glucose-Capillary: >600 mg/dL (ref 70–99)
Glucose-Capillary: 588 mg/dL (ref 70–99)
Glucose-Capillary: 507 mg/dL (ref 70–99)
Glucose-Capillary: 397 mg/dL — ABNORMAL HIGH (ref 70–99)

## 2020-06-14 LAB — CBC
HCT: 27.8 % — ABNORMAL LOW (ref 39.0–52.0)
Hemoglobin: 9.2 g/dL — ABNORMAL LOW (ref 13.0–17.0)
MCH: 30.4 pg (ref 26.0–34.0)
MCHC: 33.1 g/dL (ref 30.0–36.0)
MCV: 91.7 fL (ref 80.0–100.0)
Platelets: 269 10*3/uL (ref 150–400)
RBC: 3.03 MIL/uL — ABNORMAL LOW (ref 4.22–5.81)
RDW: 17.5 % — ABNORMAL HIGH (ref 11.5–15.5)
WBC: 12.2 10*3/uL — ABNORMAL HIGH (ref 4.0–10.5)
nRBC: 0.2 % (ref 0.0–0.2)

## 2020-06-14 LAB — SAMPLE TO BLOOD BANK

## 2020-06-14 MED ORDER — INSULIN ASPART 100 UNIT/ML ~~LOC~~ SOLN
7.0000 [IU] | Freq: Once | SUBCUTANEOUS | Status: AC
  Administered 2020-06-14: 13:00:00 7 [IU] via SUBCUTANEOUS

## 2020-06-14 MED ORDER — INSULIN ASPART 100 UNIT/ML ~~LOC~~ SOLN
12.0000 [IU] | Freq: Once | SUBCUTANEOUS | Status: AC
  Administered 2020-06-14: 15:00:00 12 [IU] via SUBCUTANEOUS

## 2020-06-14 NOTE — ED Provider Notes (Signed)
[facility] EMERGENCY DEPARTMENT Provider Note   CSN: [id number] Arrival date & time: 06/14/20  1055     History Chief Complaint  Patient presents with  . Hyperglycemia    John Dickerson is a 59 y.o. male.  HPI   59 year old male with past medical history of HIV, DM type II, HTN, HLD, hepatobiliary cancer who is currently following with oncology presents the emergency department from the oncologist office for concern of hyperglycemia.  Patient is scheduled for port placement to begin chemotherapy next week.  He was just recently on a course of dexamethasone that he completed couple days ago.  Patient states he has been slightly thirsty but otherwise denies any acute complaints including fever, headache, chest pain, abdominal pain, vomiting/diarrhea.  He states he is otherwise been compliant in taking his medications as directed.  Past Medical History:  Diagnosis Date  . Anemia   . Anemia due to medication 09/24/2015  . Arthritis    "feet" (11/19/2015)  . CAD (coronary artery disease), native coronary artery    a. remote LAD stent 1990s. b. s/p DESx2 to LAD 11/2015  . Hepatitis A immune 11/20/2016  . Hepatitis B immune 11/20/2016  . Hepatitis C    "going thru the treatments" (11/19/2015)  . High cholesterol   . History of gout    "just recently" (11/19/2015)  . HIV (human immunodeficiency virus infection) ([place])   . Hypertension   . Posterior pain of left hip 03/01/2018  . Type II diabetes mellitus ([place])    diagnosed 04-2015    Patient Active Problem List   Diagnosis Date Noted  . Uncontrolled diabetes mellitus ([place]) 06/09/2020  . Normocytic anemia 06/01/2020  . Hepatobiliary cancer ([place]) 06/01/2020  . Goals of care, counseling/discussion 06/01/2020  . Aortic atherosclerosis ([place]) 05/27/2020  . Biliary tract neoplasm 05/27/2020  . Elevated liver enzymes   . Elevated alkaline phosphatase level   . Loss of weight   . Protein-calorie malnutrition, severe  05/20/2020  . Painless jaundice 05/19/2020  . Hypercalcemia of malignancy 03/30/2020  . Pre-syncope 03/19/2020  . CKD (chronic kidney disease) stage 3, GFR 30-59 ml/min (HCC) 03/16/2020  . Acalculous cholecystitis 03/03/2020  . Gluteal pain (left) 04/16/2019  . Contact dermatitis 01/08/2019  . Rash 10/29/2018  . Chronic hepatitis C without hepatic coma ([place]) 08/21/2018  . Cataracts, bilateral 05/21/2017  . S/P angioplasty with stent, 11/19/15 DES X 2 mLAD and dLAD, normal EF  11/20/2015  . Coronary artery disease involving native coronary artery of native heart   . Exertional angina (HCC) 11/17/2015  . Gout 10/29/2015  . Type 2 diabetes mellitus with circulatory disorder causing erectile dysfunction ([place]) 05/11/2015  . Preventative health care 05/11/2015  . Hyperlipemia 05/11/2015  . Arthritis of shoulder region, left 03/24/2013  . Hypertriglyceridemia 03/24/2013  . ERECTILE DYSFUNCTION 09/12/2006  . Human immunodeficiency virus (HIV) disease ([place]) 08/09/2006  . Essential hypertension 08/09/2006    Past Surgical History:  Procedure Laterality Date  . BILIARY BRUSHING  05/22/2020   Procedure: BILIARY BRUSHING;  Surgeon: Rush Landmark Telford Nab., MD;  Location: [facility];  Service: Gastroenterology;;  . BILIARY DILATION  05/22/2020   Procedure: BILIARY DILATION;  Surgeon: Irving Copas., MD;  Location: [facility];  Service: Gastroenterology;;  . BILIARY STENT PLACEMENT  05/22/2020   Procedure: BILIARY STENT PLACEMENT;  Surgeon: Irving Copas., MD;  Location: [facility];  Service: Gastroenterology;;  . BIOPSY  05/22/2020   Procedure: BIOPSY;  Surgeon: Irving Copas., MD;  Location: [facility];  Service: Gastroenterology;;  . CARDIAC CATHETERIZATION N/A 11/19/2015   Procedure: Left Heart Cath and Coronary Angiography;  Surgeon: Jolaine Artist, MD;  Location: [facility];  Service: Cardiovascular;  Laterality: N/A;  . CARDIAC CATHETERIZATION N/A  11/19/2015   Procedure: Coronary Stent Intervention;  Surgeon: Jettie Booze, MD;  Location: [facility];  Service: Cardiovascular;  Laterality: N/A;  . CORONARY ANGIOPLASTY WITH STENT PLACEMENT  11/19/2015   "2 stents"  . ENDOSCOPIC RETROGRADE CHOLANGIOPANCREATOGRAPHY (ERCP) WITH PROPOFOL N/A 05/22/2020   Procedure: ENDOSCOPIC RETROGRADE CHOLANGIOPANCREATOGRAPHY (ERCP) WITH PROPOFOL;  Surgeon: Rush Landmark Telford Nab., MD;  Location: [facility];  Service: Gastroenterology;  Laterality: N/A;  . ESOPHAGOGASTRODUODENOSCOPY (EGD) WITH PROPOFOL N/A 05/22/2020   Procedure: ESOPHAGOGASTRODUODENOSCOPY (EGD) WITH PROPOFOL;  Surgeon: Rush Landmark Telford Nab., MD;  Location: [facility];  Service: Gastroenterology;  Laterality: N/A;  . IR CHOLANGIOGRAM EXISTING TUBE  03/23/2020  . IR EXCHANGE BILIARY DRAIN  04/22/2020  . IR EXCHANGE BILIARY DRAIN  06/10/2020  . IR PERC CHOLECYSTOSTOMY  03/06/2020  . IR RADIOLOGIST EVAL & MGMT  04/06/2020  . IR RADIOLOGIST EVAL & MGMT  04/20/2020  . other     unknown abdominal surgery in childhood  . REMOVAL OF STONES  05/22/2020   Procedure: REMOVAL OF STONES;  Surgeon: Rush Landmark Telford Nab., MD;  Location: [facility];  Service: Gastroenterology;;  . Joan Mayans  05/22/2020   Procedure: Joan Mayans;  Surgeon: Irving Copas., MD;  Location: [facility];  Service: Gastroenterology;;  . UPPER ESOPHAGEAL ENDOSCOPIC ULTRASOUND (EUS) N/A 05/22/2020   Procedure: UPPER ESOPHAGEAL ENDOSCOPIC ULTRASOUND (EUS);  Surgeon: Irving Copas., MD;  Location: [facility];  Service: Gastroenterology;  Laterality: N/A;       Family History  Problem Relation Age of Onset  . Diabetes Mother   . Cancer Mother        unkown type  . Hypertension Father   . Colon cancer Neg Hx     Social History   Tobacco Use  . Smoking status: Former Smoker    Packs/day: 0.12    Years: 49.00    Pack years: 5.88    Types: Cigarettes    Start date: 07/20/2012     Quit date: 02/18/2015    Years since quitting: 5.3  . Smokeless tobacco: Never Used  Vaping Use  . Vaping Use: Never used  Substance Use Topics  . Alcohol use: Not Currently    Alcohol/week: 6.0 standard drinks    Types: 6 Standard drinks or equivalent per week  . Drug use: No    Home Medications Prior to Admission medications   Medication Sig Start Date End Date Taking? Authorizing Provider  allopurinol (ZYLOPRIM) 100 MG tablet Take 1.5 tablets (150 mg total) by mouth daily. 06/09/20  Yes Agyei, Caprice Kluver, MD  amLODipine (NORVASC) 10 MG tablet Take 1 tablet (10 mg total) by mouth daily. 11/03/19  Yes Axel Filler, MD  aspirin EC 81 MG tablet Take 1 tablet (81 mg total) by mouth daily. 11/17/15  Yes Bensimhon, Shaune Pascal, MD  BIKTARVY 50-200-25 MG TABS tablet TAKE 1 TABLET BY MOUTH DAILY Patient taking differently: Take 1 tablet by mouth daily. 05/17/20  Yes Campbell Riches, MD  cholecalciferol (VITAMIN D) 25 MCG tablet Take 1 tablet (1,000 Units total) by mouth daily. 05/27/20 07/26/20 Yes Lacinda Axon, MD  dexamethasone (DECADRON) 4 MG tablet Take 4 mg by mouth See admin instructions. Take 1 tablet two times daily with meals for 14  days.   Yes [provider]  losartan (COZAAR) 50 MG tablet Take 1 tablet (50 mg total) by mouth daily. 03/30/20  Yes Mosetta Anis, MD  metFORMIN (GLUCOPHAGE) 1000 MG tablet Take 1 tablet (1,000 mg total) by mouth 2 (two) times daily with a meal. 06/09/20  Yes Agyei, Caprice Kluver, MD  metoprolol succinate (TOPROL-XL) 50 MG 24 hr tablet TAKE 1 TABLET BY MOUTH DAILY WITH OR IMMEDAITELY FOLLOWING A MEAL Patient taking differently: Take 50 mg by mouth daily. TAKE 1 TABLET BY MOUTH DAILY WITH OR IMMEDAITELY FOLLOWING A MEAL 06/03/20  Yes Agyei, Caprice Kluver, MD  Multiple Vitamin (MULTIVITAMIN WITH MINERALS) TABS tablet Take 1 tablet by mouth daily.   Yes [provider]  nitroGLYCERIN (NITROSTAT) 0.4 MG SL tablet Place 1 tablet (0.4 mg total) under  the tongue every 5 (five) minutes as needed for chest pain. 11/17/15  Yes Bensimhon, Shaune Pascal, MD  ondansetron (ZOFRAN) 8 MG tablet Take 1 tablet (8 mg total) by mouth every 8 (eight) hours as needed for refractory nausea / vomiting. Start on day 3 after chemotherapy. 06/01/20  Yes Gorsuch, Ni, MD  polyethylene glycol powder (GLYCOLAX/MIRALAX) 17 GM/SCOOP powder Take 17 g by mouth daily.   Yes [provider]  prochlorperazine (COMPAZINE) 10 MG tablet Take 1 tablet (10 mg total) by mouth every 6 (six) hours as needed (Nausea or vomiting). 06/01/20  Yes Gorsuch, Ni, MD  sodium chloride, PF, 0.9 % injection 5 mLs See admin instructions. 5 ml's into drain once a day 05/25/20  Yes [provider]  Accu-Chek Softclix Lancets lancets Check blood sugars 2 times a day while on steroids 05/28/20   Jean Rosenthal, MD  blood glucose meter kit and supplies KIT Dispense based on patient and insurance preference. Use up to four times daily as directed. (FOR ICD-9 250.00, 250.01). 08/03/15   Emokpae, Ejiroghene E, MD  Blood Glucose Monitoring Suppl (ACCU-CHEK GUIDE) w/Device KIT Check blood sugar 2 times a day while on steroids 05/28/20   Agyei, Caprice Kluver, MD  Continuous Blood Gluc Receiver (FREESTYLE LIBRE 2 READER SYSTM) DEVI 1 Device by Does not apply route 3 (three) times daily. 01/08/19   Jean Rosenthal, MD  Continuous Blood Gluc Sensor (FREESTYLE LIBRE 2 SENSOR SYSTM) MISC 1 Device by Does not apply route 3 (three) times daily. 01/08/19   Jean Rosenthal, MD  glucose blood (ACCU-CHEK GUIDE) test strip Check blood sugar 2 times per day while on steroids 05/28/20   Jean Rosenthal, MD  [facility] DELICA LANCETS FINE MISC Check blood sugar up to 1 time a day 12/22/15   Rice, Resa Miner, MD    Allergies    Patient has no known allergies.  Review of Systems   Review of Systems  Constitutional: Negative for fever.  HENT: Negative for congestion.   Eyes: Negative for visual disturbance.  Respiratory:  Negative for shortness of breath.   Cardiovascular: Negative for chest pain.  Gastrointestinal: Negative for abdominal pain, diarrhea and vomiting.  Skin: Negative for rash.  Neurological: Negative for headaches.    Physical Exam Updated Vital Signs BP 128/71   Pulse 60   Temp 97.6 F (36.4 C) (Oral)   Resp 16   SpO2 100%   Physical Exam Vitals and nursing note reviewed.  Constitutional:      Appearance: Normal appearance.  HENT:     Head: Normocephalic.     Mouth/Throat:     Mouth: Mucous membranes are moist.  Cardiovascular:     Rate and Rhythm: Normal rate.  Pulmonary:     Effort: Pulmonary effort is normal. No respiratory distress.  Abdominal:     Palpations: Abdomen is soft.     Tenderness: There is no abdominal tenderness.  Skin:    General: Skin is warm.  Neurological:     Mental Status: He is alert and oriented to person, place, and time. Mental status is at baseline.  Psychiatric:        Mood and Affect: Mood normal.     ED Results / Procedures / Treatments   Labs (all labs ordered are listed, but only abnormal results are displayed) Labs Reviewed  BASIC METABOLIC PANEL - Abnormal; Notable for the following components:      Result Value   Sodium 132 (*)    Chloride 95 (*)    Glucose, Bld 547 (*)    BUN 36 (*)    All other components within normal limits  CBC - Abnormal; Notable for the following components:   WBC 12.2 (*)    RBC 3.03 (*)    Hemoglobin 9.2 (*)    HCT 27.8 (*)    RDW 17.5 (*)    All other components within normal limits  URINALYSIS, ROUTINE W REFLEX MICROSCOPIC - Abnormal; Notable for the following components:   Glucose, UA >=500 (*)    All other components within normal limits  CBG MONITORING, ED - Abnormal; Notable for the following components:   Glucose-Capillary 492 (*)    All other components within normal limits  CBG MONITORING, ED - Abnormal; Notable for the following components:   Glucose-Capillary >600 (*)    All other  components within normal limits  CBG MONITORING, ED    EKG None  Radiology No results found.  Procedures Procedures (including critical care time)  Medications Ordered in ED Medications  insulin aspart (novoLOG) injection 7 Units (7 Units Subcutaneous Given 06/14/20 1315)    ED Course  I have reviewed the triage vital signs and the nursing notes.  Pertinent labs & imaging results that were available during my care of the patient were reviewed by me and considered in my medical decision making (see chart for details).    MDM Rules/Calculators/A&P                          Emergency department concern for hyperglycemia.  Patient recently finished a course of dexamethasone I believe this is the culprit for the hyperglycemia.  He otherwise has a pseudohyponatremia, baseline anemia no other acute findings.  No findings consistent for DKA/HHS.  Patient is drinking water, received subcu insulin, we will recheck blood sugar, if trending in the appropriate way can discharge and advise for outpatient follow-up now that he is off the steroids. Patient signed out to Dr. Johnney Killian.  Final Clinical Impression(s) / ED Diagnoses Final diagnoses:  None    Rx / DC Orders ED Discharge Orders    None       Lorelle Gibbs, DO 06/14/20 1526

## 2020-06-14 NOTE — ED Triage Notes (Signed)
Pt reports he is here today due to hyperglycemia. Pt reports he was having routine follow up with his PCP when they realized his blood sugar was in the 400's.

## 2020-06-14 NOTE — Assessment & Plan Note (Signed)
His liver enzymes are improving Observe closely for now

## 2020-06-14 NOTE — Assessment & Plan Note (Signed)
He has slight worsening hypercalcemia However, it could be related to profound dehydration, related to hyperglycemia He has completed a course of dexamethasone lately I recommend observation for now

## 2020-06-14 NOTE — Assessment & Plan Note (Signed)
He has slight acute on chronic renal failure likely due to uncontrolled hyperglycemia We discussed the importance of drinking plenty of fluids

## 2020-06-14 NOTE — Progress Notes (Signed)
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[facility]) He had recent tube exchange The percutaneous drain is not working His liver enzymes are improving His recent tumor marker is better Clinically, I felt that he has responded to treatment He is scheduled for port placement next week and chemotherapy the following week However, due to uncontrolled hyperglycemia, he is directed to the emergency department after consultation with his primary care doctor  Uncontrolled diabetes mellitus ([place]) He has uncontrolled diabetes According to the patient, his blood pressure ranges between high 400 to as high as 692 on June 09, 2020 His blood sugar today is very high I spoke with his primary care doctor who recommended him to go to [facility] emergency department for further evaluation and management Recent exposure to dexamethasone is likely the culprit that cause severe, uncontrolled hyperglycemia I do not plan to restart him on dexamethasone again Previously, this was prescribed for management of hypercalcemia  Hypercalcemia of malignancy He has slight worsening hypercalcemia However, it could be related to profound dehydration, related to hyperglycemia He has completed a course of dexamethasone lately I recommend observation for now  CKD (chronic kidney disease) stage 3, GFR 30-59 ml/min (HCC) He has slight acute on chronic renal failure likely due to uncontrolled hyperglycemia We discussed the importance of drinking plenty of fluids  Elevated liver enzymes His liver enzymes are improving Observe closely for now   No orders of the defined types were placed in this encounter.   All questions were  answered. The patient knows to call the clinic with any problems, questions or concerns. The total time spent in the appointment was 40 minutes encounter with patients including review of chart and various tests results, discussions about plan of care and coordination of care plan   [name], MD 06/14/2020 10:34 AM  INTERVAL HISTORY: Please see below for problem oriented charting. Returns for further follow-up Previously, we discussed the importance of getting enough liquids and close management of diabetes The patient show me documentation of his blood sugar over the last 5 days His blood sugar was extremely high, as high as 696 on December 22 He is trying to drink at least 8 cups of water He did not drink any water today Denies nausea or vomiting I consulted interventional radiologist last week to check on his percutaneous drain and that was exchanged It starts to drain nicely He denies recent fever or chills SUMMARY OF ONCOLOGIC HISTORY: Oncology History  Hepatobiliary cancer ([place])  05/20/2020 Imaging   MRI with MRCP 1. Masslike area in the central liver obstructing bile ducts, common hepatic duct and narrowing the portal vein extending from the gallbladder fossa a highly suspicious for aggressive biliary neoplasm/cholangiocarcinoma/gallbladder carcinoma. Superimposed infection is also possible. 2. Pancreas is largely normal but inseparable from enhancing soft tissue that extends from the porta hepatis inferiorly and with irregular enhancement that surrounds an area of presumed necrotic tumor and or infected necrotic debris. 3. Separate smaller area showing heterogeneous enhancement likely reflects additional site of tumor in the liver in the inferior medial LEFT hepatic lobe. 4. Small volume ascites about the liver. 5. Biliary drainage tube appears to remain in place, not well evaluated except on MRCP sequences were a can be  faintly seen   05/22/2020 Pathology Results   FINAL  MICROSCOPIC DIAGNOSIS:   A. DUODENUM, BIOPSY:  - Duodenal mucosa with no significant pathologic findings.  - Negative for increased intraepithelial lymphocytes and villous  architectural changes.   B. STOMACH, BIOPSY:  - Gastric antral mucosa with mild reactive gastropathy.  - Gastric oxyntic mucosa with mild chronic gastritis.  - Warthin-Starry stain is negative for Helicobacter pylori.   C. BILIARY TISSUE, BIOPSY:  - Poorly differentiated carcinoma, favor adenocarcinoma.   ADDENDUM:   (C) Immunohistochemistry for MOC31 and CK7 is positive.  CK20, CDX-2, HEPAR-1, Glypican-3 and Arginase-1 are negative.  The immunophenotype favors pancreaticobiliary origin in this otherwise poorly differentiated carcinoma.      05/27/2020 Imaging   CT chest 1. Hypoenhancing and potentially centrally necrotic mass along the gallbladder fossa involving the right hepatic lobe and left hepatic lobe, with 2 biliary stents partially included on today's examination, and a small amount of pneumobilia. 2. No findings of metastatic disease to the chest. 3. Coronary, aortic arch, and branch vessel atherosclerotic vascular disease. 4. Paraseptal emphysema. 5. Mild bilateral gynecomastia. 6. Lipoma between the right pectoralis major and minor muscles. 7. Emphysema and aortic atherosclerosis.   05/27/2020 Imaging   CT abdomen and pelvis 1. Stable appearing ill-defined infiltrating neoplastic process in the central liver inferiorly. This is most likely due to an infiltrating biliary neoplasm (cholangiocarcinoma). 2. Resolution of intrahepatic biliary dilatation due to the biliary stent. 3. Stable marked narrowing and irregularity of the middle and right portal veins with possible partial thrombosis. 4. Age advanced atherosclerotic calcifications involving the aorta and branch vessels. 5. Small left pleural effusion with overlying atelectasis. 6. Aortic atherosclerosis.   06/01/2020 Initial Diagnosis    Hepatobiliary cancer ([place])   06/01/2020 Cancer Staging   Staging form: Distal Bile Duct, AJCC 8th Edition - Clinical stage from 06/01/2020: Stage IIB (cT2, cN1, cM0) - Signed by [name], MD on 06/01/2020   06/03/2020 -  Chemotherapy   The patient had Gemcitabine and oxaliplatin for chemotherapy treatment.     06/09/2020 Tumor Marker   Patient's tumor was tested for the following markers CA19-9 Results of the tumor marker test revealed 100     REVIEW OF SYSTEMS:   Constitutional: Denies fevers, chills or abnormal weight loss Eyes: Denies blurriness of vision Ears, nose, mouth, throat, and face: Denies mucositis or sore throat Respiratory: Denies cough, dyspnea or wheezes Cardiovascular: Denies palpitation, chest discomfort or lower extremity swelling Gastrointestinal:  Denies nausea, heartburn or change in bowel habits Skin: Denies abnormal skin rashes Lymphatics: Denies new lymphadenopathy or easy bruising Neurological:Denies numbness, tingling or new weaknesses Behavioral/Psych: Mood is stable, no new changes  All other systems were reviewed with the patient and are negative.  I have reviewed the past medical history, past surgical history, social history and family history with the patient and they are unchanged from previous note.  ALLERGIES:  has No Known Allergies.  MEDICATIONS:  Current Outpatient Medications  Medication Sig Dispense Refill  . Accu-Chek Softclix Lancets lancets Check blood sugars 2 times a day while on steroids 100 each 2  . allopurinol (ZYLOPRIM) 100 MG tablet Take 1.5 tablets (150 mg total) by mouth daily. 30 tablet 3  . amLODipine (NORVASC) 10 MG tablet Take 1 tablet (10 mg total) by mouth daily. 90 tablet 3  . aspirin EC 81 MG tablet Take 1 tablet (81 mg total) by mouth daily. 90 tablet 3  . BIKTARVY 50-200-25 MG TABS tablet TAKE 1 TABLET  BY MOUTH DAILY (Patient taking differently: Take 1 tablet by mouth daily. ) 90 tablet 1  . blood glucose  meter kit and supplies KIT Dispense based on patient and insurance preference. Use up to four times daily as directed. (FOR ICD-9 250.00, 250.01). 1 each 0  . Blood Glucose Monitoring Suppl (ACCU-CHEK GUIDE) w/Device KIT Check blood sugar 2 times a day while on steroids 1 kit 1  . cholecalciferol (VITAMIN D) 25 MCG tablet Take 1 tablet (1,000 Units total) by mouth daily. 60 tablet 0  . Continuous Blood Gluc Receiver (FREESTYLE LIBRE 2 READER SYSTM) DEVI 1 Device by Does not apply route 3 (three) times daily. 1 Device 0  . Continuous Blood Gluc Sensor (FREESTYLE LIBRE 2 SENSOR SYSTM) MISC 1 Device by Does not apply route 3 (three) times daily. 1 each 0  . glucose blood (ACCU-CHEK GUIDE) test strip Check blood sugar 2 times per day while on steroids 100 each 2  . losartan (COZAAR) 50 MG tablet Take 1 tablet (50 mg total) by mouth daily. 90 tablet 3  . metFORMIN (GLUCOPHAGE) 1000 MG tablet Take 1 tablet (1,000 mg total) by mouth 2 (two) times daily with a meal. 30 tablet 3  . metoprolol succinate (TOPROL-XL) 50 MG 24 hr tablet TAKE 1 TABLET BY MOUTH DAILY WITH OR IMMEDAITELY FOLLOWING A MEAL 30 tablet 3  . Multiple Vitamin (MULTIVITAMIN WITH MINERALS) TABS tablet Take 1 tablet by mouth daily.    . nitroGLYCERIN (NITROSTAT) 0.4 MG SL tablet Place 1 tablet (0.4 mg total) under the tongue every 5 (five) minutes as needed for chest pain. 25 tablet 3  . ondansetron (ZOFRAN) 8 MG tablet Take 1 tablet (8 mg total) by mouth every 8 (eight) hours as needed for refractory nausea / vomiting. Start on day 3 after chemotherapy. 30 tablet 1  . ONETOUCH DELICA LANCETS FINE MISC Check blood sugar up to 1 time a day 100 each 12  . prochlorperazine (COMPAZINE) 10 MG tablet Take 1 tablet (10 mg total) by mouth every 6 (six) hours as needed (Nausea or vomiting). 30 tablet 1   No current facility-administered medications for this visit.    PHYSICAL EXAMINATION: ECOG PERFORMANCE STATUS: 1 - Symptomatic but completely  ambulatory  Vitals:   06/14/20 0936  BP: 100/71  Pulse: (!) 53  Resp: 18  Temp: (!) 97.5 F (36.4 C)  SpO2: 100%   Filed Weights   06/14/20 0936  Weight: 150 lb 9.6 oz (68.3 kg)    GENERAL:alert, no distress and comfortable Musculoskeletal:no cyanosis of digits and no clubbing  NEURO: alert & oriented x 3 with fluent speech, no focal motor/sensory deficits  LABORATORY DATA:  I have reviewed the data as listed    Component Value Date/Time   NA 131 (L) 06/14/2020 0912   NA 138 03/30/2020 0938   K 5.0 06/14/2020 0912   CL 95 (L) 06/14/2020 0912   CO2 25 06/14/2020 0912   GLUCOSE 522 (HH) 06/14/2020 0912   BUN 37 (H) 06/14/2020 0912   BUN 20 03/30/2020 0938   CREATININE 1.37 (H) 06/14/2020 0912   CREATININE 1.24 03/25/2020 0928   CALCIUM 10.5 (H) 06/14/2020 0912   PROT 7.3 06/14/2020 0912   PROT 7.4 03/30/2020 0938   ALBUMIN 2.4 (L) 06/14/2020 0912   ALBUMIN 4.2 03/30/2020 0938   AST 40 06/14/2020 0912   ALT 65 (H) 06/14/2020 0912   ALKPHOS 433 (H) 06/14/2020 0912   BILITOT 1.4 (H) 06/14/2020 0912   BILITOT 0.2  03/30/2020 0938   GFRNONAA 59 (L) 06/14/2020 0912   GFRNONAA 62 03/25/2020 0928   GFRAA 83 03/30/2020 0938   GFRAA 72 03/25/2020 0928    No results found for: SPEP, UPEP  Lab Results  Component Value Date   WBC 11.0 (H) 06/14/2020   NEUTROABS 9.7 (H) 06/14/2020   HGB 9.3 (L) 06/14/2020   HCT 28.6 (L) 06/14/2020   MCV 90.8 06/14/2020   PLT 264 06/14/2020      Chemistry      Component Value Date/Time   NA 131 (L) 06/14/2020 0912   NA 138 03/30/2020 0938   K 5.0 06/14/2020 0912   CL 95 (L) 06/14/2020 0912   CO2 25 06/14/2020 0912   BUN 37 (H) 06/14/2020 0912   BUN 20 03/30/2020 0938   CREATININE 1.37 (H) 06/14/2020 0912   CREATININE 1.24 03/25/2020 0928      Component Value Date/Time   CALCIUM 10.5 (H) 06/14/2020 0912   ALKPHOS 433 (H) 06/14/2020 0912   AST 40 06/14/2020 0912   ALT 65 (H) 06/14/2020 0912   BILITOT 1.4 (H) 06/14/2020 0912    BILITOT 0.2 03/30/2020 4103

## 2020-06-14 NOTE — Assessment & Plan Note (Signed)
He has uncontrolled diabetes According to the patient, his blood pressure ranges between high 400 to as high as 692 on June 09, 2020 His blood sugar today is very high I spoke with his primary care doctor who recommended him to go to [facility] emergency department for further evaluation and management Recent exposure to dexamethasone is likely the culprit that cause severe, uncontrolled hyperglycemia I do not plan to restart him on dexamethasone again Previously, this was prescribed for management of hypercalcemia

## 2020-06-14 NOTE — Discharge Instructions (Addendum)
1.  Resume taking your Metformin twice daily as previously.  Monitor your blood sugars closely on your home machine.  Write down the values so you can show them to your doctor. 2.  Follow your diabetic diet. 3.  Return to the emergency department immediately if you are feeling weak, develop nausea vomiting, fever or other concerning symptoms. 4.  Schedule follow-up with your doctor within the next 2 to 4 days.

## 2020-06-14 NOTE — Assessment & Plan Note (Signed)
He had recent tube exchange The percutaneous drain is not working His liver enzymes are improving His recent tumor marker is better Clinically, I felt that he has responded to treatment He is scheduled for port placement next week and chemotherapy the following week However, due to uncontrolled hyperglycemia, he is directed to the emergency department after consultation with his primary care doctor

## 2020-06-14 NOTE — ED Provider Notes (Signed)
SQ insulin. Recheck CBG. Anticipate d/c.  Patient's blood sugar has responded and is now 397.  Patient denies any symptoms.  He reports he feels fine.  He has not been having any vomiting, diarrhea, fever, chills.  He reports that his metformin was decreased in the past few weeks.  He reports he is not sure why but assumes his blood sugars must of been low.  He reports he thought he was doing really well when he was taking the Metformin twice a day. Physical Exam  BP 127/70   Pulse (!) 56   Temp 97.6 F (36.4 C) (Oral)   Resp 16   SpO2 100%   Physical Exam Patient is alert and nontoxic.  No respiratory distress.  Mental status clear. ED Course/Procedures     Procedures  MDM  At this time patient stable for discharge.  We reviewed resuming his previous Metformin dose at twice a day.  Patient reports he has a monitor and can check his CBGs at home and keep a record.  Return precautions reviewed.  Patient will follow up with PCP.       Arby Barrette, MD 06/14/20 [phone]

## 2020-06-15 ENCOUNTER — Emergency Department (HOSPITAL_COMMUNITY): Payer: Medicare HMO

## 2020-06-15 ENCOUNTER — Other Ambulatory Visit: Payer: Self-pay

## 2020-06-15 ENCOUNTER — Inpatient Hospital Stay (HOSPITAL_COMMUNITY)
Admission: EM | Admit: 2020-06-15 | Discharge: 2020-06-19 | DRG: 974 | Disposition: E | Payer: Medicare HMO | Attending: Pulmonary Disease | Admitting: Pulmonary Disease

## 2020-06-15 ENCOUNTER — Other Ambulatory Visit: Payer: Self-pay | Admitting: Hematology and Oncology

## 2020-06-15 ENCOUNTER — Other Ambulatory Visit: Payer: Self-pay | Admitting: *Deleted

## 2020-06-15 DIAGNOSIS — I129 Hypertensive chronic kidney disease with stage 1 through stage 4 chronic kidney disease, or unspecified chronic kidney disease: Secondary | ICD-10-CM | POA: Diagnosis present

## 2020-06-15 DIAGNOSIS — E7800 Pure hypercholesterolemia, unspecified: Secondary | ICD-10-CM | POA: Diagnosis present

## 2020-06-15 DIAGNOSIS — D631 Anemia in chronic kidney disease: Secondary | ICD-10-CM | POA: Diagnosis present

## 2020-06-15 DIAGNOSIS — C249 Malignant neoplasm of biliary tract, unspecified: Secondary | ICD-10-CM | POA: Diagnosis present

## 2020-06-15 DIAGNOSIS — Z7984 Long term (current) use of oral hypoglycemic drugs: Secondary | ICD-10-CM

## 2020-06-15 DIAGNOSIS — E872 Acidosis, unspecified: Secondary | ICD-10-CM

## 2020-06-15 DIAGNOSIS — K7200 Acute and subacute hepatic failure without coma: Secondary | ICD-10-CM | POA: Diagnosis present

## 2020-06-15 DIAGNOSIS — B20 Human immunodeficiency virus [HIV] disease: Secondary | ICD-10-CM | POA: Diagnosis present

## 2020-06-15 DIAGNOSIS — Z8249 Family history of ischemic heart disease and other diseases of the circulatory system: Secondary | ICD-10-CM

## 2020-06-15 DIAGNOSIS — R188 Other ascites: Secondary | ICD-10-CM | POA: Diagnosis present

## 2020-06-15 DIAGNOSIS — I469 Cardiac arrest, cause unspecified: Secondary | ICD-10-CM | POA: Diagnosis present

## 2020-06-15 DIAGNOSIS — Z833 Family history of diabetes mellitus: Secondary | ICD-10-CM

## 2020-06-15 DIAGNOSIS — Z79899 Other long term (current) drug therapy: Secondary | ICD-10-CM

## 2020-06-15 DIAGNOSIS — Z955 Presence of coronary angioplasty implant and graft: Secondary | ICD-10-CM

## 2020-06-15 DIAGNOSIS — Z66 Do not resuscitate: Secondary | ICD-10-CM | POA: Diagnosis not present

## 2020-06-15 DIAGNOSIS — Z7189 Other specified counseling: Secondary | ICD-10-CM

## 2020-06-15 DIAGNOSIS — Z87891 Personal history of nicotine dependence: Secondary | ICD-10-CM

## 2020-06-15 DIAGNOSIS — E1165 Type 2 diabetes mellitus with hyperglycemia: Secondary | ICD-10-CM | POA: Diagnosis present

## 2020-06-15 DIAGNOSIS — M10072 Idiopathic gout, left ankle and foot: Secondary | ICD-10-CM

## 2020-06-15 DIAGNOSIS — B182 Chronic viral hepatitis C: Secondary | ICD-10-CM | POA: Diagnosis present

## 2020-06-15 DIAGNOSIS — Z515 Encounter for palliative care: Secondary | ICD-10-CM

## 2020-06-15 DIAGNOSIS — E785 Hyperlipidemia, unspecified: Secondary | ICD-10-CM | POA: Diagnosis present

## 2020-06-15 DIAGNOSIS — I2510 Atherosclerotic heart disease of native coronary artery without angina pectoris: Secondary | ICD-10-CM | POA: Diagnosis present

## 2020-06-15 DIAGNOSIS — A419 Sepsis, unspecified organism: Principal | ICD-10-CM | POA: Diagnosis present

## 2020-06-15 DIAGNOSIS — D630 Anemia in neoplastic disease: Secondary | ICD-10-CM | POA: Diagnosis present

## 2020-06-15 DIAGNOSIS — E1122 Type 2 diabetes mellitus with diabetic chronic kidney disease: Secondary | ICD-10-CM | POA: Diagnosis present

## 2020-06-15 DIAGNOSIS — R6521 Severe sepsis with septic shock: Secondary | ICD-10-CM | POA: Diagnosis present

## 2020-06-15 DIAGNOSIS — N183 Chronic kidney disease, stage 3 unspecified: Secondary | ICD-10-CM | POA: Diagnosis present

## 2020-06-15 DIAGNOSIS — Z20822 Contact with and (suspected) exposure to covid-19: Secondary | ICD-10-CM | POA: Diagnosis present

## 2020-06-15 DIAGNOSIS — J9601 Acute respiratory failure with hypoxia: Secondary | ICD-10-CM | POA: Diagnosis present

## 2020-06-15 DIAGNOSIS — N179 Acute kidney failure, unspecified: Secondary | ICD-10-CM | POA: Diagnosis present

## 2020-06-15 LAB — BLOOD GAS, ARTERIAL
Acid-base deficit: 24.9 mmol/L — ABNORMAL HIGH (ref 0.0–2.0)
Bicarbonate: 7.1 mmol/L — ABNORMAL LOW (ref 20.0–28.0)
FIO2: 100
O2 Saturation: 96.1 %
Patient temperature: 97.4
pCO2 arterial: 45.7 mmHg (ref 32.0–48.0)
pH, Arterial: 6.819 — CL (ref 7.350–7.450)
pO2, Arterial: 149 mmHg — ABNORMAL HIGH (ref 83.0–108.0)

## 2020-06-15 LAB — PROTIME-INR
INR: 1.8 — ABNORMAL HIGH (ref 0.8–1.2)
Prothrombin Time: 19.9 seconds — ABNORMAL HIGH (ref 11.4–15.2)

## 2020-06-15 LAB — COMPREHENSIVE METABOLIC PANEL
ALT: 2134 U/L — ABNORMAL HIGH (ref 0–44)
AST: 3784 U/L — ABNORMAL HIGH (ref 15–41)
Albumin: 2.6 g/dL — ABNORMAL LOW (ref 3.5–5.0)
Alkaline Phosphatase: 897 U/L — ABNORMAL HIGH (ref 38–126)
Anion gap: 31 — ABNORMAL HIGH (ref 5–15)
BUN: 57 mg/dL — ABNORMAL HIGH (ref 8–23)
CO2: 7 mmol/L — ABNORMAL LOW (ref 22–32)
Calcium: 9.2 mg/dL (ref 8.9–10.3)
Chloride: 96 mmol/L — ABNORMAL LOW (ref 98–111)
Creatinine, Ser: 3.01 mg/dL — ABNORMAL HIGH (ref 0.61–1.24)
GFR, Estimated: 23 mL/min — ABNORMAL LOW (ref >60)
Glucose, Bld: 212 mg/dL — ABNORMAL HIGH (ref 70–99)
Potassium: 5.2 mmol/L — ABNORMAL HIGH (ref 3.5–5.1)
Sodium: 134 mmol/L — ABNORMAL LOW (ref 135–145)
Total Bilirubin: 2.8 mg/dL — ABNORMAL HIGH (ref 0.3–1.2)
Total Protein: 6.7 g/dL (ref 6.5–8.1)

## 2020-06-15 LAB — CBC WITH DIFFERENTIAL/PLATELET
Abs Immature Granulocytes: 0.37 10*3/uL — ABNORMAL HIGH (ref 0.00–0.07)
Basophils Absolute: 0.1 10*3/uL (ref 0.0–0.1)
Basophils Relative: 0 %
Eosinophils Absolute: 0 10*3/uL (ref 0.0–0.5)
Eosinophils Relative: 0 %
HCT: 34 % — ABNORMAL LOW (ref 39.0–52.0)
Hemoglobin: 9.9 g/dL — ABNORMAL LOW (ref 13.0–17.0)
Immature Granulocytes: 2 %
Lymphocytes Relative: 12 %
Lymphs Abs: 2.7 10*3/uL (ref 0.7–4.0)
MCH: 30.2 pg (ref 26.0–34.0)
MCHC: 29.1 g/dL — ABNORMAL LOW (ref 30.0–36.0)
MCV: 103.7 fL — ABNORMAL HIGH (ref 80.0–100.0)
Monocytes Absolute: 0.8 10*3/uL (ref 0.1–1.0)
Monocytes Relative: 4 %
Neutro Abs: 19.1 10*3/uL — ABNORMAL HIGH (ref 1.7–7.7)
Neutrophils Relative %: 82 %
Platelets: 244 10*3/uL (ref 150–400)
RBC: 3.28 MIL/uL — ABNORMAL LOW (ref 4.22–5.81)
RDW: 17.8 % — ABNORMAL HIGH (ref 11.5–15.5)
WBC: 23.1 10*3/uL — ABNORMAL HIGH (ref 4.0–10.5)
nRBC: 1.9 % — ABNORMAL HIGH (ref 0.0–0.2)

## 2020-06-15 LAB — CBG MONITORING, ED: Glucose-Capillary: 145 mg/dL — ABNORMAL HIGH (ref 70–99)

## 2020-06-15 LAB — RESP PANEL BY RT-PCR (FLU A&B, COVID) ARPGX2
Influenza A by PCR: NEGATIVE
Influenza B by PCR: NEGATIVE
SARS Coronavirus 2 by RT PCR: NEGATIVE

## 2020-06-15 LAB — TROPONIN I (HIGH SENSITIVITY)
Troponin I (High Sensitivity): 95 ng/L — ABNORMAL HIGH (ref <18)
Troponin I (High Sensitivity): 153 ng/L (ref <18)

## 2020-06-15 LAB — APTT: aPTT: 56 seconds — ABNORMAL HIGH (ref 24–36)

## 2020-06-15 LAB — LIPASE, BLOOD: Lipase: 115 U/L — ABNORMAL HIGH (ref 11–51)

## 2020-06-15 LAB — LACTIC ACID, PLASMA
Lactic Acid, Venous: >11 mmol/L (ref 0.5–1.9)
Lactic Acid, Venous: >11 mmol/L (ref 0.5–1.9)

## 2020-06-15 MED ORDER — NOREPINEPHRINE 4 MG/250ML-% IV SOLN
2.0000 ug/min | INTRAVENOUS | Status: DC
  Administered 2020-06-15: 20:00:00 3 ug/min via INTRAVENOUS
  Filled 2020-06-15: qty 250

## 2020-06-15 MED ORDER — SODIUM BICARBONATE 8.4 % IV SOLN
50.0000 meq | Freq: Once | INTRAVENOUS | Status: AC
  Administered 2020-06-15: 21:00:00 50 meq via INTRAVENOUS

## 2020-06-15 MED ORDER — STERILE WATER FOR INJECTION IV SOLN
INTRAVENOUS | Status: DC
  Filled 2020-06-15: qty 150

## 2020-06-15 MED ORDER — VANCOMYCIN HCL IN DEXTROSE 1-5 GM/200ML-% IV SOLN
1000.0000 mg | Freq: Once | INTRAVENOUS | Status: AC
  Administered 2020-06-15: 22:00:00 1000 mg via INTRAVENOUS
  Filled 2020-06-15: qty 200

## 2020-06-15 MED ORDER — FENTANYL 2500MCG IN NS 250ML (10MCG/ML) PREMIX INFUSION
0.0000 ug/h | INTRAVENOUS | Status: DC
  Filled 2020-06-15: qty 250

## 2020-06-15 MED ORDER — HYDROMORPHONE HCL 1 MG/ML IJ SOLN
1.0000 mg | Freq: Once | INTRAMUSCULAR | Status: AC
  Administered 2020-06-15: 20:00:00 1 mg via INTRAVENOUS
  Filled 2020-06-15: qty 1

## 2020-06-15 MED ORDER — ALLOPURINOL 100 MG PO TABS
150.0000 mg | ORAL_TABLET | Freq: Every day | ORAL | 3 refills | Status: AC
Start: 2020-06-15 — End: ?

## 2020-06-15 MED ORDER — SODIUM CHLORIDE 0.9 % IV BOLUS
1000.0000 mL | Freq: Once | INTRAVENOUS | Status: AC
  Administered 2020-06-15: 21:00:00 1000 mL via INTRAVENOUS

## 2020-06-15 MED ORDER — SODIUM CHLORIDE 0.9 % IV SOLN
250.0000 mL | INTRAVENOUS | Status: DC
  Administered 2020-06-15: 22:00:00 250 mL via INTRAVENOUS

## 2020-06-15 MED ORDER — MIDAZOLAM HCL 2 MG/2ML IJ SOLN
INTRAMUSCULAR | Status: AC
  Filled 2020-06-15: qty 4

## 2020-06-15 MED ORDER — METRONIDAZOLE 500 MG PO TABS: 500.0000 mg | ORAL_TABLET | Freq: Three times a day (TID) | ORAL | Status: DC

## 2020-06-15 MED ORDER — ATROPINE SULFATE 1 MG/ML IJ SOLN
INTRAMUSCULAR | Status: AC | PRN
  Administered 2020-06-15: 1 mg via INTRAVENOUS

## 2020-06-15 MED ORDER — EPINEPHRINE HCL 5 MG/250ML IV SOLN IN NS
0.5000 ug/min | INTRAVENOUS | Status: DC
  Administered 2020-06-15: 0.5 ug/min via INTRAVENOUS
  Filled 2020-06-15: qty 250

## 2020-06-15 MED ORDER — SODIUM CHLORIDE 0.9 % IV SOLN
INTRAVENOUS | Status: AC | PRN
  Administered 2020-06-15: 1000 mL via INTRAVENOUS

## 2020-06-15 MED ORDER — EPINEPHRINE 1 MG/10ML IJ SOSY
PREFILLED_SYRINGE | INTRAMUSCULAR | Status: AC | PRN
  Administered 2020-06-15 (×2): 1 via INTRAVENOUS
  Administered 2020-06-15: 1 mg via INTRAVENOUS

## 2020-06-15 MED ORDER — FENTANYL CITRATE (PF) 100 MCG/2ML IJ SOLN: 50.0000 ug | Freq: Once | INTRAMUSCULAR | Status: AC

## 2020-06-15 MED ORDER — FENTANYL CITRATE (PF) 100 MCG/2ML IJ SOLN
INTRAMUSCULAR | Status: AC
  Administered 2020-06-15: 21:00:00 50 ug via INTRAVENOUS
  Filled 2020-06-15: qty 2

## 2020-06-15 MED ORDER — NOREPINEPHRINE 4 MG/250ML-% IV SOLN
0.0000 ug/min | INTRAVENOUS | Status: DC
  Administered 2020-06-15: 22:00:00 15 ug/min via INTRAVENOUS

## 2020-06-15 MED ORDER — FENTANYL CITRATE (PF) 100 MCG/2ML IJ SOLN
INTRAMUSCULAR | Status: AC
  Administered 2020-06-15: 21:00:00 50 ug
  Filled 2020-06-15: qty 2

## 2020-06-15 MED ORDER — EPINEPHRINE 1 MG/10ML IJ SOSY
1.0000 | PREFILLED_SYRINGE | Freq: Once | INTRAMUSCULAR | Status: AC
  Administered 2020-06-15: 21:00:00 1 mg via INTRAVENOUS

## 2020-06-15 MED ORDER — NOREPINEPHRINE 4 MG/250ML-% IV SOLN
INTRAVENOUS | Status: AC
  Administered 2020-06-16: 40 ug/min via INTRAVENOUS
  Filled 2020-06-15: qty 250

## 2020-06-15 MED ORDER — SODIUM CHLORIDE 0.9 % IV SOLN
2.0000 g | Freq: Once | INTRAVENOUS | Status: AC
  Administered 2020-06-15: 21:00:00 2 g via INTRAVENOUS
  Filled 2020-06-15: qty 2

## 2020-06-15 MED ORDER — SODIUM CHLORIDE 0.9 % IV BOLUS
1000.0000 mL | Freq: Once | INTRAVENOUS | Status: AC
  Administered 2020-06-15: 20:00:00 1000 mL via INTRAVENOUS

## 2020-06-15 MED ORDER — ATROPINE SULFATE 1 MG/10ML IJ SOSY
1.0000 mg | PREFILLED_SYRINGE | Freq: Once | INTRAMUSCULAR | Status: AC
  Administered 2020-06-15: 21:00:00 1 mg via INTRAVENOUS

## 2020-06-15 NOTE — Sepsis Progress Note (Signed)
Monitoring for sepsis protocol. °

## 2020-06-15 NOTE — Sepsis Progress Note (Signed)
Notified bedside nurse of need to draw repeat lactic acid. 

## 2020-06-15 NOTE — ED Triage Notes (Signed)
Patient reports to the ER for SOB and AMS. Patient is reportedly a chemo patient and has been acting more confused per daughter.

## 2020-06-15 NOTE — Code Documentation (Signed)
ROSC achieved 

## 2020-06-15 NOTE — Sepsis Progress Note (Signed)
Notified bedside nurse of need to draw and administer antibiotics and repeat lactic acid.

## 2020-06-15 NOTE — Progress Notes (Signed)
A consult was received from an ED physician for Cefepime and Vancomycin per pharmacy dosing.  The patient's profile has been reviewed for ht/wt/allergies/indication/available labs.   A one time order has been placed for Cefepime 2g IV and Vancomycin 1g IV .  Further antibiotics/pharmacy consults should be ordered by admitting physician if indicated.                       Thank you,  Lynann Beaver PharmD, BCPS Clinical Pharmacist [facility] main pharmacy [phone] 2020-07-03 7:59 PM

## 2020-06-15 NOTE — ED Notes (Signed)
Called critical value of lactic >11.0 to [place], [place]

## 2020-06-15 NOTE — ED Notes (Signed)
Pt ao x 4 pt, sitting up on side of bed  c/o left side / back pain . Wife at beside

## 2020-06-15 NOTE — ED Provider Notes (Signed)
[facility] DEPT Provider Note   CSN: [id number] Arrival date & time: 06/01/2020  1809     History Chief Complaint  Patient presents with  . Shortness of Breath  . Cancer    RAMCES SHOMAKER is a 59 y.o. male w/ hx of HIV, DM2, hepatitis, cholangiocarcinoma, CAD s/p stent, presenting to ED with confusion, weakness.  Patient was seen yesterday in the ED for weakness and abdominal pain, treated for hyperglycemia and discharged home.  He returns today with his wife with concern for worsening abdominal pain, poor appetite, and confusion.    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  He has a perc chole drain and is scheduled for port placement NEXT WEEK to begin chemotherapy.  05/20/2020 Imaging   MRI with MRCP 1. Masslike area in the central liver obstructing bile ducts, common hepatic duct and narrowing the portal vein extending from the gallbladder fossa a highly suspicious for aggressive biliary neoplasm/cholangiocarcinoma/gallbladder carcinoma. Superimposed infection is also possible. 2. Pancreas is largely normal but inseparable from enhancing soft tissue that extends from the porta hepatis inferiorly and with irregular enhancement that surrounds an area of presumed necrotic tumor and or infected necrotic debris. 3. Separate smaller area showing heterogeneous enhancement likely reflects additional site of tumor in the liver in the inferior medial LEFT hepatic lobe. 4. Small volume ascites about the liver. 5. Biliary drainage tube appears to remain in place, not well evaluated except on MRCP sequences were a can be faintly seen     HPI     Past Medical History:  Diagnosis Date  . Anemia   . Anemia due to medication 09/24/2015  . Arthritis    "feet" (11/19/2015)  . CAD (coronary artery disease), native coronary artery    a. remote LAD stent 1990s. b. s/p DESx2 to LAD 11/2015  . Hepatitis A immune 11/20/2016  . Hepatitis B  immune 11/20/2016  . Hepatitis C    "going thru the treatments" (11/19/2015)  . High cholesterol   . History of gout    "just recently" (11/19/2015)  . HIV (human immunodeficiency virus infection) ([place])   . Hypertension   . Posterior pain of left hip 03/01/2018  . Type II diabetes mellitus ([place])    diagnosed 04-2015    Patient Active Problem List   Diagnosis Date Noted  . Uncontrolled diabetes mellitus ([place]) 06/09/2020  . Normocytic anemia 06/01/2020  . Hepatobiliary cancer ([place]) 06/01/2020  . Goals of care, counseling/discussion 06/01/2020  . Aortic atherosclerosis ([place]) 05/27/2020  . Biliary tract neoplasm 05/27/2020  . Elevated liver enzymes   . Elevated alkaline phosphatase level   . Loss of weight   . Protein-calorie malnutrition, severe 05/20/2020  . Painless jaundice 05/19/2020  . Hypercalcemia of malignancy 03/30/2020  . Pre-syncope 03/19/2020  . CKD (chronic kidney disease) stage 3, GFR 30-59 ml/min (HCC) 03/16/2020  . Acalculous cholecystitis 03/03/2020  . Gluteal pain (left) 04/16/2019  . Contact dermatitis 01/08/2019  . Rash 10/29/2018  . Chronic hepatitis C without hepatic coma ([place]) 08/21/2018  . Cataracts, bilateral 05/21/2017  . S/P angioplasty with stent, 11/19/15 DES X 2 mLAD and dLAD, normal EF  11/20/2015  . Coronary artery disease involving native coronary artery of native heart   . Exertional angina (HCC) 11/17/2015  . Gout 10/29/2015  . Type 2 diabetes mellitus with circulatory disorder causing erectile dysfunction ([place]) 05/11/2015  . Preventative health care 05/11/2015  . Hyperlipemia 05/11/2015  . Arthritis of shoulder  region, left 03/24/2013  . Hypertriglyceridemia 03/24/2013  . ERECTILE DYSFUNCTION 09/12/2006  . Human immunodeficiency virus (HIV) disease ([place]) 08/09/2006  . Essential hypertension 08/09/2006    Past Surgical History:  Procedure Laterality Date  . BILIARY BRUSHING  05/22/2020   Procedure: BILIARY BRUSHING;  Surgeon: Rush Landmark  Telford Nab., MD;  Location: [facility];  Service: Gastroenterology;;  . BILIARY DILATION  05/22/2020   Procedure: BILIARY DILATION;  Surgeon: Irving Copas., MD;  Location: [facility];  Service: Gastroenterology;;  . BILIARY STENT PLACEMENT  05/22/2020   Procedure: BILIARY STENT PLACEMENT;  Surgeon: Irving Copas., MD;  Location: [facility];  Service: Gastroenterology;;  . BIOPSY  05/22/2020   Procedure: BIOPSY;  Surgeon: Irving Copas., MD;  Location: [facility];  Service: Gastroenterology;;  . CARDIAC CATHETERIZATION N/A 11/19/2015   Procedure: Left Heart Cath and Coronary Angiography;  Surgeon: Jolaine Artist, MD;  Location: [facility];  Service: Cardiovascular;  Laterality: N/A;  . CARDIAC CATHETERIZATION N/A 11/19/2015   Procedure: Coronary Stent Intervention;  Surgeon: Jettie Booze, MD;  Location: [facility];  Service: Cardiovascular;  Laterality: N/A;  . CORONARY ANGIOPLASTY WITH STENT PLACEMENT  11/19/2015   "2 stents"  . ENDOSCOPIC RETROGRADE CHOLANGIOPANCREATOGRAPHY (ERCP) WITH PROPOFOL N/A 05/22/2020   Procedure: ENDOSCOPIC RETROGRADE CHOLANGIOPANCREATOGRAPHY (ERCP) WITH PROPOFOL;  Surgeon: Rush Landmark Telford Nab., MD;  Location: [facility];  Service: Gastroenterology;  Laterality: N/A;  . ESOPHAGOGASTRODUODENOSCOPY (EGD) WITH PROPOFOL N/A 05/22/2020   Procedure: ESOPHAGOGASTRODUODENOSCOPY (EGD) WITH PROPOFOL;  Surgeon: Rush Landmark Telford Nab., MD;  Location: [facility];  Service: Gastroenterology;  Laterality: N/A;  . IR CHOLANGIOGRAM EXISTING TUBE  03/23/2020  . IR EXCHANGE BILIARY DRAIN  04/22/2020  . IR EXCHANGE BILIARY DRAIN  06/10/2020  . IR PERC CHOLECYSTOSTOMY  03/06/2020  . IR RADIOLOGIST EVAL & MGMT  04/06/2020  . IR RADIOLOGIST EVAL & MGMT  04/20/2020  . other     unknown abdominal surgery in childhood  . REMOVAL OF STONES  05/22/2020   Procedure: REMOVAL OF STONES;  Surgeon: Rush Landmark Telford Nab., MD;  Location: [facility];  Service: Gastroenterology;;  . Joan Mayans  05/22/2020   Procedure: Joan Mayans;  Surgeon: Irving Copas., MD;  Location: [facility];  Service: Gastroenterology;;  . UPPER ESOPHAGEAL ENDOSCOPIC ULTRASOUND (EUS) N/A 05/22/2020   Procedure: UPPER ESOPHAGEAL ENDOSCOPIC ULTRASOUND (EUS);  Surgeon: Irving Copas., MD;  Location: [facility];  Service: Gastroenterology;  Laterality: N/A;       Family History  Problem Relation Age of Onset  . Diabetes Mother   . Cancer Mother        unkown type  . Hypertension Father   . Colon cancer Neg Hx     Social History   Tobacco Use  . Smoking status: Former Smoker    Packs/day: 0.12    Years: 49.00    Pack years: 5.88    Types: Cigarettes    Start date: 07/20/2012    Quit date: 02/18/2015    Years since quitting: 5.3  . Smokeless tobacco: Never Used  Vaping Use  . Vaping Use: Never used  Substance Use Topics  . Alcohol use: Not Currently    Alcohol/week: 6.0 standard drinks    Types: 6 Standard drinks or equivalent per week  . Drug use: No    Home Medications Prior to Admission medications   Medication Sig Start Date End Date Taking? Authorizing Provider  Accu-Chek Softclix Lancets lancets Check blood sugars 2 times a day while on steroids  05/28/20   Jean Rosenthal, MD  allopurinol (ZYLOPRIM) 100 MG tablet Take 1.5 tablets (150 mg total) by mouth daily. 06/06/2020   Mosetta Anis, MD  amLODipine (NORVASC) 10 MG tablet Take 1 tablet (10 mg total) by mouth daily. 11/03/19   Axel Filler, MD  aspirin EC 81 MG tablet Take 1 tablet (81 mg total) by mouth daily. 11/17/15   Bensimhon, Shaune Pascal, MD  BIKTARVY 50-200-25 MG TABS tablet TAKE 1 TABLET BY MOUTH DAILY Patient taking differently: Take 1 tablet by mouth daily. 05/17/20   Campbell Riches, MD  blood glucose meter kit and supplies KIT Dispense based on patient and insurance preference. Use up to four times daily as directed. (FOR ICD-9 250.00,  250.01). 08/03/15   Emokpae, Ejiroghene E, MD  Blood Glucose Monitoring Suppl (ACCU-CHEK GUIDE) w/Device KIT Check blood sugar 2 times a day while on steroids 05/28/20   Agyei, Caprice Kluver, MD  cholecalciferol (VITAMIN D) 25 MCG tablet Take 1 tablet (1,000 Units total) by mouth daily. 05/27/20 07/26/20  Lacinda Axon, MD  Continuous Blood Gluc Receiver (FREESTYLE LIBRE 2 READER SYSTM) DEVI 1 Device by Does not apply route 3 (three) times daily. 01/08/19   Jean Rosenthal, MD  Continuous Blood Gluc Sensor (FREESTYLE LIBRE 2 SENSOR SYSTM) MISC 1 Device by Does not apply route 3 (three) times daily. 01/08/19   Agyei, Caprice Kluver, MD  dexamethasone (DECADRON) 4 MG tablet Take 4 mg by mouth See admin instructions. Take 1 tablet two times daily with meals for 14 days.    [provider]  glucose blood (ACCU-CHEK GUIDE) test strip Check blood sugar 2 times per day while on steroids 05/28/20   Agyei, Caprice Kluver, MD  losartan (COZAAR) 50 MG tablet Take 1 tablet (50 mg total) by mouth daily. 03/30/20   Mosetta Anis, MD  metFORMIN (GLUCOPHAGE) 1000 MG tablet Take 1 tablet (1,000 mg total) by mouth 2 (two) times daily with a meal. 06/09/20   Agyei, Caprice Kluver, MD  metoprolol succinate (TOPROL-XL) 50 MG 24 hr tablet TAKE 1 TABLET BY MOUTH DAILY WITH OR IMMEDAITELY FOLLOWING A MEAL Patient taking differently: Take 50 mg by mouth daily. TAKE 1 TABLET BY MOUTH DAILY WITH OR IMMEDAITELY FOLLOWING A MEAL 06/03/20   Agyei, Caprice Kluver, MD  Multiple Vitamin (MULTIVITAMIN WITH MINERALS) TABS tablet Take 1 tablet by mouth daily.    [provider]  nitroGLYCERIN (NITROSTAT) 0.4 MG SL tablet Place 1 tablet (0.4 mg total) under the tongue every 5 (five) minutes as needed for chest pain. 11/17/15   Bensimhon, Shaune Pascal, MD  ondansetron (ZOFRAN) 8 MG tablet Take 1 tablet (8 mg total) by mouth every 8 (eight) hours as needed for refractory nausea / vomiting. Start on day 3 after chemotherapy. 06/01/20   [name], MD  [facility] DELICA  LANCETS FINE MISC Check blood sugar up to 1 time a day 12/22/15   Collier Salina, MD  polyethylene glycol powder (GLYCOLAX/MIRALAX) 17 GM/SCOOP powder Take 17 g by mouth daily.    [provider]  prochlorperazine (COMPAZINE) 10 MG tablet Take 1 tablet (10 mg total) by mouth every 6 (six) hours as needed (Nausea or vomiting). 06/01/20   [name], MD  sodium chloride, PF, 0.9 % injection 5 mLs See admin instructions. 5 ml's into drain once a day 05/25/20   [provider]    Allergies    Patient has no known allergies.  Review of Systems  Review of Systems  Constitutional: Positive for appetite change, chills, fatigue and fever.  HENT: Negative for ear pain and sore throat.   Eyes: Negative for pain and visual disturbance.  Respiratory: Positive for shortness of breath. Negative for cough.   Cardiovascular: Negative for chest pain and palpitations.  Gastrointestinal: Positive for abdominal pain and nausea. Negative for vomiting.  Musculoskeletal: Negative for arthralgias and myalgias.  Skin: Negative for color change and rash.  Neurological: Negative for syncope and headaches.  All other systems reviewed and are negative.   Physical Exam Updated Vital Signs BP (!) 86/68   Pulse (!) 109   Temp (!) 97.4 F (36.3 C)   Resp (!) 22   Ht 5' 5" (1.651 m)   SpO2 90%   BMI 25.06 kg/m   Physical Exam Vitals and nursing note reviewed.  Constitutional:      Appearance: He is well-developed and well-nourished.  HENT:     Head: Normocephalic and atraumatic.  Eyes:     Conjunctiva/sclera: Conjunctivae normal.  Cardiovascular:     Rate and Rhythm: Normal rate and regular rhythm.     Heart sounds: No murmur heard.   Pulmonary:     Effort: Pulmonary effort is normal. No respiratory distress.     Breath sounds: Normal breath sounds.  Abdominal:     General: There is distension.     Tenderness: There is no guarding.  Musculoskeletal:        General: No edema.      Cervical back: Neck supple.  Skin:    General: Skin is warm and dry.  Neurological:     Mental Status: He is alert.  Psychiatric:        Mood and Affect: Mood and affect normal.     ED Results / Procedures / Treatments   Labs (all labs ordered are listed, but only abnormal results are displayed) Labs Reviewed  LACTIC ACID, PLASMA - Abnormal; Notable for the following components:      Result Value   Lactic Acid, Venous >11.0 (*)    All other components within normal limits  LACTIC ACID, PLASMA - Abnormal; Notable for the following components:   Lactic Acid, Venous >11.0 (*)    All other components within normal limits  COMPREHENSIVE METABOLIC PANEL - Abnormal; Notable for the following components:   Sodium 134 (*)    Potassium 5.2 (*)    Chloride 96 (*)    CO2 7 (*)    Glucose, Bld 212 (*)    BUN 57 (*)    Creatinine, Ser 3.01 (*)    Albumin 2.6 (*)    AST 3,784 (*)    ALT 2,134 (*)    Alkaline Phosphatase 897 (*)    Total Bilirubin 2.8 (*)    GFR, Estimated 23 (*)    Anion gap 31 (*)    All other components within normal limits  CBC WITH DIFFERENTIAL/PLATELET - Abnormal; Notable for the following components:   WBC 23.1 (*)    RBC 3.28 (*)    Hemoglobin 9.9 (*)    HCT 34.0 (*)    MCV 103.7 (*)    MCHC 29.1 (*)    RDW 17.8 (*)    nRBC 1.9 (*)    Neutro Abs 19.1 (*)    Abs Immature Granulocytes 0.37 (*)    All other components within normal limits  LIPASE, BLOOD - Abnormal; Notable for the following components:   Lipase 115 (*)    All other components  within normal limits  PROTIME-INR - Abnormal; Notable for the following components:   Prothrombin Time 19.9 (*)    INR 1.8 (*)    All other components within normal limits  APTT - Abnormal; Notable for the following components:   aPTT 56 (*)    All other components within normal limits  BLOOD GAS, ARTERIAL - Abnormal; Notable for the following components:   pH, Arterial 6.819 (*)    pO2, Arterial 149 (*)     Bicarbonate 7.1 (*)    Acid-base deficit 24.9 (*)    All other components within normal limits  CBG MONITORING, ED - Abnormal; Notable for the following components:   Glucose-Capillary 145 (*)    All other components within normal limits  TROPONIN I (HIGH SENSITIVITY) - Abnormal; Notable for the following components:   Troponin I (High Sensitivity) 95 (*)    All other components within normal limits  TROPONIN I (HIGH SENSITIVITY) - Abnormal; Notable for the following components:   Troponin I (High Sensitivity) 153 (*)    All other components within normal limits  RESP PANEL BY RT-PCR (FLU A&B, COVID) ARPGX2  CULTURE, BLOOD (SINGLE)  CULTURE, BLOOD (SINGLE)  URINALYSIS, ROUTINE W REFLEX MICROSCOPIC  AMMONIA  BLOOD GAS, ARTERIAL    EKG None  Radiology DG Chest 2 View  Result Date: 06/12/2020 CLINICAL DATA:  Shortness of breath EXAM: CHEST - 2 VIEW COMPARISON:  09/13/2005, CT 05/27/2020 FINDINGS: No focal opacity or pleural effusion. Normal cardiomediastinal silhouette. Abnormal lucency in the right upper quadrant with slight branching configuration. IMPRESSION: 1. No active cardiopulmonary disease. 2. Branching lucencies in the right upper quadrant are indeterminate for portal venous versus pneumobilia (known biliary drains/stents); consider CT for further evaluation Critical Value/emergent results were called by telephone at the time of interpretation on 05/20/2020 at 7:03 pm to provider MATTHEW TRIFAN , who verbally acknowledged these results. Electronically Signed   By: Donavan Foil M.D.   On: 06/06/2020 19:03   DG Chest Portable 1 View  Result Date: 05/25/2020 CLINICAL DATA:  Intubation and central line placement. EXAM: PORTABLE CHEST 1 VIEW COMPARISON:  Radiograph earlier today. FINDINGS: Endotracheal tube tip 5.8 cm from the carina at the thoracic inlet. Right internal jugular central venous catheter tip in the mid SVC. No pneumothorax. Low lung volumes with upper normal heart  size. No large pleural effusion. Developing vascular congestion. Stable branching lucencies in the right upper quadrant. IMPRESSION: 1. Endotracheal tube tip 5.8 cm from the carina at the thoracic inlet. 2. Right internal jugular central venous catheter tip in the mid SVC. No pneumothorax. 3. Low lung volumes with developing vascular congestion. Electronically Signed   By: Keith Rake M.D.   On: 05/30/2020 21:25    Procedures .Critical Care Performed by: Wyvonnia Dusky, MD Authorized by: Wyvonnia Dusky, MD   Critical care provider statement:    Critical care time (minutes):  21   Critical care was necessary to treat or prevent imminent or life-threatening deterioration of the following conditions:  Shock and circulatory failure   Critical care was time spent personally by me on the following activities:  Discussions with consultants, evaluation of patient's response to treatment, examination of patient, ordering and performing treatments and interventions, ordering and review of laboratory studies, ordering and review of radiographic studies, pulse oximetry, re-evaluation of patient's condition, obtaining history from patient or surrogate and review of old charts Procedure Name: Intubation Date/Time: 05/19/2020 11:29 PM Performed by: Wyvonnia Dusky, MD Pre-anesthesia Checklist: Emergency  Drugs available and Suction available Laryngoscope Size: Mac and 3 Tube size: 7.5 mm Number of attempts: 1 Airway Equipment and Method: Patient positioned with wedge pillow and Stylet Placement Confirmation: ETT inserted through vocal cords under direct vision,  Positive ETCO2,  Breath sounds checked- equal and bilateral and CO2 detector Secured at: 25 cm Tube secured with: ETT holder Comments: Emergent intubation    .Central Line  Date/Time: 05/22/2020 11:46 PM Performed by: Wyvonnia Dusky, MD Authorized by: Wyvonnia Dusky, MD   Consent:    Consent obtained:  Emergent  situation Universal protocol:    Site/side marked: yes     Patient identity confirmed:  Arm band Pre-procedure details:    Indication(s): central venous access     Hand hygiene: Hand hygiene performed prior to insertion     Sterile barrier technique: All elements of maximal sterile technique followed     Skin preparation:  Chlorhexidine   Skin preparation agent: Skin preparation agent completely dried prior to procedure   Sedation:    Sedation type:  Moderate sedation Anesthesia:    Anesthesia method:  None Procedure details:    Location:  R internal jugular   Patient position:  Supine   Procedural supplies:  Triple lumen   Landmarks identified: yes     Ultrasound guidance: yes     Ultrasound guidance timing: real time     Sterile ultrasound techniques: Sterile gel and sterile probe covers were used     Number of attempts:  1   Successful placement: yes   Post-procedure details:    Post-procedure:  Dressing applied and line sutured   Assessment:  Blood return through all ports, no pneumothorax on x-ray, placement verified by x-ray and free fluid flow   (including critical care time)  Medications Ordered in ED Medications  metroNIDAZOLE (FLAGYL) tablet 500 mg (500 mg Oral Not Given 05/24/2020 2123)  0.9 %  sodium chloride infusion (250 mLs Intravenous New Bag/Given 05/20/2020 2218)  norepinephrine (LEVOPHED) 4-5 MG/250ML-% infusion SOLN (has no administration in time range)  fentaNYL (SUBLIMAZE) 100 MCG/2ML injection (has no administration in time range)  fentaNYL 2535mg in NS 2586m(1059mml) infusion-PREMIX (has no administration in time range)  norepinephrine (LEVOPHED) 4mg68m 250mL57mmix infusion (15 mcg/min Intravenous New Bag/Given 05/31/2020 2137)  EPINEPHrine (ADRENALIN) 4 mg in NS 250 mL (0.016 mg/mL) premix infusion (0.5 mcg/min Intravenous New Bag/Given 06/14/2020 2244)  sodium bicarbonate 150 mEq in sterile water 1,000 mL infusion ( Intravenous New Bag/Given 05/24/2020 2248)   sodium chloride 0.9 % bolus 1,000 mL (0 mLs Intravenous Stopped 06/07/2020 2048)  HYDROmorphone (DILAUDID) injection 1 mg (1 mg Intravenous Given 06/11/2020 1942)  ceFEPIme (MAXIPIME) 2 g in sodium chloride 0.9 % 100 mL IVPB (0 g Intravenous Stopped 06/11/2020 2154)  vancomycin (VANCOCIN) IVPB 1000 mg/200 mL premix (1,000 mg Intravenous New Bag/Given 06/17/2020 2200)  sodium chloride 0.9 % bolus 1,000 mL (0 mLs Intravenous Stopped 06/13/2020 2216)  midazolam (VERSED) 2 MG/2ML injection (  Given 06/14/2020 2040)  fentaNYL (SUBLIMAZE) injection 50 mcg (50 mcg Intravenous Given 05/30/2020 2039)  EPINEPHrine (ADRENALIN) 1 MG/10ML injection (1 Syringe Intravenous Given 06/02/2020 2022)  atropine injection (1 mg Intravenous Given 05/19/2020 2015)  0.9 %  sodium chloride infusion (0 mLs Intravenous Stopped 05/24/2020 2216)  atropine 1 MG/10ML injection 1 mg (1 mg Intravenous Given 06/14/2020 2057)  EPINEPHrine (ADRENALIN) 1 MG/10ML injection 1 mg (1 mg Intravenous Given 06/18/2020 2100)  sodium bicarbonate injection 50 mEq (50 mEq Intravenous Given 06/17/2020 2101)  sodium bicarbonate injection 50 mEq (50 mEq Intravenous Given 06/01/2020 2102)    ED Course  I have reviewed the triage vital signs and the nursing notes.  Pertinent labs & imaging results that were available during my care of the patient were reviewed by me and considered in my medical decision making (see chart for details).  This patient complains of worsening abdominal pain, confusion. This involves an extensive number of treatment options, and is a complaint that carries with it a high risk of complications and morbidity.  The differential diagnosis includes biliary obstruction vs hyperglycemia/DKA/HHS vs other infection   Patient here with critical illness.  Originally had good BP on arrival, verbalizing, but rapidly became hypotensive and went into shock.  Suspected septic shock with intraabdominal source.  Found to have shock liver, transminitis, AKI.  See  ED course below.  Wife Olivia Mackie (sp?) at bedside throughout treatment course.  She confirms FULL CODE status for now.    I ordered, reviewed, and interpreted labs, which included Lactate > 11, WBC 23.1, Hgb 9.9, Lipase 115, transminitis, Cr 3.01 Medications ordered below I ordered imaging studies which included DG chest confirming ET placement and CVC.  Likely aspiration event following bagging and CPR. Previous records obtained and reviewed showing recent Ed visit and oncology notes   Clinical Course as of 07-09-20 1026  Tue Jun 15, 2020  2005 BP with MAP 59, 54'T systolic now.  Patient still awake.  Nurse at bedside establishing second IV.  He's only received 200 cc IV fluids yet, we'll run 2liters in wide open.  Peripheral levophed also ordered to initiate if needed to get MAP > 65.  Wife updated at bedside. [MT]  2005 Primary concern is severe sepsis or shock with liver failure or biliary obstruction.  AKI with Cr doubling. [MT]  2056 Pt went into shock with BP 62'B systolic, unresponsive, agonal breathing.  No palpable pulse.  CPR immediately initiated.  3 rounds of epinephrine given, with ROSC, HR 120 and sinus rhythm, BP then stablized with peripheral levophed.  Patient intubated emergently during CPR, now on ventilator.  Wife was present at bedside throughout, and confirmed he is FULL CODE.  ICU team paged now for admission. [MT]  2057 Antibiotics hanging, ordered wide open fluid bolus, IV fentanyl infusion [MT]  2059 Pt became bradycardic and lost pulses, CPR done for 2 minutes with 1 code epi given, 2 amps of sodium bicarb.  Subsequent ROSC with BP 638 systolic, HR 937 now.   [MT]  2109 Levophed at 10 mcg, switched to central line dosing now. [MT]  2153 ICU repaged [MT]  2159 MAP now 33, on levophed at 25 mcg/min, continuing to titrate up.  2nd liter IVF completing, 3rd ordered. [MT]  2229 Map 75 at 35 mcg/min of levophed, epinephrine ordered as secondary pressor.  ABG showing severe  acidosis.  Sodium bicarbonate infusion started, RR increased from 18 to 22 BPM [MT]  2235 Sodium bicarb ordered [MT]  2242 Contacted ICU team who is currently in an intubation with a critical patient - they are aware of patient and will come evaluate as soon as possible.  Requested we address acidosis with sodium bicarb, attempt CT scan of abdomen.  I advised nurse to await epi infusion prior to transport for CT [MT]  2340 Pt on way to CT now [MT]  Wed 07/09/2020  0005 Patient became bradycardic and lost pulses at CT scanner, after completing scans.  2 more amps of NaHCO2 given.  Bicarb infusion in free water now running.  1 round epi given, with ROSC.  I had another discussion with his wife and explained that I do not think he will survive this night.   She has agreed to a limited CPR resuscitation effort if he loses pulses, with limited rounds of CPR and epi.  She understands we've exhausted all other means of medical therapy, and the prognosis is extremely poor.   [MT]    Clinical Course User Index [MT] Khadeja Abt, Carola Rhine, MD   Patient signed out to ICU attending at 11:30 PM  Final Clinical Impression(s) / ED Diagnoses Final diagnoses:  Septic shock ([place])  Cardiac arrest ([place])  Acute kidney injury ([place])  Acute liver failure without hepatic coma    Rx / DC Orders ED Discharge Orders    None       Wyvonnia Dusky, MD July 14, 2020 1026

## 2020-06-15 NOTE — ED Notes (Signed)
14 fr foley inserted, terile technique uses small amount of urine returned. 10 ml sterile water used to inflate balloon. Securing device placed on left thigh

## 2020-06-15 NOTE — Progress Notes (Signed)
Abg obtained on the following vent settings: PRVC mode, VT 490 (57ml), rr18, 100%, +5peep:  Results for John Dickerson, John Dickerson (MRN [id number]) as of 05/30/2020 22:37  Ref. Range 06/09/2020 22:10  FIO2 Unknown 100.00  pH, Arterial Latest Ref Range: 7.350 - 7.450  6.819 (LL)  pCO2 arterial Latest Ref Range: 32.0 - 48.0 mmHg 45.7  pO2, Arterial Latest Ref Range: 83.0 - 108.0 mmHg 149 (H)  Acid-base deficit Latest Ref Range: 0.0 - 2.0 mmol/L 24.9 (H)  Bicarbonate Latest Ref Range: 20.0 - 28.0 mmol/L 7.1 (L)  O2 Saturation Latest Units: % 96.1  Patient temperature Unknown 97.4  Allens test (pass/fail) Latest Ref Range: PASS  PASS   Per MD, rate increased to 22 and fio2 weaned down to 70% and RN will be starting bicarb drip.

## 2020-06-15 NOTE — ED Notes (Addendum)
Trifan MD notified of Trop. Of  153

## 2020-06-15 NOTE — Progress Notes (Addendum)
CXR shows ETT 5.8cm above the carina.  Per Dr Renaye Rakers, ok to advance ett 2cm.  With RN's assistance, ETT advanced 2cm from 23cm lip to 25cm lip without incident.  Pt tolerated well.  Equal, bilateral breath sounds noted.

## 2020-06-16 DIAGNOSIS — Z87891 Personal history of nicotine dependence: Secondary | ICD-10-CM | POA: Diagnosis not present

## 2020-06-16 DIAGNOSIS — C249 Malignant neoplasm of biliary tract, unspecified: Secondary | ICD-10-CM | POA: Diagnosis present

## 2020-06-16 DIAGNOSIS — I469 Cardiac arrest, cause unspecified: Secondary | ICD-10-CM

## 2020-06-16 DIAGNOSIS — E1122 Type 2 diabetes mellitus with diabetic chronic kidney disease: Secondary | ICD-10-CM | POA: Diagnosis present

## 2020-06-16 DIAGNOSIS — N183 Chronic kidney disease, stage 3 unspecified: Secondary | ICD-10-CM | POA: Diagnosis present

## 2020-06-16 DIAGNOSIS — R6521 Severe sepsis with septic shock: Secondary | ICD-10-CM

## 2020-06-16 DIAGNOSIS — J9601 Acute respiratory failure with hypoxia: Secondary | ICD-10-CM | POA: Diagnosis present

## 2020-06-16 DIAGNOSIS — I129 Hypertensive chronic kidney disease with stage 1 through stage 4 chronic kidney disease, or unspecified chronic kidney disease: Secondary | ICD-10-CM | POA: Diagnosis present

## 2020-06-16 DIAGNOSIS — B20 Human immunodeficiency virus [HIV] disease: Secondary | ICD-10-CM | POA: Diagnosis present

## 2020-06-16 DIAGNOSIS — A419 Sepsis, unspecified organism: Secondary | ICD-10-CM | POA: Diagnosis present

## 2020-06-16 DIAGNOSIS — Z7984 Long term (current) use of oral hypoglycemic drugs: Secondary | ICD-10-CM | POA: Diagnosis not present

## 2020-06-16 DIAGNOSIS — R188 Other ascites: Secondary | ICD-10-CM | POA: Diagnosis present

## 2020-06-16 DIAGNOSIS — N179 Acute kidney failure, unspecified: Secondary | ICD-10-CM | POA: Diagnosis present

## 2020-06-16 DIAGNOSIS — Z8249 Family history of ischemic heart disease and other diseases of the circulatory system: Secondary | ICD-10-CM | POA: Diagnosis not present

## 2020-06-16 DIAGNOSIS — K7200 Acute and subacute hepatic failure without coma: Secondary | ICD-10-CM | POA: Diagnosis present

## 2020-06-16 DIAGNOSIS — Z79899 Other long term (current) drug therapy: Secondary | ICD-10-CM | POA: Diagnosis not present

## 2020-06-16 DIAGNOSIS — Z20822 Contact with and (suspected) exposure to covid-19: Secondary | ICD-10-CM | POA: Diagnosis present

## 2020-06-16 DIAGNOSIS — D631 Anemia in chronic kidney disease: Secondary | ICD-10-CM | POA: Diagnosis present

## 2020-06-16 DIAGNOSIS — Z515 Encounter for palliative care: Secondary | ICD-10-CM | POA: Diagnosis not present

## 2020-06-16 DIAGNOSIS — E1165 Type 2 diabetes mellitus with hyperglycemia: Secondary | ICD-10-CM | POA: Diagnosis present

## 2020-06-16 DIAGNOSIS — E785 Hyperlipidemia, unspecified: Secondary | ICD-10-CM | POA: Diagnosis present

## 2020-06-16 DIAGNOSIS — E872 Acidosis: Secondary | ICD-10-CM | POA: Diagnosis present

## 2020-06-16 DIAGNOSIS — Z833 Family history of diabetes mellitus: Secondary | ICD-10-CM | POA: Diagnosis not present

## 2020-06-16 DIAGNOSIS — Z66 Do not resuscitate: Secondary | ICD-10-CM | POA: Diagnosis not present

## 2020-06-16 LAB — BLOOD GAS, ARTERIAL
Acid-base deficit: 24 mmol/L — ABNORMAL HIGH (ref 0.0–2.0)
Bicarbonate: 7.3 mmol/L — ABNORMAL LOW (ref 20.0–28.0)
FIO2: 100
O2 Saturation: 94.7 %
Patient temperature: 98.6
pCO2 arterial: 41.2 mmHg (ref 32.0–48.0)
pH, Arterial: 6.882 — CL (ref 7.350–7.450)
pO2, Arterial: 124 mmHg — ABNORMAL HIGH (ref 83.0–108.0)

## 2020-06-16 LAB — BLOOD CULTURE ID PANEL (REFLEXED) - BCID2

## 2020-06-16 MED ORDER — POLYETHYLENE GLYCOL 3350 17 G PO PACK: 17.0000 g | PACK | Freq: Every day | ORAL | Status: DC | PRN

## 2020-06-16 MED ORDER — SODIUM BICARBONATE 8.4 % IV SOLN
50.0000 meq | Freq: Once | INTRAVENOUS | Status: AC
  Administered 2020-06-15: 50 meq via INTRAVENOUS

## 2020-06-16 MED ORDER — EPINEPHRINE 1 MG/10ML IJ SOSY
1.0000 | PREFILLED_SYRINGE | Freq: Once | INTRAMUSCULAR | Status: AC
  Administered 2020-06-15: 1 mg via INTRAVENOUS

## 2020-06-16 MED ORDER — PANTOPRAZOLE SODIUM 40 MG IV SOLR: 40.0000 mg | Freq: Every day | INTRAVENOUS | Status: DC

## 2020-06-16 MED ORDER — SODIUM CHLORIDE 0.9 % IV SOLN: 2.0000 g | INTRAVENOUS | Status: DC

## 2020-06-16 MED ORDER — DOCUSATE SODIUM 100 MG PO CAPS: 100.0000 mg | ORAL_CAPSULE | Freq: Two times a day (BID) | ORAL | Status: DC | PRN

## 2020-06-16 MED ORDER — FENTANYL CITRATE (PF) 100 MCG/2ML IJ SOLN: 50.0000 ug | Freq: Once | INTRAMUSCULAR | Status: AC

## 2020-06-16 MED ORDER — HEPARIN SODIUM (PORCINE) 5000 UNIT/ML IJ SOLN: 5000.0000 [IU] | Freq: Three times a day (TID) | INTRAMUSCULAR | Status: DC

## 2020-06-16 MED ORDER — SODIUM BICARBONATE 8.4 % IV SOLN: 200.0000 meq | Freq: Once | INTRAVENOUS | Status: DC

## 2020-06-16 MED FILL — Medication: Qty: 1 | Status: AC

## 2020-06-18 LAB — CULTURE, BLOOD (SINGLE): Special Requests: ADEQUATE

## 2020-06-19 ENCOUNTER — Other Ambulatory Visit: Payer: Self-pay | Admitting: Hematology and Oncology

## 2020-06-19 DIAGNOSIS — C249 Malignant neoplasm of biliary tract, unspecified: Secondary | ICD-10-CM

## 2020-06-19 DIAGNOSIS — Z7189 Other specified counseling: Secondary | ICD-10-CM

## 2020-06-19 NOTE — ED Notes (Addendum)
Pt c/o right side abd/ side pain . Pt states he just can not hey comfortable.

## 2020-06-19 NOTE — Progress Notes (Signed)
Chaplain responded to page for support of family at EOL.  Chaplain established relationship of care and concern for wife and niece at bedside.  Chaplain offered comfort and listened to stories of pt and the love he had for his wife.  When wife was ready to depart Chaplain prayed at bedside.   Vernell Morgans Chaplain

## 2020-06-19 NOTE — ED Notes (Signed)
Spoke with MD Erskine Squibb and she had spoke with the pt  Wife and they had agreed to withdrawal care . Just waiting on family and chaplin.

## 2020-06-19 NOTE — ED Notes (Signed)
Pt was in ct when he began to brady down. CPR started, code blue called

## 2020-06-19 NOTE — ED Notes (Addendum)
Wife also states that pt has been confused and never c/o pain

## 2020-06-19 NOTE — ED Notes (Signed)
ROSC was reached and pt moved back to his room.  MD in room talking with family

## 2020-06-19 NOTE — ED Notes (Signed)
Pt went into asystole , MD at bedside with family . All drips stopped.

## 2020-06-19 NOTE — H&P (Signed)
NAME:  John Dickerson, MRN:  [id number], DOB:  09/07/1960, LOS: 0 ADMISSION DATE:  05/31/2020, CONSULTATION DATE:  2020-07-04 REFERRING MD:  Octaviano Glow, MD CHIEF COMPLAINT:  Cardiac arrest, septic shock  Brief History:  John Dickerson is a 60 year old with hepatobiliary cancer who presented with weakness and confusion. He was seen in the ED on 12/27 for hyperglycemia and discharged home. Yesterday he returned to the ED for worsening abdominal pain and altered mental status. In the ED labs were significant for lactic acidosis. He developed bradycardia and PEA arrest. CPR performed x 3. PCCM consulted for admission.   Past Medical History:  Hepatobiliary cancer CKD stage III DM2  Significant Hospital Events:  12/28 Admit  Consults:  PCCM  Procedures:  ETT 12/28>  Significant Diagnostic Tests:  CT A/P 12/29 IMPRESSION: No acute intracranial abnormality.  Extensive paranasal sinus disease with probable chronic opacification of the sphenoid sinuses bilaterally.  Left lower lobe consolidation, possibly reflecting changes of acute lobar pneumonia in the appropriate clinical setting.  Mild interstitial pulmonary edema.  Extensive intraparenchymal gas and portal venous gas throughout the right hepatic lobe and liver, respectively. Superior mesenteric venous gas as well as trace gas within the right colic vein suggesting loss of integrity of the colonic wall within the hepatic flexure. Possible direct communication between the colon and right hepatic lobe in this region, possibly contributing to extensive gas throughout the right hepatic lobe and within the known central neoplasm within this region. Gallbladder drain is partially withdrawn with sideholes position within the right hepatic parenchyma. A ball-valve effect from aggressive flushing could also potentially result in extensive intraparenchymal gas within the right hepatic lobe and correlation for  appropriate drain function is recommended. Finally, it is conceivable that a ball valve mechanism through the existing internal biliary stents could contribute to accumulation of gas within the right hepatic parenchyma ultimately resulting in dissection of gas into the portal venous system, though this would not account for gas seen within the mesenteric venous system. Repeat examination with contrast enhancement, with the patient is clinically able to do so, may be more helpful to assess the integrity of the bowel wall and potential communication between and the colon and hepatic parenchyma.  Micro Data:  BCx 12/28>  Antimicrobials:  Vanc 12/28> Cefepime 12/28>  Interim History / Subjective:  As above  Objective   Blood pressure 125/88, pulse (!) 115, temperature (!) 97.4 F (36.3 C), resp. rate (!) 22, height _0  (1.651 m), SpO2 99 %.    Vent Mode: PRVC FiO2 (%):  [70 %-100 %] 100 % Set Rate:  [18 bmp-22 bmp] 22 bmp Vt Set:  [490 mL-550 mL] 490 mL PEEP:  [5 cmH20] 5 cmH20 Plateau Pressure:  [19 cmH20-27 cmH20] 19 cmH20  No intake or output data in the 24 hours ending July 04, 2020 0017 There were no vitals filed for this visit.  Physical Exam: General: Critically ill-appearing, unresponsive HENT: [place], AT, ETT in place Eyes: EOMI, no scleral icterus Respiratory: Anterior rhonchi bilaterally Cardiovascular: RRR, -M/R/G, no JVD GI: BS+, distended, firm Extremities:-Edema,-tenderness Neuro: [facility] Problem list     Assessment & Plan:   Cardiac arrest x 3 with ROSC Telemetry Echocardiogram Trend troponin  Septic shock with severe lactic acidosis Vasopressors for MAP goal >65. Epi, levopho Broad spectrum antibiotics: Flagyl, Cefepime F/u CT A/P Trend LA  Acute hypoxemic respiratory failure Full vent support. Maximize RR and TV with Pplat <30 for severe met acidosis ABG  PRN Hold SBT/WUA due to critical illness PAD protocol for goal RASS  -1 and 0  AKI with severe metabolic acidosis Sodium bicarb gtt Foley in place Monitor Cr/UOP  Hepatobiliary cancer Intraparenchymal and portal vein gas Elevated LFTs in setting of cardiac arrest Trend  GOC Discussed goals of care with wife at bedside including his critical condition. After three cardiac arrests, his prognosis is grim and is currently receiving aggressive life support. Decision was made to withdraw care. Chaplain called. Patient expired at 1:24 AM on 2020-06-23.  Labs   CBC: Recent Labs  Lab 06/09/20 1029 06/14/20 0912 06/14/20 1136 05/30/2020 1819  WBC 9.1 11.0* 12.2* 23.1*  NEUTROABS 7.7 9.7*  --  19.1*  HGB 9.0* 9.3* 9.2* 9.9*  HCT 28.4* 28.6* 27.8* 34.0*  MCV 93.4 90.8 91.7 103.7*  PLT 514* 264 269 725    Basic Metabolic Panel: Recent Labs  Lab 06/09/20 1029 06/14/20 0912 06/14/20 1136 06/18/2020 1819  NA 137 131* 132* 134*  K 4.6 5.0 4.5 5.2*  CL 99 95* 95* 96*  CO2 _0 7*  GLUCOSE 363* 522* 547* 212*  BUN 29* 37* 36* 57*  CREATININE 1.19 1.37* 1.21 3.01*  CALCIUM 10.3 10.5* 9.6 9.2   GFR: Estimated Creatinine Clearance: 22.4 mL/min (A) (by C-G formula based on SCr of 3.01 mg/dL (H)). Recent Labs  Lab 06/09/20 1029 06/14/20 0912 06/14/20 1136 06/14/2020 1819 06/04/2020 2019  WBC 9.1 11.0* 12.2* 23.1*  --   LATICACIDVEN  --   --   --  >11.0* >11.0*    Liver Function Tests: Recent Labs  Lab 06/09/20 1029 06/14/20 0912 06/09/2020 1819  AST 42* 40 3,784*  ALT 67* 65* 2,134*  ALKPHOS 415* 433* 897*  BILITOT 1.9* 1.4* 2.8*  PROT 7.7 7.3 6.7  ALBUMIN 2.5* 2.4* 2.6*   Recent Labs  Lab 06/09/2020 1819  LIPASE 115*   No results for input(s): AMMONIA in the last 168 hours.  ABG    Component Value Date/Time   PHART 6.819 (LL) 05/26/2020 2210   PCO2ART 45.7 05/25/2020 2210   PO2ART 149 (H) 05/20/2020 2210   HCO3 7.1 (L) 06/10/2020 2210   ACIDBASEDEF 24.9 (H) 06/01/2020 2210   O2SAT 96.1 06/05/2020 2210     Coagulation  Profile: Recent Labs  Lab 05/28/2020 1935  INR 1.8*    Cardiac Enzymes: No results for input(s): CKTOTAL, CKMB, CKMBINDEX, TROPONINI in the last 168 hours.  HbA1C: Hemoglobin A1C  Date/Time Value Ref Range Status  03/03/2020 08:57 AM 6.4 (A) 4.0 - 5.6 % Final  04/16/2019 02:59 PM 6.3 (A) 4.0 - 5.6 % Final   Hgb A1c MFr Bld  Date/Time Value Ref Range Status  11/20/2018 08:52 AM 6.2 (H) 4.8 - 5.6 % Final    Comment:             Prediabetes: 5.7 - 6.4          Diabetes: >6.4          Glycemic control for adults with diabetes: <7.0   04/26/2015 09:30 AM 13.4 (H) <5.7 % Final    Comment:  According to the ADA Clinical Practice Recommendations for 2011, when HbA1c is used as a screening test:     >=6.5%   Diagnostic of Diabetes Mellitus            (if abnormal result is confirmed)   5.7-6.4%   Increased risk of developing Diabetes Mellitus   References:Diagnosis and Classification of Diabetes Mellitus,Diabetes OJJK,0938,18(EXHBZ 1):S62-S69 and Standards of Medical Care in         Diabetes - 2011,Diabetes JIRC,7893,81 (Suppl 1):S11-S61.       CBG: Recent Labs  Lab 06/14/20 1315 06/14/20 1445 06/14/20 1607 06/14/20 1741 05/23/2020 2107  GLUCAP >600* 588* 507* 397* 145*    Review of Systems:   Unable to obtain due to intubated status  Past Medical History:  He,  has a past medical history of Anemia, Anemia due to medication (09/24/2015), Arthritis, CAD (coronary artery disease), native coronary artery, Hepatitis A immune (11/20/2016), Hepatitis B immune (11/20/2016), Hepatitis C, High cholesterol, History of gout, HIV (human immunodeficiency virus infection) ([place]), Hypertension, Posterior pain of left hip (03/01/2018), and Type II diabetes mellitus ([place]).   Surgical History:   Past Surgical History:  Procedure Laterality Date  . BILIARY BRUSHING  05/22/2020   Procedure: BILIARY BRUSHING;  Surgeon:  Rush Landmark Telford Nab., MD;  Location: [place];  Service: Gastroenterology;;  . BILIARY DILATION  05/22/2020   Procedure: BILIARY DILATION;  Surgeon: Irving Copas., MD;  Location: [place];  Service: Gastroenterology;;  . BILIARY STENT PLACEMENT  05/22/2020   Procedure: BILIARY STENT PLACEMENT;  Surgeon: Irving Copas., MD;  Location: [place];  Service: Gastroenterology;;  . BIOPSY  05/22/2020   Procedure: BIOPSY;  Surgeon: Irving Copas., MD;  Location: [place];  Service: Gastroenterology;;  . CARDIAC CATHETERIZATION N/A 11/19/2015   Procedure: Left Heart Cath and Coronary Angiography;  Surgeon: Jolaine Artist, MD;  Location: [place] [facility];  Service: Cardiovascular;  Laterality: N/A;  . CARDIAC CATHETERIZATION N/A 11/19/2015   Procedure: Coronary Stent Intervention;  Surgeon: Jettie Booze, MD;  Location: [place] [facility];  Service: Cardiovascular;  Laterality: N/A;  . CORONARY ANGIOPLASTY WITH STENT PLACEMENT  11/19/2015   "2 stents"  . ENDOSCOPIC RETROGRADE CHOLANGIOPANCREATOGRAPHY (ERCP) WITH PROPOFOL N/A 05/22/2020   Procedure: ENDOSCOPIC RETROGRADE CHOLANGIOPANCREATOGRAPHY (ERCP) WITH PROPOFOL;  Surgeon: Rush Landmark Telford Nab., MD;  Location: [place];  Service: Gastroenterology;  Laterality: N/A;  . ESOPHAGOGASTRODUODENOSCOPY (EGD) WITH PROPOFOL N/A 05/22/2020   Procedure: ESOPHAGOGASTRODUODENOSCOPY (EGD) WITH PROPOFOL;  Surgeon: Rush Landmark Telford Nab., MD;  Location: [place];  Service: Gastroenterology;  Laterality: N/A;  . IR CHOLANGIOGRAM EXISTING TUBE  03/23/2020  . IR EXCHANGE BILIARY DRAIN  04/22/2020  . IR EXCHANGE BILIARY DRAIN  06/10/2020  . IR PERC CHOLECYSTOSTOMY  03/06/2020  . IR RADIOLOGIST EVAL & MGMT  04/06/2020  . IR RADIOLOGIST EVAL & MGMT  04/20/2020  . other     unknown abdominal surgery in childhood  . REMOVAL OF STONES  05/22/2020   Procedure: REMOVAL OF STONES;  Surgeon: Rush Landmark Telford Nab., MD;   Location: [place];  Service: Gastroenterology;;  . Joan Mayans  05/22/2020   Procedure: Joan Mayans;  Surgeon: Irving Copas., MD;  Location: [place];  Service: Gastroenterology;;  . UPPER ESOPHAGEAL ENDOSCOPIC ULTRASOUND (EUS) N/A 05/22/2020   Procedure: UPPER ESOPHAGEAL ENDOSCOPIC ULTRASOUND (EUS);  Surgeon: Irving Copas., MD;  Location: [place];  Service: Gastroenterology;  Laterality: N/A;     Social History:   reports that he quit smoking about 5 years  ago. His smoking use included cigarettes. He started smoking about 7 years ago. He has a 5.88 pack-year smoking history. He has never used smokeless tobacco. He reports previous alcohol use of about 6.0 standard drinks of alcohol per week. He reports that he does not use drugs.   Family History:  His family history includes Cancer in his mother; Diabetes in his mother; Hypertension in his father. There is no history of Colon cancer.   Allergies No Known Allergies   Home Medications  Prior to Admission medications   Medication Sig Start Date End Date Taking? Authorizing Provider  Accu-Chek Softclix Lancets lancets Check blood sugars 2 times a day while on steroids 05/28/20   Agyei, Caprice Kluver, MD  allopurinol (ZYLOPRIM) 100 MG tablet Take 1.5 tablets (150 mg total) by mouth daily. 06/11/2020   Mosetta Anis, MD  amLODipine (NORVASC) 10 MG tablet Take 1 tablet (10 mg total) by mouth daily. 11/03/19   Axel Filler, MD  aspirin EC 81 MG tablet Take 1 tablet (81 mg total) by mouth daily. 11/17/15   Bensimhon, Shaune Pascal, MD  BIKTARVY 50-200-25 MG TABS tablet TAKE 1 TABLET BY MOUTH DAILY Patient taking differently: Take 1 tablet by mouth daily. 05/17/20   Campbell Riches, MD  blood glucose meter kit and supplies KIT Dispense based on patient and insurance preference. Use up to four times daily as directed. (FOR ICD-9 250.00, 250.01). 08/03/15   Emokpae, Ejiroghene E, MD  Blood Glucose Monitoring Suppl  (ACCU-CHEK GUIDE) w/Device KIT Check blood sugar 2 times a day while on steroids 05/28/20   Agyei, Caprice Kluver, MD  cholecalciferol (VITAMIN D) 25 MCG tablet Take 1 tablet (1,000 Units total) by mouth daily. 05/27/20 07/26/20  Lacinda Axon, MD  Continuous Blood Gluc Receiver (FREESTYLE LIBRE 2 READER SYSTM) DEVI 1 Device by Does not apply route 3 (three) times daily. 01/08/19   Jean Rosenthal, MD  Continuous Blood Gluc Sensor (FREESTYLE LIBRE 2 SENSOR SYSTM) MISC 1 Device by Does not apply route 3 (three) times daily. 01/08/19   Agyei, Caprice Kluver, MD  dexamethasone (DECADRON) 4 MG tablet Take 4 mg by mouth See admin instructions. Take 1 tablet two times daily with meals for 14 days.    [provider]  glucose blood (ACCU-CHEK GUIDE) test strip Check blood sugar 2 times per day while on steroids 05/28/20   Agyei, Caprice Kluver, MD  losartan (COZAAR) 50 MG tablet Take 1 tablet (50 mg total) by mouth daily. 03/30/20   Mosetta Anis, MD  metFORMIN (GLUCOPHAGE) 1000 MG tablet Take 1 tablet (1,000 mg total) by mouth 2 (two) times daily with a meal. 06/09/20   Agyei, Caprice Kluver, MD  metoprolol succinate (TOPROL-XL) 50 MG 24 hr tablet TAKE 1 TABLET BY MOUTH DAILY WITH OR IMMEDAITELY FOLLOWING A MEAL Patient taking differently: Take 50 mg by mouth daily. TAKE 1 TABLET BY MOUTH DAILY WITH OR IMMEDAITELY FOLLOWING A MEAL 06/03/20   Agyei, Caprice Kluver, MD  Multiple Vitamin (MULTIVITAMIN WITH MINERALS) TABS tablet Take 1 tablet by mouth daily.    [provider]  nitroGLYCERIN (NITROSTAT) 0.4 MG SL tablet Place 1 tablet (0.4 mg total) under the tongue every 5 (five) minutes as needed for chest pain. 11/17/15   Bensimhon, Shaune Pascal, MD  ondansetron (ZOFRAN) 8 MG tablet Take 1 tablet (8 mg total) by mouth every 8 (eight) hours as needed for refractory nausea / vomiting. Start on day 3 after chemotherapy. 06/01/20  [name], MD  [facility] DELICA LANCETS FINE MISC Check blood sugar up to 1 time a day 12/22/15   Collier Salina, MD  polyethylene glycol powder (GLYCOLAX/MIRALAX) 17 GM/SCOOP powder Take 17 g by mouth daily.    [provider]  prochlorperazine (COMPAZINE) 10 MG tablet Take 1 tablet (10 mg total) by mouth every 6 (six) hours as needed (Nausea or vomiting). 06/01/20   [name], MD  sodium chloride, PF, 0.9 % injection 5 mLs See admin instructions. 5 ml's into drain once a day 05/25/20   [provider]     Critical care time: 60 min     The patient is critically ill with multiple organ systems failure and requires high complexity decision making for assessment and support, frequent evaluation and titration of therapies, application of advanced monitoring technologies and extensive interpretation of multiple databases.  Independent Critical Care Time: 60 Minutes.   Rodman Pickle, M.D. [facility] Pulmonary/Critical Care Medicine 2020/06/19 12:18 AM   Please see Amion for pager number to reach on-call Pulmonary and Critical Care Team.

## 2020-06-19 NOTE — Sepsis Progress Note (Signed)
Notified provider of need to order repeat lactic acid. ° °

## 2020-06-19 NOTE — Progress Notes (Signed)
Pt transported on vent 100% fio2 from ED to CT and back.  Pt suctioned orally and down ett prior to transport.  Pt did well going to CT but brady'd down upon completion of CT scan.  CPR called, ROSC returned and pt transported back to ED room 14 on vent.

## 2020-06-19 NOTE — Progress Notes (Signed)
eLink Physician-Brief Progress Note Patient Name: John Dickerson DOB: August 19, 1960 MRN: [id number]   Date of Service  07/03/2020  HPI/Events of Note  ABG on 100%/PRVC 22/TV 490/P 5 = 6.882/41.2/124/7.3.  eICU Interventions  Plan: 1. Increase PRVC rate to 35. 2. NaHCO3 200 meq IV now.  3. Increase NaHCO3 IV infusion to 150 mL/hour.  4. Repeat ABG at 2:15 AM.        Lenell Antu July 03, 2020, 1:15 AM

## 2020-06-19 NOTE — Progress Notes (Signed)
Pharmacy Antibiotic Note  John Dickerson is a 61 y.o. male admitted on 05/23/2020 with past medical history of HIV, DM type II, HTN, HLD, hepatobiliary cancer .  Pharmacy has been consulted to dose cefepime for sepsis.  Plan: Cefepime 2gm IV q24h Follow renal function and clinical course  Height: 5\' 5"  (165.1 cm) IBW/kg (Calculated) : 61.5  Temp (24hrs), Avg:97.4 F (36.3 C), Min:97.4 F (36.3 C), Max:97.4 F (36.3 C)  Recent Labs  Lab 06/09/20 1029 06/14/20 0912 06/14/20 1136 06/12/2020 1819 05/21/2020 2019  WBC 9.1 11.0* 12.2* 23.1*  --   CREATININE 1.19 1.37* 1.21 3.01*  --   LATICACIDVEN  --   --   --  >11.0* >11.0*    Estimated Creatinine Clearance: 22.4 mL/min (A) (by C-G formula based on SCr of 3.01 mg/dL (H)).    No Known Allergies  Antimicrobials this admission: Cefepime 12/28 >> vanc 12/28 x 1  Dose adjustments this admission:   Microbiology results: 12/28 BCx:     Thank you for allowing pharmacy to be a part of this patients care.  1/29 RPh 2020-07-12, 12:50 AM

## 2020-06-19 NOTE — Progress Notes (Signed)
Second abg with time stamp of 0030 was drawn on 12/29, not 06-20-2020.  Results entered under wrong date, clarified this with lab.  Dr Sommer/elink notified.  Vent settings: PRVC , rr22, 100%, +5peep.  Results for TYNER, CODNER (MRN [id number]) as of 05/20/2020 01:36  Ref. Range 20-Jun-2020 00:30  FIO2 Unknown 100.00  pH, Arterial Latest Ref Range: 7.350 - 7.450  6.882 (LL)  pCO2 arterial Latest Ref Range: 32.0 - 48.0 mmHg 41.2  pO2, Arterial Latest Ref Range: 83.0 - 108.0 mmHg 124 (H)  Acid-base deficit Latest Ref Range: 0.0 - 2.0 mmol/L 24.0 (H)  Bicarbonate Latest Ref Range: 20.0 - 28.0 mmol/L 7.3 (L)  O2 Saturation Latest Units: % 94.7  Patient temperature Unknown 98.6  Allens test (pass/fail) Latest Ref Range: PASS  PASS

## 2020-06-19 NOTE — Progress Notes (Addendum)
Pt asystole, Dr Everardo All at bedside speaking with family.  Pt removed from vent per MD, ETT remains in place at this time.

## 2020-06-19 NOTE — ED Notes (Signed)
Family in room with chaplin

## 2020-06-19 DEATH — deceased

## 2020-06-21 ENCOUNTER — Ambulatory Visit (HOSPITAL_COMMUNITY): Payer: Medicare HMO

## 2020-06-21 ENCOUNTER — Other Ambulatory Visit (HOSPITAL_COMMUNITY): Payer: Medicare HMO

## 2020-06-22 ENCOUNTER — Other Ambulatory Visit: Payer: Medicare HMO

## 2020-06-22 ENCOUNTER — Ambulatory Visit: Payer: Medicare HMO | Admitting: Hematology and Oncology

## 2020-06-23 ENCOUNTER — Ambulatory Visit: Payer: Medicare HMO

## 2020-06-27 DIAGNOSIS — J9601 Acute respiratory failure with hypoxia: Secondary | ICD-10-CM

## 2020-06-27 DIAGNOSIS — E872 Acidosis, unspecified: Secondary | ICD-10-CM

## 2020-06-27 DIAGNOSIS — A419 Sepsis, unspecified organism: Secondary | ICD-10-CM

## 2020-06-27 DIAGNOSIS — R6521 Severe sepsis with septic shock: Secondary | ICD-10-CM

## 2020-07-20 NOTE — Death Summary Note (Signed)
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 year old male who presented with weakness and confusion. He was seen in the ED on 12/27 for hyperglycemia and discharged home. On 07/05/2020 he returned to the ED for worsening abdominal pain and altered mental status. In the ED labs were significant for lactic acidosis. He developed bradycardia and PEA arrest. CPR performed x 3. PCCM consulted for admission. He was started on vasopressor support for septic and possible cardiogenic shock. He required intubation during arrest for critical illness.  Critical care team discussed goals of care with wife at bedside including his critical condition. After three cardiac arrests, his prognosis is grim and is currently receiving aggressive life support. Decision was made to withdraw care. Chaplain called. Patient expired at 1:24 AM on Jul 06, 2020.   Pertinent Labs and Studies  Significant Diagnostic Studies CT ABDOMEN PELVIS WO CONTRAST  Result Date: 07-06-2020 CLINICAL DATA:  Altered mental status, hepatic malignancy undergoing chemotherapy, cardiopulmonary arrest EXAM: CT HEAD WITHOUT CONTRAST CT ABDOMEN AND PELVIS WITHOUT  CONTRAST TECHNIQUE: Contiguous axial images were obtained from the base of the skull through the vertex without intravenous contrast. Multidetector CT imaging of the abdomen and pelvis was performed following the standard protocol without IV contrast. COMPARISON:  None. FINDINGS: CT HEAD FINDINGS Brain: Normal anatomic configuration. Parenchymal volume loss is commensurate with the patient's 60. Mild periventricular white matter changes are present likely reflecting the sequela of small vessel ischemia. No abnormal intra or extra-axial mass lesion or fluid collection. No abnormal mass effect or midline shift. No evidence of acute intracranial hemorrhage or infarct. Ventricular size is normal. Cerebellum unremarkable. Vascular: No asymmetric hyperdense vasculature at the skull base. Skull: Intact Sinuses/Orbits: There is dense opacification of the sphenoid sinuses with hyperostosis involving the walls of the sinus in keeping with probable chronic opacification. There is opacification of several ethmoid air cells bilaterally as well as mild mucosal thickening involving the visualized maxillary and frontal sinuses. The orbits are unremarkable. Other: Mastoid air cells and middle ear cavities are clear. CT ABDOMEN AND PELVIS FINDINGS Lower chest: Interlobular septal thickening at the lung bases in keeping with at least mild interstitial pulmonary edema. There is left lower lobe consolidation possibly representing changes of acute lobar pneumonia or aspiration in the acute setting. This is incompletely evaluated on this examination. No pleural effusion. Extensive multi-vessel coronary artery calcification noted. Mild global cardiomegaly. Hypoattenuation of the cardiac blood pool is seen in keeping with changes of at least moderate anemia. Hepatobiliary: Since the prior examination, there has developed extensive portal venous gas seen throughout the liver. There is extensive intraparenchymal gas seen throughout the  parenchyma of the right hepatic lobe as well as within the a parenchyma of the a central malignancy previously noted within the  right hepatic lobe. Dual internal biliary stents are again identified. There is no intra or extrahepatic biliary ductal dilation. A percutaneous cholecystostomy drainage catheter is in place, however, the catheter has partially uncoiled and, while the tip communicates with the gallbladder lumen, multiple side holes are seen within the intervening parenchyma of the right hepatic lobe. Finally, within the right upper quadrant, there is obliteration of the fat plane separating the hepatic flexure of the colon with the inferior right hepatic lobe and, on prior examination, there is evidence of malignancy in this region. Together, the findings may reflect direct invasion of the colon with communication between the colonic lumen and the hepatic parenchyma/neoplasm. Pancreas: Unremarkable Spleen: Unremarkable Adrenals/Urinary tract: Foley catheter balloon seen within the decompressed bladder lumen. Adrenal glands and kidneys are unremarkable. Stomach/Bowel: As noted previously, there is extensive infiltration in the region of the hepatic flexure of the colon with obliteration of fat plane separating the structure from the inferior right hepatic lobe in an area of malignancy seen on prior examination. There is extensive gas noted within the a superior mesenteric vein and portal splenic confluence. Additionally, there is a trace amount of gas is seen within the a right colic vein (axial image # 45) suggesting loss of colonic wall integrity in this region. The stomach, small bowel, and large bowel are otherwise unremarkable. No evidence of obstruction. No free intraperitoneal gas or fluid. Vascular/Lymphatic: Moderate aortoiliac atherosclerotic calcification. No aortic aneurysm. No pathologic adenopathy within the abdomen and pelvis. Reproductive: Prostate is unremarkable. Other: Tiny fat containing  left inguinal hernia. Rectum unremarkable. Tiny fat containing umbilical hernia. Musculoskeletal: No acute bone abnormality. No lytic or blastic bone lesion. IMPRESSION: No acute intracranial abnormality. Extensive paranasal sinus disease with probable chronic opacification of the sphenoid sinuses bilaterally. Left lower lobe consolidation, possibly reflecting changes of acute lobar pneumonia in the appropriate clinical setting. Mild interstitial pulmonary edema. Extensive intraparenchymal gas and portal venous gas throughout the right hepatic lobe and liver, respectively. Superior mesenteric venous gas as well as trace gas within the right colic vein suggesting loss of integrity of the colonic wall within the hepatic flexure. Possible direct communication between the colon and right hepatic lobe in this region, possibly contributing to extensive gas throughout the right hepatic lobe and within the known central neoplasm within this region. Gallbladder drain is partially withdrawn with sideholes position within the right hepatic parenchyma. A ball-valve effect from aggressive flushing could also potentially result in extensive intraparenchymal gas within the right hepatic lobe and correlation for appropriate drain function is recommended. Finally, it is conceivable that a ball valve mechanism through the existing internal biliary stents could contribute to accumulation of gas within the right hepatic parenchyma ultimately resulting in dissection of gas into the portal venous system, though this would not account for gas seen within the mesenteric venous system. Repeat examination with contrast enhancement, with the patient is clinically able to do so, may be more helpful to assess the integrity of the bowel wall and potential communication between and the colon and hepatic parenchyma. These results were called by telephone at the time of interpretation on 07/12/20 at 12:27 am to provider [facility] , who  verbally acknowledged these results. Electronically Signed   By: Fidela Salisbury MD   On: 07/12/20 00:50   DG Chest 2 View  Result Date: 05/19/2020 CLINICAL DATA:  Shortness of breath EXAM: CHEST - 2 VIEW COMPARISON:  09/13/2005, CT 05/27/2020 FINDINGS: No focal opacity or pleural effusion. Normal cardiomediastinal silhouette.  Abnormal lucency in the right upper quadrant with slight branching configuration. IMPRESSION: 1. No active cardiopulmonary disease. 2. Branching lucencies in the right upper quadrant are indeterminate for portal venous versus pneumobilia (known biliary drains/stents); consider CT for further evaluation Critical Value/emergent results were called by telephone at the time of interpretation on Jul 14, 2020 at 7:03 pm to provider MATTHEW TRIFAN , who verbally acknowledged these results. Electronically Signed   By: Donavan Foil M.D.   On: 2020-07-14 19:03   CT Head Wo Contrast  Result Date: 05/25/2020 CLINICAL DATA:  Altered mental status, hepatic malignancy undergoing chemotherapy, cardiopulmonary arrest EXAM: CT HEAD WITHOUT CONTRAST CT ABDOMEN AND PELVIS WITHOUT CONTRAST TECHNIQUE: Contiguous axial images were obtained from the base of the skull through the vertex without intravenous contrast. Multidetector CT imaging of the abdomen and pelvis was performed following the standard protocol without IV contrast. COMPARISON:  None. FINDINGS: CT HEAD FINDINGS Brain: Normal anatomic configuration. Parenchymal volume loss is commensurate with the patient's 60. Mild periventricular white matter changes are present likely reflecting the sequela of small vessel ischemia. No abnormal intra or extra-axial mass lesion or fluid collection. No abnormal mass effect or midline shift. No evidence of acute intracranial hemorrhage or infarct. Ventricular size is normal. Cerebellum unremarkable. Vascular: No asymmetric hyperdense vasculature at the skull base. Skull: Intact Sinuses/Orbits: There is dense  opacification of the sphenoid sinuses with hyperostosis involving the walls of the sinus in keeping with probable chronic opacification. There is opacification of several ethmoid air cells bilaterally as well as mild mucosal thickening involving the visualized maxillary and frontal sinuses. The orbits are unremarkable. Other: Mastoid air cells and middle ear cavities are clear. CT ABDOMEN AND PELVIS FINDINGS Lower chest: Interlobular septal thickening at the lung bases in keeping with at least mild interstitial pulmonary edema. There is left lower lobe consolidation possibly representing changes of acute lobar pneumonia or aspiration in the acute setting. This is incompletely evaluated on this examination. No pleural effusion. Extensive multi-vessel coronary artery calcification noted. Mild global cardiomegaly. Hypoattenuation of the cardiac blood pool is seen in keeping with changes of at least moderate anemia. Hepatobiliary: Since the prior examination, there has developed extensive portal venous gas seen throughout the liver. There is extensive intraparenchymal gas seen throughout the parenchyma of the right hepatic lobe as well as within the a parenchyma of the a central malignancy previously noted within the right hepatic lobe. Dual internal biliary stents are again identified. There is no intra or extrahepatic biliary ductal dilation. A percutaneous cholecystostomy drainage catheter is in place, however, the catheter has partially uncoiled and, while the tip communicates with the gallbladder lumen, multiple side holes are seen within the intervening parenchyma of the right hepatic lobe. Finally, within the right upper quadrant, there is obliteration of the fat plane separating the hepatic flexure of the colon with the inferior right hepatic lobe and, on prior examination, there is evidence of malignancy in this region. Together, the findings may reflect direct invasion of the colon with communication between  the colonic lumen and the hepatic parenchyma/neoplasm. Pancreas: Unremarkable Spleen: Unremarkable Adrenals/Urinary tract: Foley catheter balloon seen within the decompressed bladder lumen. Adrenal glands and kidneys are unremarkable. Stomach/Bowel: As noted previously, there is extensive infiltration in the region of the hepatic flexure of the colon with obliteration of fat plane separating the structure from the inferior right hepatic lobe in an area of malignancy seen on prior examination. There is extensive gas noted within the a superior mesenteric vein and portal  splenic confluence. Additionally, there is a trace amount of gas is seen within the a right colic vein (axial image # 45) suggesting loss of colonic wall integrity in this region. The stomach, small bowel, and large bowel are otherwise unremarkable. No evidence of obstruction. No free intraperitoneal gas or fluid. Vascular/Lymphatic: Moderate aortoiliac atherosclerotic calcification. No aortic aneurysm. No pathologic adenopathy within the abdomen and pelvis. Reproductive: Prostate is unremarkable. Other: Tiny fat containing left inguinal hernia. Rectum unremarkable. Tiny fat containing umbilical hernia. Musculoskeletal: No acute bone abnormality. No lytic or blastic bone lesion. IMPRESSION: No acute intracranial abnormality. Extensive paranasal sinus disease with probable chronic opacification of the sphenoid sinuses bilaterally. Left lower lobe consolidation, possibly reflecting changes of acute lobar pneumonia in the appropriate clinical setting. Mild interstitial pulmonary edema. Extensive intraparenchymal gas and portal venous gas throughout the right hepatic lobe and liver, respectively. Superior mesenteric venous gas as well as trace gas within the right colic vein suggesting loss of integrity of the colonic wall within the hepatic flexure. Possible direct communication between the colon and right hepatic lobe in this region, possibly  contributing to extensive gas throughout the right hepatic lobe and within the known central neoplasm within this region. Gallbladder drain is partially withdrawn with sideholes position within the right hepatic parenchyma. A ball-valve effect from aggressive flushing could also potentially result in extensive intraparenchymal gas within the right hepatic lobe and correlation for appropriate drain function is recommended. Finally, it is conceivable that a ball valve mechanism through the existing internal biliary stents could contribute to accumulation of gas within the right hepatic parenchyma ultimately resulting in dissection of gas into the portal venous system, though this would not account for gas seen within the mesenteric venous system. Repeat examination with contrast enhancement, with the patient is clinically able to do so, may be more helpful to assess the integrity of the bowel wall and potential communication between and the colon and hepatic parenchyma. These results were called by telephone at the time of interpretation on June 27, 2020 at 12:27 am to provider [facility] , who verbally acknowledged these results. Electronically Signed   By: Fidela Salisbury MD   On: Jun 27, 2020 00:50   DG Chest Portable 1 View  Result Date: 06/05/2020 CLINICAL DATA:  Intubation and central line placement. EXAM: PORTABLE CHEST 1 VIEW COMPARISON:  Radiograph earlier today. FINDINGS: Endotracheal tube tip 5.8 cm from the carina at the thoracic inlet. Right internal jugular central venous catheter tip in the mid SVC. No pneumothorax. Low lung volumes with upper normal heart size. No large pleural effusion. Developing vascular congestion. Stable branching lucencies in the right upper quadrant. IMPRESSION: 1. Endotracheal tube tip 5.8 cm from the carina at the thoracic inlet. 2. Right internal jugular central venous catheter tip in the mid SVC. No pneumothorax. 3. Low lung volumes with developing vascular congestion.  Electronically Signed   By: Keith Rake M.D.   On: 06/14/2020 21:25   IR EXCHANGE BILIARY DRAIN  Result Date: 06/10/2020 INDICATION: Hepatic biliary cancer, chronic indwelling cholecystostomy EXAM: Cholangiogram through existing cholecystostomy 10 French cholecystostomy exchange MEDICATIONS: 1% lidocaine local ANESTHESIA/SEDATION: Moderate Sedation Time: None. The patient's level of consciousness and vital signs were monitored continuously by radiology nursing throughout the procedure under my direct supervision. FLUOROSCOPY TIME:  Fluoroscopy Time: 1 minutes 0 seconds (13 mGy). COMPLICATIONS: None. PROCEDURE: Informed written consent was obtained from the patient after a thorough discussion of the procedural risks, benefits and alternatives. All questions were addressed. Maximal Sterile Barrier Technique  was utilized including caps, mask, sterile gowns, sterile gloves, sterile drape, hand hygiene and skin antiseptic. A timeout was performed prior to the initiation of the procedure. Under sterile conditions, the existing cholecystostomy was injected with contrast. This confirms continued obstruction of the cystic duct. Gallbladder is collapsed. Cholecystostomy was exchanged over a stiff Glidewire. Retention loop formed in the gallbladder. Contrast injection reconfirms position. Catheter secured with Prolene suture and connected to external gravity drainage bag. Sterile dressing applied. No immediate complication. Patient tolerated the procedure well. IMPRESSION: Cholangiogram continues to demonstrate cystic duct obstruction. Uncomplicated exchange of the 10 French cholecystostomy. Catheter should remain to gravity drain62. Electronically Signed   By: Jerilynn Mages.  Shick M.D.   On: 06/10/2020 15:29    Microbiology No results found for this or any previous visit (from the past 240 hour(s)).  Lab Basic Metabolic Panel: No results for input(s): NA, K, CL, CO2, GLUCOSE, BUN, CREATININE, CALCIUM, MG, PHOS in the  last 168 hours. Liver Function Tests: No results for input(s): AST, ALT, ALKPHOS, BILITOT, PROT, ALBUMIN in the last 168 hours. No results for input(s): LIPASE, AMYLASE in the last 168 hours. No results for input(s): AMMONIA in the last 168 hours. CBC: No results for input(s): WBC, NEUTROABS, HGB, HCT, MCV, PLT in the last 168 hours. Cardiac Enzymes: No results for input(s): CKTOTAL, CKMB, CKMBINDEX, TROPONINI in the last 168 hours. Sepsis Labs: No results for input(s): PROCALCITON, WBC, LATICACIDVEN in the last 168 hours.  Procedures/Operations  12/28 ETT 12/28 CVC 12/28 ACLS x 3   Kylinn Shropshire Rodman Pickle 06/27/2020, 9:30 AM

## 2020-07-22 ENCOUNTER — Other Ambulatory Visit (HOSPITAL_COMMUNITY): Payer: Medicare HMO

## 2021-03-30 IMAGING — MR MR ABDOMEN WO/W CM MRCP
18 of 22 series · 43 of 48 positions shown · IV contrast (gadavist)
Comparison: CT abdomen and pelvis from Tuesday February, 2020

CLINICAL DATA: Painless jaundice in a 59-year-old male

EXAM:
MRI ABDOMEN WITHOUT AND WITH CONTRAST (INCLUDING MRCP)
TECHNIQUE: Multiplanar multisequence MR imaging of the abdomen was performed
both before and after the administration of intravenous contrast.
Heavily T2-weighted images of the biliary and pancreatic ducts were
obtained, and three-dimensional MRCP images were rendered by post
processing.
CONTRAST:  6.5mL GADAVIST GADOBUTROL 1 MMOL/ML IV SOLN

[Series 5: cor haste · coronal · 6.0mm · 1.25mm/px · 1 of 35 slices shown]
[im 1/35]
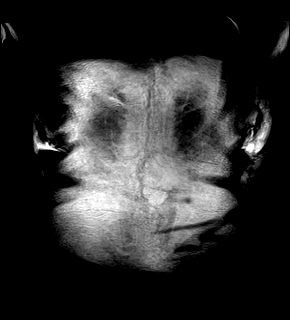

[Series 6: ax haste · axial · 6.0mm · 1.19mm/px · 1 of 34 slices shown]
[im 1/34]
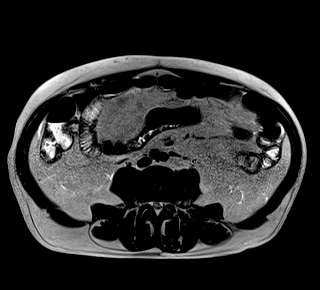

[Series 9: T2 fat-sat · axial · 6.0mm · 1.19mm/px · 1 of 35 slices shown]
[im 1/35]
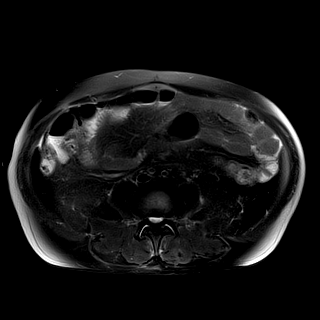

[Series 10: DWI · axial · 6.0mm · 1.42mm/px · z∈[-220,+25]mm · 3 of 105 slices shown (1 of 2)]
[im 1/105]
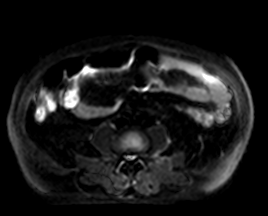
[im 53/105]
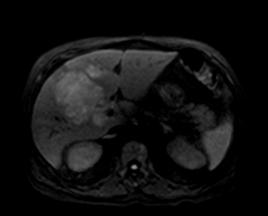
[im 105/105]
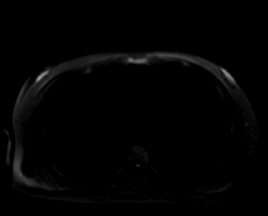

[Series 11: DWI · axial · 6.0mm · 1.42mm/px · 1 of 35 slices shown (2 of 2)]
[im 1/35]
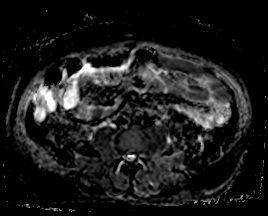

[Series 12: ax in and · axial · 3.0mm · 1.19mm/px · z∈[-234,+27]mm · 2 of 88 slices shown (1 of 2)]
[im 1/88]
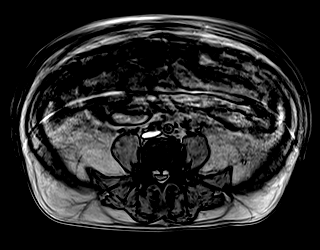
[im 88/88]
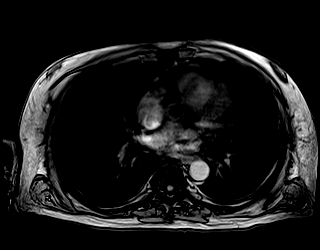

[Series 12: ax in and · axial · 3.0mm · 1.19mm/px · z∈[-234,+27]mm · 3 of 88 slices shown (2 of 2)]
[im 1/88]
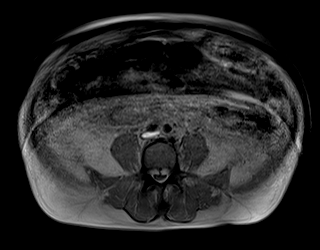
[im 44/88]
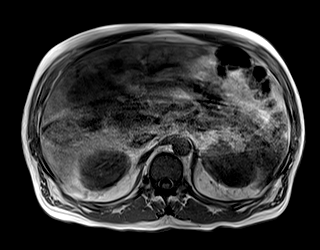
[im 88/88]
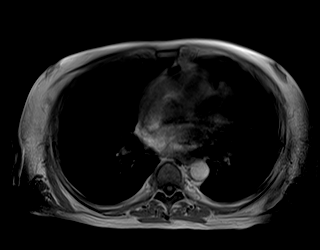

[Series 25: MRCP · coronal · 4.0mm · 1.12mm/px · 1 of 15 slices shown]
[im 1/15]
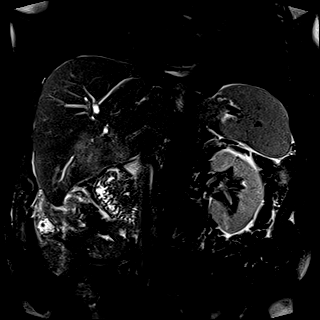

[Series 26: T1 dynamic · axial · non-contrast · 3.0mm · 1.19mm/px · z∈[-234,+27]mm · 3 of 88 slices shown (1 of 5)]
[im 1/88]
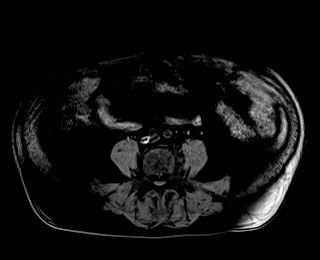
[im 44/88]
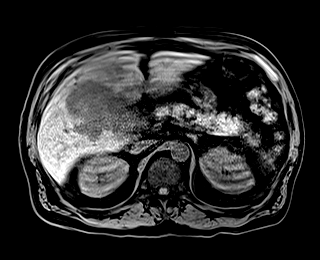
[im 88/88]
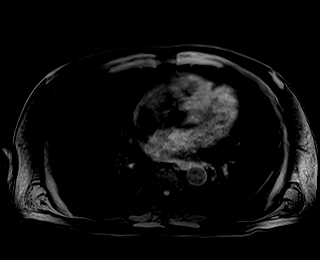

[Series 28: T1 dynamic post-contrast · axial · 3.0mm · 1.19mm/px · z∈[-234,+27]mm · 3 of 88 slices shown (1 of 5)]
[im 1/88]
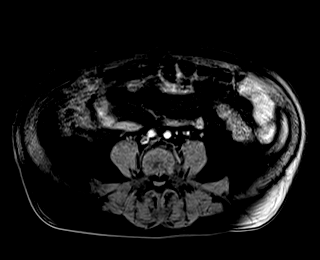
[im 44/88]
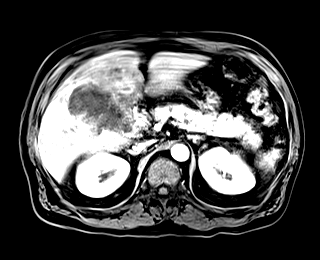
[im 88/88]
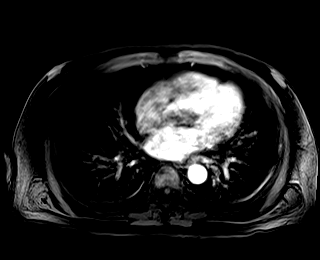

[Series 29: T1 dynamic · axial · 3.0mm · 1.19mm/px · z∈[-234,+27]mm · 3 of 88 slices shown (2 of 5)]
[im 1/88]
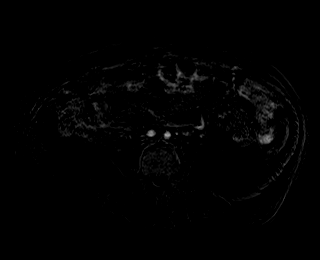
[im 44/88]
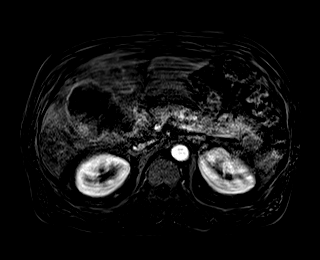
[im 88/88]
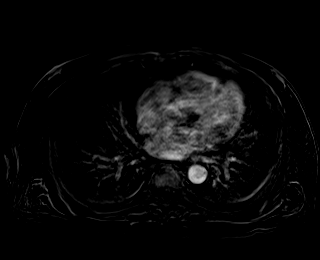

[Series 30: T1 dynamic post-contrast · axial · 3.0mm · 1.19mm/px · z∈[-234,+27]mm · 3 of 88 slices shown (2 of 5)]
[im 1/88]
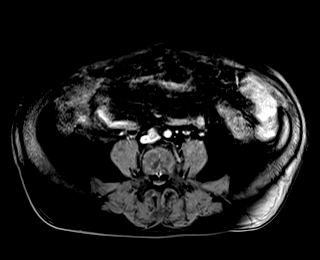
[im 44/88]
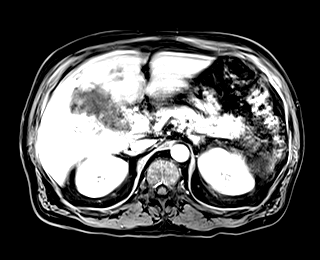
[im 88/88]
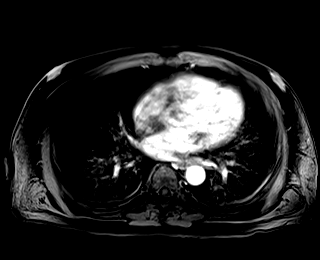

[Series 31: T1 dynamic · axial · 3.0mm · 1.19mm/px · z∈[-234,+27]mm · 3 of 88 slices shown (3 of 5)]
[im 1/88]
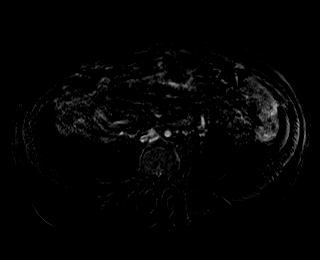
[im 44/88]
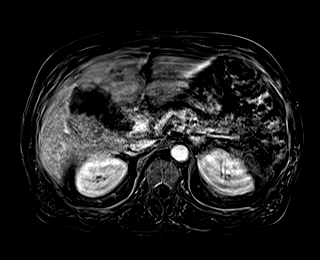
[im 88/88]
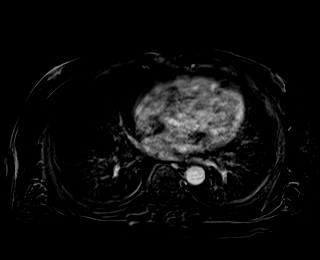

[Series 32: T1 dynamic post-contrast · axial · 3.0mm · 1.19mm/px · z∈[-234,+27]mm · 3 of 88 slices shown (3 of 5)]
[im 1/88]
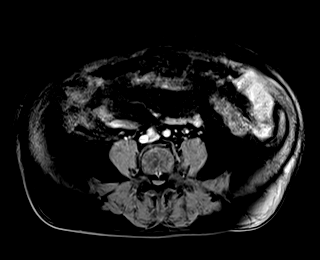
[im 44/88]
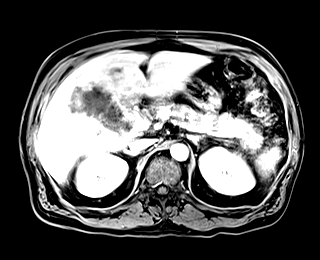
[im 88/88]
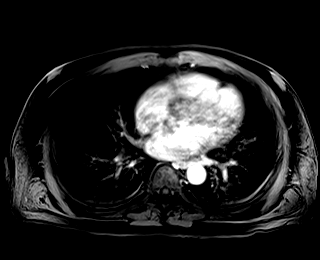

[Series 33: T1 dynamic · axial · 3.0mm · 1.19mm/px · z∈[-234,+27]mm · 3 of 88 slices shown (4 of 5)]
[im 1/88]
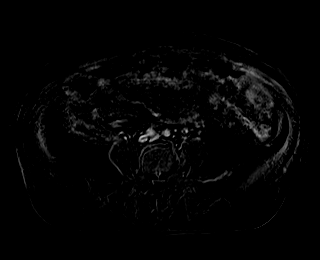
[im 44/88]
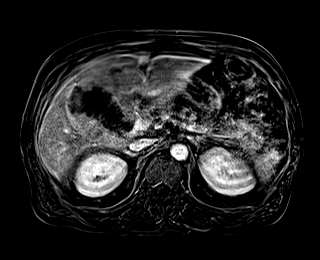
[im 88/88]
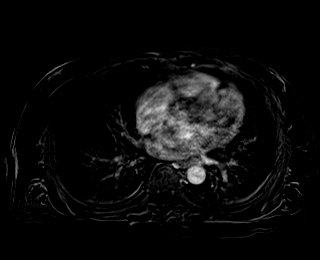

[Series 34: T1 dynamic post-contrast · axial · 3.0mm · 1.19mm/px · z∈[-234,+27]mm · 3 of 88 slices shown (4 of 5)]
[im 1/88]
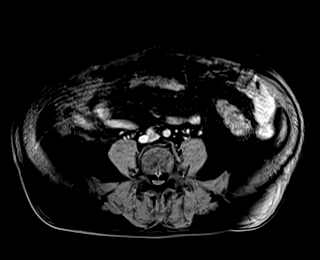
[im 44/88]
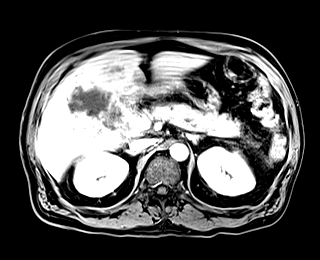
[im 88/88]
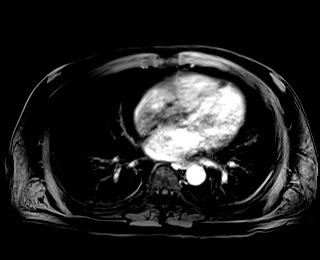

[Series 35: T1 dynamic · axial · 3.0mm · 1.19mm/px · z∈[-234,+27]mm · 3 of 88 slices shown (5 of 5)]
[im 1/88]
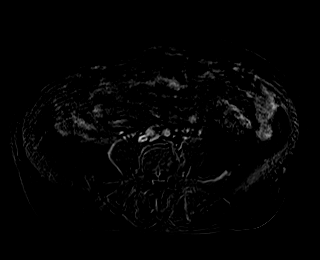
[im 44/88]
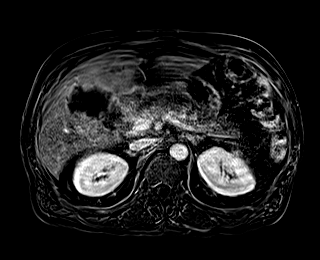
[im 88/88]
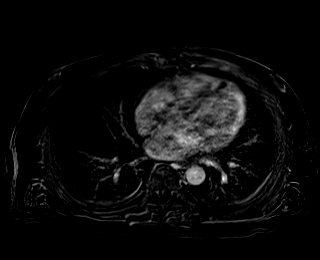

[Series 36: T1 dynamic post-contrast · coronal · 3.0mm · 1.31mm/px · 3 of 80 slices shown (5 of 5)]
[im 1/80]
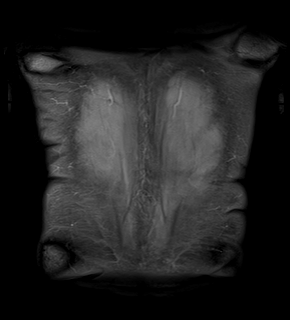
[im 40/80]
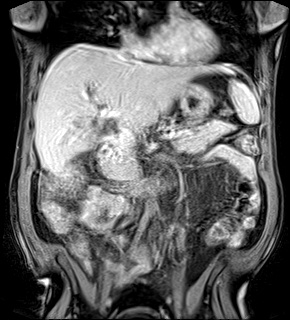
[im 80/80]
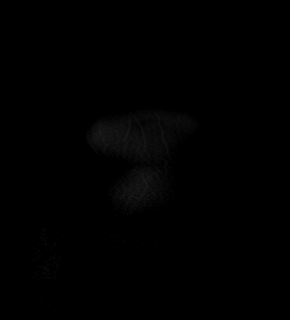

[43 of 48 positions shown; findings below may reference images not displayed]

FINDINGS: Lower chest: Lung bases are clear, no consolidation or sign of
pleural effusion. Limited assessment of lung bases on MRI.

Hepatobiliary: Masslike appearance of the liver and a portion of the
gallbladder. Obstruction of the biliary tree at the porta hepatis.
Is soft tissue with enhancement extends to the porta hepatis
obstructing the biliary tree. And irregular masslike area with
irregular peripheral enhancement extends from the gallbladder fossa
into the porta hepatis where the bile duct shows transition. Biliary
drainage catheter remains in place. This area measures 7.9 x 6.7 cm
in greatest axial dimension with another area in hepatic subsegment
IV that measures 2.9 x 2.3 cm. Enhancement along the margin
posteriorly with soft tissue measuring 3.5 x 2.3 cm on image 46 of
series 30. Profound biliary duct distension. T1 signal with mild
mixed signal in the area and nonenhancing central portion of this
large area extending into the central liver.

Portal vein is narrowed at the biliary confluence. This is near the
site of biliary duct transition no additional hepatic lesion. Frank
enhancing soft tissue just above the pancreatic head is seen on
image 48 of series 32

Pancreas:  No sign of pancreatic inflammation or ductal dilation.

Spleen:  Normal

Adrenals/Urinary Tract: Adrenal glands are normal. Kidneys with
smooth contours. No hydronephrosis.

Stomach/Bowel: Gastrointestinal tract without acute process. No
acute gastrointestinal process to the extent evaluated. Limited
assessment on MRI.

Vascular/Lymphatic: Normal caliber abdominal aorta. There is no
gastrohepatic or hepatoduodenal ligament lymphadenopathy. No
retroperitoneal or mesenteric lymphadenopathy.

Other:  Small volume ascites about the liver.

Musculoskeletal: No acute musculoskeletal process. No destructive
bone finding on limited assessment.
IMPRESSION: 1. Masslike area in the central liver obstructing bile ducts, common
hepatic duct and narrowing the portal vein extending from the
gallbladder fossa a highly suspicious for aggressive biliary
neoplasm/cholangiocarcinoma/gallbladder carcinoma. Superimposed
infection is also possible.
2. Pancreas is largely normal but inseparable from enhancing soft
tissue that extends from the porta hepatis inferiorly and with
irregular enhancement that surrounds an area of presumed necrotic
tumor and or infected necrotic debris.
3. Separate smaller area showing heterogeneous enhancement likely
reflects additional site of tumor in the liver in the inferior
medial LEFT hepatic lobe.
4. Small volume ascites about the liver.
5. Biliary drainage tube appears to remain in place, not well
evaluated except on MRCP sequences were a can be faintly seen.
